# Patient Record
Sex: Female | Born: 1945 | Race: White | Hispanic: No | Marital: Married | Smoking: Never smoker
Health system: Southern US, Community
[De-identification: ages and names within clinical notes are randomized; demographics above are authoritative.]

## PROBLEM LIST (undated history)

## (undated) DIAGNOSIS — D649 Anemia, unspecified: Secondary | ICD-10-CM

## (undated) DIAGNOSIS — Z9889 Other specified postprocedural states: Secondary | ICD-10-CM

## (undated) DIAGNOSIS — Z9221 Personal history of antineoplastic chemotherapy: Secondary | ICD-10-CM

## (undated) DIAGNOSIS — Z8042 Family history of malignant neoplasm of prostate: Secondary | ICD-10-CM

## (undated) DIAGNOSIS — E785 Hyperlipidemia, unspecified: Secondary | ICD-10-CM

## (undated) DIAGNOSIS — I10 Essential (primary) hypertension: Secondary | ICD-10-CM

## (undated) DIAGNOSIS — Z923 Personal history of irradiation: Secondary | ICD-10-CM

## (undated) DIAGNOSIS — Z8049 Family history of malignant neoplasm of other genital organs: Secondary | ICD-10-CM

## (undated) DIAGNOSIS — R112 Nausea with vomiting, unspecified: Secondary | ICD-10-CM

## (undated) DIAGNOSIS — C801 Malignant (primary) neoplasm, unspecified: Secondary | ICD-10-CM

## (undated) HISTORY — PX: BREAST LUMPECTOMY: SHX2

## (undated) HISTORY — PX: DILATION AND CURETTAGE OF UTERUS: SHX78

## (undated) HISTORY — PX: BREAST BIOPSY: SHX20

## (undated) HISTORY — PX: MASTECTOMY: SHX3

## (undated) HISTORY — DX: Family history of malignant neoplasm of other genital organs: Z80.49

## (undated) HISTORY — DX: Family history of malignant neoplasm of prostate: Z80.42

---

## 2013-07-01 DIAGNOSIS — H04129 Dry eye syndrome of unspecified lacrimal gland: Secondary | ICD-10-CM | POA: Diagnosis not present

## 2013-07-01 DIAGNOSIS — H00029 Hordeolum internum unspecified eye, unspecified eyelid: Secondary | ICD-10-CM | POA: Diagnosis not present

## 2013-07-20 DIAGNOSIS — H04129 Dry eye syndrome of unspecified lacrimal gland: Secondary | ICD-10-CM | POA: Diagnosis not present

## 2013-07-20 DIAGNOSIS — H00029 Hordeolum internum unspecified eye, unspecified eyelid: Secondary | ICD-10-CM | POA: Diagnosis not present

## 2013-07-20 DIAGNOSIS — H1130 Conjunctival hemorrhage, unspecified eye: Secondary | ICD-10-CM | POA: Diagnosis not present

## 2013-07-27 DIAGNOSIS — H04129 Dry eye syndrome of unspecified lacrimal gland: Secondary | ICD-10-CM | POA: Diagnosis not present

## 2013-07-27 DIAGNOSIS — H1045 Other chronic allergic conjunctivitis: Secondary | ICD-10-CM | POA: Diagnosis not present

## 2013-07-27 DIAGNOSIS — H00029 Hordeolum internum unspecified eye, unspecified eyelid: Secondary | ICD-10-CM | POA: Diagnosis not present

## 2013-08-25 DIAGNOSIS — H04129 Dry eye syndrome of unspecified lacrimal gland: Secondary | ICD-10-CM | POA: Diagnosis not present

## 2013-08-25 DIAGNOSIS — H1045 Other chronic allergic conjunctivitis: Secondary | ICD-10-CM | POA: Diagnosis not present

## 2013-09-21 DIAGNOSIS — Z124 Encounter for screening for malignant neoplasm of cervix: Secondary | ICD-10-CM | POA: Diagnosis not present

## 2013-09-21 DIAGNOSIS — I10 Essential (primary) hypertension: Secondary | ICD-10-CM | POA: Diagnosis not present

## 2013-09-21 DIAGNOSIS — H5789 Other specified disorders of eye and adnexa: Secondary | ICD-10-CM | POA: Diagnosis not present

## 2013-09-21 DIAGNOSIS — E785 Hyperlipidemia, unspecified: Secondary | ICD-10-CM | POA: Diagnosis not present

## 2013-09-21 DIAGNOSIS — Z01419 Encounter for gynecological examination (general) (routine) without abnormal findings: Secondary | ICD-10-CM | POA: Diagnosis not present

## 2013-09-21 DIAGNOSIS — Z780 Asymptomatic menopausal state: Secondary | ICD-10-CM | POA: Diagnosis not present

## 2013-09-21 DIAGNOSIS — Z1231 Encounter for screening mammogram for malignant neoplasm of breast: Secondary | ICD-10-CM | POA: Diagnosis not present

## 2013-10-07 DIAGNOSIS — Z1212 Encounter for screening for malignant neoplasm of rectum: Secondary | ICD-10-CM | POA: Diagnosis not present

## 2013-11-06 DIAGNOSIS — Z780 Asymptomatic menopausal state: Secondary | ICD-10-CM | POA: Diagnosis not present

## 2013-11-06 DIAGNOSIS — Z1231 Encounter for screening mammogram for malignant neoplasm of breast: Secondary | ICD-10-CM | POA: Diagnosis not present

## 2013-11-06 DIAGNOSIS — H5789 Other specified disorders of eye and adnexa: Secondary | ICD-10-CM | POA: Diagnosis not present

## 2014-08-11 DIAGNOSIS — Z0000 Encounter for general adult medical examination without abnormal findings: Secondary | ICD-10-CM | POA: Diagnosis not present

## 2014-08-11 DIAGNOSIS — Z23 Encounter for immunization: Secondary | ICD-10-CM | POA: Diagnosis not present

## 2014-08-11 DIAGNOSIS — Z1159 Encounter for screening for other viral diseases: Secondary | ICD-10-CM | POA: Diagnosis not present

## 2014-08-11 DIAGNOSIS — I10 Essential (primary) hypertension: Secondary | ICD-10-CM | POA: Diagnosis not present

## 2014-08-11 DIAGNOSIS — E785 Hyperlipidemia, unspecified: Secondary | ICD-10-CM | POA: Diagnosis not present

## 2014-10-06 DIAGNOSIS — R202 Paresthesia of skin: Secondary | ICD-10-CM | POA: Diagnosis not present

## 2014-10-06 DIAGNOSIS — M5412 Radiculopathy, cervical region: Secondary | ICD-10-CM | POA: Diagnosis not present

## 2015-02-09 DIAGNOSIS — J301 Allergic rhinitis due to pollen: Secondary | ICD-10-CM | POA: Diagnosis not present

## 2015-02-09 DIAGNOSIS — I10 Essential (primary) hypertension: Secondary | ICD-10-CM | POA: Diagnosis not present

## 2015-02-09 DIAGNOSIS — E785 Hyperlipidemia, unspecified: Secondary | ICD-10-CM | POA: Diagnosis not present

## 2015-04-19 DIAGNOSIS — H524 Presbyopia: Secondary | ICD-10-CM | POA: Diagnosis not present

## 2015-04-19 DIAGNOSIS — H04123 Dry eye syndrome of bilateral lacrimal glands: Secondary | ICD-10-CM | POA: Diagnosis not present

## 2015-04-19 DIAGNOSIS — H35372 Puckering of macula, left eye: Secondary | ICD-10-CM | POA: Diagnosis not present

## 2015-04-19 DIAGNOSIS — Z961 Presence of intraocular lens: Secondary | ICD-10-CM | POA: Diagnosis not present

## 2015-08-12 DIAGNOSIS — J301 Allergic rhinitis due to pollen: Secondary | ICD-10-CM | POA: Diagnosis not present

## 2015-08-12 DIAGNOSIS — I10 Essential (primary) hypertension: Secondary | ICD-10-CM | POA: Diagnosis not present

## 2015-08-12 DIAGNOSIS — E785 Hyperlipidemia, unspecified: Secondary | ICD-10-CM | POA: Diagnosis not present

## 2016-02-20 DIAGNOSIS — Z23 Encounter for immunization: Secondary | ICD-10-CM | POA: Diagnosis not present

## 2016-02-20 DIAGNOSIS — E785 Hyperlipidemia, unspecified: Secondary | ICD-10-CM | POA: Diagnosis not present

## 2016-02-20 DIAGNOSIS — J301 Allergic rhinitis due to pollen: Secondary | ICD-10-CM | POA: Diagnosis not present

## 2016-02-20 DIAGNOSIS — I10 Essential (primary) hypertension: Secondary | ICD-10-CM | POA: Diagnosis not present

## 2016-02-20 DIAGNOSIS — R739 Hyperglycemia, unspecified: Secondary | ICD-10-CM | POA: Diagnosis not present

## 2016-02-20 DIAGNOSIS — Z1239 Encounter for other screening for malignant neoplasm of breast: Secondary | ICD-10-CM | POA: Diagnosis not present

## 2016-02-20 DIAGNOSIS — Z1211 Encounter for screening for malignant neoplasm of colon: Secondary | ICD-10-CM | POA: Diagnosis not present

## 2016-02-22 ENCOUNTER — Other Ambulatory Visit: Payer: Self-pay | Admitting: Family Medicine

## 2016-02-22 DIAGNOSIS — Z1231 Encounter for screening mammogram for malignant neoplasm of breast: Secondary | ICD-10-CM

## 2016-04-24 DIAGNOSIS — H35372 Puckering of macula, left eye: Secondary | ICD-10-CM | POA: Diagnosis not present

## 2016-04-24 DIAGNOSIS — H04123 Dry eye syndrome of bilateral lacrimal glands: Secondary | ICD-10-CM | POA: Diagnosis not present

## 2016-04-24 DIAGNOSIS — H43811 Vitreous degeneration, right eye: Secondary | ICD-10-CM | POA: Diagnosis not present

## 2016-04-24 DIAGNOSIS — Z961 Presence of intraocular lens: Secondary | ICD-10-CM | POA: Diagnosis not present

## 2016-05-02 ENCOUNTER — Ambulatory Visit
Admission: RE | Admit: 2016-05-02 | Discharge: 2016-05-02 | Disposition: A | Discharge status: Home / Self Care | Payer: Medicare Other | Source: Ambulatory Visit | Attending: Family Medicine | Admitting: Family Medicine

## 2016-05-02 DIAGNOSIS — Z1231 Encounter for screening mammogram for malignant neoplasm of breast: Secondary | ICD-10-CM | POA: Diagnosis not present

## 2016-07-26 DIAGNOSIS — Z1211 Encounter for screening for malignant neoplasm of colon: Secondary | ICD-10-CM | POA: Diagnosis not present

## 2016-08-14 DIAGNOSIS — Z0000 Encounter for general adult medical examination without abnormal findings: Secondary | ICD-10-CM | POA: Diagnosis not present

## 2016-08-14 DIAGNOSIS — R7303 Prediabetes: Secondary | ICD-10-CM | POA: Diagnosis not present

## 2016-08-14 DIAGNOSIS — I10 Essential (primary) hypertension: Secondary | ICD-10-CM | POA: Diagnosis not present

## 2016-08-14 DIAGNOSIS — J301 Allergic rhinitis due to pollen: Secondary | ICD-10-CM | POA: Diagnosis not present

## 2016-08-14 DIAGNOSIS — E7800 Pure hypercholesterolemia, unspecified: Secondary | ICD-10-CM | POA: Diagnosis not present

## 2017-02-13 DIAGNOSIS — I10 Essential (primary) hypertension: Secondary | ICD-10-CM | POA: Diagnosis not present

## 2017-02-13 DIAGNOSIS — R7303 Prediabetes: Secondary | ICD-10-CM | POA: Diagnosis not present

## 2017-02-13 DIAGNOSIS — E7800 Pure hypercholesterolemia, unspecified: Secondary | ICD-10-CM | POA: Diagnosis not present

## 2017-03-07 ENCOUNTER — Other Ambulatory Visit: Payer: Self-pay | Admitting: Family Medicine

## 2017-03-07 DIAGNOSIS — N6310 Unspecified lump in the right breast, unspecified quadrant: Secondary | ICD-10-CM | POA: Diagnosis not present

## 2017-03-07 DIAGNOSIS — N63 Unspecified lump in unspecified breast: Secondary | ICD-10-CM

## 2017-03-08 ENCOUNTER — Ambulatory Visit
Admission: RE | Admit: 2017-03-08 | Discharge: 2017-03-08 | Disposition: A | Discharge status: Home / Self Care | Payer: Medicare Other | Source: Ambulatory Visit | Attending: Family Medicine | Admitting: Family Medicine

## 2017-03-08 ENCOUNTER — Other Ambulatory Visit: Payer: Self-pay | Admitting: Family Medicine

## 2017-03-08 DIAGNOSIS — R928 Other abnormal and inconclusive findings on diagnostic imaging of breast: Secondary | ICD-10-CM

## 2017-03-08 DIAGNOSIS — N6489 Other specified disorders of breast: Secondary | ICD-10-CM | POA: Diagnosis not present

## 2017-03-08 DIAGNOSIS — N63 Unspecified lump in unspecified breast: Secondary | ICD-10-CM

## 2017-03-08 DIAGNOSIS — R922 Inconclusive mammogram: Secondary | ICD-10-CM | POA: Diagnosis not present

## 2017-04-24 ENCOUNTER — Ambulatory Visit
Admission: RE | Admit: 2017-04-24 | Discharge: 2017-04-24 | Disposition: A | Discharge status: Home / Self Care | Payer: Medicare Other | Source: Ambulatory Visit | Attending: Family Medicine | Admitting: Family Medicine

## 2017-04-24 ENCOUNTER — Other Ambulatory Visit: Payer: Self-pay | Admitting: Family Medicine

## 2017-04-24 DIAGNOSIS — C50411 Malignant neoplasm of upper-outer quadrant of right female breast: Secondary | ICD-10-CM | POA: Diagnosis not present

## 2017-04-24 DIAGNOSIS — N6489 Other specified disorders of breast: Secondary | ICD-10-CM | POA: Diagnosis not present

## 2017-04-24 DIAGNOSIS — R928 Other abnormal and inconclusive findings on diagnostic imaging of breast: Secondary | ICD-10-CM

## 2017-04-24 DIAGNOSIS — N6311 Unspecified lump in the right breast, upper outer quadrant: Secondary | ICD-10-CM | POA: Diagnosis not present

## 2017-04-25 DIAGNOSIS — C801 Malignant (primary) neoplasm, unspecified: Secondary | ICD-10-CM

## 2017-04-25 HISTORY — DX: Malignant (primary) neoplasm, unspecified: C80.1

## 2017-04-26 DIAGNOSIS — Z961 Presence of intraocular lens: Secondary | ICD-10-CM | POA: Diagnosis not present

## 2017-04-26 DIAGNOSIS — H43811 Vitreous degeneration, right eye: Secondary | ICD-10-CM | POA: Diagnosis not present

## 2017-04-26 DIAGNOSIS — H35361 Drusen (degenerative) of macula, right eye: Secondary | ICD-10-CM | POA: Diagnosis not present

## 2017-04-26 DIAGNOSIS — H35372 Puckering of macula, left eye: Secondary | ICD-10-CM | POA: Diagnosis not present

## 2017-04-29 ENCOUNTER — Other Ambulatory Visit: Payer: Self-pay | Admitting: Surgery

## 2017-04-29 DIAGNOSIS — C50919 Malignant neoplasm of unspecified site of unspecified female breast: Secondary | ICD-10-CM | POA: Diagnosis not present

## 2017-04-29 DIAGNOSIS — C50111 Malignant neoplasm of central portion of right female breast: Secondary | ICD-10-CM

## 2017-05-02 ENCOUNTER — Other Ambulatory Visit: Payer: Self-pay | Admitting: Surgery

## 2017-05-02 ENCOUNTER — Ambulatory Visit
Admission: RE | Admit: 2017-05-02 | Discharge: 2017-05-02 | Disposition: A | Discharge status: Home / Self Care | Payer: Medicare Other | Source: Ambulatory Visit | Attending: Surgery | Admitting: Surgery

## 2017-05-02 DIAGNOSIS — Z853 Personal history of malignant neoplasm of breast: Secondary | ICD-10-CM

## 2017-05-02 DIAGNOSIS — C50111 Malignant neoplasm of central portion of right female breast: Secondary | ICD-10-CM

## 2017-05-02 DIAGNOSIS — D0501 Lobular carcinoma in situ of right breast: Secondary | ICD-10-CM | POA: Diagnosis not present

## 2017-05-02 MED ORDER — GADOBENATE DIMEGLUMINE 529 MG/ML IV SOLN
9.0000 mL | Freq: Once | INTRAVENOUS | Status: AC | PRN
  Administered 2017-05-02: 9 mL via INTRAVENOUS

## 2017-05-03 ENCOUNTER — Ambulatory Visit
Admission: RE | Admit: 2017-05-03 | Discharge: 2017-05-03 | Disposition: A | Discharge status: Home / Self Care | Payer: Medicare Other | Source: Ambulatory Visit | Attending: Surgery | Admitting: Surgery

## 2017-05-03 ENCOUNTER — Other Ambulatory Visit: Payer: Self-pay | Admitting: Surgery

## 2017-05-03 DIAGNOSIS — Z853 Personal history of malignant neoplasm of breast: Secondary | ICD-10-CM

## 2017-05-03 DIAGNOSIS — N6489 Other specified disorders of breast: Secondary | ICD-10-CM | POA: Diagnosis not present

## 2017-05-03 DIAGNOSIS — C50512 Malignant neoplasm of lower-outer quadrant of left female breast: Secondary | ICD-10-CM | POA: Diagnosis not present

## 2017-05-03 DIAGNOSIS — N6320 Unspecified lump in the left breast, unspecified quadrant: Secondary | ICD-10-CM

## 2017-05-03 DIAGNOSIS — C50911 Malignant neoplasm of unspecified site of right female breast: Secondary | ICD-10-CM | POA: Diagnosis not present

## 2017-05-03 DIAGNOSIS — R922 Inconclusive mammogram: Secondary | ICD-10-CM | POA: Diagnosis not present

## 2017-05-07 DIAGNOSIS — C50811 Malignant neoplasm of overlapping sites of right female breast: Secondary | ICD-10-CM | POA: Insufficient documentation

## 2017-05-07 DIAGNOSIS — C50112 Malignant neoplasm of central portion of left female breast: Secondary | ICD-10-CM | POA: Insufficient documentation

## 2017-05-08 ENCOUNTER — Ambulatory Visit (HOSPITAL_BASED_OUTPATIENT_CLINIC_OR_DEPARTMENT_OTHER): Payer: Medicare Other | Admitting: Nurse Practitioner

## 2017-05-08 ENCOUNTER — Encounter: Payer: Self-pay | Admitting: Nurse Practitioner

## 2017-05-08 ENCOUNTER — Encounter: Payer: Self-pay | Admitting: *Deleted

## 2017-05-08 VITALS — BP 153/94 | HR 70 | Temp 97.9°F | Resp 18 | Ht 62.0 in | Wt 126.6 lb

## 2017-05-08 DIAGNOSIS — C50411 Malignant neoplasm of upper-outer quadrant of right female breast: Secondary | ICD-10-CM | POA: Diagnosis not present

## 2017-05-08 DIAGNOSIS — C50112 Malignant neoplasm of central portion of left female breast: Secondary | ICD-10-CM

## 2017-05-08 DIAGNOSIS — I10 Essential (primary) hypertension: Secondary | ICD-10-CM | POA: Diagnosis not present

## 2017-05-08 DIAGNOSIS — Z170 Estrogen receptor positive status [ER+]: Secondary | ICD-10-CM

## 2017-05-08 DIAGNOSIS — Z8042 Family history of malignant neoplasm of prostate: Secondary | ICD-10-CM

## 2017-05-08 DIAGNOSIS — Z808 Family history of malignant neoplasm of other organs or systems: Secondary | ICD-10-CM | POA: Diagnosis not present

## 2017-05-08 DIAGNOSIS — C50811 Malignant neoplasm of overlapping sites of right female breast: Secondary | ICD-10-CM

## 2017-05-08 NOTE — Progress Notes (Addendum)
[place]  [phone] Fax:([phone]) [place] Note   Patient Care Team: [name], MD as PCP - General (Family Medicine) [name], MD as Consulting Physician (Hematology) [name], NP as Nurse Practitioner (Nurse Practitioner) [name], MD as Consulting Physician (General Surgery) 05/08/2017  REFERRAL PHYSICIAN: Dr. Georgette Dover  CHIEF COMPLAINTS/PURPOSE OF CONSULTATION:  Bilateral breast cancer    Cancer of overlapping sites of right female breast ([place])   03/08/2017 Mammogram    IMPRESSION: 1. Patient has a is palpable mass in the lateral right breast, in the location dense fibroglandular breast tissue. Although no discrete mass is seen sonographically or mammographically, the Clinical change remains concerning. Biopsy is recommended.  RECOMMENDATION: 1. Ultrasound-guided core needle biopsy of palpable abnormality in the lateral right breast.      04/24/2017 Breast US    IMPRESSION: Ultrasound guided biopsy of the right breast. No apparent complications.      04/24/2017 Initial Biopsy    Breast, right, needle core biopsy, UOQ, centered at 9:30 o'clock - INVASIVE MAMMARY CARCINOMA. - SEE COMMENT. ER 100% positive PR 100% positive Ki67 10% HER2 positive      05/02/2017 Breast MRI    IMPRESSION: 1. 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 2. 4 mm enhancing mass with associated architectural distortion in the central left breast. This is suspicious for additional focus of carcinoma.      05/07/2017 Initial Diagnosis    Cancer of overlapping sites of right female breast [facility])       Cancer of central portion of left breast ([place])   05/02/2017 Breast MRI    IMPRESSION: 1. 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 2. 4 mm enhancing mass with associated  architectural distortion in the central left breast. This is suspicious for additional focus of carcinoma.      05/03/2017 Mammogram    IMPRESSION: 1. Suspicious area of architectural distortion in the left breast which corresponds to a small enhancing mass on MRI.  RECOMMENDATION: Stereotactic core needle biopsy of the area of architectural distortion in the left breast.       05/03/2017 Breast US    Stereotactic core needle biopsy of the area of architectural distortion in the left breast.      05/03/2017 Initial Biopsy    Breast, left, needle core biopsy, central, slightly toward the lower, outer quadrant - INVASIVE DUCTAL CARCINOMA, MSBR GRADE 1/2. - SEE MICROSCOPIC DESCRIPTION. ER 50% positive PR 90% positive Ki67: 1%      05/07/2017 Initial Diagnosis    Cancer of central portion of left breast ([place])      HISTORY OF PRESENTING ILLNESS:  Celsey Asselin 71 y.o. female is here because of newly diagnosed bilateral breast cancer. She was referred by Dr. Georgette Dover. She presents to clinic accompanied by her daughter in law and husband. She noticed a right breast mass in early September 2018, she underwent diagnostic right mammogram with ultrasound on 03/08/17; no discrete mass was seen sonographically or mammographically. She underwent right breast biopsy with flip placement on 04/24/17, found to be invasive mammary carcinoma, favoring lobular type. She was referred to Dr. Georgette Dover who then ordered bilateral breast MRI. Imaging identified non mass enhancement spanning 5.9 x 3.2 x 5.6 cm in the right breast as well as a small enhancing 4 mm mass in the  central left breast with a small hypoechoic focus. She then underwent diagnostic mammogram with [place] and biopsy in the left breast. Pathology revealed invasive ductal carcinoma. Her last mammogram was 04/2016, no abnormal mammogram or previous breast biopsy. She has not noticed associated breast changes, nipple discharge or inversion, or skin  changes. No weight loss, decreased appetite, or fatigue.   She has no significant past medical history, she is on blood pressure and cholesterol medication. She is retired Network engineer, lives with her husband; has 2 adult sons. No tobacco or alcohol history. She reports having a cold lately she caught from her grandson but otherwise feels well. Occasionally has urgent BM after meals, denies abd fullness, pain, early satiety, nausea or vomiting.   GYN HISTORY  Menarchal: 46 LMP: age 42, menopause  Contraceptive:  HRT: None GP: 2 pregnancies, 2 births    SOCIAL HISTORY: Social History   Socioeconomic History  . Marital status: Married    Spouse name: Not on file  . Number of children: Not on file  . Years of education: Not on file  . Highest education level: Not on file  Social Needs  . Financial resource strain: Not on file  . Food insecurity - worry: Not on file  . Food insecurity - inability: Not on file  . Transportation needs - medical: Not on file  . Transportation needs - non-medical: Not on file  Occupational History  . Not on file  Tobacco Use  . Smoking status: Never Smoker  . Smokeless tobacco: Never Used  Substance and Sexual Activity  . Alcohol use: Yes    Comment: 1 glass of wine per month  . Drug use: No  . Sexual activity: Not on file  Other Topics Concern  . Not on file  Social History Narrative  . Not on file    FAMILY HISTORY: Family History  Problem Relation Age of Onset  . Cancer Mother 65       uterine  . Cancer Brother        prostate    ALLERGIES:  has No Known Allergies.  MEDICATIONS:  Current Outpatient Medications  Medication Sig Dispense Refill  . aspirin EC 81 MG tablet Take by mouth.    . irbesartan (AVAPRO) 150 MG tablet     . Multiple Vitamin (MULTIVITAMIN) tablet Take 1 tablet daily by mouth.    . simvastatin (ZOCOR) 40 MG tablet Take 40 mg at bedtime by mouth.     No current facility-administered medications for this visit.       REVIEW OF SYSTEMS:   Constitutional: Denies fatigue, fevers, chills or abnormal night sweats Eyes: Denies blurriness of vision, double vision or watery eyes Ears, nose, mouth, throat, and face: Denies mucositis or sore throat (+) nasal congestion, resolving Respiratory: Denies dyspnea or wheezes (+) cough, resolving   Cardiovascular: Denies palpitation, chest discomfort or lower extremity swelling Gastrointestinal:  Denies nausea, vomiting, constipation, diarrhea, heartburn, abd pain or fullness, or early satiety (+) occasional urgent BM after meals Skin: Denies abnormal skin rashes Lymphatics: Denies new lymphadenopathy or easy bruising Neurological:Denies numbness, tingling or new weaknesses Behavioral/Psych: Mood is stable, no new changes  All other systems were reviewed with the patient and are negative.  PHYSICAL EXAMINATION: ECOG PERFORMANCE STATUS: 0 - Asymptomatic  Vitals:   05/08/17 1406 05/08/17 1407  BP: (!) 162/83 (!) 153/94  Pulse: 70   Resp: 18   Temp: 97.9 F (36.6 C)   SpO2: 97%    Filed Weights  05/08/17 1406  Weight: 126 lb 9.6 oz (57.4 kg)   CBC Latest Ref Rng & Units 05/13/2017  WBC 3.9 - 10.3 10e3/uL 5.4  Hemoglobin 11.6 - 15.9 g/dL 14.9  Hematocrit 34.8 - 46.6 % 45.2  Platelets 145 - 400 10e3/uL 241   CMP Latest Ref Rng & Units 05/13/2017  Glucose 70 - 140 mg/dl 95  BUN 7.0 - 26.0 mg/dL 17.7  Creatinine 0.6 - 1.1 mg/dL 0.8  Sodium 136 - 145 mEq/L 141  Potassium 3.5 - 5.1 mEq/L 4.3  CO2 22 - 29 mEq/L 26  Calcium 8.4 - 10.4 mg/dL 9.6  Total Protein 6.4 - 8.3 g/dL 7.5  Total Bilirubin 0.20 - 1.20 mg/dL 0.47  Alkaline Phos 40 - 150 U/L 98  AST 5 - 34 U/L 28  ALT 0 - 55 U/L 32   CA 27.29 05/13/17: 59.5  GENERAL:alert, no distress and comfortable SKIN: skin color, texture, turgor are normal, no rashes or significant lesions EYES: normal, conjunctiva are pink and non-injected, sclera clear OROPHARYNX:no exudate, no erythema and lips, buccal  mucosa, and tongue normal  NECK: supple, thyroid normal size, non-tender, without nodularity LYMPH:  no palpable cervical, supraclavicular, axillary, or inguinal lymphadenopathy  LUNGS: clear to auscultation bilaterally with normal breathing effort HEART: regular rate & rhythm and no murmurs and no lower extremity edema ABDOMEN:abdomen soft, non-tender and normal bowel sounds. No palpable hepatomegaly  Musculoskeletal:no cyanosis of digits and no clubbing  PSYCH: alert & oriented x 3 with fluent speech NEURO: no focal motor/sensory deficits BREASTS: inspection shows them to be symmetrical without nipple discharge or inversion. Right breast with 5 cm palpable slightly tender mobile mass to upper outer breast with ecchymosis. Left breast with 1 cm palpable mass to lower central breast, nontender, with ecchymosis   Diagnosis 05/03/17  Breast, left, needle core biopsy, central, slightly toward the lower, outer quadrant - INVASIVE DUCTAL CARCINOMA, MSBR GRADE 1/2. - SEE MICROSCOPIC DESCRIPTION. ER 50% positive PR 90% positive Ki67 1% Microscopic Comment Breast prognostic profile will be performed. Called to [facility] on 05/06/2017.   Diagnosis 04/24/17  Breast, right, needle core biopsy, UOQ, centered at 9:30 o'clock - INVASIVE MAMMARY CARCINOMA. - SEE COMMENT. Microscopic Comment The carcinoma appears grade II. An E-cadherin and a breast prognostic profile will be performed and the results reported separately. The results were called to [facility] on 04/25/2017. (JBK:ecj 04/25/2017) Results: IMMUNOHISTOCHEMICAL AND MORPHOMETRIC ANALYSIS PERFORMED MANUALLY Estrogen Receptor: 100%, POSITIVE, STRONG STAINING INTENSITY Progesterone Receptor: 100%, POSITIVE, STRONG STAINING INTENSITY Proliferation Marker Ki67: 10% HER2: **POSITIVE**  RADIOGRAPHIC STUDIES: I have personally reviewed the radiological images as listed and agreed with the findings in  the report.  Diagnostic Mammogram Bilateral 05/03/17  IMPRESSION: 1. 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 2. 4 mm enhancing mass with associated architectural distortion in the central left breast. This is suspicious for additional focus of Carcinoma.   Diagnostic Mammogram, right 03/08/17 IMPRESSION: 1. Patient has a is palpable mass in the 9:30 position in the lateral right breast 2 cm from the nipple, in the location dense fibroglandular breast tissue. Although no discrete mass is seen sonographically or mammographically, the Clinical change remains concerning. Biopsy is recommended.   RADIOGRAPHIC STUDIES: I have personally reviewed the radiological images as listed and agreed with the findings in the report. Mr Breast Bilateral W Wo Contrast  Result Date: 05/03/2017 CLINICAL DATA:  Patient presents for evaluation of extent of disease. Patient presented with a palpable right breast mass which had no mammographic correlate on diagnostic imaging, and if vague hypoechoic area corresponding to the palpable abnormality on ultrasound. She underwent biopsy under ultrasound guidance which revealed invasive lobular carcinoma. LABS:  Creatinine was obtained on site at [facility] at [address] Results: Creatinine 0.7 mg/dL.  GFR 83. EXAM: BILATERAL BREAST MRI WITH AND WITHOUT CONTRAST TECHNIQUE: Multiplanar, multisequence MR images of both breasts were obtained prior to and following the intravenous administration of 9 ml of MultiHance. THREE-DIMENSIONAL MR IMAGE RENDERING ON INDEPENDENT WORKSTATION: Three-dimensional MR images were rendered by post-processing of the original MR data on an independent workstation. The three-dimensional MR images were interpreted, and findings are reported in the following complete MRI report for this study. Three dimensional images were evaluated at the independent  D[employer] workstation COMPARISON:  Previous exam(s). FINDINGS: Breast composition: c. Heterogeneous fibroglandular tissue. Background parenchymal enhancement: Minimal Right breast: There is non mass enhancement throughout a significant portion of the upper outer and lower outer quadrants of the right breast, spanning 5.9 x 3.2 x 5.6 cm. No abnormal enhancement is seen medial to midline. Susceptibility artifact is present along the anterior aspect of the abnormal enhancement reflecting the post biopsy clip. Left breast: There is a small enhancing mass measuring 4 mm in the central left breast, just below midline, with a small hypoechoic focus and evidence of associated architectural distortion on the T1 weighted imaging. There are no other areas of abnormal enhancement within the left breast. Lymph nodes: No abnormal appearing lymph nodes. Ancillary findings:  None. IMPRESSION: 1. 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 2. 4 mm enhancing mass with associated architectural distortion in the central left breast. This is suspicious for additional focus of carcinoma. RECOMMENDATION: 1. Patient has not had a left breast mammogram since 05/02/2016. Recommend follow-up diagnostic left breast mammography and possible ultrasound. If this imaging demonstrates an abnormality corresponding to the enhancing mass on MRI, then the biopsy using that modality would be recommended. If no corresponding abnormality can be seen on mammography or ultrasound, then MRI guided biopsy would be recommended. BI-RADS CATEGORY  4: Suspicious. Electronically Signed   By: Lajean Manes M.D.   On: 05/03/2017 10:39   US Breast Ltd Uni Left Inc Axilla  Result Date: 05/03/2017 CLINICAL DATA:  This patient has known lobular carcinoma of the right breast, recently biopsied under ultrasound guidance. She underwent breast MRI yesterday, which demonstrated a right breast  lobular carcinoma, but also showed a 4 mm enhancing mass in the central left breast associated with a subtle architectural distortion on T1 weighted images. She returns for diagnostic left breast mammography (last left breast mammogram performed on 05/02/2016) and possible ultrasound for further evaluation. EXAM: 2D DIGITAL DIAGNOSTIC LEFT MAMMOGRAM WITH CAD AND ADJUNCT TOMO ULTRASOUND LEFT BREAST COMPARISON:  Previous exam(s). ACR Breast Density Category c: The breast tissue is heterogeneously dense, which may obscure small masses. FINDINGS: On the 3D images, there is a small focus of architectural distortion in the central breast, slightly lateral to midline and slightly below the level of the nipple, which corresponds to the small enhancing mass on MRI. There is a questionable small mass in this location on the MLO view with less well-defined architectural distortion. There is no other evidence of a mass and no other areas of architectural distortion. No suspicious calcifications. Mammographic  images were processed with CAD. On physical exam, no mass is palpated in the retroareolar left breast. Targeted ultrasound is performed, showing normal heterogeneous fibroglandular tissue with no discrete masses or sonographic areas of architectural distortion. Sonographic evaluation of the left axilla shows no enlarged or abnormal lymph nodes. IMPRESSION: 1. Suspicious area of architectural distortion in the left breast which corresponds to a small enhancing mass on MRI. RECOMMENDATION: Stereotactic core needle biopsy of the area of architectural distortion in the left breast. I have discussed the findings and recommendations with the patient. Results were also provided in writing at the conclusion of the visit. If applicable, a reminder letter will be sent to the patient regarding the next appointment. BI-RADS CATEGORY  4: Suspicious. Electronically Signed   By: Lajean Manes M.D.   On: 05/03/2017 10:06   Mm Diag Breast  Tomo Uni Left  Result Date: 05/03/2017 CLINICAL DATA:  This patient has known lobular carcinoma of the right breast, recently biopsied under ultrasound guidance. She underwent breast MRI yesterday, which demonstrated a right breast lobular carcinoma, but also showed a 4 mm enhancing mass in the central left breast associated with a subtle architectural distortion on T1 weighted images. She returns for diagnostic left breast mammography (last left breast mammogram performed on 05/02/2016) and possible ultrasound for further evaluation. EXAM: 2D DIGITAL DIAGNOSTIC LEFT MAMMOGRAM WITH CAD AND ADJUNCT TOMO ULTRASOUND LEFT BREAST COMPARISON:  Previous exam(s). ACR Breast Density Category c: The breast tissue is heterogeneously dense, which may obscure small masses. FINDINGS: On the 3D images, there is a small focus of architectural distortion in the central breast, slightly lateral to midline and slightly below the level of the nipple, which corresponds to the small enhancing mass on MRI. There is a questionable small mass in this location on the MLO view with less well-defined architectural distortion. There is no other evidence of a mass and no other areas of architectural distortion. No suspicious calcifications. Mammographic images were processed with CAD. On physical exam, no mass is palpated in the retroareolar left breast. Targeted ultrasound is performed, showing normal heterogeneous fibroglandular tissue with no discrete masses or sonographic areas of architectural distortion. Sonographic evaluation of the left axilla shows no enlarged or abnormal lymph nodes. IMPRESSION: 1. Suspicious area of architectural distortion in the left breast which corresponds to a small enhancing mass on MRI. RECOMMENDATION: Stereotactic core needle biopsy of the area of architectural distortion in the left breast. I have discussed the findings and recommendations with the patient. Results were also provided in writing at the  conclusion of the visit. If applicable, a reminder letter will be sent to the patient regarding the next appointment. BI-RADS CATEGORY  4: Suspicious. Electronically Signed   By: Lajean Manes M.D.   On: 05/03/2017 10:06   Mm Clip Placement Left  Result Date: 05/03/2017 CLINICAL DATA:  Status post stereotactic core needle biopsy of an area of architectural distortion in the left breast is EXAM: DIAGNOSTIC LEFT MAMMOGRAM POST STEREOTACTIC BIOPSY COMPARISON:  Previous exam(s). FINDINGS: Mammographic images were obtained following stereotactic guided biopsy of a left breast architectural distortion. The coil shaped biopsy clip lies inferior, approximately 2-2.5 cm, to the area of distortion on the MLO view and 5 mm posterior to the distortion on the CC view. IMPRESSION: Clip films following stereotactic core needle biopsy of the left breast. Coil shaped clip is displaced inferiorly and slightly posteriorly as detailed above. Final Assessment: Post Procedure Mammograms for Marker Placement Electronically Signed   By: Shanon Brow  Ormond M.D.   On: 05/03/2017 10:56   Mm Clip Placement Right  Result Date: 04/24/2017 CLINICAL DATA:  Status post ultrasound-guided core needle biopsy of a palpable lump in the upper outer right breast. EXAM: DIAGNOSTIC RIGHT MAMMOGRAM POST ULTRASOUND BIOPSY COMPARISON:  Previous exam(s). FINDINGS: Mammographic images were obtained following ultrasound guided biopsy of the palpable lump in the upper outer quadrant, centered at the 9:30 o'clock position, which corresponding ill-defined hypoechogenicity. The ribbon shaped biopsy clip lies within the central area of fibroglandular density in the upper outer right breast. IMPRESSION: Well-positioned ribbon shaped biopsy clip following ultrasound-guided core needle biopsy of the right breast. Final Assessment: Post Procedure Mammograms for Marker Placement Electronically Signed   By: Lajean Manes M.D.   On: 04/24/2017 13:32   Mm Lt Breast Bx W  Loc Dev 1st Lesion Image Bx Spec Stereo Guide  Addendum Date: 05/06/2017   ADDENDUM REPORT: 05/06/2017 14:24 ADDENDUM: Pathology revealed GRADE I-II INVASIVE DUCTAL CARCINOMA of the Left breast, central, slightly toward the lower, outer quadrant. This was found to be concordant by Dr. Lajean Manes. Pathology results were discussed with the patient by telephone. The patient reported doing well after the biopsy with tenderness at the site. Post biopsy instructions and care were reviewed and questions were answered. The patient was encouraged to call [facility] for any additional concerns. The patient has a recent diagnosis of right breast cancer and should follow her outlined treatment plan. Pathology results were sent to Dr. [name] via [facility] message. Pathology results reported by Terie Purser, RN on 05/06/2017. Electronically Signed   By: Lajean Manes M.D.   On: 05/06/2017 14:24   Result Date: 05/06/2017 CLINICAL DATA:  Patient presents for stereotactic core needle biopsy of a small focus of architectural distortion in the left breast centrally, slightly towards the lower outer quadrant. This corresponds to a small enhancing mass on MRI. The patient has known lobular carcinoma of the right breast. EXAM: LEFT BREAST STEREOTACTIC CORE NEEDLE BIOPSY COMPARISON:  Previous exams. FINDINGS: The patient and I discussed the procedure of stereotactic-guided biopsy including benefits and alternatives. We discussed the high likelihood of a successful procedure. We discussed the risks of the procedure including infection, bleeding, tissue injury, clip migration, and inadequate sampling. Informed written consent was given. The usual time out protocol was performed immediately prior to the procedure. Using sterile technique and 1% Lidocaine as local anesthetic, under stereotactic guidance, a 9 gauge vacuum assisted device was used to perform core needle biopsy of the area of architectural  distortion in the left breast using a superior approach. Lesion quadrant: Lower outer quadrant At the conclusion of the procedure, a coil shaped tissue marker clip was deployed into the biopsy cavity. Follow-up 2-view mammogram was performed and dictated separately. IMPRESSION: Stereotactic-guided biopsy of the left breast. No apparent complications. Electronically Signed: By: Lajean Manes M.D. On: 05/03/2017 10:47   [place] Rt Breast Bx W Loc Dev 1st Lesion Img Bx Spec US Guide  Addendum Date: 04/25/2017   ADDENDUM REPORT: 04/25/2017 14:36 ADDENDUM: Pathology revealed GRADE II INVASIVE MAMMARY CARCINOMA of the Right breast, upper outer quadrant, centered at 9:30 o'clock. This was found to be concordant by Dr. Lajean Manes. Pathology results were discussed with the patient by telephone. The patient reported doing well after the biopsy with tenderness at the site. Post biopsy instructions and care were reviewed and questions were answered. The patient was encouraged to call [facility] for  any additional concerns. Surgical consultation has been arranged with Dr. [name] at [facility] on April 29, 2017. Pathology results reported by Terie Purser, RN on 04/25/2017. Electronically Signed   By: Lajean Manes M.D.   On: 04/25/2017 14:36   Result Date: 04/25/2017 CLINICAL DATA:  Patient presents for ultrasound-guided core needle biopsy of a new palpable lump in the lateral to upper outer aspect of the right breast. On the diagnostic images, there was an ill-defined area of hypoechogenicity corresponding to the palpable lump. EXAM: ULTRASOUND GUIDED RIGHT BREAST CORE NEEDLE BIOPSY COMPARISON:  Previous exam(s). FINDINGS: I met with the patient and we discussed the procedure of ultrasound-guided biopsy, including benefits and alternatives. We discussed the high likelihood of a successful procedure. We discussed the risks of the procedure, including infection, bleeding,  tissue injury, clip migration, and inadequate sampling. Informed written consent was given. The usual time-out protocol was performed immediately prior to the procedure. Lesion quadrant: Upper outer quadrant Using sterile technique and 1% Lidocaine as local anesthetic, under direct ultrasound visualization, a 12 gauge spring-loaded device was used to perform biopsy of the palpable lump and corresponding ill-defined area of hypoechogenicity in the upper outer quadrant, centered at 9:30 o'clock, using an inferior approach. At the conclusion of the procedure a ribbon shaped tissue marker clip was deployed into the biopsy cavity. Follow up 2 view mammogram was performed and dictated separately. IMPRESSION: Ultrasound guided biopsy of the right breast. No apparent complications. Electronically Signed: By: Lajean Manes M.D. On: 04/24/2017 13:22    ASSESSMENT & PLAN: 71 year old caucasian postmenopausal woman   1.  Cancer of overlapping sites of right breast, invasive lobular carcinoma, Stage IB cT3, cN0, cM0, G2; ER positive, PR positive, HER2 positive  2.  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 HTN   We reviewed imaging and pathology results in detail. In her right breast, she has large area of disease that is HER2 positive. She is healthy and would be a good candidate for neoadjuvant chemotherapy. However, she has lobular carcinoma, which is less responsive to chemotherapy. Side effects including but not not limited to fatigue, nausea, vomiting, diarrhea, hair loss, neuropathy, fluid retention, renal and kidney dysfunction, neutropenic fever, need for blood transfusion, bleeding were discussed with patient in great detail. She was given printed material for her recommended treatment plan, which includes taxol, carboplatin, herceptin, and perjeta q3 weeks x6 cycles. She will then complete maintenance  herceptin x3 weeks for a total of 1 year. She agrees to proceed. We will monitor her response to chemo with breast exam during chemotherapy, if she does not appear to be responding clinically, will get interval imaging and she may need to proceed to surgery. Due to her ER/PR positive breast cancers, she is a candidate for adjuvant anti-estrogen therapy after surgery, we reviewed possible side effects and she is agreeable. Due to the large size and aggressive nature of her disease, she is a candidate for adjuvant radiation, she is interested. Will draw baseline CA 27.29 tumor marker, if elevated we will monitor throughout chemotherapy, if not elevated we will not monitor. We discussed pre-medication with steroids prior to chemo and PRN anti-emetics, will call in to her pharmacy with EMLA cream.   We will request PAC placement from Dr. Georgette Dover; she will attend chemotherapy education class. Iris Pert, RN breast navigator came to visit patient after apt today.  She will coordinate baseline echo, cardiology referral, and upcoming appointments for staging work up. This will occur over next 1-2 weeks. We anticipate beginning chemotherapy in 2 weeks.   I reached out to genetics counselor to see if patient qualifies for genetics referral, she has synchronous bilateral ductal and lobular carcinoma, her brother has prostate cancer and her mother had uterine cancer in her 56's. I will follow up.   PLAN: Lab, chemo class, echo, cardiology consult, CT CAP, bone scan in 1-2 weeks; orders placed today Discuss consult with Dr. Georgette Dover Return in 2 weeks for f/u and 1st cycle TCHP   Orders Placed This Encounter  Procedures  . CT Abdomen Pelvis W Contrast    Standing Status:   Future    Standing Expiration Date:   05/08/2018    Order Specific Question:   If indicated for the ordered procedure, I authorize the administration of contrast media per Radiology protocol    Answer:   Yes    Order Specific Question:    Preferred imaging location?    Answer:   [facility]    Order Specific Question:   Radiology Contrast Protocol - do NOT remove file path    Answer:   [email or web address]    Order Specific Question:   Reason for Exam additional comments    Answer:   bilateral breast cancer, staging work up  . CT Chest W Contrast    Standing Status:   Future    Standing Expiration Date:   05/08/2018    Order Specific Question:   If indicated for the ordered procedure, I authorize the administration of contrast media per Radiology protocol    Answer:   Yes    Order Specific Question:   Preferred imaging location?    Answer:   [facility]    Order Specific Question:   Radiology Contrast Protocol - do NOT remove file path    Answer:   [email or web address]    Order Specific Question:   Reason for Exam additional comments    Answer:   bilateral breast cancer, staging work up  . NM Bone Scan Whole Body    Standing Status:   Future    Standing Expiration Date:   05/08/2018    Order Specific Question:   If indicated for the ordered procedure, I authorize the administration of a radiopharmaceutical per Radiology protocol    Answer:   Yes    Order Specific Question:   Preferred imaging location?    Answer:   [facility]    Order Specific Question:   Radiology Contrast Protocol - do NOT remove file path    Answer:   [email or web address]    Order Specific Question:   Reason for Exam additional comments    Answer:   bilateral breast cancer, staging work up  . CBC with Differential    Standing Status:   Standing    Number of Occurrences:   100    Standing Expiration Date:   05/08/2018  . Comprehensive metabolic panel    Standing Status:   Standing    Number of Occurrences:   100    Standing Expiration Date:   05/08/2018  . CA 27.29    Standing Status:   Future    Standing Expiration Date:   05/08/2018  .  Ambulatory referral to Cardiology    Referral Priority:   Routine    Referral Type:   Consultation    Referral Reason:   Specialty Services Required  Requested Specialty:   Cardiology    Number of Visits Requested:   1  . ECHOCARDIOGRAM COMPLETE    Standing Status:   Future    Standing Expiration Date:   08/08/2018    Order Specific Question:   Where should this test be performed    Answer:   [place]    Order Specific Question:   Perflutren DEFINITY (image enhancing agent) should be administered unless hypersensitivity or allergy exist    Answer:   Administer Perflutren    Order Specific Question:   Expected Date:    Answer:   1 week    All questions were answered. The patient knows to call the clinic with any problems, questions or concerns.  Cira Rue NP  Addendum  Mrs. Bambach is a lovely 71 year old Caucasian female, presented with palpable right breast mass.  Initial mammogram and ultrasound was negative, MRI showed a large 5.9 cm mass in the right breast, biopsy showed triple positive invasive lobular carcinoma.  Her MRI also showed a small mass in the left breast, biopsy confirmed ER PR positive and HER-2 negative invasive ductal carcinoma.  She is otherwise healthy and active.  Due to the high risk of recurrence from right breast cancer, she would benefit from neoadjuvant or adjuvant chemotherapy.  I recommend neoadjuvant chemotherapy to shrink her right breast cancer, to see if lumpectomy would be more feasible after neoadjuvant chemotherapy TCH P every 3 weeks for 6 cycles, followed by maintenance Herceptin with or without pejeta to complete 1 year therapy.  The goal of therapy is curative.  Discussed the benefits and potential side effects from chemo, she is agreeable. I will discuss with her surgeon Dr. Georgette Dover.  We also reviewed that lobular carcinoma is less sensitive to chemotherapy, will monitor her disease closely during the chemo, and likely get a interim scan to  confirm her response to chemo.   [name], MD 05/08/2017 5:37 PM  Addendum: note was edited on 05/14/18 to reflect lab results ordered during this consult including CBC, Cmet, and CA 27.29. No changes were made to the plan of care.   Cira Rue, NP 05/14/18

## 2017-05-08 NOTE — Patient Instructions (Addendum)
Paclitaxel injection What is this medicine? PACLITAXEL (PAK li TAX el) is a chemotherapy drug. It targets fast dividing cells, like cancer cells, and causes these cells to die. This medicine is used to treat ovarian cancer, breast cancer, and other cancers. This medicine may be used for other purposes; ask your health care provider or pharmacist if you have questions. COMMON BRAND NAME(S): Onxol, Taxol What should I tell my health care provider before I take this medicine? They need to know if you have any of these conditions: -blood disorders -irregular heartbeat -infection (especially a virus infection such as chickenpox, cold sores, or herpes) -liver disease -previous or ongoing radiation therapy -an unusual or allergic reaction to paclitaxel, alcohol, polyoxyethylated castor oil, other chemotherapy agents, other medicines, foods, dyes, or preservatives -pregnant or trying to get pregnant -breast-feeding How should I use this medicine? This drug is given as an infusion into a vein. It is administered in a hospital or clinic by a specially trained health care professional. Talk to your pediatrician regarding the use of this medicine in children. Special care may be needed. Overdosage: If you think you have taken too much of this medicine contact a poison control center or emergency room at once. NOTE: This medicine is only for you. Do not share this medicine with others. What if I miss a dose? It is important not to miss your dose. Call your doctor or health care professional if you are unable to keep an appointment. What may interact with this medicine? Do not take this medicine with any of the following medications: -disulfiram -metronidazole This medicine may also interact with the following medications: -cyclosporine -diazepam -ketoconazole -medicines to increase blood counts like filgrastim, pegfilgrastim, sargramostim -other chemotherapy drugs like cisplatin, doxorubicin,  epirubicin, etoposide, teniposide, vincristine -quinidine -testosterone -vaccines -verapamil Talk to your doctor or health care professional before taking any of these medicines: -acetaminophen -aspirin -ibuprofen -ketoprofen -naproxen This list may not describe all possible interactions. Give your health care provider a list of all the medicines, herbs, non-prescription drugs, or dietary supplements you use. Also tell them if you smoke, drink alcohol, or use illegal drugs. Some items may interact with your medicine. What should I watch for while using this medicine? Your condition will be monitored carefully while you are receiving this medicine. You will need important blood work done while you are taking this medicine. This medicine can cause serious allergic reactions. To reduce your risk you will need to take other medicine(s) before treatment with this medicine. If you experience allergic reactions like skin rash, itching or hives, swelling of the face, lips, or tongue, tell your doctor or health care professional right away. In some cases, you may be given additional medicines to help with side effects. Follow all directions for their use. This drug may make you feel generally unwell. This is not uncommon, as chemotherapy can affect healthy cells as well as cancer cells. Report any side effects. Continue your course of treatment even though you feel ill unless your doctor tells you to stop. Call your doctor or health care professional for advice if you get a fever, chills or sore throat, or other symptoms of a cold or flu. Do not treat yourself. This drug decreases your body's ability to fight infections. Try to avoid being around people who are sick. This medicine may increase your risk to bruise or bleed. Call your doctor or health care professional if you notice any unusual bleeding. Be careful brushing and flossing your teeth or   using a toothpick because you may get an infection or  bleed more easily. If you have any dental work done, tell your dentist you are receiving this medicine. Avoid taking products that contain aspirin, acetaminophen, ibuprofen, naproxen, or ketoprofen unless instructed by your doctor. These medicines may hide a fever. Do not become pregnant while taking this medicine. Women should inform their doctor if they wish to become pregnant or think they might be pregnant. There is a potential for serious side effects to an unborn child. Talk to your health care professional or pharmacist for more information. Do not breast-feed an infant while taking this medicine. Men are advised not to father a child while receiving this medicine. This product may contain alcohol. Ask your pharmacist or healthcare provider if this medicine contains alcohol. Be sure to tell all healthcare providers you are taking this medicine. Certain medicines, like metronidazole and disulfiram, can cause an unpleasant reaction when taken with alcohol. The reaction includes flushing, headache, nausea, vomiting, sweating, and increased thirst. The reaction can last from 30 minutes to several hours. What side effects may I notice from receiving this medicine? Side effects that you should report to your doctor or health care professional as soon as possible: -allergic reactions like skin rash, itching or hives, swelling of the face, lips, or tongue -low blood counts - This drug may decrease the number of white blood cells, red blood cells and platelets. You may be at increased risk for infections and bleeding. -signs of infection - fever or chills, cough, sore throat, pain or difficulty passing urine -signs of decreased platelets or bleeding - bruising, pinpoint red spots on the skin, black, tarry stools, nosebleeds -signs of decreased red blood cells - unusually weak or tired, fainting spells, lightheadedness -breathing problems -chest pain -high or low blood pressure -mouth sores -nausea and  vomiting -pain, swelling, redness or irritation at the injection site -pain, tingling, numbness in the hands or feet -slow or irregular heartbeat -swelling of the ankle, feet, hands Side effects that usually do not require medical attention (report to your doctor or health care professional if they continue or are bothersome): -bone pain -complete hair loss including hair on your head, underarms, pubic hair, eyebrows, and eyelashes -changes in the color of fingernails -diarrhea -loosening of the fingernails -loss of appetite -muscle or joint pain -red flush to skin -sweating This list may not describe all possible side effects. Call your doctor for medical advice about side effects. You may report side effects to FDA at 1-800-FDA-1088. Where should I keep my medicine? This drug is given in a hospital or clinic and will not be stored at home. NOTE: This sheet is a summary. It may not cover all possible information. If you have questions about this medicine, talk to your doctor, pharmacist, or health care provider.  2018 [employer] Standard (2015-04-12 19:58:00)  Carboplatin injection What is this medicine? CARBOPLATIN (KAR boe pla tin) is a chemotherapy drug. It targets fast dividing cells, like cancer cells, and causes these cells to die. This medicine is used to treat ovarian cancer and many other cancers. This medicine may be used for other purposes; ask your health care provider or pharmacist if you have questions. COMMON BRAND NAME(S): Paraplatin What should I tell my health care provider before I take this medicine? They need to know if you have any of these conditions: -blood disorders -hearing problems -kidney disease -recent or ongoing radiation therapy -an unusual or allergic reaction to carboplatin, cisplatin, other chemotherapy,  medicines, foods, dyes, or preservatives -pregnant or trying to get pregnant -breast-feeding How should I use this medicine? This drug  is usually given as an infusion into a vein. It is administered in a hospital or clinic by a specially trained health care professional. Talk to your pediatrician regarding the use of this medicine in children. Special care may be needed. Overdosage: If you think you have taken too much of this medicine contact a poison control center or emergency room at once. NOTE: This medicine is only for you. Do not share this medicine with others. What if I miss a dose? It is important not to miss a dose. Call your doctor or health care professional if you are unable to keep an appointment. What may interact with this medicine? -medicines for seizures -medicines to increase blood counts like filgrastim, pegfilgrastim, sargramostim -some antibiotics like amikacin, gentamicin, neomycin, streptomycin, tobramycin -vaccines Talk to your doctor or health care professional before taking any of these medicines: -acetaminophen -aspirin -ibuprofen -ketoprofen -naproxen This list may not describe all possible interactions. Give your health care provider a list of all the medicines, herbs, non-prescription drugs, or dietary supplements you use. Also tell them if you smoke, drink alcohol, or use illegal drugs. Some items may interact with your medicine. What should I watch for while using this medicine? Your condition will be monitored carefully while you are receiving this medicine. You will need important blood work done while you are taking this medicine. This drug may make you feel generally unwell. This is not uncommon, as chemotherapy can affect healthy cells as well as cancer cells. Report any side effects. Continue your course of treatment even though you feel ill unless your doctor tells you to stop. In some cases, you may be given additional medicines to help with side effects. Follow all directions for their use. Call your doctor or health care professional for advice if you get a fever, chills or sore  throat, or other symptoms of a cold or flu. Do not treat yourself. This drug decreases your body's ability to fight infections. Try to avoid being around people who are sick. This medicine may increase your risk to bruise or bleed. Call your doctor or health care professional if you notice any unusual bleeding. Be careful brushing and flossing your teeth or using a toothpick because you may get an infection or bleed more easily. If you have any dental work done, tell your dentist you are receiving this medicine. Avoid taking products that contain aspirin, acetaminophen, ibuprofen, naproxen, or ketoprofen unless instructed by your doctor. These medicines may hide a fever. Do not become pregnant while taking this medicine. Women should inform their doctor if they wish to become pregnant or think they might be pregnant. There is a potential for serious side effects to an unborn child. Talk to your health care professional or pharmacist for more information. Do not breast-feed an infant while taking this medicine. What side effects may I notice from receiving this medicine? Side effects that you should report to your doctor or health care professional as soon as possible: -allergic reactions like skin rash, itching or hives, swelling of the face, lips, or tongue -signs of infection - fever or chills, cough, sore throat, pain or difficulty passing urine -signs of decreased platelets or bleeding - bruising, pinpoint red spots on the skin, black, tarry stools, nosebleeds -signs of decreased red blood cells - unusually weak or tired, fainting spells, lightheadedness -breathing problems -changes in hearing -changes in   in vision -chest pain -high blood pressure -low blood counts - This drug may decrease the number of white blood cells, red blood cells and platelets. You may be at increased risk for infections and bleeding. -nausea and vomiting -pain, swelling, redness or irritation at the injection site -pain,  tingling, numbness in the hands or feet -problems with balance, talking, walking -trouble passing urine or change in the amount of urine Side effects that usually do not require medical attention (report to your doctor or health care professional if they continue or are bothersome): -hair loss -loss of appetite -metallic taste in the mouth or changes in taste This list may not describe all possible side effects. Call your doctor for medical advice about side effects. You may report side effects to FDA at 1-800-FDA-1088. Where should I keep my medicine? This drug is given in a hospital or clinic and will not be stored at home. NOTE: This sheet is a summary. It may not cover all possible information. If you have questions about this medicine, talk to your doctor, pharmacist, or health care provider.  2018 [employer]/Gold Standard (2007-09-16 14:38:05)  Trastuzumab injection for infusion What is this medicine? TRASTUZUMAB (tras TOO zoo mab) is a monoclonal antibody. It is used to treat breast cancer and stomach cancer. This medicine may be used for other purposes; ask your health care provider or pharmacist if you have questions. COMMON BRAND NAME(S): Herceptin What should I tell my health care provider before I take this medicine? They need to know if you have any of these conditions: -heart disease -heart failure -lung or breathing disease, like asthma -an unusual or allergic reaction to trastuzumab, benzyl alcohol, or other medications, foods, dyes, or preservatives -pregnant or trying to get pregnant -breast-feeding How should I use this medicine? This drug is given as an infusion into a vein. It is administered in a hospital or clinic by a specially trained health care professional. Talk to your pediatrician regarding the use of this medicine in children. This medicine is not approved for use in children. Overdosage: If you think you have taken too much of this medicine contact a poison  control center or emergency room at once. NOTE: This medicine is only for you. Do not share this medicine with others. What if I miss a dose? It is important not to miss a dose. Call your doctor or health care professional if you are unable to keep an appointment. What may interact with this medicine? This medicine may interact with the following medications: -certain types of chemotherapy, such as daunorubicin, doxorubicin, epirubicin, and idarubicin This list may not describe all possible interactions. Give your health care provider a list of all the medicines, herbs, non-prescription drugs, or dietary supplements you use. Also tell them if you smoke, drink alcohol, or use illegal drugs. Some items may interact with your medicine. What should I watch for while using this medicine? Visit your doctor for checks on your progress. Report any side effects. Continue your course of treatment even though you feel ill unless your doctor tells you to stop. Call your doctor or health care professional for advice if you get a fever, chills or sore throat, or other symptoms of a cold or flu. Do not treat yourself. Try to avoid being around people who are sick. You may experience fever, chills and shaking during your first infusion. These effects are usually mild and can be treated with other medicines. Report any side effects during the infusion to your health  care professional. Fever and chills usually do not happen with later infusions. Do not become pregnant while taking this medicine or for 7 months after stopping it. Women should inform their doctor if they wish to become pregnant or think they might be pregnant. Women of child-bearing potential will need to have a negative pregnancy test before starting this medicine. There is a potential for serious side effects to an unborn child. Talk to your health care professional or pharmacist for more information. Do not breast-feed an infant while taking this  medicine or for 7 months after stopping it. Women must use effective birth control with this medicine. What side effects may I notice from receiving this medicine? Side effects that you should report to your doctor or health care professional as soon as possible: -allergic reactions like skin rash, itching or hives, swelling of the face, lips, or tongue -chest pain or palpitations -cough -dizziness -feeling faint or lightheaded, falls -fever -general ill feeling or flu-like symptoms -signs of worsening heart failure like breathing problems; swelling in your legs and feet -unusually weak or tired Side effects that usually do not require medical attention (report to your doctor or health care professional if they continue or are bothersome): -bone pain -changes in taste -diarrhea -joint pain -nausea/vomiting -weight loss This list may not describe all possible side effects. Call your doctor for medical advice about side effects. You may report side effects to FDA at 1-800-FDA-1088. Where should I keep my medicine? This drug is given in a hospital or clinic and will not be stored at home. NOTE: This sheet is a summary. It may not cover all possible information. If you have questions about this medicine, talk to your doctor, pharmacist, or health care provider.  2018 [employer]/Gold Standard (2016-06-05 14:37:52) Pertuzumab injection What is this medicine? PERTUZUMAB (per TOOZ ue mab) is a monoclonal antibody. It is used to treat breast cancer. This medicine may be used for other purposes; ask your health care provider or pharmacist if you have questions. COMMON BRAND NAME(S): PERJETA What should I tell my health care provider before I take this medicine? They need to know if you have any of these conditions: -heart disease -heart failure -high blood pressure -history of irregular heart beat -recent or ongoing radiation therapy -an unusual or allergic reaction to pertuzumab, other  medicines, foods, dyes, or preservatives -pregnant or trying to get pregnant -breast-feeding How should I use this medicine? This medicine is for infusion into a vein. It is given by a health care professional in a hospital or clinic setting. Talk to your pediatrician regarding the use of this medicine in children. Special care may be needed. Overdosage: If you think you have taken too much of this medicine contact a poison control center or emergency room at once. NOTE: This medicine is only for you. Do not share this medicine with others. What if I miss a dose? It is important not to miss your dose. Call your doctor or health care professional if you are unable to keep an appointment. What may interact with this medicine? Interactions are not expected. Give your health care provider a list of all the medicines, herbs, non-prescription drugs, or dietary supplements you use. Also tell them if you smoke, drink alcohol, or use illegal drugs. Some items may interact with your medicine. This list may not describe all possible interactions. Give your health care provider a list of all the medicines, herbs, non-prescription drugs, or dietary supplements you use. Also tell them  if you smoke, drink alcohol, or use illegal drugs. Some items may interact with your medicine. What should I watch for while using this medicine? Your condition will be monitored carefully while you are receiving this medicine. Report any side effects. Continue your course of treatment even though you feel ill unless your doctor tells you to stop. Do not become pregnant while taking this medicine or for 7 months after stopping it. Women should inform their doctor if they wish to become pregnant or think they might be pregnant. Women of child-bearing potential will need to have a negative pregnancy test before starting this medicine. There is a potential for serious side effects to an unborn child. Talk to your health care  professional or pharmacist for more information. Do not breast-feed an infant while taking this medicine or for 7 months after stopping it. Women must use effective birth control with this medicine. Call your doctor or health care professional for advice if you get a fever, chills or sore throat, or other symptoms of a cold or flu. Do not treat yourself. Try to avoid being around people who are sick. You may experience fever, chills, and headache during the infusion. Report any side effects during the infusion to your health care professional. What side effects may I notice from receiving this medicine? Side effects that you should report to your doctor or health care professional as soon as possible: -breathing problems -chest pain or palpitations -dizziness -feeling faint or lightheaded -fever or chills -skin rash, itching or hives -sore throat -swelling of the face, lips, or tongue -swelling of the legs or ankles -unusually weak or tired Side effects that usually do not require medical attention (report to your doctor or health care professional if they continue or are bothersome): -diarrhea -hair loss -nausea, vomiting -tiredness This list may not describe all possible side effects. Call your doctor for medical advice about side effects. You may report side effects to FDA at 1-800-FDA-1088. Where should I keep my medicine? This drug is given in a hospital or clinic and will not be stored at home. NOTE: This sheet is a summary. It may not cover all possible information. If you have questions about this medicine, talk to your doctor, pharmacist, or health care provider.  2018 [employer]/Gold Standard (2015-07-14 12:08:50)

## 2017-05-09 ENCOUNTER — Ambulatory Visit: Payer: Self-pay | Admitting: Surgery

## 2017-05-10 ENCOUNTER — Other Ambulatory Visit: Payer: Self-pay

## 2017-05-10 ENCOUNTER — Encounter (HOSPITAL_BASED_OUTPATIENT_CLINIC_OR_DEPARTMENT_OTHER): Payer: Self-pay | Admitting: *Deleted

## 2017-05-10 ENCOUNTER — Telehealth: Payer: Self-pay

## 2017-05-10 NOTE — Telephone Encounter (Signed)
Recalled patient to verify appointments on Monday 19th. And also to pick up contrast for ct/abdomoinal in December. Per 11/14 los

## 2017-05-13 ENCOUNTER — Encounter (HOSPITAL_BASED_OUTPATIENT_CLINIC_OR_DEPARTMENT_OTHER)
Admission: RE | Admit: 2017-05-13 | Discharge: 2017-05-13 | Disposition: A | Discharge status: Home / Self Care | Payer: Medicare Other | Source: Ambulatory Visit | Attending: Surgery | Admitting: Surgery

## 2017-05-13 ENCOUNTER — Other Ambulatory Visit: Payer: Self-pay | Admitting: Hematology

## 2017-05-13 ENCOUNTER — Other Ambulatory Visit: Payer: Self-pay

## 2017-05-13 ENCOUNTER — Other Ambulatory Visit: Payer: Medicare Other

## 2017-05-13 ENCOUNTER — Telehealth: Payer: Self-pay | Admitting: Hematology

## 2017-05-13 ENCOUNTER — Other Ambulatory Visit (HOSPITAL_BASED_OUTPATIENT_CLINIC_OR_DEPARTMENT_OTHER): Payer: Medicare Other

## 2017-05-13 DIAGNOSIS — E785 Hyperlipidemia, unspecified: Secondary | ICD-10-CM | POA: Diagnosis not present

## 2017-05-13 DIAGNOSIS — I10 Essential (primary) hypertension: Secondary | ICD-10-CM | POA: Diagnosis not present

## 2017-05-13 DIAGNOSIS — C50911 Malignant neoplasm of unspecified site of right female breast: Secondary | ICD-10-CM | POA: Diagnosis not present

## 2017-05-13 DIAGNOSIS — Z170 Estrogen receptor positive status [ER+]: Secondary | ICD-10-CM

## 2017-05-13 DIAGNOSIS — C50811 Malignant neoplasm of overlapping sites of right female breast: Secondary | ICD-10-CM

## 2017-05-13 DIAGNOSIS — Z7982 Long term (current) use of aspirin: Secondary | ICD-10-CM | POA: Diagnosis not present

## 2017-05-13 DIAGNOSIS — C50112 Malignant neoplasm of central portion of left female breast: Secondary | ICD-10-CM

## 2017-05-13 DIAGNOSIS — Z79899 Other long term (current) drug therapy: Secondary | ICD-10-CM | POA: Diagnosis not present

## 2017-05-13 DIAGNOSIS — C50912 Malignant neoplasm of unspecified site of left female breast: Secondary | ICD-10-CM | POA: Diagnosis not present

## 2017-05-13 LAB — CBC WITH DIFFERENTIAL/PLATELET
BASO%: 1.1 % (ref 0.0–2.0)
Basophils Absolute: 0.1 10*3/uL (ref 0.0–0.1)
EOS%: 1.7 % (ref 0.0–7.0)
Eosinophils Absolute: 0.1 10*3/uL (ref 0.0–0.5)
HCT: 45.2 % (ref 34.8–46.6)
HGB: 14.9 g/dL (ref 11.6–15.9)
LYMPH%: 24.5 % (ref 14.0–49.7)
MCH: 30.9 pg (ref 25.1–34.0)
MCHC: 33 g/dL (ref 31.5–36.0)
MCV: 93.8 fL (ref 79.5–101.0)
MONO#: 1 10*3/uL — ABNORMAL HIGH (ref 0.1–0.9)
MONO%: 19.2 % — ABNORMAL HIGH (ref 0.0–14.0)
NEUT#: 2.9 10*3/uL (ref 1.5–6.5)
NEUT%: 53.5 % (ref 38.4–76.8)
Platelets: 241 10*3/uL (ref 145–400)
RBC: 4.82 10*6/uL (ref 3.70–5.45)
RDW: 13.3 % (ref 11.2–14.5)
WBC: 5.4 10*3/uL (ref 3.9–10.3)
lymph#: 1.3 10*3/uL (ref 0.9–3.3)

## 2017-05-13 LAB — COMPREHENSIVE METABOLIC PANEL
ALT: 32 U/L (ref 0–55)
AST: 28 U/L (ref 5–34)
Albumin: 4.2 g/dL (ref 3.5–5.0)
Alkaline Phosphatase: 98 U/L (ref 40–150)
Anion Gap: 9 mEq/L (ref 3–11)
BUN: 17.7 mg/dL (ref 7.0–26.0)
CO2: 26 mEq/L (ref 22–29)
Calcium: 9.6 mg/dL (ref 8.4–10.4)
Chloride: 106 mEq/L (ref 98–109)
Creatinine: 0.8 mg/dL (ref 0.6–1.1)
EGFR: >60 mL/min/{1.73_m2} (ref >60)
Glucose: 95 mg/dl (ref 70–140)
Potassium: 4.3 mEq/L (ref 3.5–5.1)
Sodium: 141 mEq/L (ref 136–145)
Total Bilirubin: 0.47 mg/dL (ref 0.20–1.20)
Total Protein: 7.5 g/dL (ref 6.4–8.3)

## 2017-05-13 MED ORDER — LIDOCAINE-PRILOCAINE 2.5-2.5 % EX CREA: TOPICAL_CREAM | CUTANEOUS | 3 refills | Status: DC

## 2017-05-13 MED ORDER — ONDANSETRON HCL 8 MG PO TABS: 8.0000 mg | ORAL_TABLET | Freq: Two times a day (BID) | ORAL | 1 refills | Status: DC | PRN

## 2017-05-13 MED ORDER — DEXAMETHASONE 4 MG PO TABS: 8.0000 mg | ORAL_TABLET | Freq: Two times a day (BID) | ORAL | 1 refills | Status: DC

## 2017-05-13 MED ORDER — PROCHLORPERAZINE MALEATE 10 MG PO TABS: 10.0000 mg | ORAL_TABLET | Freq: Four times a day (QID) | ORAL | 1 refills | Status: DC | PRN

## 2017-05-13 NOTE — Progress Notes (Signed)
Pt arrived for EKG. Ensure presurgery given to pt with instructions attached. Pt to drink all of refrigerated drink by 0415 on DOS. Pt verbalized understanding.

## 2017-05-13 NOTE — Progress Notes (Signed)
START ON PATHWAY REGIMEN - Breast     A cycle is every 21 days:     Pertuzumab      Pertuzumab      Trastuzumab      Trastuzumab      Carboplatin      Docetaxel   **Always confirm dose/schedule in your pharmacy ordering system**    Patient Characteristics: Preoperative or Nonsurgical Candidate (Clinical Staging), Neoadjuvant Therapy followed by Surgery, Invasive Disease, Chemotherapy, HER2 Positive, ER Positive Therapeutic Status: Preoperative or Nonsurgical Candidate (Clinical Staging) AJCC M Category: cM0 AJCC Grade: G2 Breast Surgical Plan: Neoadjuvant Therapy followed by Surgery ER Status: Positive (+) AJCC 8 Stage Grouping: IB HER2 Status: Positive (+) AJCC T Category: cT3 AJCC N Category: cN0 PR Status: Positive (+) Intent of Therapy: Curative Intent, Discussed with Patient

## 2017-05-13 NOTE — Progress Notes (Signed)
EKG reviewed by Dr. Foster, will proceed with surgery as scheduled. 

## 2017-05-13 NOTE — Telephone Encounter (Signed)
I called patient and I discussed the clinical trial UPBEAT study.  She is interested.  I have informed our research team, they will contact her, and try to meet her November 21, when she comes in for echo.  I will also contact radiology department to see if I can move her CT and bone scan from December 3 to next week, I sent another schedule message to schedule chemo first cycle next week.  Patient appreciated the phone call.  She had a chemo class this morning, I have called in EMLA, Zofran and Compazine to her pharmacy today.  She is scheduled for port placement tomorrow.  [name]  05/13/2017

## 2017-05-13 NOTE — Addendum Note (Signed)
Addended by: [name] on: 05/13/2017 04:44 PM   Modules accepted: Orders

## 2017-05-13 NOTE — Progress Notes (Signed)
ALERT: A disease instance has been permanently removed from this patient's pathway record and replaced with a new disease instance. Information on the new disease instance will be transmitted in a separate message.  Disease Being Removed: [Other Dx]  Reason for Removal: Reason not listed 

## 2017-05-14 ENCOUNTER — Other Ambulatory Visit: Payer: Medicare Other

## 2017-05-14 ENCOUNTER — Ambulatory Visit (HOSPITAL_COMMUNITY): Payer: Medicare Other

## 2017-05-14 ENCOUNTER — Other Ambulatory Visit: Payer: Self-pay

## 2017-05-14 ENCOUNTER — Ambulatory Visit (HOSPITAL_BASED_OUTPATIENT_CLINIC_OR_DEPARTMENT_OTHER)
Admission: RE | Admit: 2017-05-14 | Discharge: 2017-05-14 | Disposition: A | Discharge status: Home / Self Care | Payer: Medicare Other | Source: Ambulatory Visit | Attending: Surgery | Admitting: Surgery

## 2017-05-14 ENCOUNTER — Ambulatory Visit (HOSPITAL_BASED_OUTPATIENT_CLINIC_OR_DEPARTMENT_OTHER): Payer: Medicare Other | Admitting: Anesthesiology

## 2017-05-14 ENCOUNTER — Encounter (HOSPITAL_BASED_OUTPATIENT_CLINIC_OR_DEPARTMENT_OTHER)
Admission: RE | Disposition: A | Discharge status: Home / Self Care | Payer: Self-pay | Source: Ambulatory Visit | Attending: Surgery

## 2017-05-14 ENCOUNTER — Telehealth: Payer: Self-pay | Admitting: Nurse Practitioner

## 2017-05-14 ENCOUNTER — Encounter: Payer: Self-pay | Admitting: Medical Oncology

## 2017-05-14 ENCOUNTER — Encounter (HOSPITAL_BASED_OUTPATIENT_CLINIC_OR_DEPARTMENT_OTHER): Payer: Self-pay | Admitting: *Deleted

## 2017-05-14 DIAGNOSIS — Z7982 Long term (current) use of aspirin: Secondary | ICD-10-CM | POA: Diagnosis not present

## 2017-05-14 DIAGNOSIS — C50912 Malignant neoplasm of unspecified site of left female breast: Secondary | ICD-10-CM | POA: Insufficient documentation

## 2017-05-14 DIAGNOSIS — I10 Essential (primary) hypertension: Secondary | ICD-10-CM | POA: Insufficient documentation

## 2017-05-14 DIAGNOSIS — Z79899 Other long term (current) drug therapy: Secondary | ICD-10-CM | POA: Diagnosis not present

## 2017-05-14 DIAGNOSIS — C50911 Malignant neoplasm of unspecified site of right female breast: Secondary | ICD-10-CM | POA: Insufficient documentation

## 2017-05-14 DIAGNOSIS — C50919 Malignant neoplasm of unspecified site of unspecified female breast: Secondary | ICD-10-CM | POA: Diagnosis not present

## 2017-05-14 DIAGNOSIS — C50112 Malignant neoplasm of central portion of left female breast: Secondary | ICD-10-CM | POA: Diagnosis not present

## 2017-05-14 DIAGNOSIS — Z95828 Presence of other vascular implants and grafts: Secondary | ICD-10-CM

## 2017-05-14 DIAGNOSIS — Z452 Encounter for adjustment and management of vascular access device: Secondary | ICD-10-CM | POA: Diagnosis not present

## 2017-05-14 DIAGNOSIS — C50811 Malignant neoplasm of overlapping sites of right female breast: Secondary | ICD-10-CM | POA: Diagnosis not present

## 2017-05-14 DIAGNOSIS — E785 Hyperlipidemia, unspecified: Secondary | ICD-10-CM | POA: Insufficient documentation

## 2017-05-14 DIAGNOSIS — C50411 Malignant neoplasm of upper-outer quadrant of right female breast: Secondary | ICD-10-CM | POA: Diagnosis not present

## 2017-05-14 HISTORY — DX: Essential (primary) hypertension: I10

## 2017-05-14 HISTORY — PX: PORTACATH PLACEMENT: SHX2246

## 2017-05-14 HISTORY — DX: Hyperlipidemia, unspecified: E78.5

## 2017-05-14 HISTORY — DX: Malignant (primary) neoplasm, unspecified: C80.1

## 2017-05-14 LAB — CANCER ANTIGEN 27.29: CA 27.29: 59.5 U/mL — ABNORMAL HIGH (ref 0.0–38.6)

## 2017-05-14 SURGERY — INSERTION, TUNNELED CENTRAL VENOUS DEVICE, WITH PORT
Anesthesia: General | Site: Chest | Laterality: Right

## 2017-05-14 MED ORDER — PROPOFOL 500 MG/50ML IV EMUL
INTRAVENOUS | Status: AC
  Filled 2017-05-14: qty 50

## 2017-05-14 MED ORDER — HEPARIN SOD (PORK) LOCK FLUSH 100 UNIT/ML IV SOLN
INTRAVENOUS | Status: DC | PRN
  Administered 2017-05-14: 500 [IU] via INTRAVENOUS

## 2017-05-14 MED ORDER — ONDANSETRON HCL 4 MG/2ML IJ SOLN
INTRAMUSCULAR | Status: DC | PRN
  Administered 2017-05-14: 4 mg via INTRAVENOUS

## 2017-05-14 MED ORDER — LIDOCAINE 2% (20 MG/ML) 5 ML SYRINGE
INTRAMUSCULAR | Status: DC | PRN
  Administered 2017-05-14: 60 mg via INTRAVENOUS

## 2017-05-14 MED ORDER — FENTANYL CITRATE (PF) 100 MCG/2ML IJ SOLN
INTRAMUSCULAR | Status: AC
  Filled 2017-05-14: qty 2

## 2017-05-14 MED ORDER — ONDANSETRON HCL 4 MG/2ML IJ SOLN
INTRAMUSCULAR | Status: AC
  Filled 2017-05-14: qty 2

## 2017-05-14 MED ORDER — CHLORHEXIDINE GLUCONATE CLOTH 2 % EX PADS: 6.0000 | MEDICATED_PAD | Freq: Once | CUTANEOUS | Status: DC

## 2017-05-14 MED ORDER — ACETAMINOPHEN 500 MG PO TABS
1000.0000 mg | ORAL_TABLET | ORAL | Status: AC
  Administered 2017-05-14: 1000 mg via ORAL

## 2017-05-14 MED ORDER — ONDANSETRON HCL 4 MG/2ML IJ SOLN: 4.0000 mg | Freq: Once | INTRAMUSCULAR | Status: DC | PRN

## 2017-05-14 MED ORDER — CEFAZOLIN SODIUM-DEXTROSE 2-4 GM/100ML-% IV SOLN
INTRAVENOUS | Status: AC
  Filled 2017-05-14: qty 100

## 2017-05-14 MED ORDER — LIDOCAINE 2% (20 MG/ML) 5 ML SYRINGE
INTRAMUSCULAR | Status: AC
  Filled 2017-05-14: qty 5

## 2017-05-14 MED ORDER — ACETAMINOPHEN 500 MG PO TABS
ORAL_TABLET | ORAL | Status: AC
  Filled 2017-05-14: qty 2

## 2017-05-14 MED ORDER — BUPIVACAINE-EPINEPHRINE (PF) 0.25% -1:200000 IJ SOLN
INTRAMUSCULAR | Status: AC
  Filled 2017-05-14: qty 30

## 2017-05-14 MED ORDER — BUPIVACAINE-EPINEPHRINE 0.25% -1:200000 IJ SOLN
INTRAMUSCULAR | Status: DC | PRN
  Administered 2017-05-14: 4 mL

## 2017-05-14 MED ORDER — FENTANYL CITRATE (PF) 100 MCG/2ML IJ SOLN: 25.0000 ug | INTRAMUSCULAR | Status: DC | PRN

## 2017-05-14 MED ORDER — LACTATED RINGERS IV SOLN
INTRAVENOUS | Status: DC
  Administered 2017-05-14 (×2): via INTRAVENOUS

## 2017-05-14 MED ORDER — PROPOFOL 10 MG/ML IV BOLUS
INTRAVENOUS | Status: DC | PRN
  Administered 2017-05-14: 150 mg via INTRAVENOUS

## 2017-05-14 MED ORDER — DEXAMETHASONE SODIUM PHOSPHATE 10 MG/ML IJ SOLN
INTRAMUSCULAR | Status: AC
  Filled 2017-05-14: qty 1

## 2017-05-14 MED ORDER — FENTANYL CITRATE (PF) 100 MCG/2ML IJ SOLN
50.0000 ug | INTRAMUSCULAR | Status: DC | PRN
  Administered 2017-05-14: 100 ug via INTRAVENOUS

## 2017-05-14 MED ORDER — DEXAMETHASONE SODIUM PHOSPHATE 4 MG/ML IJ SOLN
INTRAMUSCULAR | Status: DC | PRN
  Administered 2017-05-14: 10 mg via INTRAVENOUS

## 2017-05-14 MED ORDER — MIDAZOLAM HCL 2 MG/2ML IJ SOLN
1.0000 mg | INTRAMUSCULAR | Status: DC | PRN
  Administered 2017-05-14: 2 mg via INTRAVENOUS

## 2017-05-14 MED ORDER — HEPARIN SOD (PORK) LOCK FLUSH 100 UNIT/ML IV SOLN
INTRAVENOUS | Status: AC
  Filled 2017-05-14: qty 5

## 2017-05-14 MED ORDER — HEPARIN (PORCINE) IN NACL 2-0.9 UNIT/ML-% IJ SOLN
INTRAMUSCULAR | Status: AC | PRN
  Administered 2017-05-14: 30 mL via INTRAVENOUS

## 2017-05-14 MED ORDER — HEPARIN (PORCINE) IN NACL 2-0.9 UNIT/ML-% IJ SOLN
INTRAMUSCULAR | Status: AC
  Filled 2017-05-14: qty 500

## 2017-05-14 MED ORDER — SCOPOLAMINE 1 MG/3DAYS TD PT72: 1.0000 | MEDICATED_PATCH | Freq: Once | TRANSDERMAL | Status: DC | PRN

## 2017-05-14 MED ORDER — CEFAZOLIN SODIUM-DEXTROSE 2-4 GM/100ML-% IV SOLN
2.0000 g | INTRAVENOUS | Status: AC
  Administered 2017-05-14: 2 g via INTRAVENOUS

## 2017-05-14 MED ORDER — EPHEDRINE SULFATE-NACL 50-0.9 MG/10ML-% IV SOSY
PREFILLED_SYRINGE | INTRAVENOUS | Status: DC | PRN
  Administered 2017-05-14 (×4): 10 mg via INTRAVENOUS

## 2017-05-14 MED ORDER — MIDAZOLAM HCL 2 MG/2ML IJ SOLN
INTRAMUSCULAR | Status: AC
  Filled 2017-05-14: qty 2

## 2017-05-14 SURGICAL SUPPLY — 56 items
BAG DECANTER FOR FLEXI CONT (MISCELLANEOUS) ×3 IMPLANT
BENZOIN TINCTURE PRP APPL 2/3 (GAUZE/BANDAGES/DRESSINGS) ×3 IMPLANT
BLADE SURG 11 STRL SS (BLADE) ×3 IMPLANT
BLADE SURG 15 STRL LF DISP TIS (BLADE) ×1 IMPLANT
BLADE SURG 15 STRL SS (BLADE) ×2
CANISTER SUCT 1200ML W/VALVE (MISCELLANEOUS) IMPLANT
CATH SINGLE LUMEN 9.6F (PORTABLE EQUIPMENT SUPPLIES) IMPLANT
CHLORAPREP W/TINT 26ML (MISCELLANEOUS) ×3 IMPLANT
CLEANER CAUTERY TIP 5X5 PAD (MISCELLANEOUS) ×1 IMPLANT
CLOSURE WOUND 1/2 X4 (GAUZE/BANDAGES/DRESSINGS) ×1
COVER BACK TABLE 60X90IN (DRAPES) ×3 IMPLANT
COVER MAYO STAND STRL (DRAPES) ×3 IMPLANT
COVER PROBE 5X48 (MISCELLANEOUS) ×2
DECANTER SPIKE VIAL GLASS SM (MISCELLANEOUS) ×3 IMPLANT
DRAPE C-ARM 42X72 X-RAY (DRAPES) ×3 IMPLANT
DRAPE LAPAROTOMY TRNSV 102X78 (DRAPE) ×3 IMPLANT
DRAPE UTILITY XL STRL (DRAPES) ×3 IMPLANT
DRSG TEGADERM 2-3/8X2-3/4 SM (GAUZE/BANDAGES/DRESSINGS) ×3 IMPLANT
DRSG TEGADERM 4X4.75 (GAUZE/BANDAGES/DRESSINGS) ×3 IMPLANT
ELECT REM PT RETURN 9FT ADLT (ELECTROSURGICAL) ×3
ELECTRODE REM PT RTRN 9FT ADLT (ELECTROSURGICAL) ×1 IMPLANT
GAUZE SPONGE 4X4 12PLY STRL LF (GAUZE/BANDAGES/DRESSINGS) IMPLANT
GAUZE SPONGE 4X4 16PLY XRAY LF (GAUZE/BANDAGES/DRESSINGS) ×3 IMPLANT
GLOVE BIO SURGEON STRL SZ7 (GLOVE) ×3 IMPLANT
GLOVE BIOGEL PI IND STRL 7.0 (GLOVE) ×2 IMPLANT
GLOVE BIOGEL PI IND STRL 7.5 (GLOVE) ×1 IMPLANT
GLOVE BIOGEL PI INDICATOR 7.0 (GLOVE) ×4
GLOVE BIOGEL PI INDICATOR 7.5 (GLOVE) ×2
GLOVE ECLIPSE 6.5 STRL STRAW (GLOVE) ×3 IMPLANT
GOWN STRL REUS W/ TWL LRG LVL3 (GOWN DISPOSABLE) ×2 IMPLANT
GOWN STRL REUS W/TWL LRG LVL3 (GOWN DISPOSABLE) ×4
IV KIT MINILOC 20X1 SAFETY (NEEDLE) IMPLANT
KIT CVR 48X5XPRB PLUP LF (MISCELLANEOUS) ×1 IMPLANT
KIT PORT POWER 8FR ISP CVUE (Miscellaneous) ×3 IMPLANT
NDL SAFETY ECLIPSE 18X1.5 (NEEDLE) IMPLANT
NEEDLE HYPO 18GX1.5 SHARP (NEEDLE)
NEEDLE HYPO 25X1 1.5 SAFETY (NEEDLE) ×3 IMPLANT
NEEDLE SPNL 22GX3.5 QUINCKE BK (NEEDLE) IMPLANT
PACK BASIN DAY SURGERY FS (CUSTOM PROCEDURE TRAY) ×3 IMPLANT
PAD CLEANER CAUTERY TIP 5X5 (MISCELLANEOUS) ×2
PENCIL BUTTON HOLSTER BLD 10FT (ELECTRODE) ×3 IMPLANT
SLEEVE SCD COMPRESS KNEE MED (MISCELLANEOUS) ×3 IMPLANT
SPONGE GAUZE 2X2 8PLY STER LF (GAUZE/BANDAGES/DRESSINGS) ×1
SPONGE GAUZE 2X2 8PLY STRL LF (GAUZE/BANDAGES/DRESSINGS) ×2 IMPLANT
STRIP CLOSURE SKIN 1/2X4 (GAUZE/BANDAGES/DRESSINGS) ×2 IMPLANT
SUT MON AB 4-0 PC3 18 (SUTURE) ×3 IMPLANT
SUT PROLENE 2 0 CT2 30 (SUTURE) ×3 IMPLANT
SUT VIC AB 3-0 SH 27 (SUTURE) ×2
SUT VIC AB 3-0 SH 27X BRD (SUTURE) ×1 IMPLANT
SYR 5ML LUER SLIP (SYRINGE) ×3 IMPLANT
SYR CONTROL 10ML LL (SYRINGE) ×3 IMPLANT
TOWEL OR 17X24 6PK STRL BLUE (TOWEL DISPOSABLE) ×3 IMPLANT
TOWEL OR NON WOVEN STRL DISP B (DISPOSABLE) ×3 IMPLANT
TUBE CONNECTING 20'X1/4 (TUBING)
TUBE CONNECTING 20X1/4 (TUBING) IMPLANT
YANKAUER SUCT BULB TIP NO VENT (SUCTIONS) IMPLANT

## 2017-05-14 NOTE — H&P (Signed)
History of Present Illness Madison Stokes. Madison Thune MD; 04/29/2017 10:00 PM) The patient is a 71 year old female who presents with breast cancer. PCP - Dr. [name]/ [name] Referred by Dr. [name] for right breast cancer  This is a pleasant 71 yo female accompanied by husband and daughter-in-law (Nurse at [employer]) who was recently diagnosed with breast cancer. She had normal mammogram 05/02/16. In September, 2018, she palpated a large mass in the outer part of her right breast. She was seen by Dr. Dema Severin, who referred her for mammogram and ultrasound on 03/08/17. This showed no discrete masses, distortion, or asymmetry. Ultrasound also showed no discrete mass and ultrasound was negative. She underwent biopsy on 04/24/17, which returned a preliminary diagnosis of invasive mammary carcinoma, likely lobular. Further studies are pending.  Menarche - 7 First pregnancy - 28 Breastfeed - no OCP - 10 years Menopause - age 33 FH - negative  CLINICAL DATA: Patient presents with a palpable lump along the lateral aspect the right breast. Patient believes that this is a new lies. It is nontender.  EXAM: 2D DIGITAL DIAGNOSTIC RIGHT MAMMOGRAM WITH CAD AND ADJUNCT TOMO  ULTRASOUND RIGHT BREAST  COMPARISON: Previous exam(s).  ACR Breast Density Category c: The breast tissue is heterogeneously dense, which may obscure small masses.  FINDINGS: There are no discrete masses, areas of architectural distortion, areas of significant asymmetry or suspicious calcifications. No mammographic change.  Mammographic images were processed with CAD.  On physical exam, there is a firm but mobile palpable abnormality in the lateral right breast approximately 2-3 cm from the nipple, extending towards the upper-outer quadrant.  Targeted ultrasound is performed, showing heterogeneous fibroglandular tissue centered on the 9:30 o'clock position of the right breast, 2 cm the nipple. There is no discrete  mass.  Sonographic evaluation the right axilla shows no enlarged or abnormal lymph nodes.  IMPRESSION: 1. Patient has a is palpable mass in the lateral right breast, in the location dense fibroglandular breast tissue. Although no discrete mass is seen sonographically or mammographically, the Clinical change remains concerning. Biopsy is recommended.  RECOMMENDATION: 1. Ultrasound-guided core needle biopsy of palpable abnormality in the lateral right breast.  I have discussed the findings and recommendations with the patient. Results were also provided in writing at the conclusion of the visit. If applicable, a reminder letter will be sent to the patient regarding the next appointment.  BI-RADS CATEGORY 4: Suspicious.   Electronically Signed By: Lajean Manes M.D. On: 03/08/2017 11:24  CLINICAL DATA: Patient presents for ultrasound-guided core needle biopsy of a new palpable lump in the lateral to upper outer aspect of the right breast. On the diagnostic images, there was an ill-defined area of hypoechogenicity corresponding to the palpable lump.  EXAM: ULTRASOUND GUIDED RIGHT BREAST CORE NEEDLE BIOPSY  COMPARISON: Previous exam(s).  FINDINGS: I met with the patient and we discussed the procedure of ultrasound-guided biopsy, including benefits and alternatives. We discussed the high likelihood of a successful procedure. We discussed the risks of the procedure, including infection, bleeding, tissue injury, clip migration, and inadequate sampling. Informed written consent was given. The usual time-out protocol was performed immediately prior to the procedure.  Lesion quadrant: Upper outer quadrant  Using sterile technique and 1% Lidocaine as local anesthetic, under direct ultrasound visualization, a 12 gauge spring-loaded device was used to perform biopsy of the palpable lump and corresponding ill-defined area of hypoechogenicity in the upper outer  quadrant, centered at 9:30 o'clock, using an inferior approach. At the conclusion  of the procedure a ribbon shaped tissue marker clip was deployed into the biopsy cavity. Follow up 2 view mammogram was performed and dictated separately.  IMPRESSION: Ultrasound guided biopsy of the right breast. No apparent complications.  Electronically Signed: By: Lajean Manes M.D. 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 This was found to be concordant by Dr. Lajean Manes. Pathology results were discussed with the patient by telephone. The patient reported doing well after the biopsy with tenderness at the site. Post biopsy instructions and care were reviewed and questions were answered. The patient was encouraged to call [facility] for any additional concerns. Surgical consultation has been arranged with Dr. [name] at [facility] on April 29, 2017.  Pathology results reported by Terie Purser, RN on 04/25/2017.   Electronically Signed By: Lajean Manes M.D. On: 04/25/2017 14:36    Allergies [name], [place]; 04/29/2017 11:22 AM) No Known Drug Allergies 04/29/2017 Allergies Reconciled   Medication History [name], RMA; 04/29/2017 11:22 AM) Irbesartan (150MG Tablet, Oral) Active. Simvastatin (40MG Tablet, Oral) Active. Multi-Day (Oral) Active. Aspir-Low (81MG Tablet DR, Oral) Active. Medications Reconciled  Vitals [name] RMA; 04/29/2017 11:21 AM) 04/29/2017 11:21 AM Weight: 125.4 lb Height: 62in Body Surface Area: 1.57 m Body Mass Index: 22.94 kg/m  Temp.: 98.50F  Pulse: 80 (Regular)  BP: 140/72 (Sitting, Left Arm, Standard)       Physical Exam Rodman Key K. Plato Alspaugh MD; 04/29/2017 10:01 PM) The physical exam findings are as follows: Note:WDWN in NAD Eyes: Pupils equal, round;  sclera anicteric HENT: Oral mucosa moist; good dentition Neck: No masses palpated, no thyromegaly Lungs: CTA bilaterally; normal respiratory effort Breasts: left breast - no nipple changes; no palpable masses; no axillary lymphadenopathy right breast - significant bruising lateral to nipple; no nipple retraction; no axillary lymphadenopathy Palpable 5 cm area of firmness in the lateral right breast at 9:00; mildly tender since biopsy CV: Regular rate and rhythm; no murmurs; extremities well-perfused with no edema Abd: +bowel sounds, soft, non-tender, no palpable organomegaly; no palpable hernias Skin: Warm, dry; no sign of jaundice Psychiatric - alert and oriented x 4; calm mood and affect    Assessment & Plan Rodman Key K. Daemion Mcniel MD; 04/29/2017 10:09 PM) INVASIVE LOBULAR CARCINOMA OF BREAST IN FEMALE (C50.919) Current Plans MRI, BOTH BREASTS (92426) Referred to Oncology, for evaluation and follow up (Oncology). Routine. Note:I spent over an hour with the patient and her family discussing her situation. I explained that since the mammogram and ultrasound did not actually visualize the mass, I would like to obtain an MRI to evaluate the size of the primary tumor as well as ruling out other areas of possible suspicion. This will help guide our treatment planning. She may be a candidate for neoadjuvant chemotherapy in hopes of downsizing the tumor to allow for breast conserving therapy. She will either need a mastectomy +/- reconstruction with sentinel lymph node biopsy vs. neoadjuvant/ lumpectomy/ SLNB. We discussed both of these options and the surgical procedures. We also discussed the possibility of port placement for neoadjuvant. We will refer her to Oncology for discussion ASAP.   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  She has met with Oncology and they are planning neoadjuvant chemotherapy.  She presents now for port placement.  The surgical  procedure has been discussed with the patient.  Potential risks, benefits, alternative treatments,  and expected outcomes have been explained.  All of the patient's questions at this time have been answered.  The likelihood of reaching the patient's treatment goal is good.  The patient understand the proposed surgical procedure and wishes to proceed.   Madison Stokes. Georgette Dover, MD, [facility] Surgery  General/ Trauma Surgery  05/14/2017 7:21 AM

## 2017-05-14 NOTE — Anesthesia Postprocedure Evaluation (Signed)
Anesthesia Post Note  Patient: Chaunte Hornbeck  Procedure(s) Performed: ULTRASOUND GUIDED PORT PLACEMENT (Right Chest)     Patient location during evaluation: PACU Anesthesia Type: General Level of consciousness: awake and alert Pain management: pain level controlled Vital Signs Assessment: post-procedure vital signs reviewed and stable Respiratory status: spontaneous breathing, nonlabored ventilation and respiratory function stable Cardiovascular status: blood pressure returned to baseline and stable Postop Assessment: no apparent nausea or vomiting Anesthetic complications: no    Last Vitals:  Vitals:   05/14/17 0930 05/14/17 1010  BP: 134/85 129/80  Pulse: 82 86  Resp: 13 16  Temp:  (!) 36.3 C  SpO2: 94% 96%    Last Pain:  Vitals:   05/14/17 1010  TempSrc: Oral  PainSc: 0-No pain                 Catalina Gravel

## 2017-05-14 NOTE — Anesthesia Preprocedure Evaluation (Addendum)
Anesthesia Evaluation  Patient identified by MRN, date of birth, ID band Patient awake    Reviewed: Allergy & Precautions, NPO status , Patient's Chart, lab work & pertinent test results  History of Anesthesia Complications Negative for: history of anesthetic complications  Airway Mallampati: II  TM Distance: >3 FB Neck ROM: Full    Dental  (+) Teeth Intact, Dental Advisory Given   Pulmonary neg pulmonary ROS,    Pulmonary exam normal breath sounds clear to auscultation       Cardiovascular hypertension, Pt. on medications Normal cardiovascular exam Rhythm:Regular Rate:Normal  HLD   Neuro/Psych negative neurological ROS  negative psych ROS   GI/Hepatic negative GI ROS, Neg liver ROS,   Endo/Other  negative endocrine ROS  Renal/GU negative Renal ROS     Musculoskeletal negative musculoskeletal ROS (+)   Abdominal   Peds  Hematology negative hematology ROS (+)   Anesthesia Other Findings Day of surgery medications reviewed with the patient.  Breast cancer  Reproductive/Obstetrics                            Anesthesia Physical Anesthesia Plan  ASA: II  Anesthesia Plan: General   Post-op Pain Management:    Induction: Intravenous  PONV Risk Score and Plan: 3 and Ondansetron, Dexamethasone, Treatment may vary due to age or medical condition and Midazolam  Airway Management Planned: LMA  Additional Equipment:   Intra-op Plan:   Post-operative Plan: Extubation in OR  Informed Consent: I have reviewed the patients History and Physical, chart, labs and discussed the procedure including the risks, benefits and alternatives for the proposed anesthesia with the patient or authorized representative who has indicated his/her understanding and acceptance.   Dental advisory given  Plan Discussed with: CRNA  Anesthesia Plan Comments: (Risks/benefits of general anesthesia discussed with  patient including risk of damage to teeth, lips, gum, and tongue, nausea/vomiting, allergic reactions to medications, and the possibility of heart attack, stroke and death.  All patient questions answered.  Patient wishes to proceed.)        Anesthesia Quick Evaluation

## 2017-05-14 NOTE — Anesthesia Procedure Notes (Signed)
Procedure Name: LMA Insertion Date/Time: 05/14/2017 7:37 AM Performed by: Lyndee Leo, CRNA Pre-anesthesia Checklist: Patient identified, Emergency Drugs available, Suction available and Patient being monitored Patient Re-evaluated:Patient Re-evaluated prior to induction Oxygen Delivery Method: Circle system utilized Preoxygenation: Pre-oxygenation with 100% oxygen Induction Type: IV induction Ventilation: Mask ventilation without difficulty LMA: LMA inserted LMA Size: 4.0 Number of attempts: 1 Airway Equipment and Method: Bite block Placement Confirmation: positive ETCO2 Tube secured with: Tape Dental Injury: Teeth and Oropharynx as per pre-operative assessment

## 2017-05-14 NOTE — Telephone Encounter (Signed)
Unable to schedule treatment due to capped day on 11/28 - message sent to [facility] mon - will contact patient when all appts are able to be scheduled per 11/14 los.

## 2017-05-14 NOTE — Transfer of Care (Signed)
Immediate Anesthesia Transfer of Care Note  Patient: Madison Stokes  Procedure(s) Performed: ULTRASOUND GUIDED PORT PLACEMENT (Right Chest)  Patient Location: PACU  Anesthesia Type:General  Level of Consciousness: awake, sedated and patient cooperative  Airway & Oxygen Therapy: Patient Spontanous Breathing and Patient connected to face mask oxygen  Post-op Assessment: Report given to RN and Post -op Vital signs reviewed and stable  Post vital signs: Reviewed and stable  Last Vitals:  Vitals:   05/14/17 0636  BP: (!) 142/92  Pulse: 78  Resp: 18  Temp: 36.8 C  SpO2: 100%    Last Pain:  Vitals:   05/14/17 0636  TempSrc: Oral  PainSc: 0-No pain      Patients Stated Pain Goal: 0 (94/71/25 2712)  Complications: No apparent anesthesia complications

## 2017-05-14 NOTE — Op Note (Signed)
Preop diagnosis: Bilateral breast cancer Postop diagnosis: Same Procedure performed: Ultrasound-guided right internal jugular vein port placement Surgeon:Kyani Simkin K Floreine Kingdon Anesthesia: General via LMA Indications: This is a 71 year old female who was recently diagnosed with bilateral breast cancers.  She is planning to undergo neoadjuvant chemotherapy.  She presents now for port placement.  Description of procedure: The patient was brought to the operating room placed in supine position on the operating room table with a roll behind her shoulders.  After an adequate level of general anesthesia was obtained, she was placed in Trendelenburg position with her head tilted slightly to the left.  We prepped her right neck and right upper chest with ChloraPrep and draped in sterile fashion.  A timeout was taken to ensure the proper patient and proper procedure.  We interrogated the right neck with ultrasound and identified the internal jugular vein.  An 18-gauge needle was used to cannulate the internal jugular vein under ultrasound guidance.  The wire was passed under ultrasound guidance.  Fluoroscopy confirmed that the wire was passing down the right side of the mediastinum.  There was good blood return and the syringe.  We then created a subcutaneous pocket below the right clavicle after infiltrating with local anesthetic.  An 8 [place] port was assembled and was tunneled from the subcutaneous pocket up to the insertion site.  We used fluoroscopy to estimate the length of the catheter and cut it at 24 cm.  The dilator and breakaway sheath were then passed over the wire down into the internal jugular vein.  The dilator and wire were then removed.  The catheter was then passed down the breakaway sheath and the sheath was removed.  We then examined the entire course of the catheter with fluoroscopy.  The catheter seems to be about 3 cm too long.  We then pulled the catheter back by 3 cm and remove 3 cm of  catheter proximally.  We reconnected the catheter to the port.  We were able aspirate and flushed easily.  Fluoroscopy confirmed the appropriate length of the catheter.  The port was then secured with 2-0 Prolene.  We instilled concentrated heparin solution.  The wound was closed with 3-0 Vicryl and 4-0 Monocryl.  The insertion site in the neck was closed with 4-0 Monocryl.  Benzoin Steri-Strips were applied.  The patient was then extubated and brought to the recovery room in stable condition.  All sponge, instrument, and needle counts are correct.  Imogene Burn. Georgette Dover, MD, [facility] Surgery  General/ Trauma Surgery  05/14/2017 8:35 AM

## 2017-05-14 NOTE — Discharge Instructions (Signed)

## 2017-05-15 ENCOUNTER — Ambulatory Visit (HOSPITAL_BASED_OUTPATIENT_CLINIC_OR_DEPARTMENT_OTHER)
Admission: RE | Admit: 2017-05-15 | Discharge: 2017-05-15 | Disposition: A | Discharge status: Home / Self Care | Payer: Medicare Other | Source: Ambulatory Visit | Attending: Nurse Practitioner | Admitting: Nurse Practitioner

## 2017-05-15 ENCOUNTER — Other Ambulatory Visit: Payer: Medicare Other

## 2017-05-15 ENCOUNTER — Encounter: Payer: Self-pay | Admitting: Medical Oncology

## 2017-05-15 ENCOUNTER — Other Ambulatory Visit (HOSPITAL_COMMUNITY): Payer: Medicare Other

## 2017-05-15 ENCOUNTER — Encounter (HOSPITAL_BASED_OUTPATIENT_CLINIC_OR_DEPARTMENT_OTHER): Payer: Self-pay | Admitting: Surgery

## 2017-05-15 DIAGNOSIS — C50811 Malignant neoplasm of overlapping sites of right female breast: Secondary | ICD-10-CM | POA: Diagnosis not present

## 2017-05-15 DIAGNOSIS — Z79899 Other long term (current) drug therapy: Secondary | ICD-10-CM | POA: Diagnosis not present

## 2017-05-15 DIAGNOSIS — Z170 Estrogen receptor positive status [ER+]: Secondary | ICD-10-CM

## 2017-05-15 DIAGNOSIS — C50112 Malignant neoplasm of central portion of left female breast: Secondary | ICD-10-CM

## 2017-05-15 DIAGNOSIS — I10 Essential (primary) hypertension: Secondary | ICD-10-CM

## 2017-05-15 DIAGNOSIS — Z7982 Long term (current) use of aspirin: Secondary | ICD-10-CM | POA: Diagnosis not present

## 2017-05-15 DIAGNOSIS — C50911 Malignant neoplasm of unspecified site of right female breast: Secondary | ICD-10-CM | POA: Diagnosis not present

## 2017-05-15 DIAGNOSIS — C50912 Malignant neoplasm of unspecified site of left female breast: Secondary | ICD-10-CM | POA: Diagnosis not present

## 2017-05-15 DIAGNOSIS — E785 Hyperlipidemia, unspecified: Secondary | ICD-10-CM | POA: Diagnosis not present

## 2017-05-15 NOTE — Progress Notes (Signed)
  Echocardiogram 2D Echocardiogram has been performed.  Madison Stokes 05/15/2017, 9:47 AM

## 2017-05-17 ENCOUNTER — Telehealth: Payer: Self-pay | Admitting: Nurse Practitioner

## 2017-05-17 NOTE — Telephone Encounter (Signed)
Left message for patient regarding appts that have been added.

## 2017-05-20 DIAGNOSIS — Z23 Encounter for immunization: Secondary | ICD-10-CM | POA: Diagnosis not present

## 2017-05-21 ENCOUNTER — Encounter: Payer: Self-pay | Admitting: Hematology

## 2017-05-21 ENCOUNTER — Other Ambulatory Visit: Payer: Medicare Other

## 2017-05-21 ENCOUNTER — Other Ambulatory Visit: Payer: Self-pay | Admitting: Hematology

## 2017-05-21 DIAGNOSIS — Z006 Encounter for examination for normal comparison and control in clinical research program: Secondary | ICD-10-CM

## 2017-05-21 NOTE — Progress Notes (Signed)
Submitted auth request for Ondansetron today.  Status is pending.  °

## 2017-05-22 ENCOUNTER — Encounter: Payer: Self-pay | Admitting: Hematology

## 2017-05-22 ENCOUNTER — Encounter: Payer: Self-pay | Admitting: Pharmacist

## 2017-05-22 ENCOUNTER — Encounter (HOSPITAL_COMMUNITY)
Admit: 2017-05-22 | Discharge: 2017-05-22 | Disposition: A | Discharge status: Home / Self Care | Payer: Medicare Other | Attending: Nurse Practitioner | Admitting: Nurse Practitioner

## 2017-05-22 ENCOUNTER — Encounter (HOSPITAL_COMMUNITY): Payer: Self-pay

## 2017-05-22 ENCOUNTER — Ambulatory Visit (HOSPITAL_COMMUNITY)
Admit: 2017-05-22 | Discharge: 2017-05-22 | Disposition: A | Discharge status: Home / Self Care | Payer: Medicare Other | Attending: Nurse Practitioner | Admitting: Nurse Practitioner

## 2017-05-22 DIAGNOSIS — I77810 Thoracic aortic ectasia: Secondary | ICD-10-CM | POA: Diagnosis not present

## 2017-05-22 DIAGNOSIS — I700 Atherosclerosis of aorta: Secondary | ICD-10-CM | POA: Insufficient documentation

## 2017-05-22 DIAGNOSIS — D259 Leiomyoma of uterus, unspecified: Secondary | ICD-10-CM | POA: Insufficient documentation

## 2017-05-22 DIAGNOSIS — C50112 Malignant neoplasm of central portion of left female breast: Secondary | ICD-10-CM

## 2017-05-22 DIAGNOSIS — K449 Diaphragmatic hernia without obstruction or gangrene: Secondary | ICD-10-CM | POA: Diagnosis not present

## 2017-05-22 DIAGNOSIS — Z170 Estrogen receptor positive status [ER+]: Secondary | ICD-10-CM | POA: Insufficient documentation

## 2017-05-22 DIAGNOSIS — C50919 Malignant neoplasm of unspecified site of unspecified female breast: Secondary | ICD-10-CM | POA: Diagnosis not present

## 2017-05-22 DIAGNOSIS — N281 Cyst of kidney, acquired: Secondary | ICD-10-CM | POA: Diagnosis not present

## 2017-05-22 DIAGNOSIS — C50811 Malignant neoplasm of overlapping sites of right female breast: Secondary | ICD-10-CM | POA: Diagnosis not present

## 2017-05-22 DIAGNOSIS — N8110 Cystocele, unspecified: Secondary | ICD-10-CM | POA: Diagnosis not present

## 2017-05-22 DIAGNOSIS — N200 Calculus of kidney: Secondary | ICD-10-CM | POA: Diagnosis not present

## 2017-05-22 DIAGNOSIS — M47816 Spondylosis without myelopathy or radiculopathy, lumbar region: Secondary | ICD-10-CM | POA: Diagnosis not present

## 2017-05-22 DIAGNOSIS — C50912 Malignant neoplasm of unspecified site of left female breast: Secondary | ICD-10-CM | POA: Diagnosis not present

## 2017-05-22 DIAGNOSIS — C50911 Malignant neoplasm of unspecified site of right female breast: Secondary | ICD-10-CM | POA: Diagnosis not present

## 2017-05-22 MED ORDER — TECHNETIUM TC 99M MEDRONATE IV KIT
21.4000 | PACK | Freq: Once | INTRAVENOUS | Status: AC | PRN
  Administered 2017-05-22: 21.4 via INTRAVENOUS

## 2017-05-22 MED ORDER — IOPAMIDOL (ISOVUE-300) INJECTION 61%
100.0000 mL | Freq: Once | INTRAVENOUS | Status: AC | PRN
  Administered 2017-05-22: 100 mL via INTRAVENOUS

## 2017-05-22 MED ORDER — IOPAMIDOL (ISOVUE-300) INJECTION 61%
INTRAVENOUS | Status: AC
  Filled 2017-05-22: qty 100

## 2017-05-22 NOTE — Progress Notes (Signed)
[place]  Telephone:([phone]) [phone] Fax:(336) [phone]  Clinic Follow Up Note   Patient Care Team: [name], MD as PCP - General (Family Medicine) [name], MD as Consulting Physician (Hematology) [name], NP as Nurse Practitioner (Nurse Practitioner) [name], MD as Consulting Physician (General Surgery) 05/23/2017  CHIEF COMPLAINTS:  F/u bilateral breast cancer      Cancer of overlapping sites of right female breast ([place])   03/08/2017 Mammogram    IMPRESSION: 1. Patient has a is palpable mass in the lateral right breast, in the location dense fibroglandular breast tissue. Although no discrete mass is seen sonographically or mammographically, the Clinical change remains concerning. Biopsy is recommended.  RECOMMENDATION: 1. Ultrasound-guided core needle biopsy of palpable abnormality in the lateral right breast.      04/24/2017 Breast US    IMPRESSION: Ultrasound guided biopsy of the right breast. No apparent complications.      04/24/2017 Initial Biopsy    Breast, right, needle core biopsy, UOQ, centered at 9:30 o'clock - INVASIVE MAMMARY CARCINOMA. - SEE COMMENT. ER 100% positive PR 100% positive Ki67 10% HER2 positive      05/02/2017 Breast MRI    IMPRESSION: 1. 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 2. 4 mm enhancing mass with associated architectural distortion in the central left breast. This is suspicious for additional focus of carcinoma.      05/07/2017 Initial Diagnosis    Cancer of overlapping sites of right female breast ([place])      05/15/2017 Echocardiogram    ECHO 05/15/17  Impressions: - LVEF 60-65%, normal wall thickness, normal wall motion and GLS   strain, grade 1 DD, normal LV filling pressure, mild MR, normal   LA size, normal IVC.         05/22/2017 Imaging    CT CAP W Contrast 05/22/17 IMPRESSION: 1. No  specific CT findings of nodal or other metastatic disease in the chest, abdomen, and pelvis. 2. Bosniak category 90F cyst in the right mid kidney, with mild enhancement along a septation but without nodularity. Unless follow up abdominal scans related to the patient's breast cancer adequately characterize this lesion, renal protocol MRI or CT scan would be recommended in 6 months time. 3. Other imaging findings of potential clinical significance: The Aortic Atherosclerosis (ICD10-I70.0). Ectatic ascending thoracic aorta without aneurysm. Small type 1 hiatal hernia. Several small hypodense liver lesions are likely benign although technically too small to characterize. Nonobstructive 1.2 cm right renal calculus. Anterior uterine fundal myometrial mass favoring fibroid. Pelvic floor laxity with small cystocele. Notable spondylosis at L3-4 and L5-S1.       05/22/2017 Imaging    Bone Scan Whole Body 05/22/17 IMPRESSION: 1. No findings of osseous metastatic disease. 2. Lower lumbar activity corresponds to areas of significant benign spondylosis on the CT scan.       Chemotherapy    TCHP every 3 weeks for 6 cycles starting 05/24/17 followed by a year of maintenence Herceptin w/ or w/o Perjeta         Cancer of central portion of left breast ([place])   05/02/2017 Breast MRI    IMPRESSION: 1. 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 2. 4 mm enhancing mass with associated architectural distortion in the central left breast. This is suspicious for additional focus of carcinoma.  05/03/2017 Mammogram    IMPRESSION: 1. Suspicious area of architectural distortion in the left breast which corresponds to a small enhancing mass on MRI.  RECOMMENDATION: Stereotactic core needle biopsy of the area of architectural distortion in the left breast.       05/03/2017 Breast US    Stereotactic core needle biopsy  of the area of architectural distortion in the left breast.      05/03/2017 Initial Biopsy    Breast, left, needle core biopsy, central, slightly toward the lower, outer quadrant - INVASIVE DUCTAL CARCINOMA, MSBR GRADE 1/2. - SEE MICROSCOPIC DESCRIPTION. ER 50% positive PR 90% positive Ki67: 1%      05/07/2017 Initial Diagnosis    Cancer of central portion of left breast ([place])      HISTORY OF PRESENTING ILLNESS:  Madison Stokes 71 y.o. female is here because of newly diagnosed bilateral breast cancer. She was referred by Dr. Georgette Dover. She presents to clinic accompanied by her daughter in law and husband. She noticed a right breast mass in early September 2018, she underwent diagnostic right mammogram with ultrasound on 03/08/17; no discrete mass was seen sonographically or mammographically. She underwent right breast biopsy with flip placement on 04/24/17, found to be invasive mammary carcinoma, favoring lobular type. She was referred to Dr. Georgette Dover who then ordered bilateral breast MRI. Imaging identified non mass enhancement spanning 5.9 x 3.2 x 5.6 cm in the right breast as well as a small enhancing 4 mm mass in the central left breast with a small hypoechoic focus. She then underwent diagnostic mammogram with [place] and biopsy in the left breast. Pathology revealed invasive ductal carcinoma. Her last mammogram was 04/2016, no abnormal mammogram or previous breast biopsy. She has not noticed associated breast changes, nipple discharge or inversion, or skin changes. No weight loss, decreased appetite, or fatigue.   She has no significant past medical history, she is on blood pressure and cholesterol medication. She is retired Network engineer, lives with her husband; has 2 adult sons. No tobacco or alcohol history. She reports having a cold lately she caught from her grandson but otherwise feels well. Occasionally has urgent BM after meals, denies abd fullness, pain, early satiety, nausea or vomiting.   GYN  HISTORY  Menarchal: 7 LMP: age 28, menopause  Contraceptive:  HRT: None GP: 2 pregnancies, 2 births    CURRENT THERAPY: TCHP every 3 weeks for 6 cycles starting 05/24/17 followed by a year of maintenance Herceptin and Perjeta   INTERVAL HISTORY:  Delorse Shane is here for a follow up before first cycle TCHP tomorrow. She presents to the clinic today accompanied by her husband. She notes she is doing well overall. She was denied for Zofran under her insurance. She wants to know if there are other ways to get it covered. She does have compazine and is willing to try this first. She reviewed needed medication for her side effects. She denies back pain. She denies being diabetic but her sugar today was 224. She ate before coming here. She notes having ongoing discharge that she felt was a yeast infection along with itching. She will see her PCP, Dr. Kenton Kingfisher in [place] of next year.      SOCIAL HISTORY: Social History   Socioeconomic History  . Marital status: Married    Spouse name: Not on file  . Number of children: Not on file  . Years of education: Not on file  . Highest education level: Not on file  Social Needs  .  Financial resource strain: Not on file  . Food insecurity - worry: Not on file  . Food insecurity - inability: Not on file  . Transportation needs - medical: Not on file  . Transportation needs - non-medical: Not on file  Occupational History  . Not on file  Tobacco Use  . Smoking status: Never Smoker  . Smokeless tobacco: Never Used  Substance and Sexual Activity  . Alcohol use: Yes    Comment: 1 glass of wine per month  . Drug use: No  . Sexual activity: Not on file  Other Topics Concern  . Not on file  Social History Narrative  . Not on file    FAMILY HISTORY: Family History  Problem Relation Age of Onset  . Cancer Mother 55       uterine  . Cancer Brother        prostate    ALLERGIES:  has No Known Allergies.  MEDICATIONS:  Current  Outpatient Medications  Medication Sig Dispense Refill  . aspirin EC 81 MG tablet Take by mouth.    . dexamethasone (DECADRON) 4 MG tablet Take 2 tablets (8 mg total) 2 (two) times daily by mouth. Start the day before Taxotere. Then again the day after chemo for 3 days. 30 tablet 1  . fluconazole (DIFLUCAN) 100 MG tablet Take 1 tablet (100 mg total) by mouth daily. 3 tablet 1  . irbesartan (AVAPRO) 150 MG tablet     . lidocaine-prilocaine (EMLA) cream Apply to affected area once 30 g 3  . Multiple Vitamin (MULTIVITAMIN) tablet Take 1 tablet daily by mouth.    . ondansetron (ZOFRAN) 8 MG tablet Take 1 tablet (8 mg total) 2 (two) times daily as needed by mouth for refractory nausea / vomiting. Start on day 3 after chemo. 30 tablet 1  . prochlorperazine (COMPAZINE) 10 MG tablet Take 1 tablet (10 mg total) every 6 (six) hours as needed by mouth (Nausea or vomiting). 30 tablet 1  . simvastatin (ZOCOR) 40 MG tablet Take 40 mg at bedtime by mouth.     No current facility-administered medications for this visit.     REVIEW OF SYSTEMS:   Constitutional: Denies fatigue, fevers, chills or abnormal night sweats Eyes: Denies blurriness of vision, double vision or watery eyes Ears, nose, mouth, throat, and face: Denies mucositis or sore throat  Respiratory: Denies dyspnea or wheezes  Cardiovascular: Denies palpitation, chest discomfort or lower extremity swelling Gastrointestinal:  Denies nausea, vomiting, constipation, diarrhea, heartburn, abd pain or fullness, or early satiety  Vaginal (+) on going discharge  Skin: Denies abnormal skin rashes Lymphatics: Denies new lymphadenopathy or easy bruising Neurological:Denies numbness, tingling or new weaknesses Behavioral/Psych: Mood is stable, no new changes  All other systems were reviewed with the patient and are negative.  PHYSICAL EXAMINATION: ECOG PERFORMANCE STATUS: 0 - Asymptomatic  Vitals:   05/23/17 1339  BP: (!) 145/91  Pulse: 94  Resp: 18   Temp: 98.4 F (36.9 C)  SpO2: 98%   Filed Weights   05/23/17 1339  Weight: 126 lb 11.2 oz (57.5 kg)      GENERAL:alert, no distress and comfortable SKIN: skin color, texture, turgor are normal, no rashes or significant lesions EYES: normal, conjunctiva are pink and non-injected, sclera clear OROPHARYNX:no exudate, no erythema and lips, buccal mucosa, and tongue normal  NECK: supple, thyroid normal size, non-tender, without nodularity LYMPH:  no palpable cervical, supraclavicular, axillary, or inguinal lymphadenopathy  LUNGS: clear to auscultation bilaterally with normal breathing effort  HEART: regular rate & rhythm and no murmurs and no lower extremity edema ABDOMEN:abdomen soft, non-tender and normal bowel sounds. No palpable hepatomegaly  Musculoskeletal:no cyanosis of digits and no clubbing  PSYCH: alert & oriented x 3 with fluent speech NEURO: no focal motor/sensory deficits BREASTS: inspection shows them to be symmetrical without nipple discharge or inversion. Right breast with 5 cm palpable slightly tender mobile mass to upper outer breast with ecchymosis. Left breast with 1 cm palpable mass to lower central breast, nontender.  Bilateral axilla were negative for palpable adenopathy.   CBC Latest Ref Rng & Units 05/23/2017 05/13/2017  WBC 3.9 - 10.3 10e3/uL 5.1 5.4  Hemoglobin 11.6 - 15.9 g/dL 14.1 14.9  Hematocrit 34.8 - 46.6 % 43.0 45.2  Platelets 145 - 400 10e3/uL 236 241   CMP Latest Ref Rng & Units 05/23/2017 05/13/2017  Glucose 70 - 140 mg/dl 229(H) 95  BUN 7.0 - 26.0 mg/dL 17.1 17.7  Creatinine 0.6 - 1.1 mg/dL 1.1 0.8  Sodium 136 - 145 mEq/L 140 141  Potassium 3.5 - 5.1 mEq/L 4.1 4.3  CO2 22 - 29 mEq/L 20(L) 26  Calcium 8.4 - 10.4 mg/dL 9.0 9.6  Total Protein 6.4 - 8.3 g/dL 7.2 7.5  Total Bilirubin 0.20 - 1.20 mg/dL 0.28 0.47  Alkaline Phos 40 - 150 U/L 109 98  AST 5 - 34 U/L 27 28  ALT 0 - 55 U/L 33 32     CA 27.29 05/13/17: 59.5  Diagnosis 05/03/17    Breast, left, needle core biopsy, central, slightly toward the lower, outer quadrant - INVASIVE DUCTAL CARCINOMA, MSBR GRADE 1/2. - SEE MICROSCOPIC DESCRIPTION. ER 50% positive PR 90% positive Ki67 1% Microscopic Comment Breast prognostic profile will be performed. Called to [facility] on 05/06/2017.   Diagnosis 04/24/17  Breast, right, needle core biopsy, UOQ, centered at 9:30 o'clock - INVASIVE MAMMARY CARCINOMA. - SEE COMMENT. Microscopic Comment The carcinoma appears grade II. An E-cadherin and a breast prognostic profile will be performed and the results reported separately. The results were called to [facility] on 04/25/2017. (JBK:ecj 04/25/2017) Results: IMMUNOHISTOCHEMICAL AND MORPHOMETRIC ANALYSIS PERFORMED MANUALLY Estrogen Receptor: 100%, POSITIVE, STRONG STAINING INTENSITY Progesterone Receptor: 100%, POSITIVE, STRONG STAINING INTENSITY Proliferation Marker Ki67: 10% HER2: **POSITIVE**   PROCEDURES  ECHO 05/15/17  Impressions: - LVEF 60-65%, normal wall thickness, normal wall motion and GLS   strain, grade 1 DD, normal LV filling pressure, mild MR, normal   LA size, normal IVC.    RADIOGRAPHIC STUDIES: I have personally reviewed the radiological images as listed and agreed with the findings in the report.  Diagnostic Mammogram Bilateral 05/03/17  IMPRESSION: 1. 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 2. 4 mm enhancing mass with associated architectural distortion in the central left breast. This is suspicious for additional focus of Carcinoma.   Diagnostic Mammogram, right 03/08/17 IMPRESSION: 1. Patient has a is palpable mass in the 9:30 position in the lateral right breast 2 cm from the nipple, in the location dense fibroglandular breast tissue. Although no discrete mass is seen sonographically or mammographically, the Clinical  change remains concerning. Biopsy is recommended.  Ct Chest W Contrast  Result Date: 05/22/2017 CLINICAL DATA:  Bilateral breast cancer diagnosed in November 2018, staging workup. EXAM: CT CHEST, ABDOMEN, AND PELVIS WITH CONTRAST TECHNIQUE: Multidetector CT imaging of the chest, abdomen and pelvis was performed following the standard protocol  during bolus administration of intravenous contrast. CONTRAST:  182m ISOVUE-300 IOPAMIDOL (ISOVUE-300) INJECTION 61% COMPARISON:  Chest radiograph 05/14/2017 FINDINGS: CT CHEST FINDINGS Cardiovascular: Right Port-A-Cath with tip in the lower SVC. Mild atherosclerotic calcification of the aortic arch. Ectatic ascending thoracic aorta, 3.7 cm diameter, without overt aneurysm. Mediastinum/Nodes: Small axillary lymph nodes are present bilaterally and have fatty hila. Small subpectoral lymph nodes are present on the left including a 4 mm in short axis node on image 17/2. No pathologic enlarged adenopathy is identified in the chest. Small type 1 hiatal hernia is suspected. Lungs/Pleura: Mild biapical pleuroparenchymal scarring. No findings of metastatic disease to the lungs. Musculoskeletal: Unremarkable CT ABDOMEN PELVIS FINDINGS Hepatobiliary: 5 small hypodense lesions in the liver are likely benign lesions such as cysts or hemangiomas but are technically too small to characterize. The largest measures 5 by 4 mm on image 50/2. No worrisome hepatic enhancement is identified. Gallbladder unremarkable. No biliary dilatation. Pancreas: Unremarkable Spleen: Unremarkable Adrenals/Urinary Tract: Multiple bilateral renal cysts are present. [facility]. The among the right-sided cysts there is a upper pole cyst with internal septation compatible with a Bosniak category 2 lesion measuring 5.1 by 4.4 cm, and a multi septated Bosniak category 23F cystic lesion measuring 9.4 by 5.9 cm, with some linear enhancement along one of the central  septations on images 67-68 of series 2. Right mid to lower kidney oval-shaped stone measuring 1.2 cm in long axis on image 75/2. Some of the cysts have parapelvic components which indent the collecting systems. Urinary bladder unremarkable. Stomach/Bowel: Unremarkable Vascular/Lymphatic: Aortoiliac atherosclerotic vascular disease. No pathologic adenopathy is observed. Reproductive: In the anterior uterine fundus there is a fairly homogeneously enhancing 2.7 by 2.5 by 2.3 cm mass favoring an intramural fibroid. I do not perceive any endometrial thickening. Adnexa unremarkable. Other: No supplemental non-categorized findings. Musculoskeletal: Pelvic floor laxity with small cystocele shown on image 81/5. Levoconvex lumbar scoliosis with prominent focal degenerative facet arthropathy at L5-S1 and L4-5, and grade 1 degenerative anterolisthesis at L5-S1. There is right facet arthropathy at the L3-4 level along with intervertebral spurring and endplate sclerosis at the L3-4 level. Intervertebral spurring at L3-4 contributes to mild right foraminal stenosis at this level. These degenerative findings form a reasonable basis for the appearance of high activity in these regions on today's bone scan. No CT findings of osseous metastatic disease. IMPRESSION: 1. No specific CT findings of nodal or other metastatic disease in the chest, abdomen, and pelvis. 2. Bosniak category 23F cyst in the right mid kidney, with mild enhancement along a septation but without nodularity. Unless follow up abdominal scans related to the patient's breast cancer adequately characterize this lesion, renal protocol MRI or CT scan would be recommended in 6 months time. 3. Other imaging findings of potential clinical significance: The Aortic Atherosclerosis (ICD10-I70.0). Ectatic ascending thoracic aorta without aneurysm. Small type 1 hiatal hernia. Several small hypodense liver lesions are likely benign although technically too small to characterize.  Nonobstructive 1.2 cm right renal calculus. Anterior uterine fundal myometrial mass favoring fibroid. Pelvic floor laxity with small cystocele. Notable spondylosis at L3-4 and L5-S1. Electronically Signed   By: WVan ClinesM.D.   On: 05/22/2017 13:28   Nm Bone Scan Whole Body  Result Date: 05/22/2017 CLINICAL DATA:  Staging workup of breast cancer EXAM: NUCLEAR MEDICINE WHOLE BODY BONE SCAN TECHNIQUE: Whole body anterior and posterior images were obtained approximately 3 hours after intravenous injection of radiopharmaceutical. RADIOPHARMACEUTICALS:  21.4 mCi Technetium-930mDP  IV COMPARISON:  CT scans of 05/22/2017 FINDINGS: High activity along the left facet joint at L5-S1 and right intervertebral level at L3-4 corroborates with prominent degenerative findings on the CT scan and accordingly is judged benign. No other foci of accentuated bone scan activity observed. No findings of osseous metastatic disease. IMPRESSION: 1. No findings of osseous metastatic disease. 2. Lower lumbar activity corresponds to areas of significant benign spondylosis on the CT scan. Electronically Signed   By: Van Clines M.D.   On: 05/22/2017 13:30   Ct Abdomen Pelvis W Contrast  Result Date: 05/22/2017 CLINICAL DATA:  Bilateral breast cancer diagnosed in November 2018, staging workup. EXAM: CT CHEST, ABDOMEN, AND PELVIS WITH CONTRAST TECHNIQUE: Multidetector CT imaging of the chest, abdomen and pelvis was performed following the standard protocol during bolus administration of intravenous contrast. CONTRAST:  152m ISOVUE-300 IOPAMIDOL (ISOVUE-300) INJECTION 61% COMPARISON:  Chest radiograph 05/14/2017 FINDINGS: CT CHEST FINDINGS Cardiovascular: Right Port-A-Cath with tip in the lower SVC. Mild atherosclerotic calcification of the aortic arch. Ectatic ascending thoracic aorta, 3.7 cm diameter, without overt aneurysm. Mediastinum/Nodes: Small axillary lymph nodes are present bilaterally and have fatty hila. Small  subpectoral lymph nodes are present on the left including a 4 mm in short axis node on image 17/2. No pathologic enlarged adenopathy is identified in the chest. Small type 1 hiatal hernia is suspected. Lungs/Pleura: Mild biapical pleuroparenchymal scarring. No findings of metastatic disease to the lungs. Musculoskeletal: Unremarkable CT ABDOMEN PELVIS FINDINGS Hepatobiliary: 5 small hypodense lesions in the liver are likely benign lesions such as cysts or hemangiomas but are technically too small to characterize. The largest measures 5 by 4 mm on image 50/2. No worrisome hepatic enhancement is identified. Gallbladder unremarkable. No biliary dilatation. Pancreas: Unremarkable Spleen: Unremarkable Adrenals/Urinary Tract: Multiple bilateral renal cysts are present. The [facility]. The among the right-sided cysts there is a upper pole cyst with internal septation compatible with a Bosniak category 2 lesion measuring 5.1 by 4.4 cm, and a multi septated Bosniak category 70F cystic lesion measuring 9.4 by 5.9 cm, with some linear enhancement along one of the central septations on images 67-68 of series 2. Right mid to lower kidney oval-shaped stone measuring 1.2 cm in long axis on image 75/2. Some of the cysts have parapelvic components which indent the collecting systems. Urinary bladder unremarkable. Stomach/Bowel: Unremarkable Vascular/Lymphatic: Aortoiliac atherosclerotic vascular disease. No pathologic adenopathy is observed. Reproductive: In the anterior uterine fundus there is a fairly homogeneously enhancing 2.7 by 2.5 by 2.3 cm mass favoring an intramural fibroid. I do not perceive any endometrial thickening. Adnexa unremarkable. Other: No supplemental non-categorized findings. Musculoskeletal: Pelvic floor laxity with small cystocele shown on image 81/5. Levoconvex lumbar scoliosis with prominent focal degenerative facet arthropathy at L5-S1 and L4-5, and grade 1  degenerative anterolisthesis at L5-S1. There is right facet arthropathy at the L3-4 level along with intervertebral spurring and endplate sclerosis at the L3-4 level. Intervertebral spurring at L3-4 contributes to mild right foraminal stenosis at this level. These degenerative findings form a reasonable basis for the appearance of high activity in these regions on today's bone scan. No CT findings of osseous metastatic disease. IMPRESSION: 1. No specific CT findings of nodal or other metastatic disease in the chest, abdomen, and pelvis. 2. Bosniak category 70F cyst in the right mid kidney, with mild enhancement along a septation but without nodularity. Unless follow up abdominal scans related to the patient's breast cancer adequately characterize this lesion, renal protocol  MRI or CT scan would be recommended in 6 months time. 3. Other imaging findings of potential clinical significance: The Aortic Atherosclerosis (ICD10-I70.0). Ectatic ascending thoracic aorta without aneurysm. Small type 1 hiatal hernia. Several small hypodense liver lesions are likely benign although technically too small to characterize. Nonobstructive 1.2 cm right renal calculus. Anterior uterine fundal myometrial mass favoring fibroid. Pelvic floor laxity with small cystocele. Notable spondylosis at L3-4 and L5-S1. Electronically Signed   By: Van Clines M.D.   On: 05/22/2017 13:28   Mr Breast Bilateral W Wo Contrast  Result Date: 05/03/2017 CLINICAL DATA:  Patient presents for evaluation of extent of disease. Patient presented with a palpable right breast mass which had no mammographic correlate on diagnostic imaging, and if vague hypoechoic area corresponding to the palpable abnormality on ultrasound. She underwent biopsy under ultrasound guidance which revealed invasive lobular carcinoma. LABS:  Creatinine was obtained on site at [facility] at [address] Results: Creatinine 0.7 mg/dL.  GFR 83. EXAM: BILATERAL  BREAST MRI WITH AND WITHOUT CONTRAST TECHNIQUE: Multiplanar, multisequence MR images of both breasts were obtained prior to and following the intravenous administration of 9 ml of MultiHance. THREE-DIMENSIONAL MR IMAGE RENDERING ON INDEPENDENT WORKSTATION: Three-dimensional MR images were rendered by post-processing of the original MR data on an independent workstation. The three-dimensional MR images were interpreted, and findings are reported in the following complete MRI report for this study. Three dimensional images were evaluated at the independent DynaCad workstation COMPARISON:  Previous exam(s). FINDINGS: Breast composition: c. Heterogeneous fibroglandular tissue. Background parenchymal enhancement: Minimal Right breast: There is non mass enhancement throughout a significant portion of the upper outer and lower outer quadrants of the right breast, spanning 5.9 x 3.2 x 5.6 cm. No abnormal enhancement is seen medial to midline. Susceptibility artifact is present along the anterior aspect of the abnormal enhancement reflecting the post biopsy clip. Left breast: There is a small enhancing mass measuring 4 mm in the central left breast, just below midline, with a small hypoechoic focus and evidence of associated architectural distortion on the T1 weighted imaging. There are no other areas of abnormal enhancement within the left breast. Lymph nodes: No abnormal appearing lymph nodes. Ancillary findings:  None. IMPRESSION: 1. 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 2. 4 mm enhancing mass with associated architectural distortion in the central left breast. This is suspicious for additional focus of carcinoma. RECOMMENDATION: 1. Patient has not had a left breast mammogram since 05/02/2016. Recommend follow-up diagnostic left breast mammography and possible ultrasound. If this imaging demonstrates an abnormality corresponding to the  enhancing mass on MRI, then the biopsy using that modality would be recommended. If no corresponding abnormality can be seen on mammography or ultrasound, then MRI guided biopsy would be recommended. BI-RADS CATEGORY  4: Suspicious. Electronically Signed   By: Lajean Manes M.D.   On: 05/03/2017 10:39   Dg Chest Port 1 View  Result Date: 05/14/2017 CLINICAL DATA:  71 year old with recent diagnosis of bilateral breast cancer. Port-A-Cath placement. EXAM: PORTABLE CHEST 1 VIEW COMPARISON:  None. FINDINGS: Right jugular Port-A-Cath tip projects over the lower SVC. No evidence of pneumothorax or mediastinal hematoma. Expiratory image accounts for the crowded bronchovascular markings diffusely. Taking this into account, lungs are clear. No pleural effusions. Cardiac silhouette normal in size. Thoracic aorta mildly atherosclerotic. Hilar and mediastinal contours otherwise unremarkable. IMPRESSION: 1. Right jugular Port-A-Cath tip projects over the lower  SVC. No complicating features. 2. Expiratory image.  No acute cardiopulmonary disease. Electronically Signed   By: Evangeline Dakin M.D.   On: 05/14/2017 09:23   Dg C-arm 1-60 Min-no Report  Result Date: 05/14/2017 Fluoroscopy was utilized by the requesting physician.  No radiographic interpretation.   US Breast Ltd Uni Left Inc Axilla  Result Date: 05/03/2017 CLINICAL DATA:  This patient has known lobular carcinoma of the right breast, recently biopsied under ultrasound guidance. She underwent breast MRI yesterday, which demonstrated a right breast lobular carcinoma, but also showed a 4 mm enhancing mass in the central left breast associated with a subtle architectural distortion on T1 weighted images. She returns for diagnostic left breast mammography (last left breast mammogram performed on 05/02/2016) and possible ultrasound for further evaluation. EXAM: 2D DIGITAL DIAGNOSTIC LEFT MAMMOGRAM WITH CAD AND ADJUNCT TOMO ULTRASOUND LEFT BREAST COMPARISON:   Previous exam(s). ACR Breast Density Category c: The breast tissue is heterogeneously dense, which may obscure small masses. FINDINGS: On the 3D images, there is a small focus of architectural distortion in the central breast, slightly lateral to midline and slightly below the level of the nipple, which corresponds to the small enhancing mass on MRI. There is a questionable small mass in this location on the MLO view with less well-defined architectural distortion. There is no other evidence of a mass and no other areas of architectural distortion. No suspicious calcifications. Mammographic images were processed with CAD. On physical exam, no mass is palpated in the retroareolar left breast. Targeted ultrasound is performed, showing normal heterogeneous fibroglandular tissue with no discrete masses or sonographic areas of architectural distortion. Sonographic evaluation of the left axilla shows no enlarged or abnormal lymph nodes. IMPRESSION: 1. Suspicious area of architectural distortion in the left breast which corresponds to a small enhancing mass on MRI. RECOMMENDATION: Stereotactic core needle biopsy of the area of architectural distortion in the left breast. I have discussed the findings and recommendations with the patient. Results were also provided in writing at the conclusion of the visit. If applicable, a reminder letter will be sent to the patient regarding the next appointment. BI-RADS CATEGORY  4: Suspicious. Electronically Signed   By: Lajean Manes M.D.   On: 05/03/2017 10:06   Mm Diag Breast Tomo Uni Left  Result Date: 05/03/2017 CLINICAL DATA:  This patient has known lobular carcinoma of the right breast, recently biopsied under ultrasound guidance. She underwent breast MRI yesterday, which demonstrated a right breast lobular carcinoma, but also showed a 4 mm enhancing mass in the central left breast associated with a subtle architectural distortion on T1 weighted images. She returns for  diagnostic left breast mammography (last left breast mammogram performed on 05/02/2016) and possible ultrasound for further evaluation. EXAM: 2D DIGITAL DIAGNOSTIC LEFT MAMMOGRAM WITH CAD AND ADJUNCT TOMO ULTRASOUND LEFT BREAST COMPARISON:  Previous exam(s). ACR Breast Density Category c: The breast tissue is heterogeneously dense, which may obscure small masses. FINDINGS: On the 3D images, there is a small focus of architectural distortion in the central breast, slightly lateral to midline and slightly below the level of the nipple, which corresponds to the small enhancing mass on MRI. There is a questionable small mass in this location on the MLO view with less well-defined architectural distortion. There is no other evidence of a mass and no other areas of architectural distortion. No suspicious calcifications. Mammographic images were processed with CAD. On physical exam, no mass is palpated in the retroareolar left breast. Targeted ultrasound is performed,  showing normal heterogeneous fibroglandular tissue with no discrete masses or sonographic areas of architectural distortion. Sonographic evaluation of the left axilla shows no enlarged or abnormal lymph nodes. IMPRESSION: 1. Suspicious area of architectural distortion in the left breast which corresponds to a small enhancing mass on MRI. RECOMMENDATION: Stereotactic core needle biopsy of the area of architectural distortion in the left breast. I have discussed the findings and recommendations with the patient. Results were also provided in writing at the conclusion of the visit. If applicable, a reminder letter will be sent to the patient regarding the next appointment. BI-RADS CATEGORY  4: Suspicious. Electronically Signed   By: Lajean Manes M.D.   On: 05/03/2017 10:06   Mm Clip Placement Left  Result Date: 05/03/2017 CLINICAL DATA:  Status post stereotactic core needle biopsy of an area of architectural distortion in the left breast is EXAM: DIAGNOSTIC  LEFT MAMMOGRAM POST STEREOTACTIC BIOPSY COMPARISON:  Previous exam(s). FINDINGS: Mammographic images were obtained following stereotactic guided biopsy of a left breast architectural distortion. The coil shaped biopsy clip lies inferior, approximately 2-2.5 cm, to the area of distortion on the MLO view and 5 mm posterior to the distortion on the CC view. IMPRESSION: Clip films following stereotactic core needle biopsy of the left breast. Coil shaped clip is displaced inferiorly and slightly posteriorly as detailed above. Final Assessment: Post Procedure Mammograms for Marker Placement Electronically Signed   By: Lajean Manes M.D.   On: 05/03/2017 10:56   Mm Clip Placement Right  Result Date: 04/24/2017 CLINICAL DATA:  Status post ultrasound-guided core needle biopsy of a palpable lump in the upper outer right breast. EXAM: DIAGNOSTIC RIGHT MAMMOGRAM POST ULTRASOUND BIOPSY COMPARISON:  Previous exam(s). FINDINGS: Mammographic images were obtained following ultrasound guided biopsy of the palpable lump in the upper outer quadrant, centered at the 9:30 o'clock position, which corresponding ill-defined hypoechogenicity. The ribbon shaped biopsy clip lies within the central area of fibroglandular density in the upper outer right breast. IMPRESSION: Well-positioned ribbon shaped biopsy clip following ultrasound-guided core needle biopsy of the right breast. Final Assessment: Post Procedure Mammograms for Marker Placement Electronically Signed   By: Lajean Manes M.D.   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 This was found to be concordant by Dr. Lajean Manes. Pathology results were discussed with the patient by telephone. The patient reported doing well after the  biopsy with tenderness at the site. Post biopsy instructions and care were reviewed and questions were answered. The patient was encouraged to call [facility] for any additional concerns. The patient has a recent diagnosis of right breast cancer and should follow her outlined treatment plan. Pathology results were sent to Dr. [name] via [facility] message. Pathology results reported by Terie Purser, RN on 05/06/2017. Electronically Signed   By: Lajean Manes M.D.   On: 05/06/2017 14:24   Result Date: 05/06/2017 CLINICAL DATA:  Patient presents for stereotactic core needle biopsy of a small focus of architectural distortion in the left breast centrally, slightly towards the lower outer quadrant. This corresponds to a small enhancing mass on MRI. The patient has known lobular carcinoma of the right breast. EXAM: LEFT BREAST STEREOTACTIC CORE NEEDLE BIOPSY COMPARISON:  Previous exams. FINDINGS: The patient and I  discussed the procedure of stereotactic-guided biopsy including benefits and alternatives. We discussed the high likelihood of a successful procedure. We discussed the risks of the procedure including infection, bleeding, tissue injury, clip migration, and inadequate sampling. Informed written consent was given. The usual time out protocol was performed immediately prior to the procedure. Using sterile technique and 1% Lidocaine as local anesthetic, under stereotactic guidance, a 9 gauge vacuum assisted device was used to perform core needle biopsy of the area of architectural distortion in the left breast using a superior approach. Lesion quadrant: Lower outer quadrant At the conclusion of the procedure, a coil shaped tissue marker clip was deployed into the biopsy cavity. Follow-up 2-view mammogram was performed and dictated separately. IMPRESSION: Stereotactic-guided biopsy of the left breast. No apparent complications. Electronically Signed: By: Lajean Manes M.D. On:  05/03/2017 10:47   [place] Rt Breast Bx W Loc Dev 1st Lesion Img Bx Spec US Guide  Addendum Date: 04/25/2017   ADDENDUM REPORT: 04/25/2017 14:36 ADDENDUM: Pathology revealed GRADE II INVASIVE MAMMARY CARCINOMA of the Right breast, upper outer quadrant, centered at 9:30 o'clock. This was found to be concordant by Dr. Lajean Manes. Pathology results were discussed with the patient by telephone. The patient reported doing well after the biopsy with tenderness at the site. Post biopsy instructions and care were reviewed and questions were answered. The patient was encouraged to call [facility] for any additional concerns. Surgical consultation has been arranged with Dr. [name] at [facility] on April 29, 2017. Pathology results reported by Terie Purser, RN on 04/25/2017. Electronically Signed   By: Lajean Manes M.D.   On: 04/25/2017 14:36   Result Date: 04/25/2017 CLINICAL DATA:  Patient presents for ultrasound-guided core needle biopsy of a new palpable lump in the lateral to upper outer aspect of the right breast. On the diagnostic images, there was an ill-defined area of hypoechogenicity corresponding to the palpable lump. EXAM: ULTRASOUND GUIDED RIGHT BREAST CORE NEEDLE BIOPSY COMPARISON:  Previous exam(s). FINDINGS: I met with the patient and we discussed the procedure of ultrasound-guided biopsy, including benefits and alternatives. We discussed the high likelihood of a successful procedure. We discussed the risks of the procedure, including infection, bleeding, tissue injury, clip migration, and inadequate sampling. Informed written consent was given. The usual time-out protocol was performed immediately prior to the procedure. Lesion quadrant: Upper outer quadrant Using sterile technique and 1% Lidocaine as local anesthetic, under direct ultrasound visualization, a 12 gauge spring-loaded device was used to perform biopsy of the palpable lump and corresponding  ill-defined area of hypoechogenicity in the upper outer quadrant, centered at 9:30 o'clock, using an inferior approach. At the conclusion of the procedure a ribbon shaped tissue marker clip was deployed into the biopsy cavity. Follow up 2 view mammogram was performed and dictated separately. IMPRESSION: Ultrasound guided biopsy of the right breast. No apparent complications. Electronically Signed: By: Lajean Manes M.D. On: 04/24/2017 13:22    ASSESSMENT & PLAN:  71 y.o. caucasian postmenopausal woman   1.  Cancer of overlapping sites of right breast, invasive lobular carcinoma, Stage IB cT3, cN0, cM0, G2; ER positive, PR positive, HER2 positive  2.  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 In her right breast, she has large area of disease that is HER2 positive. -Due to the high risk of  recurrence from right breast cancer, she would benefit from neoadjuvant or adjuvant chemotherapy.  I recommend neoadjuvant chemotherapy to shrink her right breast cancer, to see if lumpectomy would be more feasible after neoadjuvant chemotherapy TCHP every 3 weeks for 6 cycles, followed by maintenance Herceptin with or without Perjeta to complete 1 year therapy. The goal of therapy is curative.  Discussed the benefits and potential side effects from chemo, she is agreeable. I will discuss with her surgeon Dr. Georgette Dover.   -We also reviewed that lobular carcinoma is less sensitive to chemotherapy, will monitor her disease closely during the chemo, and likely get a interim scan to confirm her response to chemo. -Baseline CA 27.29 was 59.5, elevated   -I discussed her CT CAP and Bone scan from 05/22/17 shows no evidence of metastasis. There was incidental findings of 20F cyst in her right kidney and a non-obstructing kidney stone, will monitor.  -Baseline ECHO from 04/2017 is  normal.  -She previously spoke with research RN about clinical trail, patient declined at this time.  -She is fine to continue multivitamins and probiotics. I suggest she try compazine as needed or before each meal before trying to get Zofran approved.  -Labs reviewed, and overall adequate to proceed with chemo TCHP tomorrow with Onpro. I suggest she use OTC Claritin for 5 days after her infusion for possible bone pain.  -The random blood glucose was 229 today.  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  I will reduce her dexamethasone dose by half. I suggest she increase water intake and watch her diet.  We discussed diabetic diet and exercise. -I encouraged her to contact us if she develops a low grade fever of 100.56F or above or significant side effects to contact clinic or the ED.  -F/u in 1 week   2. Genetics  -I reached out to genetics counselor to see if patient qualifies for genetics referral, she has synchronous bilateral ductal and lobular carcinoma, her brother has prostate cancer and her mother had uterine cancer in her 74's. I will follow up.   3. 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   4. Borderline DM, yeast infection symptoms -I will reduce her dexa dose to 57m BID instead of 866mBID around her chemo  -I encouraged her to increase water intake and watch her diet -Will check HB1c.  -will copy note to Dr. HaKenton Kingfishero provide her with  more information of diabetic education.  -I will called in fluconazole today (05/23/17), to take one dose to treat.  -If she has persistent hypoglycemia I will recommend her to start, diabetic medication, such as metformin.   PLAN: -Copy note to Dr. HaKenton Kingfisherbout her hyperglycemia -Prescribe Fluconazole today for her vaginal yeast infection -decrease Dexamethasone dose  -Labs reviewed, adequate for treatment, she will start first cycle of  TCMomeyer tomorrow -lab, flush, and f/u with me or Lacie with chemo TCHP the next day morning, every 3 weeks X5, starting on 12/20 -Lab, flush and f/u with me or Lacie on 12/7 around noon     Orders Placed This Encounter  Procedures  . CA 27.29    Standing Status:   Future    Number of Occurrences:   1    Standing Expiration Date:   05/22/2018    All questions were answered. The patient knows to call the clinic with any problems, questions or concerns.  [name], MD 05/23/2017   This document serves as a record of services personally performed by [name], MD. It was created on her behalf by Joslyn Devon, a trained medical scribe. The creation of this record is based on the scribe's personal observations and the provider's statements to them.    I have reviewed the above documentation for accuracy and completeness, and I agree with the above.

## 2017-05-22 NOTE — Progress Notes (Signed)
Pt's Ondansetron was denied.  Gave denial to the nurse.

## 2017-05-23 ENCOUNTER — Ambulatory Visit: Payer: Medicare Other

## 2017-05-23 ENCOUNTER — Telehealth: Payer: Self-pay | Admitting: Hematology

## 2017-05-23 ENCOUNTER — Ambulatory Visit (HOSPITAL_BASED_OUTPATIENT_CLINIC_OR_DEPARTMENT_OTHER): Payer: Medicare Other | Admitting: Hematology

## 2017-05-23 ENCOUNTER — Other Ambulatory Visit (HOSPITAL_BASED_OUTPATIENT_CLINIC_OR_DEPARTMENT_OTHER): Payer: Medicare Other

## 2017-05-23 ENCOUNTER — Ambulatory Visit (HOSPITAL_COMMUNITY): Admission: RE | Admit: 2017-05-23 | Payer: Medicare Other | Source: Ambulatory Visit

## 2017-05-23 ENCOUNTER — Encounter: Payer: Self-pay | Admitting: Hematology

## 2017-05-23 VITALS — BP 145/91 | HR 94 | Temp 98.4°F | Resp 18 | Ht 62.0 in | Wt 126.7 lb

## 2017-05-23 DIAGNOSIS — C50112 Malignant neoplasm of central portion of left female breast: Secondary | ICD-10-CM | POA: Diagnosis not present

## 2017-05-23 DIAGNOSIS — N289 Disorder of kidney and ureter, unspecified: Secondary | ICD-10-CM | POA: Diagnosis not present

## 2017-05-23 DIAGNOSIS — R7303 Prediabetes: Secondary | ICD-10-CM

## 2017-05-23 DIAGNOSIS — R739 Hyperglycemia, unspecified: Secondary | ICD-10-CM | POA: Diagnosis not present

## 2017-05-23 DIAGNOSIS — C50811 Malignant neoplasm of overlapping sites of right female breast: Secondary | ICD-10-CM | POA: Diagnosis not present

## 2017-05-23 DIAGNOSIS — Z170 Estrogen receptor positive status [ER+]: Secondary | ICD-10-CM

## 2017-05-23 DIAGNOSIS — N898 Other specified noninflammatory disorders of vagina: Secondary | ICD-10-CM | POA: Diagnosis not present

## 2017-05-23 DIAGNOSIS — T380X5A Adverse effect of glucocorticoids and synthetic analogues, initial encounter: Secondary | ICD-10-CM

## 2017-05-23 DIAGNOSIS — I10 Essential (primary) hypertension: Secondary | ICD-10-CM

## 2017-05-23 LAB — CBC WITH DIFFERENTIAL/PLATELET
BASO%: 0.2 % (ref 0.0–2.0)
Basophils Absolute: 0 10*3/uL (ref 0.0–0.1)
EOS%: 0 % (ref 0.0–7.0)
Eosinophils Absolute: 0 10*3/uL (ref 0.0–0.5)
HCT: 43 % (ref 34.8–46.6)
HGB: 14.1 g/dL (ref 11.6–15.9)
LYMPH%: 8.9 % — ABNORMAL LOW (ref 14.0–49.7)
MCH: 30.7 pg (ref 25.1–34.0)
MCHC: 32.8 g/dL (ref 31.5–36.0)
MCV: 93.5 fL (ref 79.5–101.0)
MONO#: 0.1 10*3/uL (ref 0.1–0.9)
MONO%: 1 % (ref 0.0–14.0)
NEUT#: 4.6 10*3/uL (ref 1.5–6.5)
NEUT%: 89.9 % — ABNORMAL HIGH (ref 38.4–76.8)
Platelets: 236 10*3/uL (ref 145–400)
RBC: 4.6 10*6/uL (ref 3.70–5.45)
RDW: 13.4 % (ref 11.2–14.5)
WBC: 5.1 10*3/uL (ref 3.9–10.3)
lymph#: 0.5 10*3/uL — ABNORMAL LOW (ref 0.9–3.3)

## 2017-05-23 LAB — COMPREHENSIVE METABOLIC PANEL
ALT: 33 U/L (ref 0–55)
AST: 27 U/L (ref 5–34)
Albumin: 4 g/dL (ref 3.5–5.0)
Alkaline Phosphatase: 109 U/L (ref 40–150)
Anion Gap: 12 mEq/L — ABNORMAL HIGH (ref 3–11)
BUN: 17.1 mg/dL (ref 7.0–26.0)
CO2: 20 mEq/L — ABNORMAL LOW (ref 22–29)
Calcium: 9 mg/dL (ref 8.4–10.4)
Chloride: 107 mEq/L (ref 98–109)
Creatinine: 1.1 mg/dL (ref 0.6–1.1)
EGFR: 54 mL/min/{1.73_m2} — ABNORMAL LOW (ref >60)
Glucose: 229 mg/dl — ABNORMAL HIGH (ref 70–140)
Potassium: 4.1 mEq/L (ref 3.5–5.1)
Sodium: 140 mEq/L (ref 136–145)
Total Bilirubin: 0.28 mg/dL (ref 0.20–1.20)
Total Protein: 7.2 g/dL (ref 6.4–8.3)

## 2017-05-23 MED ORDER — HEPARIN SOD (PORK) LOCK FLUSH 100 UNIT/ML IV SOLN
500.0000 [IU] | Freq: Once | INTRAVENOUS | Status: AC
  Administered 2017-05-23: 500 [IU]
  Filled 2017-05-23: qty 5

## 2017-05-23 MED ORDER — FLUCONAZOLE 100 MG PO TABS: 100.0000 mg | ORAL_TABLET | Freq: Every day | ORAL | 1 refills | Status: DC

## 2017-05-23 MED ORDER — SODIUM CHLORIDE 0.9% FLUSH
10.0000 mL | Freq: Once | INTRAVENOUS | Status: AC
  Administered 2017-05-23: 10 mL
  Filled 2017-05-23: qty 10

## 2017-05-23 NOTE — Progress Notes (Signed)
Met w/ pt to introduce myself as her Arboriculturist.  Pt has 2 insurances so her treatment should be covered at 100%.  I informed her of the [employer], went over what it covers and gave her the income requirement.  She informed me that she exceeds the income requirement so she wouldn't qualify for the grant.  She has my card for any questions or concerns she may have in the future.

## 2017-05-23 NOTE — Telephone Encounter (Signed)
I did call pharmacy about treatment time

## 2017-05-23 NOTE — Telephone Encounter (Signed)
Gave avs and calendar for December - February 2019 °

## 2017-05-24 ENCOUNTER — Ambulatory Visit (HOSPITAL_BASED_OUTPATIENT_CLINIC_OR_DEPARTMENT_OTHER): Payer: Medicare Other

## 2017-05-24 ENCOUNTER — Encounter: Payer: Self-pay | Admitting: *Deleted

## 2017-05-24 ENCOUNTER — Other Ambulatory Visit: Payer: Self-pay | Admitting: *Deleted

## 2017-05-24 VITALS — BP 115/69 | HR 70 | Temp 98.5°F | Resp 17

## 2017-05-24 DIAGNOSIS — C50411 Malignant neoplasm of upper-outer quadrant of right female breast: Secondary | ICD-10-CM | POA: Diagnosis not present

## 2017-05-24 DIAGNOSIS — Z5112 Encounter for antineoplastic immunotherapy: Secondary | ICD-10-CM | POA: Diagnosis not present

## 2017-05-24 DIAGNOSIS — C50811 Malignant neoplasm of overlapping sites of right female breast: Secondary | ICD-10-CM

## 2017-05-24 DIAGNOSIS — C50112 Malignant neoplasm of central portion of left female breast: Secondary | ICD-10-CM

## 2017-05-24 DIAGNOSIS — Z170 Estrogen receptor positive status [ER+]: Secondary | ICD-10-CM

## 2017-05-24 DIAGNOSIS — Z5111 Encounter for antineoplastic chemotherapy: Secondary | ICD-10-CM

## 2017-05-24 MED ORDER — HEPARIN SOD (PORK) LOCK FLUSH 100 UNIT/ML IV SOLN
500.0000 [IU] | Freq: Once | INTRAVENOUS | Status: AC | PRN
  Administered 2017-05-24: 500 [IU]
  Filled 2017-05-24: qty 5

## 2017-05-24 MED ORDER — DIPHENHYDRAMINE HCL 25 MG PO CAPS
ORAL_CAPSULE | ORAL | Status: AC
  Filled 2017-05-24: qty 2

## 2017-05-24 MED ORDER — PALONOSETRON HCL INJECTION 0.25 MG/5ML
0.2500 mg | Freq: Once | INTRAVENOUS | Status: AC
Start: 2017-05-24 — End: 2017-05-24
  Administered 2017-05-24: 0.25 mg via INTRAVENOUS

## 2017-05-24 MED ORDER — PALONOSETRON HCL INJECTION 0.25 MG/5ML
INTRAVENOUS | Status: AC
  Filled 2017-05-24: qty 5

## 2017-05-24 MED ORDER — ONDANSETRON 8 MG PO TBDP: 8.0000 mg | ORAL_TABLET | Freq: Three times a day (TID) | ORAL | 0 refills | Status: DC | PRN

## 2017-05-24 MED ORDER — SODIUM CHLORIDE 0.9% FLUSH
10.0000 mL | INTRAVENOUS | Status: DC | PRN
  Administered 2017-05-24: 10 mL
  Filled 2017-05-24: qty 10

## 2017-05-24 MED ORDER — ACETAMINOPHEN 325 MG PO TABS
650.0000 mg | ORAL_TABLET | Freq: Once | ORAL | Status: AC
  Administered 2017-05-24: 650 mg via ORAL

## 2017-05-24 MED ORDER — SODIUM CHLORIDE 0.9 % IV SOLN
Freq: Once | INTRAVENOUS | Status: AC
  Administered 2017-05-24: 13:00:00 via INTRAVENOUS
  Filled 2017-05-24: qty 5

## 2017-05-24 MED ORDER — ACETAMINOPHEN 325 MG PO TABS
ORAL_TABLET | ORAL | Status: AC
  Filled 2017-05-24: qty 2

## 2017-05-24 MED ORDER — TRASTUZUMAB CHEMO 150 MG IV SOLR
8.0000 mg/kg | Freq: Once | INTRAVENOUS | Status: AC
  Administered 2017-05-24: 462 mg via INTRAVENOUS
  Filled 2017-05-24: qty 22

## 2017-05-24 MED ORDER — SODIUM CHLORIDE 0.9 % IV SOLN
410.0000 mg | Freq: Once | INTRAVENOUS | Status: AC
  Administered 2017-05-24: 410 mg via INTRAVENOUS
  Filled 2017-05-24: qty 41

## 2017-05-24 MED ORDER — PEGFILGRASTIM 6 MG/0.6ML ~~LOC~~ PSKT
6.0000 mg | PREFILLED_SYRINGE | Freq: Once | SUBCUTANEOUS | Status: AC
  Administered 2017-05-24: 6 mg via SUBCUTANEOUS
  Filled 2017-05-24: qty 0.6

## 2017-05-24 MED ORDER — SODIUM CHLORIDE 0.9 % IV SOLN
75.0000 mg/m2 | Freq: Once | INTRAVENOUS | Status: AC
  Administered 2017-05-24: 120 mg via INTRAVENOUS
  Filled 2017-05-24: qty 12

## 2017-05-24 MED ORDER — SODIUM CHLORIDE 0.9 % IV SOLN
840.0000 mg | Freq: Once | INTRAVENOUS | Status: AC
  Administered 2017-05-24: 840 mg via INTRAVENOUS
  Filled 2017-05-24: qty 28

## 2017-05-24 MED ORDER — DIPHENHYDRAMINE HCL 25 MG PO CAPS
50.0000 mg | ORAL_CAPSULE | Freq: Once | ORAL | Status: AC
  Administered 2017-05-24: 50 mg via ORAL

## 2017-05-24 MED ORDER — SODIUM CHLORIDE 0.9 % IV SOLN
Freq: Once | INTRAVENOUS | Status: AC
  Administered 2017-05-24: 08:00:00 via INTRAVENOUS

## 2017-05-24 NOTE — Patient Instructions (Addendum)
[facility] Discharge Instructions for Patients Receiving Chemotherapy  Today you received the following chemotherapy agents: Herceptin, Perjeta, Taxotere and Carboplatin   To help prevent nausea and vomiting after your treatment, we encourage you to take your nausea medication as directed.    If you develop nausea and vomiting that is not controlled by your nausea medication, call the clinic.   BELOW ARE SYMPTOMS THAT SHOULD BE REPORTED IMMEDIATELY:  *FEVER GREATER THAN 100.5 F  *CHILLS WITH OR WITHOUT FEVER  NAUSEA AND VOMITING THAT IS NOT CONTROLLED WITH YOUR NAUSEA MEDICATION  *UNUSUAL SHORTNESS OF BREATH  *UNUSUAL BRUISING OR BLEEDING  TENDERNESS IN MOUTH AND THROAT WITH OR WITHOUT PRESENCE OF ULCERS  *URINARY PROBLEMS  *BOWEL PROBLEMS  UNUSUAL RASH Items with * indicate a potential emergency and should be followed up as soon as possible.  Feel free to call the clinic should you have any questions or concerns. The clinic phone number is [phone].  Please show the [place] at check-in to the Emergency Department and triage nurse.    Trastuzumab injection for infusion What is this medicine? TRASTUZUMAB (tras TOO zoo mab) is a monoclonal antibody. It is used to treat breast cancer and stomach cancer. This medicine may be used for other purposes; ask your health care provider or pharmacist if you have questions. COMMON BRAND NAME(S): Herceptin What should I tell my health care provider before I take this medicine? They need to know if you have any of these conditions: -heart disease -heart failure -lung or breathing disease, like asthma -an unusual or allergic reaction to trastuzumab, benzyl alcohol, or other medications, foods, dyes, or preservatives -pregnant or trying to get pregnant -breast-feeding How should I use this medicine? This drug is given as an infusion into a vein. It is administered in a hospital or clinic by a specially  trained health care professional. Talk to your pediatrician regarding the use of this medicine in children. This medicine is not approved for use in children. Overdosage: If you think you have taken too much of this medicine contact a poison control center or emergency room at once. NOTE: This medicine is only for you. Do not share this medicine with others. What if I miss a dose? It is important not to miss a dose. Call your doctor or health care professional if you are unable to keep an appointment. What may interact with this medicine? This medicine may interact with the following medications: -certain types of chemotherapy, such as daunorubicin, doxorubicin, epirubicin, and idarubicin This list may not describe all possible interactions. Give your health care provider a list of all the medicines, herbs, non-prescription drugs, or dietary supplements you use. Also tell them if you smoke, drink alcohol, or use illegal drugs. Some items may interact with your medicine. What should I watch for while using this medicine? Visit your doctor for checks on your progress. Report any side effects. Continue your course of treatment even though you feel ill unless your doctor tells you to stop. Call your doctor or health care professional for advice if you get a fever, chills or sore throat, or other symptoms of a cold or flu. Do not treat yourself. Try to avoid being around people who are sick. You may experience fever, chills and shaking during your first infusion. These effects are usually mild and can be treated with other medicines. Report any side effects during the infusion to your health care professional. Fever and chills usually do not happen  with later infusions. Do not become pregnant while taking this medicine or for 7 months after stopping it. Women should inform their doctor if they wish to become pregnant or think they might be pregnant. Women of child-bearing potential will need to have a  negative pregnancy test before starting this medicine. There is a potential for serious side effects to an unborn child. Talk to your health care professional or pharmacist for more information. Do not breast-feed an infant while taking this medicine or for 7 months after stopping it. Women must use effective birth control with this medicine. What side effects may I notice from receiving this medicine? Side effects that you should report to your doctor or health care professional as soon as possible: -allergic reactions like skin rash, itching or hives, swelling of the face, lips, or tongue -chest pain or palpitations -cough -dizziness -feeling faint or lightheaded, falls -fever -general ill feeling or flu-like symptoms -signs of worsening heart failure like breathing problems; swelling in your legs and feet -unusually weak or tired Side effects that usually do not require medical attention (report to your doctor or health care professional if they continue or are bothersome): -bone pain -changes in taste -diarrhea -joint pain -nausea/vomiting -weight loss This list may not describe all possible side effects. Call your doctor for medical advice about side effects. You may report side effects to FDA at 1-800-FDA-1088. Where should I keep my medicine? This drug is given in a hospital or clinic and will not be stored at home. NOTE: This sheet is a summary. It may not cover all possible information. If you have questions about this medicine, talk to your doctor, pharmacist, or health care provider.  2018 Elsevier/Gold Standard (2016-06-05 14:37:52)    Pertuzumab injection What is this medicine? PERTUZUMAB (per TOOZ ue mab) is a monoclonal antibody. It is used to treat breast cancer. This medicine may be used for other purposes; ask your health care provider or pharmacist if you have questions. COMMON BRAND NAME(S): PERJETA What should I tell my health care provider before I take this  medicine? They need to know if you have any of these conditions: -heart disease -heart failure -high blood pressure -history of irregular heart beat -recent or ongoing radiation therapy -an unusual or allergic reaction to pertuzumab, other medicines, foods, dyes, or preservatives -pregnant or trying to get pregnant -breast-feeding How should I use this medicine? This medicine is for infusion into a vein. It is given by a health care professional in a hospital or clinic setting. Talk to your pediatrician regarding the use of this medicine in children. Special care may be needed. Overdosage: If you think you have taken too much of this medicine contact a poison control center or emergency room at once. NOTE: This medicine is only for you. Do not share this medicine with others. What if I miss a dose? It is important not to miss your dose. Call your doctor or health care professional if you are unable to keep an appointment. What may interact with this medicine? Interactions are not expected. Give your health care provider a list of all the medicines, herbs, non-prescription drugs, or dietary supplements you use. Also tell them if you smoke, drink alcohol, or use illegal drugs. Some items may interact with your medicine. This list may not describe all possible interactions. Give your health care provider a list of all the medicines, herbs, non-prescription drugs, or dietary supplements you use. Also tell them if you smoke, drink alcohol, or  use illegal drugs. Some items may interact with your medicine. What should I watch for while using this medicine? Your condition will be monitored carefully while you are receiving this medicine. Report any side effects. Continue your course of treatment even though you feel ill unless your doctor tells you to stop. Do not become pregnant while taking this medicine or for 7 months after stopping it. Women should inform their doctor if they wish to become  pregnant or think they might be pregnant. Women of child-bearing potential will need to have a negative pregnancy test before starting this medicine. There is a potential for serious side effects to an unborn child. Talk to your health care professional or pharmacist for more information. Do not breast-feed an infant while taking this medicine or for 7 months after stopping it. Women must use effective birth control with this medicine. Call your doctor or health care professional for advice if you get a fever, chills or sore throat, or other symptoms of a cold or flu. Do not treat yourself. Try to avoid being around people who are sick. You may experience fever, chills, and headache during the infusion. Report any side effects during the infusion to your health care professional. What side effects may I notice from receiving this medicine? Side effects that you should report to your doctor or health care professional as soon as possible: -breathing problems -chest pain or palpitations -dizziness -feeling faint or lightheaded -fever or chills -skin rash, itching or hives -sore throat -swelling of the face, lips, or tongue -swelling of the legs or ankles -unusually weak or tired Side effects that usually do not require medical attention (report to your doctor or health care professional if they continue or are bothersome): -diarrhea -hair loss -nausea, vomiting -tiredness This list may not describe all possible side effects. Call your doctor for medical advice about side effects. You may report side effects to FDA at 1-800-FDA-1088. Where should I keep my medicine? This drug is given in a hospital or clinic and will not be stored at home. NOTE: This sheet is a summary. It may not cover all possible information. If you have questions about this medicine, talk to your doctor, pharmacist, or health care provider.  2018 Elsevier/Gold Standard (2015-07-14 12:08:50)    Docetaxel injection What  is this medicine? DOCETAXEL (doe se TAX el) is a chemotherapy drug. It targets fast dividing cells, like cancer cells, and causes these cells to die. This medicine is used to treat many types of cancers like breast cancer, certain stomach cancers, head and neck cancer, lung cancer, and prostate cancer. This medicine may be used for other purposes; ask your health care provider or pharmacist if you have questions. COMMON BRAND NAME(S): Docefrez, Taxotere What should I tell my health care provider before I take this medicine? They need to know if you have any of these conditions: -infection (especially a virus infection such as chickenpox, cold sores, or herpes) -liver disease -low blood counts, like low white cell, platelet, or red cell counts -an unusual or allergic reaction to docetaxel, polysorbate 80, other chemotherapy agents, other medicines, foods, dyes, or preservatives -pregnant or trying to get pregnant -breast-feeding How should I use this medicine? This drug is given as an infusion into a vein. It is administered in a hospital or clinic by a specially trained health care professional. Talk to your pediatrician regarding the use of this medicine in children. Special care may be needed. Overdosage: If you think you have taken  too much of this medicine contact a poison control center or emergency room at once. NOTE: This medicine is only for you. Do not share this medicine with others. What if I miss a dose? It is important not to miss your dose. Call your doctor or health care professional if you are unable to keep an appointment. What may interact with this medicine? -cyclosporine -erythromycin -ketoconazole -medicines to increase blood counts like filgrastim, pegfilgrastim, sargramostim -vaccines Talk to your doctor or health care professional before taking any of these medicines: -acetaminophen -aspirin -ibuprofen -ketoprofen -naproxen This list may not describe all  possible interactions. Give your health care provider a list of all the medicines, herbs, non-prescription drugs, or dietary supplements you use. Also tell them if you smoke, drink alcohol, or use illegal drugs. Some items may interact with your medicine. What should I watch for while using this medicine? Your condition will be monitored carefully while you are receiving this medicine. You will need important blood work done while you are taking this medicine. This drug may make you feel generally unwell. This is not uncommon, as chemotherapy can affect healthy cells as well as cancer cells. Report any side effects. Continue your course of treatment even though you feel ill unless your doctor tells you to stop. In some cases, you may be given additional medicines to help with side effects. Follow all directions for their use. Call your doctor or health care professional for advice if you get a fever, chills or sore throat, or other symptoms of a cold or flu. Do not treat yourself. This drug decreases your body's ability to fight infections. Try to avoid being around people who are sick. This medicine may increase your risk to bruise or bleed. Call your doctor or health care professional if you notice any unusual bleeding. This medicine may contain alcohol in the product. You may get drowsy or dizzy. Do not drive, use machinery, or do anything that needs mental alertness until you know how this medicine affects you. Do not stand or sit up quickly, especially if you are an older patient. This reduces the risk of dizzy or fainting spells. Avoid alcoholic drinks. Do not become pregnant while taking this medicine. Women should inform their doctor if they wish to become pregnant or think they might be pregnant. There is a potential for serious side effects to an unborn child. Talk to your health care professional or pharmacist for more information. Do not breast-feed an infant while taking this medicine. What  side effects may I notice from receiving this medicine? Side effects that you should report to your doctor or health care professional as soon as possible: -allergic reactions like skin rash, itching or hives, swelling of the face, lips, or tongue -low blood counts - This drug may decrease the number of white blood cells, red blood cells and platelets. You may be at increased risk for infections and bleeding. -signs of infection - fever or chills, cough, sore throat, pain or difficulty passing urine -signs of decreased platelets or bleeding - bruising, pinpoint red spots on the skin, black, tarry stools, nosebleeds -signs of decreased red blood cells - unusually weak or tired, fainting spells, lightheadedness -breathing problems -fast or irregular heartbeat -low blood pressure -mouth sores -nausea and vomiting -pain, swelling, redness or irritation at the injection site -pain, tingling, numbness in the hands or feet -swelling of the ankle, feet, hands -weight gain Side effects that usually do not require medical attention (report to your doctor  or health care professional if they continue or are bothersome): -bone pain -complete hair loss including hair on your head, underarms, pubic hair, eyebrows, and eyelashes -diarrhea -excessive tearing -changes in the color of fingernails -loosening of the fingernails -nausea -muscle pain -red flush to skin -sweating -weak or tired This list may not describe all possible side effects. Call your doctor for medical advice about side effects. You may report side effects to FDA at 1-800-FDA-1088. Where should I keep my medicine? This drug is given in a hospital or clinic and will not be stored at home. NOTE: This sheet is a summary. It may not cover all possible information. If you have questions about this medicine, talk to your doctor, pharmacist, or health care provider.  2018 Elsevier/Gold Standard (2015-07-14 12:32:56)   Carboplatin  injection What is this medicine? CARBOPLATIN (KAR boe pla tin) is a chemotherapy drug. It targets fast dividing cells, like cancer cells, and causes these cells to die. This medicine is used to treat ovarian cancer and many other cancers. This medicine may be used for other purposes; ask your health care provider or pharmacist if you have questions. COMMON BRAND NAME(S): Paraplatin What should I tell my health care provider before I take this medicine? They need to know if you have any of these conditions: -blood disorders -hearing problems -kidney disease -recent or ongoing radiation therapy -an unusual or allergic reaction to carboplatin, cisplatin, other chemotherapy, other medicines, foods, dyes, or preservatives -pregnant or trying to get pregnant -breast-feeding How should I use this medicine? This drug is usually given as an infusion into a vein. It is administered in a hospital or clinic by a specially trained health care professional. Talk to your pediatrician regarding the use of this medicine in children. Special care may be needed. Overdosage: If you think you have taken too much of this medicine contact a poison control center or emergency room at once. NOTE: This medicine is only for you. Do not share this medicine with others. What if I miss a dose? It is important not to miss a dose. Call your doctor or health care professional if you are unable to keep an appointment. What may interact with this medicine? -medicines for seizures -medicines to increase blood counts like filgrastim, pegfilgrastim, sargramostim -some antibiotics like amikacin, gentamicin, neomycin, streptomycin, tobramycin -vaccines Talk to your doctor or health care professional before taking any of these medicines: -acetaminophen -aspirin -ibuprofen -ketoprofen -naproxen This list may not describe all possible interactions. Give your health care provider a list of all the medicines, herbs,  non-prescription drugs, or dietary supplements you use. Also tell them if you smoke, drink alcohol, or use illegal drugs. Some items may interact with your medicine. What should I watch for while using this medicine? Your condition will be monitored carefully while you are receiving this medicine. You will need important blood work done while you are taking this medicine. This drug may make you feel generally unwell. This is not uncommon, as chemotherapy can affect healthy cells as well as cancer cells. Report any side effects. Continue your course of treatment even though you feel ill unless your doctor tells you to stop. In some cases, you may be given additional medicines to help with side effects. Follow all directions for their use. Call your doctor or health care professional for advice if you get a fever, chills or sore throat, or other symptoms of a cold or flu. Do not treat yourself. This drug decreases your body's ability  to fight infections. Try to avoid being around people who are sick. This medicine may increase your risk to bruise or bleed. Call your doctor or health care professional if you notice any unusual bleeding. Be careful brushing and flossing your teeth or using a toothpick because you may get an infection or bleed more easily. If you have any dental work done, tell your dentist you are receiving this medicine. Avoid taking products that contain aspirin, acetaminophen, ibuprofen, naproxen, or ketoprofen unless instructed by your doctor. These medicines may hide a fever. Do not become pregnant while taking this medicine. Women should inform their doctor if they wish to become pregnant or think they might be pregnant. There is a potential for serious side effects to an unborn child. Talk to your health care professional or pharmacist for more information. Do not breast-feed an infant while taking this medicine. What side effects may I notice from receiving this medicine? Side effects  that you should report to your doctor or health care professional as soon as possible: -allergic reactions like skin rash, itching or hives, swelling of the face, lips, or tongue -signs of infection - fever or chills, cough, sore throat, pain or difficulty passing urine -signs of decreased platelets or bleeding - bruising, pinpoint red spots on the skin, black, tarry stools, nosebleeds -signs of decreased red blood cells - unusually weak or tired, fainting spells, lightheadedness -breathing problems -changes in hearing -changes in vision -chest pain -high blood pressure -low blood counts - This drug may decrease the number of white blood cells, red blood cells and platelets. You may be at increased risk for infections and bleeding. -nausea and vomiting -pain, swelling, redness or irritation at the injection site -pain, tingling, numbness in the hands or feet -problems with balance, talking, walking -trouble passing urine or change in the amount of urine Side effects that usually do not require medical attention (report to your doctor or health care professional if they continue or are bothersome): -hair loss -loss of appetite -metallic taste in the mouth or changes in taste This list may not describe all possible side effects. Call your doctor for medical advice about side effects. You may report side effects to FDA at 1-800-FDA-1088. Where should I keep my medicine? This drug is given in a hospital or clinic and will not be stored at home. NOTE: This sheet is a summary. It may not cover all possible information. If you have questions about this medicine, talk to your doctor, pharmacist, or health care provider.  2018 Elsevier/Gold Standard (2007-09-16 14:38:05)

## 2017-05-26 ENCOUNTER — Encounter: Payer: Self-pay | Admitting: Hematology

## 2017-05-26 NOTE — Addendum Note (Signed)
Addended by: [name] on: 05/26/2017 12:55 PM   Modules accepted: Orders

## 2017-05-27 ENCOUNTER — Encounter (HOSPITAL_COMMUNITY): Payer: Medicare Other

## 2017-05-27 ENCOUNTER — Encounter: Payer: Self-pay | Admitting: Hematology

## 2017-05-27 ENCOUNTER — Ambulatory Visit (HOSPITAL_COMMUNITY): Payer: Medicare Other

## 2017-05-27 NOTE — Progress Notes (Signed)
Submitted auth request for Ondansetron today.  Status is pending.  °

## 2017-05-29 ENCOUNTER — Encounter: Payer: Self-pay | Admitting: Hematology

## 2017-05-29 NOTE — Progress Notes (Signed)
Pt's Ondansetron was approved from 05/27/17 through 05/26/2117.

## 2017-05-31 ENCOUNTER — Other Ambulatory Visit (HOSPITAL_BASED_OUTPATIENT_CLINIC_OR_DEPARTMENT_OTHER): Payer: Medicare Other

## 2017-05-31 ENCOUNTER — Encounter: Payer: Self-pay | Admitting: Nurse Practitioner

## 2017-05-31 ENCOUNTER — Ambulatory Visit: Payer: Medicare Other

## 2017-05-31 ENCOUNTER — Ambulatory Visit (HOSPITAL_BASED_OUTPATIENT_CLINIC_OR_DEPARTMENT_OTHER): Payer: Medicare Other | Admitting: Nurse Practitioner

## 2017-05-31 VITALS — BP 102/60 | HR 102 | Temp 98.3°F | Resp 18 | Wt 122.2 lb

## 2017-05-31 DIAGNOSIS — R5383 Other fatigue: Secondary | ICD-10-CM | POA: Diagnosis not present

## 2017-05-31 DIAGNOSIS — R197 Diarrhea, unspecified: Secondary | ICD-10-CM | POA: Diagnosis not present

## 2017-05-31 DIAGNOSIS — C50112 Malignant neoplasm of central portion of left female breast: Secondary | ICD-10-CM

## 2017-05-31 DIAGNOSIS — C50811 Malignant neoplasm of overlapping sites of right female breast: Secondary | ICD-10-CM | POA: Diagnosis not present

## 2017-05-31 DIAGNOSIS — R739 Hyperglycemia, unspecified: Secondary | ICD-10-CM | POA: Diagnosis not present

## 2017-05-31 DIAGNOSIS — E860 Dehydration: Secondary | ICD-10-CM

## 2017-05-31 DIAGNOSIS — Z170 Estrogen receptor positive status [ER+]: Secondary | ICD-10-CM | POA: Diagnosis not present

## 2017-05-31 DIAGNOSIS — M898X9 Other specified disorders of bone, unspecified site: Secondary | ICD-10-CM

## 2017-05-31 DIAGNOSIS — R682 Dry mouth, unspecified: Secondary | ICD-10-CM

## 2017-05-31 DIAGNOSIS — R634 Abnormal weight loss: Secondary | ICD-10-CM

## 2017-05-31 DIAGNOSIS — T380X5A Adverse effect of glucocorticoids and synthetic analogues, initial encounter: Secondary | ICD-10-CM | POA: Diagnosis not present

## 2017-05-31 LAB — COMPREHENSIVE METABOLIC PANEL
ALT: 54 U/L (ref 0–55)
AST: 22 U/L (ref 5–34)
Albumin: 3.3 g/dL — ABNORMAL LOW (ref 3.5–5.0)
Alkaline Phosphatase: 137 U/L (ref 40–150)
Anion Gap: 11 mEq/L (ref 3–11)
BUN: 21.6 mg/dL (ref 7.0–26.0)
CO2: 22 mEq/L (ref 22–29)
Calcium: 8.9 mg/dL (ref 8.4–10.4)
Chloride: 103 mEq/L (ref 98–109)
Creatinine: 0.9 mg/dL (ref 0.6–1.1)
EGFR: >60 mL/min/{1.73_m2} (ref >60)
Glucose: 119 mg/dl (ref 70–140)
Potassium: 4.6 mEq/L (ref 3.5–5.1)
Sodium: 135 mEq/L — ABNORMAL LOW (ref 136–145)
Total Bilirubin: 0.34 mg/dL (ref 0.20–1.20)
Total Protein: 5.9 g/dL — ABNORMAL LOW (ref 6.4–8.3)

## 2017-05-31 LAB — CBC WITH DIFFERENTIAL/PLATELET
BASO%: 0.4 % (ref 0.0–2.0)
Basophils Absolute: 0.1 10*3/uL (ref 0.0–0.1)
EOS%: 0.3 % (ref 0.0–7.0)
Eosinophils Absolute: 0.1 10*3/uL (ref 0.0–0.5)
HCT: 44.7 % (ref 34.8–46.6)
HGB: 14.6 g/dL (ref 11.6–15.9)
LYMPH%: 6 % — ABNORMAL LOW (ref 14.0–49.7)
MCH: 30.2 pg (ref 25.1–34.0)
MCHC: 32.7 g/dL (ref 31.5–36.0)
MCV: 92.3 fL (ref 79.5–101.0)
MONO#: 0.1 10*3/uL (ref 0.1–0.9)
MONO%: 0.2 % (ref 0.0–14.0)
NEUT#: 27.4 10*3/uL — ABNORMAL HIGH (ref 1.5–6.5)
NEUT%: 93.1 % — ABNORMAL HIGH (ref 38.4–76.8)
Platelets: 149 10*3/uL (ref 145–400)
RBC: 4.85 10*6/uL (ref 3.70–5.45)
RDW: 13.3 % (ref 11.2–14.5)
WBC: 29.4 10*3/uL — ABNORMAL HIGH (ref 3.9–10.3)
lymph#: 1.8 10*3/uL (ref 0.9–3.3)

## 2017-05-31 MED ORDER — SODIUM CHLORIDE 0.9% FLUSH
10.0000 mL | Freq: Once | INTRAVENOUS | Status: AC
  Administered 2017-05-31: 10 mL
  Filled 2017-05-31: qty 10

## 2017-05-31 MED ORDER — HEPARIN SOD (PORK) LOCK FLUSH 100 UNIT/ML IV SOLN
500.0000 [IU] | Freq: Once | INTRAVENOUS | Status: AC
  Administered 2017-05-31: 500 [IU]
  Filled 2017-05-31: qty 5

## 2017-05-31 NOTE — Patient Instructions (Signed)
Decadron: take 1 tablet twice a day on day before chemo, then 1 tablet twice daily on day after for 3 days

## 2017-05-31 NOTE — Progress Notes (Signed)
[facility]  [phone] [phone]  Clinic Follow up Note   Patient Care Team: [name], MD as PCP - General (Family Medicine) [name], MD as Consulting Physician (Hematology) [name], NP as Nurse Practitioner (Nurse Practitioner) [name], MD as Consulting Physician (General Surgery) 05/31/2017  CHIEF COMPLAINT: F/U bilateral breast cancer  SUMMARY OF ONCOLOGIC HISTORY:   Cancer of overlapping sites of right female breast ([place])   03/08/2017 Mammogram    IMPRESSION: 1. Patient has a is palpable mass in the lateral right breast, in the location dense fibroglandular breast tissue. Although no discrete mass is seen sonographically or mammographically, the Clinical change remains concerning. Biopsy is recommended.  RECOMMENDATION: 1. Ultrasound-guided core needle biopsy of palpable abnormality in the lateral right breast.      04/24/2017 Breast US    IMPRESSION: Ultrasound guided biopsy of the right breast. No apparent complications.      04/24/2017 Initial Biopsy    Breast, right, needle core biopsy, UOQ, centered at 9:30 o'clock - INVASIVE MAMMARY CARCINOMA. - SEE COMMENT. ER 100% positive PR 100% positive Ki67 10% HER2 positive      05/02/2017 Breast MRI    IMPRESSION: 1. 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 2. 4 mm enhancing mass with associated architectural distortion in the central left breast. This is suspicious for additional focus of carcinoma.      05/07/2017 Initial Diagnosis    Cancer of overlapping sites of right female breast ([place])      05/15/2017 Echocardiogram    ECHO 05/15/17  Impressions: - LVEF 60-65%, normal wall thickness, normal wall motion and GLS   strain, grade 1 DD, normal LV filling pressure, mild MR, normal   LA size, normal IVC.         05/22/2017 Imaging    CT CAP W Contrast  05/22/17 IMPRESSION: 1. No specific CT findings of nodal or other metastatic disease in the chest, abdomen, and pelvis. 2. Bosniak category 54F cyst in the right mid kidney, with mild enhancement along a septation but without nodularity. Unless follow up abdominal scans related to the patient's breast cancer adequately characterize this lesion, renal protocol MRI or CT scan would be recommended in 6 months time. 3. Other imaging findings of potential clinical significance: The Aortic Atherosclerosis (ICD10-I70.0). Ectatic ascending thoracic aorta without aneurysm. Small type 1 hiatal hernia. Several small hypodense liver lesions are likely benign although technically too small to characterize. Nonobstructive 1.2 cm right renal calculus. Anterior uterine fundal myometrial mass favoring fibroid. Pelvic floor laxity with small cystocele. Notable spondylosis at L3-4 and L5-S1.       05/22/2017 Imaging    Bone Scan Whole Body 05/22/17 IMPRESSION: 1. No findings of osseous metastatic disease. 2. Lower lumbar activity corresponds to areas of significant benign spondylosis on the CT scan.       Chemotherapy    TCHP every 3 weeks for 6 cycles starting 05/24/17 followed by a year of maintenence Herceptin w/ or w/o Perjeta         Cancer of central portion of left breast ([place])   05/02/2017 Breast MRI    IMPRESSION: 1. 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 2. 4 mm enhancing mass with associated architectural distortion in the central left breast. This is suspicious for additional focus of carcinoma.  05/03/2017 Mammogram    IMPRESSION: 1. Suspicious area of architectural distortion in the left breast which corresponds to a small enhancing mass on MRI.  RECOMMENDATION: Stereotactic core needle biopsy of the area of architectural distortion in the left breast.       05/03/2017 Breast US     Stereotactic core needle biopsy of the area of architectural distortion in the left breast.      05/03/2017 Initial Biopsy    Breast, left, needle core biopsy, central, slightly toward the lower, outer quadrant - INVASIVE DUCTAL CARCINOMA, MSBR GRADE 1/2. - SEE MICROSCOPIC DESCRIPTION. ER 50% positive PR 90% positive Ki67: 1%      05/07/2017 Initial Diagnosis    Cancer of central portion of left breast ([place])     CURRENT THERAPY: TCHP every 3 weeks for 6 cycles starting 05/24/17 followed by a year of maintenance Herceptin and Perjeta  INTERVAL HISTORY: [name] returns as scheduled for f/u 1st cycle TCHP with onpro, treated on 05/24/17. She felt well on days 1 and 2 but developed moderate fatigue with decreased appetite and altered taste for the next 4 days. Today she feels mild improvement. She has dry mouth without sores or pain. Intermittent diarrhea over the last 1 day; no n/v; imodium helps. Mild bone discomfort with neulasta, not requiring medication other than recommended claritin x5 days. She saw Dr. Georgette Dover this morning in f/u.   REVIEW OF SYSTEMS:   Constitutional: Denies fevers, chills (+) 4 labs abnormal weight loss (+) decreased appetite (+) altered taste (+) mild fatigue  Eyes: Denies blurriness of vision Ears, nose, mouth, throat, and face: Denies oral pain or sore throat (+) dry mouth  Respiratory: Denies cough, dyspnea or wheezes Cardiovascular: Denies palpitation, chest discomfort or lower extremity swelling Gastrointestinal:  Denies nausea, vomiting, constipation, heartburn or change in bowel habits (+) intermittent diarrhea x1 day, imodium x1 with improvement  Skin: Denies abnormal skin rashes Lymphatics: Denies new lymphadenopathy or easy bruising Neurological:Denies numbness, tingling, new weaknesses, lightheadedness or dizziness Behavioral/Psych: Mood is stable, no new changes  All other systems were reviewed with the patient and are negative.  MEDICAL HISTORY:   Past Medical History:  Diagnosis Date  . Cancer ([place]) 04/2017   Bil Breast Ca  . Hyperlipidemia   . Hypertension     SURGICAL HISTORY: Past Surgical History:  Procedure Laterality Date  . DILATION AND CURETTAGE OF UTERUS    . PORTACATH PLACEMENT Right 05/14/2017   Procedure: ULTRASOUND GUIDED PORT PLACEMENT;  Surgeon: [name], MD;  Location: [facility];  Service: General;  Laterality: Right;    I have reviewed the social history and family history with the patient and they are unchanged from previous note.  ALLERGIES:  has No Known Allergies.  MEDICATIONS:  Current Outpatient Medications  Medication Sig Dispense Refill  . aspirin EC 81 MG tablet Take by mouth.    . dexamethasone (DECADRON) 4 MG tablet Take 2 tablets (8 mg total) 2 (two) times daily by mouth. Start the day before Taxotere. Then again the day after chemo for 3 days. 30 tablet 1  . fluconazole (DIFLUCAN) 100 MG tablet Take 1 tablet (100 mg total) by mouth daily. 3 tablet 1  . irbesartan (AVAPRO) 150 MG tablet     . lidocaine-prilocaine (EMLA) cream Apply to affected area once 30 g 3  . Multiple Vitamin (MULTIVITAMIN) tablet Take 1 tablet daily by mouth.    . ondansetron (ZOFRAN ODT) 8 MG disintegrating tablet Take 1 tablet (  8 mg total) by mouth every 8 (eight) hours as needed for nausea or vomiting. Start on Day 3 post chemo 20 tablet 0  . prochlorperazine (COMPAZINE) 10 MG tablet Take 1 tablet (10 mg total) every 6 (six) hours as needed by mouth (Nausea or vomiting). 30 tablet 1  . simvastatin (ZOCOR) 40 MG tablet Take 40 mg at bedtime by mouth.     No current facility-administered medications for this visit.     PHYSICAL EXAMINATION: ECOG PERFORMANCE STATUS: 2 - Symptomatic, <50% confined to bed  Vitals:   05/31/17 1233 05/31/17 1406  BP:  102/60  Pulse:  (!) 102  Resp: 18   Temp: 98.3 F (36.8 C) 98.3 F (36.8 C)  SpO2:  97%   Filed Weights   05/31/17 1233  Weight: 122 lb 3.2  oz (55.4 kg)    GENERAL:alert, no distress and comfortable SKIN: skin color, texture, turgor are normal, no rashes or significant lesions EYES: normal, Conjunctiva are pink and non-injected, sclera clear OROPHARYNX:no exudate lips, buccal mucosa, and tongue normal (+) mild erythema to posterior pharynx without ulceration NECK: supple, thyroid normal size, non-tender, without nodularity LYMPH:  no palpable cervical, supraclavicular, or axillary lymphadenopathy  LUNGS: clear to auscultation bilaterally with normal breathing effort HEART: regular rate & rhythm and no murmurs and no lower extremity edema ABDOMEN:abdomen soft, non-tender and normal bowel sounds Musculoskeletal:no cyanosis of digits and no clubbing  NEURO: alert & oriented x 3 with fluent speech, no focal motor/sensory deficits Breast exam deferred today  PAC without erythema   LABORATORY DATA:  I have reviewed the data as listed CBC Latest Ref Rng & Units 05/31/2017 05/23/2017 05/13/2017  WBC 3.9 - 10.3 10e3/uL 29.4(H) 5.1 5.4  Hemoglobin 11.6 - 15.9 g/dL 14.6 14.1 14.9  Hematocrit 34.8 - 46.6 % 44.7 43.0 45.2  Platelets 145 - 400 10e3/uL 149 236 241     CMP Latest Ref Rng & Units 05/31/2017 05/23/2017 05/13/2017  Glucose 70 - 140 mg/dl 119 229(H) 95  BUN 7.0 - 26.0 mg/dL 21.6 17.1 17.7  Creatinine 0.6 - 1.1 mg/dL 0.9 1.1 0.8  Sodium 136 - 145 mEq/L 135(L) 140 141  Potassium 3.5 - 5.1 mEq/L 4.6 4.1 4.3  CO2 22 - 29 mEq/L 22 20(L) 26  Calcium 8.4 - 10.4 mg/dL 8.9 9.0 9.6  Total Protein 6.4 - 8.3 g/dL 5.9(L) 7.2 7.5  Total Bilirubin 0.20 - 1.20 mg/dL 0.34 0.28 0.47  Alkaline Phos 40 - 150 U/L 137 109 98  AST 5 - 34 U/L _0 ALT 0 - 55 U/L 54 33 32   CA 27.29 05/13/17: 59.5  Diagnosis11/9/18  Breast, left, needle core biopsy, central, slightly toward the lower, outer quadrant - INVASIVE DUCTAL CARCINOMA, MSBR GRADE 1/2. - SEE MICROSCOPIC DESCRIPTION. ER 50% positive PR 90% positive Ki67 1% Microscopic  Comment Breast prognostic profile will be performed. Called to [facility] on 05/06/2017.   Diagnosis10/31/18  Breast, right, needle core biopsy, UOQ, centered at 9:30 o'clock - INVASIVE MAMMARY CARCINOMA. - SEE COMMENT. Microscopic Comment The carcinoma appears grade II. An E-cadherin and a breast prognostic profile will be performed and the results reported separately. The results were called to [facility] on 04/25/2017. (JBK:ecj 04/25/2017) Results: IMMUNOHISTOCHEMICAL AND MORPHOMETRIC ANALYSIS PERFORMED MANUALLY Estrogen Receptor: 100%, POSITIVE, STRONG STAINING INTENSITY Progesterone Receptor: 100%, POSITIVE, STRONG STAINING INTENSITY Proliferation Marker Ki67: 10% HER2:**POSITIVE**   PROCEDURES  ECHO 05/15/17  Impressions: - LVEF 60-65%, normal wall  thickness, normal wall motion and GLS   strain, grade 1 DD, normal LV filling pressure, mild MR, normal   LA size, normal IVC.    RADIOGRAPHIC STUDIES: I have personally reviewed the radiological images as listed and agreed with the findings in the report.  Diagnostic Mammogram Bilateral 05/03/17  IMPRESSION: 1. 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 2. 4 mm enhancing mass with associated architectural distortion in the central left breast. This is suspicious for additional focus of Carcinoma.  Diagnostic Mammogram, right 03/08/17 IMPRESSION: 1. Patient has a is palpable mass in the 9:30 position in the lateral right breast 2 cm from the nipple, in the location dense fibroglandular breast tissue. Although no discrete mass is seen sonographically or mammographically, the Clinical change remains concerning. Biopsy is recommended.  BONE SCANIMPRESSION: 05/22/17  1. No findings of osseous metastatic disease. 2. Lower lumbar activity corresponds to areas of significant benign spondylosis on  the CT scan.   CT CAP IMPRESSION: 05/22/17  1. No specific CT findings of nodal or other metastatic disease in the chest, abdomen, and pelvis. 2. Bosniak category 70F cyst in the right mid kidney, with mild enhancement along a septation but without nodularity. Unless follow up abdominal scans related to the patient's breast cancer adequately characterize this lesion, renal protocol MRI or CT scan would be recommended in 6 months time. 3. Other imaging findings of potential clinical significance: The Aortic Atherosclerosis (ICD10-I70.0). Ectatic ascending thoracic aorta without aneurysm. Small type 1 hiatal hernia. Several small hypodense liver lesions are likely benign although technically too small to characterize. Nonobstructive 1.2 cm right renal calculus. Anterior uterine fundal myometrial mass favoring fibroid. Pelvic floor laxity with small cystocele. Notable spondylosis at L3-4 and L5-S1.   ASSESSMENT & PLAN:  71 y.o. caucasian postmenopausal woman   1.  Cancer of overlapping sites of right breast, invasive lobular carcinoma, Stage IB cT3, cN0, cM0, G2; ER positive, PR positive, HER2 positive  2.  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 Genetics 3. Bosniak category 70F cyst in the right mid kidney, 1.2cm Non-obstructive kidney stone 4. Borderline BM, yeast infection symptoms  Ms. Pottenger appears stable today. She tolerated cycle 1 neoadjuvant TCHP moderately well overall with moderate fatigue, mild weight loss, and intermittent diarrhea x1 day. We reviewed dosing instructions for imodium, she will increase as needed. For decreased appetite and altered taste, I recommend rinsing mouth out prior to meals. She plans to use biotin at home for dry mouth; I also encouraged increasing po liquids for mild dehydration. Will refer to dietician for further input. We reviewed decadron  dosing, she developed hyperglycemia with BG 224 with 1st cycle. She will take 4 mg BID day prior and day after for 3 days instead of 8 mg BID. BG is normal today, 119; Cmet otherwise unremarkable. CBC with leukocytosis secondary to neulasta. She will return in 2 weeks for cycle 2 neoadjuvant TCHP. Will continue to monitor breast exam to assess response to therapy.   She had f/u with Dr. Georgette Dover today, patient reports he will f/u after completion of chemotherapy to plan surgery. She reports he mentioned getting interim imaging during chemo. If palpable breast mass does not respond clinically or gets bigger while on chemo, will obtain imaging.   PLAN: Referral to dietician for altered taste, weight loss Increase po  liquids for mild dehydration Increase imodium as needed Return in 2 weeks for f/u and cycle 2 TCHP   Orders Placed This Encounter  Procedures  . Ambulatory referral to [facility]    Referral Priority:   Routine    Referral Type:   Consultation    Referral Reason:   Specialty Services Required    Number of Visits Requested:   1   All questions were answered. The patient knows to call the clinic with any problems, questions or concerns. No barriers to learning was detected.     [name], NP 05/31/17

## 2017-06-01 LAB — HEMOGLOBIN A1C
Est. average glucose Bld gHb Est-mCnc: 137 mg/dL
Hemoglobin A1c: 6.4 % — ABNORMAL HIGH (ref 4.8–5.6)

## 2017-06-01 LAB — CANCER ANTIGEN 27.29: CA 27.29: 50 U/mL — ABNORMAL HIGH (ref 0.0–38.6)

## 2017-06-04 ENCOUNTER — Telehealth: Payer: Self-pay

## 2017-06-04 NOTE — Telephone Encounter (Signed)
Spoke with patient and she is aware of her NUT appt

## 2017-06-12 NOTE — Progress Notes (Signed)
[place]  [phone] [phone]  Clinic Follow Up Note   Patient Care Team: [name], MD as PCP - General (Family Medicine) [name], MD as Consulting Physician (Hematology) [name], NP as Nurse Practitioner (Nurse Practitioner) [name], MD as Consulting Physician (General Surgery)   Date of Service:  06/13/2017  CHIEF COMPLAINTS:  F/u bilateral breast cancer      Cancer of overlapping sites of right female breast ([place])   03/08/2017 Mammogram    IMPRESSION: 1. Patient has a is palpable mass in the lateral right breast, in the location dense fibroglandular breast tissue. Although no discrete mass is seen sonographically or mammographically, the Clinical change remains concerning. Biopsy is recommended.  RECOMMENDATION: 1. Ultrasound-guided core needle biopsy of palpable abnormality in the lateral right breast.      04/24/2017 Breast US    IMPRESSION: Ultrasound guided biopsy of the right breast. No apparent complications.      04/24/2017 Initial Biopsy    Breast, right, needle core biopsy, UOQ, centered at 9:30 o'clock - INVASIVE MAMMARY CARCINOMA. - SEE COMMENT. ER 100% positive PR 100% positive Ki67 10% HER2 positive      05/02/2017 Breast MRI    IMPRESSION: 1. 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 2. 4 mm enhancing mass with associated architectural distortion in the central left breast. This is suspicious for additional focus of carcinoma.      05/07/2017 Initial Diagnosis    Cancer of overlapping sites of right female breast ([place])      05/15/2017 Echocardiogram    ECHO 05/15/17  Impressions: - LVEF 60-65%, normal wall thickness, normal wall motion and GLS   strain, grade 1 DD, normal LV filling pressure, mild MR, normal   LA size, normal IVC.         05/22/2017 Imaging    CT CAP W Contrast  05/22/17 IMPRESSION: 1. No specific CT findings of nodal or other metastatic disease in the chest, abdomen, and pelvis. 2. Bosniak category 27F cyst in the right mid kidney, with mild enhancement along a septation but without nodularity. Unless follow up abdominal scans related to the patient's breast cancer adequately characterize this lesion, renal protocol MRI or CT scan would be recommended in 6 months time. 3. Other imaging findings of potential clinical significance: The Aortic Atherosclerosis (ICD10-I70.0). Ectatic ascending thoracic aorta without aneurysm. Small type 1 hiatal hernia. Several small hypodense liver lesions are likely benign although technically too small to characterize. Nonobstructive 1.2 cm right renal calculus. Anterior uterine fundal myometrial mass favoring fibroid. Pelvic floor laxity with small cystocele. Notable spondylosis at L3-4 and L5-S1.       05/22/2017 Imaging    Bone Scan Whole Body 05/22/17 IMPRESSION: 1. No findings of osseous metastatic disease. 2. Lower lumbar activity corresponds to areas of significant benign spondylosis on the CT scan.      05/24/2017 -  Chemotherapy    TCHP every 3 weeks for 6 cycles starting 05/24/17 followed by a year of maintenence Herceptin w/ or w/o Perjeta         Cancer of central portion of left breast ([place])   05/02/2017 Breast MRI    IMPRESSION: 1. 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 2. 4 mm enhancing mass with associated architectural distortion in the central left breast. This is suspicious for  additional focus of carcinoma.      05/03/2017 Mammogram    IMPRESSION: 1. Suspicious area of architectural distortion in the left breast which corresponds to a small enhancing mass on MRI.  RECOMMENDATION: Stereotactic core needle biopsy of the area of architectural distortion in the left breast.       05/03/2017  Breast US    Stereotactic core needle biopsy of the area of architectural distortion in the left breast.      05/03/2017 Initial Biopsy    Breast, left, needle core biopsy, central, slightly toward the lower, outer quadrant - INVASIVE DUCTAL CARCINOMA, MSBR GRADE 1/2. - SEE MICROSCOPIC DESCRIPTION. ER 50% positive PR 90% positive Ki67: 1%      05/07/2017 Initial Diagnosis    Cancer of central portion of left breast ([place])      HISTORY OF PRESENTING ILLNESS:  Madison Stokes 71 y.o. female is here because of newly diagnosed bilateral breast cancer. She was referred by Dr. Georgette Dover. She presents to clinic accompanied by her daughter in law and husband. She noticed a right breast mass in early September 2018, she underwent diagnostic right mammogram with ultrasound on 03/08/17; no discrete mass was seen sonographically or mammographically. She underwent right breast biopsy with flip placement on 04/24/17, found to be invasive mammary carcinoma, favoring lobular type. She was referred to Dr. Georgette Dover who then ordered bilateral breast MRI. Imaging identified non mass enhancement spanning 5.9 x 3.2 x 5.6 cm in the right breast as well as a small enhancing 4 mm mass in the central left breast with a small hypoechoic focus. She then underwent diagnostic mammogram with [place] and biopsy in the left breast. Pathology revealed invasive ductal carcinoma. Her last mammogram was 04/2016, no abnormal mammogram or previous breast biopsy. She has not noticed associated breast changes, nipple discharge or inversion, or skin changes. No weight loss, decreased appetite, or fatigue.   She has no significant past medical history, she is on blood pressure and cholesterol medication. She is retired Network engineer, lives with her husband; has 2 adult sons. No tobacco or alcohol history. She reports having a cold lately she caught from her grandson but otherwise feels well. Occasionally has urgent BM after meals, denies abd fullness, pain,  early satiety, nausea or vomiting.   GYN HISTORY  Menarchal: 10 LMP: age 70, menopause  Contraceptive:  HRT: None GP: 2 pregnancies, 2 births    CURRENT THERAPY: TCHP every 3 weeks for 6 cycles starting 05/24/17 followed by a year of maintenance Herceptin and Perjeta   INTERVAL HISTORY:  [place] Gayden is here for a follow up. before first cycle 2 TCHP tomorrow. She presents to the clinic today accompanied by her husband.  She feels she recovered well from her first cycle. She is back to eating well but still has mild fatigue. She denies nausea, but was able to managed diarrhea with 2 imodium. She notes she is starting to lose her hair. She reports she already has a wig. Her weight is overall stable. She notes her feet have a numb-like feeling when she sleeps. She notes she thinks her mass may be shrinking.      SOCIAL HISTORY: Social History   Socioeconomic History  . Marital status: Married    Spouse name: Not on file  . Number of children: Not on file  . Years of education: Not on file  . Highest education level: Not on file  Social Needs  . Financial resource strain: Not on file  . Food  insecurity - worry: Not on file  . Food insecurity - inability: Not on file  . Transportation needs - medical: Not on file  . Transportation needs - non-medical: Not on file  Occupational History  . Not on file  Tobacco Use  . Smoking status: Never Smoker  . Smokeless tobacco: Never Used  Substance and Sexual Activity  . Alcohol use: Yes    Comment: 1 glass of wine per month  . Drug use: No  . Sexual activity: Not on file  Other Topics Concern  . Not on file  Social History Narrative  . Not on file    FAMILY HISTORY: Family History  Problem Relation Age of Onset  . Cancer Mother 9       uterine  . Cancer Brother        prostate    ALLERGIES:  has No Known Allergies.  MEDICATIONS:  Current Outpatient Medications  Medication Sig Dispense Refill  . aspirin EC 81 MG  tablet Take by mouth.    . dexamethasone (DECADRON) 4 MG tablet Take 2 tablets (8 mg total) 2 (two) times daily by mouth. Start the day before Taxotere. Then again the day after chemo for 3 days. 30 tablet 1  . irbesartan (AVAPRO) 150 MG tablet     . lidocaine-prilocaine (EMLA) cream Apply to affected area once 30 g 3  . loratadine (CLARITIN) 10 MG tablet Take 10 mg by mouth daily as needed for allergies.    . Multiple Vitamin (MULTIVITAMIN) tablet Take 1 tablet daily by mouth.    . ondansetron (ZOFRAN ODT) 8 MG disintegrating tablet Take 1 tablet (8 mg total) by mouth every 8 (eight) hours as needed for nausea or vomiting. Start on Day 3 post chemo 20 tablet 0  . prochlorperazine (COMPAZINE) 10 MG tablet Take 1 tablet (10 mg total) every 6 (six) hours as needed by mouth (Nausea or vomiting). 30 tablet 1  . simvastatin (ZOCOR) 40 MG tablet Take 40 mg at bedtime by mouth.     No current facility-administered medications for this visit.     REVIEW OF SYSTEMS:   Constitutional: Denies fatigue, fevers, chills or abnormal night sweats (+) mild fatigue  Eyes: Denies blurriness of vision, double vision or watery eyes Ears, nose, mouth, throat, and face: Denies mucositis or sore throat  Respiratory: Denies dyspnea or wheezes  Cardiovascular: Denies palpitation, chest discomfort or lower extremity swelling Gastrointestinal:  Denies nausea, vomiting (+) controlled diarrhea  Vaginal (+) on going discharge  Skin: Denies abnormal skin rashes Lymphatics: Denies new lymphadenopathy or easy bruising Neurological:Denies tingling or new weaknesses (+) mild numbness on her feet at night  Behavioral/Psych: Mood is stable, no new changes  All other systems were reviewed with the patient and are negative.  PHYSICAL EXAMINATION: ECOG PERFORMANCE STATUS: 0 - Asymptomatic  Vitals:   06/13/17 1337  BP: 132/72  Pulse: (!) 105  Resp: 18  Temp: 99 F (37.2 C)  SpO2: 100%   Filed Weights   06/13/17 1337   Weight: 126 lb 3.2 oz (57.2 kg)     GENERAL:alert, no distress and comfortable SKIN: skin color, texture, turgor are normal, no rashes or significant lesions EYES: normal, conjunctiva are pink and non-injected, sclera clear OROPHARYNX:no exudate, no erythema and lips, buccal mucosa, and tongue normal  NECK: supple, thyroid normal size, non-tender, without nodularity LYMPH:  no palpable cervical, supraclavicular, axillary, or inguinal lymphadenopathy  LUNGS: clear to auscultation bilaterally with normal breathing effort HEART: regular rate &  rhythm and no murmurs and no lower extremity edema ABDOMEN:abdomen soft, non-tender and normal bowel sounds. No palpable hepatomegaly  Musculoskeletal:no cyanosis of digits and no clubbing  PSYCH: alert & oriented x 3 with fluent speech NEURO: no focal motor/sensory deficits BREASTS: inspection shows them to be symmetrical without nipple discharge or inversion. Right breast with 4x5 cm palpable slightly tender mobile mass to upper outer breast with ecchymosis. Left breast with 1 cm palpable mass to lower central breast, nontender.  Bilateral axilla were negative for palpable adenopathy.   CBC Latest Ref Rng & Units 06/13/2017 05/31/2017 05/23/2017  WBC 3.9 - 10.3 10e3/uL 5.4 29.4(H) 5.1  Hemoglobin 11.6 - 15.9 g/dL 12.5 14.6 14.1  Hematocrit 34.8 - 46.6 % 37.2 44.7 43.0  Platelets 145 - 400 10e3/uL 289 149 236   CMP Latest Ref Rng & Units 06/13/2017 05/31/2017 05/23/2017  Glucose 70 - 140 mg/dl 224(H) 119 229(H)  BUN 7.0 - 26.0 mg/dL 17.3 21.6 17.1  Creatinine 0.6 - 1.1 mg/dL 0.9 0.9 1.1  Sodium 136 - 145 mEq/L 142 135(L) 140  Potassium 3.5 - 5.1 mEq/L 4.1 4.6 4.1  CO2 22 - 29 mEq/L 23 22 20(L)  Calcium 8.4 - 10.4 mg/dL 8.9 8.9 9.0  Total Protein 6.4 - 8.3 g/dL 6.4 5.9(L) 7.2  Total Bilirubin 0.20 - 1.20 mg/dL 0.24 0.34 0.28  Alkaline Phos 40 - 150 U/L 124 137 109  AST 5 - 34 U/L _0 ALT 0 - 55 U/L 50 54 33     CA 27.29 05/13/17:  59.5 05/31/17: 50.0  Diagnosis 05/03/17  Breast, left, needle core biopsy, central, slightly toward the lower, outer quadrant - INVASIVE DUCTAL CARCINOMA, MSBR GRADE 1/2. - SEE MICROSCOPIC DESCRIPTION. ER 50% positive PR 90% positive Ki67 1% Microscopic Comment Breast prognostic profile will be performed. Called to [facility] on 05/06/2017.   Diagnosis 04/24/17  Breast, right, needle core biopsy, UOQ, centered at 9:30 o'clock - INVASIVE MAMMARY CARCINOMA. - SEE COMMENT. Microscopic Comment The carcinoma appears grade II. An E-cadherin and a breast prognostic profile will be performed and the results reported separately. The results were called to [facility] on 04/25/2017. (JBK:ecj 04/25/2017) Results: IMMUNOHISTOCHEMICAL AND MORPHOMETRIC ANALYSIS PERFORMED MANUALLY Estrogen Receptor: 100%, POSITIVE, STRONG STAINING INTENSITY Progesterone Receptor: 100%, POSITIVE, STRONG STAINING INTENSITY Proliferation Marker Ki67: 10% HER2: **POSITIVE**   PROCEDURES  ECHO 05/15/17  Impressions: - LVEF 60-65%, normal wall thickness, normal wall motion and GLS   strain, grade 1 DD, normal LV filling pressure, mild MR, normal   LA size, normal IVC.    RADIOGRAPHIC STUDIES: I have personally reviewed the radiological images as listed and agreed with the findings in the report.  Diagnostic Mammogram Bilateral 05/03/17  IMPRESSION: 1. 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 2. 4 mm enhancing mass with associated architectural distortion in the central left breast. This is suspicious for additional focus of Carcinoma.   Diagnostic Mammogram, right 03/08/17 IMPRESSION: 1. Patient has a is palpable mass in the 9:30 position in the lateral right breast 2 cm from the nipple, in the location dense fibroglandular breast tissue. Although no discrete mass is seen  sonographically or mammographically, the Clinical change remains concerning. Biopsy is recommended.  Ct Chest W Contrast  Result Date: 05/22/2017 CLINICAL DATA:  Bilateral breast cancer diagnosed in November 2018, staging workup. EXAM: CT CHEST, ABDOMEN, AND PELVIS WITH CONTRAST TECHNIQUE:  Multidetector CT imaging of the chest, abdomen and pelvis was performed following the standard protocol during bolus administration of intravenous contrast. CONTRAST:  179m ISOVUE-300 IOPAMIDOL (ISOVUE-300) INJECTION 61% COMPARISON:  Chest radiograph 05/14/2017 FINDINGS: CT CHEST FINDINGS Cardiovascular: Right Port-A-Cath with tip in the lower SVC. Mild atherosclerotic calcification of the aortic arch. Ectatic ascending thoracic aorta, 3.7 cm diameter, without overt aneurysm. Mediastinum/Nodes: Small axillary lymph nodes are present bilaterally and have fatty hila. Small subpectoral lymph nodes are present on the left including a 4 mm in short axis node on image 17/2. No pathologic enlarged adenopathy is identified in the chest. Small type 1 hiatal hernia is suspected. Lungs/Pleura: Mild biapical pleuroparenchymal scarring. No findings of metastatic disease to the lungs. Musculoskeletal: Unremarkable CT ABDOMEN PELVIS FINDINGS Hepatobiliary: 5 small hypodense lesions in the liver are likely benign lesions such as cysts or hemangiomas but are technically too small to characterize. The largest measures 5 by 4 mm on image 50/2. No worrisome hepatic enhancement is identified. Gallbladder unremarkable. No biliary dilatation. Pancreas: Unremarkable Spleen: Unremarkable Adrenals/Urinary Tract: Multiple bilateral renal cysts are present. [facility]. The among the right-sided cysts there is a upper pole cyst with internal septation compatible with a Bosniak category 2 lesion measuring 5.1 by 4.4 cm, and a multi septated Bosniak category 65F cystic lesion measuring 9.4 by 5.9 cm, with  some linear enhancement along one of the central septations on images 67-68 of series 2. Right mid to lower kidney oval-shaped stone measuring 1.2 cm in long axis on image 75/2. Some of the cysts have parapelvic components which indent the collecting systems. Urinary bladder unremarkable. Stomach/Bowel: Unremarkable Vascular/Lymphatic: Aortoiliac atherosclerotic vascular disease. No pathologic adenopathy is observed. Reproductive: In the anterior uterine fundus there is a fairly homogeneously enhancing 2.7 by 2.5 by 2.3 cm mass favoring an intramural fibroid. I do not perceive any endometrial thickening. Adnexa unremarkable. Other: No supplemental non-categorized findings. Musculoskeletal: Pelvic floor laxity with small cystocele shown on image 81/5. Levoconvex lumbar scoliosis with prominent focal degenerative facet arthropathy at L5-S1 and L4-5, and grade 1 degenerative anterolisthesis at L5-S1. There is right facet arthropathy at the L3-4 level along with intervertebral spurring and endplate sclerosis at the L3-4 level. Intervertebral spurring at L3-4 contributes to mild right foraminal stenosis at this level. These degenerative findings form a reasonable basis for the appearance of high activity in these regions on today's bone scan. No CT findings of osseous metastatic disease. IMPRESSION: 1. No specific CT findings of nodal or other metastatic disease in the chest, abdomen, and pelvis. 2. Bosniak category 65F cyst in the right mid kidney, with mild enhancement along a septation but without nodularity. Unless follow up abdominal scans related to the patient's breast cancer adequately characterize this lesion, renal protocol MRI or CT scan would be recommended in 6 months time. 3. Other imaging findings of potential clinical significance: The Aortic Atherosclerosis (ICD10-I70.0). Ectatic ascending thoracic aorta without aneurysm. Small type 1 hiatal hernia. Several small hypodense liver lesions are likely benign  although technically too small to characterize. Nonobstructive 1.2 cm right renal calculus. Anterior uterine fundal myometrial mass favoring fibroid. Pelvic floor laxity with small cystocele. Notable spondylosis at L3-4 and L5-S1. Electronically Signed   By: WVan ClinesM.D.   On: 05/22/2017 13:28   Nm Bone Scan Whole Body  Result Date: 05/22/2017 CLINICAL DATA:  Staging workup of breast cancer EXAM: NUCLEAR MEDICINE WHOLE BODY BONE SCAN TECHNIQUE: Whole body anterior and posterior images were  obtained approximately 3 hours after intravenous injection of radiopharmaceutical. RADIOPHARMACEUTICALS:  21.4 mCi Technetium-50mMDP IV COMPARISON:  CT scans of 05/22/2017 FINDINGS: High activity along the left facet joint at L5-S1 and right intervertebral level at L3-4 corroborates with prominent degenerative findings on the CT scan and accordingly is judged benign. No other foci of accentuated bone scan activity observed. No findings of osseous metastatic disease. IMPRESSION: 1. No findings of osseous metastatic disease. 2. Lower lumbar activity corresponds to areas of significant benign spondylosis on the CT scan. Electronically Signed   By: WVan ClinesM.D.   On: 05/22/2017 13:30   Ct Abdomen Pelvis W Contrast  Result Date: 05/22/2017 CLINICAL DATA:  Bilateral breast cancer diagnosed in November 2018, staging workup. EXAM: CT CHEST, ABDOMEN, AND PELVIS WITH CONTRAST TECHNIQUE: Multidetector CT imaging of the chest, abdomen and pelvis was performed following the standard protocol during bolus administration of intravenous contrast. CONTRAST:  1068mISOVUE-300 IOPAMIDOL (ISOVUE-300) INJECTION 61% COMPARISON:  Chest radiograph 05/14/2017 FINDINGS: CT CHEST FINDINGS Cardiovascular: Right Port-A-Cath with tip in the lower SVC. Mild atherosclerotic calcification of the aortic arch. Ectatic ascending thoracic aorta, 3.7 cm diameter, without overt aneurysm. Mediastinum/Nodes: Small axillary lymph nodes are  present bilaterally and have fatty hila. Small subpectoral lymph nodes are present on the left including a 4 mm in short axis node on image 17/2. No pathologic enlarged adenopathy is identified in the chest. Small type 1 hiatal hernia is suspected. Lungs/Pleura: Mild biapical pleuroparenchymal scarring. No findings of metastatic disease to the lungs. Musculoskeletal: Unremarkable CT ABDOMEN PELVIS FINDINGS Hepatobiliary: 5 small hypodense lesions in the liver are likely benign lesions such as cysts or hemangiomas but are technically too small to characterize. The largest measures 5 by 4 mm on image 50/2. No worrisome hepatic enhancement is identified. Gallbladder unremarkable. No biliary dilatation. Pancreas: Unremarkable Spleen: Unremarkable Adrenals/Urinary Tract: Multiple bilateral renal cysts are present. The [facility]. The among the right-sided cysts there is a upper pole cyst with internal septation compatible with a Bosniak category 2 lesion measuring 5.1 by 4.4 cm, and a multi septated Bosniak category 44F cystic lesion measuring 9.4 by 5.9 cm, with some linear enhancement along one of the central septations on images 67-68 of series 2. Right mid to lower kidney oval-shaped stone measuring 1.2 cm in long axis on image 75/2. Some of the cysts have parapelvic components which indent the collecting systems. Urinary bladder unremarkable. Stomach/Bowel: Unremarkable Vascular/Lymphatic: Aortoiliac atherosclerotic vascular disease. No pathologic adenopathy is observed. Reproductive: In the anterior uterine fundus there is a fairly homogeneously enhancing 2.7 by 2.5 by 2.3 cm mass favoring an intramural fibroid. I do not perceive any endometrial thickening. Adnexa unremarkable. Other: No supplemental non-categorized findings. Musculoskeletal: Pelvic floor laxity with small cystocele shown on image 81/5. Levoconvex lumbar scoliosis with prominent focal degenerative facet  arthropathy at L5-S1 and L4-5, and grade 1 degenerative anterolisthesis at L5-S1. There is right facet arthropathy at the L3-4 level along with intervertebral spurring and endplate sclerosis at the L3-4 level. Intervertebral spurring at L3-4 contributes to mild right foraminal stenosis at this level. These degenerative findings form a reasonable basis for the appearance of high activity in these regions on today's bone scan. No CT findings of osseous metastatic disease. IMPRESSION: 1. No specific CT findings of nodal or other metastatic disease in the chest, abdomen, and pelvis. 2. Bosniak category 44F cyst in the right mid kidney, with mild enhancement along a septation but without nodularity. Unless follow  up abdominal scans related to the patient's breast cancer adequately characterize this lesion, renal protocol MRI or CT scan would be recommended in 6 months time. 3. Other imaging findings of potential clinical significance: The Aortic Atherosclerosis (ICD10-I70.0). Ectatic ascending thoracic aorta without aneurysm. Small type 1 hiatal hernia. Several small hypodense liver lesions are likely benign although technically too small to characterize. Nonobstructive 1.2 cm right renal calculus. Anterior uterine fundal myometrial mass favoring fibroid. Pelvic floor laxity with small cystocele. Notable spondylosis at L3-4 and L5-S1. Electronically Signed   By: Van Clines M.D.   On: 05/22/2017 13:28    ASSESSMENT & PLAN:  71 y.o. caucasian postmenopausal woman   1.  Cancer of overlapping sites of right breast, invasive lobular carcinoma, Stage IB cT3, cN0, cM0, G2; ER positive, PR positive, HER2 positive  2.  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 In her right breast, she has large area of disease that is HER2 positive. -Due to the high risk of  recurrence from right breast cancer, she would benefit from neoadjuvant or adjuvant chemotherapy.  I recommend neoadjuvant chemotherapy to shrink her right breast cancer, to see if lumpectomy would be more feasible after neoadjuvant chemotherapy TCHP every 3 weeks for 6 cycles, followed by maintenance Herceptin with or without Perjeta to complete 1 year therapy. The goal of therapy is curative.  Discussed the benefits and potential side effects from chemo, she is agreeable. I will discuss with her surgeon Dr. Georgette Dover.   -We also reviewed that lobular carcinoma is less sensitive to chemotherapy, will monitor her disease closely during the chemo, and likely get a interim scan to confirm her response to chemo. -Baseline CA 27.29 was 59.5, elevated   -I discussed her CT CAP and Bone scan from 05/22/17 shows no evidence of metastasis. There was incidental findings of 46F cyst in her right kidney and a non-obstructing kidney stone, will monitor.  -Baseline ECHO from 04/2017 is normal.  -She previously spoke with research RN about clinical trail, patient declined at this time.  -She is fine to continue multivitamins and probiotics. I suggest she try compazine as needed or before each meal before trying to get Zofran approved.  -She started [facility] on 05/24/17 with Onpro, tolerated moderately well with controlled diarrhea and mild fatigue and hair loss.  She has recovered well now. -Labs reviewed, her CBC has recovered well, hg at 12.5.  Adequate for treatment, will proceed second cycle chemotherapy tomorrow. -I explained as she continues treatment she may experience more fatigue, diarrhea and her blood counts may drop. I encouraged her to remain active, eat well with healthy meals, watch her blood sugar while on dexa and use Claritin for possible bone pain. She can use OTC B-Complex for the new numbness in her feet.  -F/u on 1/10   2. Genetics  -I reached out to genetics counselor to see if patient qualifies for  genetics referral, she has synchronous bilateral ductal and lobular carcinoma, her brother has prostate cancer and her mother had uterine cancer in her 86's. I will follow up.   3. 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   4. Borderline DM, yeast infection symptoms -I will reduce her dexa dose to 66m BID instead of 899mBID around her chemo  -I  encouraged her to increase water intake and watch her diet -will copy note to Dr. Kenton Kingfisher to provide her with  more information of diabetic education.  -I will called in fluconazole (05/23/17), to take one dose to treat.  -If she has persistent hypoglycemia I will recommend her to start, diabetic medication, such as metformin. -she developed hyperglycemia with BG 224 with 1st cycle. She will take 4 mg BID day prior and day after for 3 days instead of 8 mg BID.  -Dietician consult was set up to help pt  -HB1c is 6.4 as of 05/31/17  -I reviewed what signs of hyperglycemia to look for and encouraged her to monitor her BG at home.     PLAN: -Labs reviewed and adequate to proceed with [facility] tomorrow at same dose  -Lab, flush, f/u on 1/10 and TCHP on 1/11     No orders of the defined types were placed in this encounter.   All questions were answered. The patient knows to call the clinic with any problems, questions or concerns.   [name], MD 06/13/2017   This document serves as a record of services personally performed by [name], MD. It was created on her behalf by Joslyn Devon, a trained medical scribe. The creation of this record is based on the scribe's personal observations and the provider's statements to them.    I have reviewed the above documentation for accuracy and completeness, and I agree with the above.

## 2017-06-13 ENCOUNTER — Encounter: Payer: Self-pay | Admitting: Hematology

## 2017-06-13 ENCOUNTER — Ambulatory Visit (HOSPITAL_BASED_OUTPATIENT_CLINIC_OR_DEPARTMENT_OTHER): Payer: Medicare Other | Admitting: Hematology

## 2017-06-13 ENCOUNTER — Other Ambulatory Visit (HOSPITAL_BASED_OUTPATIENT_CLINIC_OR_DEPARTMENT_OTHER): Payer: Medicare Other

## 2017-06-13 ENCOUNTER — Ambulatory Visit: Payer: Medicare Other

## 2017-06-13 VITALS — BP 132/72 | HR 105 | Temp 99.0°F | Resp 18 | Ht 62.0 in | Wt 126.2 lb

## 2017-06-13 DIAGNOSIS — C50811 Malignant neoplasm of overlapping sites of right female breast: Secondary | ICD-10-CM

## 2017-06-13 DIAGNOSIS — Z170 Estrogen receptor positive status [ER+]: Secondary | ICD-10-CM | POA: Diagnosis not present

## 2017-06-13 DIAGNOSIS — R5383 Other fatigue: Secondary | ICD-10-CM

## 2017-06-13 DIAGNOSIS — R197 Diarrhea, unspecified: Secondary | ICD-10-CM | POA: Diagnosis not present

## 2017-06-13 DIAGNOSIS — C50112 Malignant neoplasm of central portion of left female breast: Secondary | ICD-10-CM | POA: Diagnosis not present

## 2017-06-13 DIAGNOSIS — L659 Nonscarring hair loss, unspecified: Secondary | ICD-10-CM | POA: Diagnosis not present

## 2017-06-13 DIAGNOSIS — R739 Hyperglycemia, unspecified: Secondary | ICD-10-CM | POA: Diagnosis not present

## 2017-06-13 LAB — COMPREHENSIVE METABOLIC PANEL
ALT: 50 U/L (ref 0–55)
AST: 31 U/L (ref 5–34)
Albumin: 3.5 g/dL (ref 3.5–5.0)
Alkaline Phosphatase: 124 U/L (ref 40–150)
Anion Gap: 12 mEq/L — ABNORMAL HIGH (ref 3–11)
BUN: 17.3 mg/dL (ref 7.0–26.0)
CO2: 23 mEq/L (ref 22–29)
Calcium: 8.9 mg/dL (ref 8.4–10.4)
Chloride: 107 mEq/L (ref 98–109)
Creatinine: 0.9 mg/dL (ref 0.6–1.1)
EGFR: >60 mL/min/{1.73_m2} (ref >60)
Glucose: 224 mg/dl — ABNORMAL HIGH (ref 70–140)
Potassium: 4.1 mEq/L (ref 3.5–5.1)
Sodium: 142 mEq/L (ref 136–145)
Total Bilirubin: 0.24 mg/dL (ref 0.20–1.20)
Total Protein: 6.4 g/dL (ref 6.4–8.3)

## 2017-06-13 LAB — CBC WITH DIFFERENTIAL/PLATELET
BASO%: 0.3 % (ref 0.0–2.0)
Basophils Absolute: 0 10*3/uL (ref 0.0–0.1)
EOS%: 0.2 % (ref 0.0–7.0)
Eosinophils Absolute: 0 10*3/uL (ref 0.0–0.5)
HCT: 37.2 % (ref 34.8–46.6)
HGB: 12.5 g/dL (ref 11.6–15.9)
LYMPH%: 7 % — ABNORMAL LOW (ref 14.0–49.7)
MCH: 31.4 pg (ref 25.1–34.0)
MCHC: 33.5 g/dL (ref 31.5–36.0)
MCV: 93.5 fL (ref 79.5–101.0)
MONO#: 0 10*3/uL — ABNORMAL LOW (ref 0.1–0.9)
MONO%: 0.7 % (ref 0.0–14.0)
NEUT#: 4.9 10*3/uL (ref 1.5–6.5)
NEUT%: 91.8 % — ABNORMAL HIGH (ref 38.4–76.8)
Platelets: 289 10*3/uL (ref 145–400)
RBC: 3.98 10*6/uL (ref 3.70–5.45)
RDW: 13.8 % (ref 11.2–14.5)
WBC: 5.4 10*3/uL (ref 3.9–10.3)
lymph#: 0.4 10*3/uL — ABNORMAL LOW (ref 0.9–3.3)

## 2017-06-13 MED ORDER — HEPARIN SOD (PORK) LOCK FLUSH 100 UNIT/ML IV SOLN
500.0000 [IU] | Freq: Once | INTRAVENOUS | Status: AC
  Administered 2017-06-13: 500 [IU]
  Filled 2017-06-13: qty 5

## 2017-06-13 MED ORDER — SODIUM CHLORIDE 0.9% FLUSH
10.0000 mL | Freq: Once | INTRAVENOUS | Status: AC
Start: 2017-06-13 — End: 2017-06-13
  Administered 2017-06-13: 10 mL
  Filled 2017-06-13: qty 10

## 2017-06-14 ENCOUNTER — Telehealth: Payer: Self-pay | Admitting: Hematology

## 2017-06-14 ENCOUNTER — Ambulatory Visit: Payer: Medicare Other | Admitting: Nutrition

## 2017-06-14 ENCOUNTER — Ambulatory Visit (HOSPITAL_BASED_OUTPATIENT_CLINIC_OR_DEPARTMENT_OTHER): Payer: Medicare Other

## 2017-06-14 VITALS — BP 121/63 | HR 65 | Temp 98.2°F | Resp 17

## 2017-06-14 DIAGNOSIS — Z5111 Encounter for antineoplastic chemotherapy: Secondary | ICD-10-CM | POA: Diagnosis not present

## 2017-06-14 DIAGNOSIS — Z170 Estrogen receptor positive status [ER+]: Secondary | ICD-10-CM

## 2017-06-14 DIAGNOSIS — C50811 Malignant neoplasm of overlapping sites of right female breast: Secondary | ICD-10-CM | POA: Diagnosis not present

## 2017-06-14 DIAGNOSIS — C50112 Malignant neoplasm of central portion of left female breast: Secondary | ICD-10-CM

## 2017-06-14 DIAGNOSIS — Z5112 Encounter for antineoplastic immunotherapy: Secondary | ICD-10-CM | POA: Diagnosis not present

## 2017-06-14 MED ORDER — ACETAMINOPHEN 325 MG PO TABS
ORAL_TABLET | ORAL | Status: AC
  Filled 2017-06-14: qty 2

## 2017-06-14 MED ORDER — PEGFILGRASTIM 6 MG/0.6ML ~~LOC~~ PSKT
PREFILLED_SYRINGE | SUBCUTANEOUS | Status: AC
  Filled 2017-06-14: qty 0.6

## 2017-06-14 MED ORDER — SODIUM CHLORIDE 0.9 % IV SOLN
420.0000 mg | Freq: Once | INTRAVENOUS | Status: AC
  Administered 2017-06-14: 420 mg via INTRAVENOUS
  Filled 2017-06-14: qty 14

## 2017-06-14 MED ORDER — ACETAMINOPHEN 325 MG PO TABS
650.0000 mg | ORAL_TABLET | Freq: Once | ORAL | Status: AC
  Administered 2017-06-14: 650 mg via ORAL

## 2017-06-14 MED ORDER — HEPARIN SOD (PORK) LOCK FLUSH 100 UNIT/ML IV SOLN
500.0000 [IU] | Freq: Once | INTRAVENOUS | Status: AC | PRN
  Administered 2017-06-14: 500 [IU]
  Filled 2017-06-14: qty 5

## 2017-06-14 MED ORDER — PEGFILGRASTIM 6 MG/0.6ML ~~LOC~~ PSKT
6.0000 mg | PREFILLED_SYRINGE | Freq: Once | SUBCUTANEOUS | Status: AC
  Administered 2017-06-14: 6 mg via SUBCUTANEOUS

## 2017-06-14 MED ORDER — TRASTUZUMAB CHEMO 150 MG IV SOLR
6.0000 mg/kg | Freq: Once | INTRAVENOUS | Status: AC
  Administered 2017-06-14: 336 mg via INTRAVENOUS
  Filled 2017-06-14: qty 16

## 2017-06-14 MED ORDER — SODIUM CHLORIDE 0.9 % IV SOLN
Freq: Once | INTRAVENOUS | Status: AC
  Administered 2017-06-14: 09:00:00 via INTRAVENOUS
  Filled 2017-06-14: qty 5

## 2017-06-14 MED ORDER — SODIUM CHLORIDE 0.9 % IV SOLN
Freq: Once | INTRAVENOUS | Status: AC
  Administered 2017-06-14: 08:00:00 via INTRAVENOUS

## 2017-06-14 MED ORDER — DIPHENHYDRAMINE HCL 25 MG PO CAPS
50.0000 mg | ORAL_CAPSULE | Freq: Once | ORAL | Status: AC
  Administered 2017-06-14: 50 mg via ORAL

## 2017-06-14 MED ORDER — SODIUM CHLORIDE 0.9 % IV SOLN
435.0000 mg | Freq: Once | INTRAVENOUS | Status: AC
  Administered 2017-06-14: 440 mg via INTRAVENOUS
  Filled 2017-06-14: qty 44

## 2017-06-14 MED ORDER — PALONOSETRON HCL INJECTION 0.25 MG/5ML
INTRAVENOUS | Status: AC
  Filled 2017-06-14: qty 5

## 2017-06-14 MED ORDER — PALONOSETRON HCL INJECTION 0.25 MG/5ML
0.2500 mg | Freq: Once | INTRAVENOUS | Status: AC
  Administered 2017-06-14: 0.25 mg via INTRAVENOUS

## 2017-06-14 MED ORDER — DOCETAXEL CHEMO INJECTION 160 MG/16ML
75.0000 mg/m2 | Freq: Once | INTRAVENOUS | Status: AC
  Administered 2017-06-14: 120 mg via INTRAVENOUS
  Filled 2017-06-14: qty 12

## 2017-06-14 MED ORDER — DIPHENHYDRAMINE HCL 25 MG PO CAPS
ORAL_CAPSULE | ORAL | Status: AC
  Filled 2017-06-14: qty 2

## 2017-06-14 MED ORDER — SODIUM CHLORIDE 0.9% FLUSH
10.0000 mL | INTRAVENOUS | Status: DC | PRN
  Administered 2017-06-14: 10 mL
  Filled 2017-06-14: qty 10

## 2017-06-14 NOTE — Telephone Encounter (Signed)
No 12/20 los °

## 2017-06-14 NOTE — Patient Instructions (Signed)
[facility] Discharge Instructions for Patients Receiving Chemotherapy  Today you received the following chemotherapy agents Herceptin,Perjeta, Taxotere and Carboplatin  To help prevent nausea and vomiting after your treatment, we encourage you to take your nausea medication  As directed If you develop nausea and vomiting that is not controlled by your nausea medication, call the clinic.   BELOW ARE SYMPTOMS THAT SHOULD BE REPORTED IMMEDIATELY:  *FEVER GREATER THAN 100.5 F  *CHILLS WITH OR WITHOUT FEVER  NAUSEA AND VOMITING THAT IS NOT CONTROLLED WITH YOUR NAUSEA MEDICATION  *UNUSUAL SHORTNESS OF BREATH  *UNUSUAL BRUISING OR BLEEDING  TENDERNESS IN MOUTH AND THROAT WITH OR WITHOUT PRESENCE OF ULCERS  *URINARY PROBLEMS  *BOWEL PROBLEMS  UNUSUAL RASH Items with * indicate a potential emergency and should be followed up as soon as possible.  Feel free to call the clinic should you have any questions or concerns. The clinic phone number is [phone].  Please show the [place] at check-in to the Emergency Department and triage nurse.

## 2017-06-14 NOTE — Progress Notes (Signed)
71 year old female diagnosed with bilateral breast cancer.  She will receive TCHP every 3 weeks for 6 cycles.  She is a patient of Dr. Burr Medico.  Past medical history includes hypertension and hyperlipidemia.  Medications include Decadron, MVI, Zofran, Compazine, and Zocor.  Labs include glucose 224 on December 20; HG A1c 6.4, and albumin 3.3 on December 7.  Height: 62 inches. Weight: 126.2 pounds. BMI: 23.08.  Patient reports she is within her normal weight range. She experienced metallic taste after chemotherapy. She is trying to watch her carbohydrate intake to better control her blood sugars.  Nutrition diagnosis:  Food and nutrition related knowledge deficit related to bilateral breast cancer and associated treatments as evidenced by no prior need for nutrition related information.  Intervention: Patient educated on strategies to improve altered taste.  Provided fact sheet. Encouraged patient to consume smaller more frequent meals and snacks with a balance of protein carbohydrate and fat. Educated patient on strategies for managing blood glucose levels and provided fact sheet. Questions were answered.  Teach back method used.  Contact information given.  Monitoring, evaluation, goals: Patient will tolerate adequate calories and protein for weight maintenance and adequate glycemic control.  Next visit: January 11, during infusion.  **Disclaimer: This note was dictated with voice recognition software. Similar sounding words can inadvertently be transcribed and this note may contain transcription errors which may not have been corrected upon publication of note.**

## 2017-06-26 ENCOUNTER — Telehealth: Payer: Self-pay | Admitting: *Deleted

## 2017-06-26 NOTE — Telephone Encounter (Signed)
I called patient back.  She called the on-call physician yesterday for worsening bilateral finger swelling, redness and pain, and was instructed to start the dexamethasone for 5 days.  She did take dexamethasone 4 mg twice daily yesterday and this morning, her symptoms has improved.  She denies any other fever, or other new symptoms.  She will continue dexamethasone 4 million daily for next 3 days and stop.  He knows to call us if she has worsening symptoms or new symptoms.  She is scheduled for visits and treatment on July 04, 2017.  [name]  06/26/2017

## 2017-06-26 NOTE — Telephone Encounter (Signed)
Left message at home # for pt to return call to discuss problem with hands.

## 2017-07-02 NOTE — Progress Notes (Signed)
Madison Stokes  [phone]) [phone] Fax:([phone]) [phone]  Clinic Follow Up Note   Patient Care Team: [name], MD as PCP - General (Family Medicine) [name], MD as Consulting Physician (Hematology) [name], NP as Nurse Practitioner (Nurse Practitioner) [name], MD as Consulting Physician (General Surgery)   Date of Service:  07/04/2017  CHIEF COMPLAINTS:  F/u bilateral breast cancer    Oncology History   Cancer Staging Cancer of central portion of left breast [facility]) Staging form: Breast, AJCC 8th Edition - Clinical stage from 05/03/2017: Stage IA (cT1a, cN0, cM0, G1, ER: Positive, PR: Positive, HER2: Negative) - Signed by [name], MD on 05/07/2017  Cancer of overlapping sites of right female breast [facility]) Staging form: Breast, AJCC 8th Edition - Clinical stage from 04/24/2017: Stage IB (cT3, cN0, cM0, G2, ER: Positive, PR: Positive, HER2: Positive) - Signed by [name], MD on 05/07/2017       Cancer of overlapping sites of right female breast ([place])   03/08/2017 Mammogram    IMPRESSION: 1. Patient has a is palpable mass in the lateral right breast, in the location dense fibroglandular breast tissue. Although no discrete mass is seen sonographically or mammographically, the Clinical change remains concerning. Biopsy is recommended.  RECOMMENDATION: 1. Ultrasound-guided core needle biopsy of palpable abnormality in the lateral right breast.      04/24/2017 Breast US    IMPRESSION: Ultrasound guided biopsy of the right breast. No apparent complications.      04/24/2017 Initial Biopsy    Breast, right, needle core biopsy, UOQ, centered at 9:30 o'clock - INVASIVE MAMMARY CARCINOMA. - SEE COMMENT. ER 100% positive PR 100% positive Ki67 10% HER2 positive      05/02/2017 Breast MRI    IMPRESSION: 1. 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 2. 4 mm  enhancing mass with associated architectural distortion in the central left breast. This is suspicious for additional focus of carcinoma.      05/07/2017 Initial Diagnosis    Cancer of overlapping sites of right female breast ([place])      05/15/2017 Echocardiogram    ECHO 05/15/17  Impressions: - LVEF 60-65%, normal wall thickness, normal wall motion and GLS   strain, grade 1 DD, normal LV filling pressure, mild MR, normal   LA size, normal IVC.         05/22/2017 Imaging    CT CAP W Contrast 05/22/17 IMPRESSION: 1. No specific CT findings of nodal or other metastatic disease in the chest, abdomen, and pelvis. 2. Bosniak category 35F cyst in the right mid kidney, with mild enhancement along a septation but without nodularity. Unless follow up abdominal scans related to the patient's breast cancer adequately characterize this lesion, renal protocol MRI or CT scan would be recommended in 6 months time. 3. Other imaging findings of potential clinical significance: The Aortic Atherosclerosis (ICD10-I70.0). Ectatic ascending thoracic aorta without aneurysm. Small type 1 hiatal hernia. Several small hypodense liver lesions are likely benign although technically too small to characterize. Nonobstructive 1.2 cm right renal calculus. Anterior uterine fundal myometrial mass favoring fibroid. Pelvic floor laxity with small cystocele. Notable spondylosis at L3-4 and L5-S1.       05/22/2017 Imaging    Bone Scan Whole Body 05/22/17 IMPRESSION: 1. No findings of osseous metastatic disease. 2. Lower lumbar activity corresponds to areas of significant benign spondylosis on the CT scan.      05/24/2017 -  Chemotherapy    TCHP every 3 weeks for 6 cycles starting 05/24/17 followed by a year of maintenence Herceptin w/ or w/o Perjeta         Cancer of central portion of left breast ([place])   05/02/2017 Breast MRI    IMPRESSION: 1. Known right breast lobular carcinoma spanning 5.9 x  3.2 x 5.6 cm, involving the upper outer and lower outer quadrants. 2. 4 mm enhancing mass with associated architectural distortion in the central left breast. This is suspicious for additional focus of carcinoma.      05/03/2017 Mammogram    IMPRESSION: 1. Suspicious area of architectural distortion in the left breast which corresponds to a small enhancing mass on MRI.  RECOMMENDATION: Stereotactic core needle biopsy of the area of architectural distortion in the left breast.       05/03/2017 Breast US    Stereotactic core needle biopsy of the area of architectural distortion in the left breast.      05/03/2017 Initial Biopsy    Breast, left, needle core biopsy, central, slightly toward the lower, outer quadrant - INVASIVE DUCTAL CARCINOMA, MSBR GRADE 1/2. - SEE MICROSCOPIC DESCRIPTION. ER 50% positive PR 90% positive Ki67: 1%      05/07/2017 Initial Diagnosis    Cancer of central portion of left breast ([place])      HISTORY OF PRESENTING ILLNESS:  Madison Stokes 72 y.o. female is here because of newly diagnosed bilateral breast cancer. She was referred by Dr. Georgette Dover. She presents to clinic accompanied by her daughter in law and husband. She noticed a right breast mass in early September 2018, she underwent diagnostic right mammogram with ultrasound on 03/08/17; no discrete mass was seen sonographically or mammographically. She underwent right breast biopsy with flip placement on 04/24/17, found to be invasive mammary carcinoma, favoring lobular type. She was referred to Dr. Georgette Dover who then ordered bilateral breast MRI. Imaging identified non mass enhancement spanning 5.9 x 3.2 x 5.6 cm in the right breast as well as a small enhancing 4 mm mass in the central left breast with a small hypoechoic focus. She then underwent diagnostic mammogram with [place] and biopsy in the left breast. Pathology revealed invasive ductal carcinoma. Her last mammogram was 04/2016, no abnormal mammogram or  previous breast biopsy. She has not noticed associated breast changes, nipple discharge or inversion, or skin changes. No weight loss, decreased appetite, or fatigue.   She has no significant past medical history, she is on blood pressure and cholesterol medication. She is retired Network engineer, lives with her husband; has 2 adult sons. No tobacco or alcohol history. She reports having a cold lately she caught from her grandson but otherwise feels well. Occasionally has urgent BM after meals, denies abd fullness, pain, early satiety, nausea or vomiting.   GYN HISTORY  Menarchal: 89 LMP: age 85, menopause  Contraceptive:  HRT: None GP: 2 pregnancies, 2 births    CURRENT THERAPY: TCHP every 3 weeks for 6 cycles starting 05/24/17 followed by a year of maintenance Herceptin and Perjeta   INTERVAL HISTORY:  Madison Stokes is here for a follow up and cycle 3 of TCHP. Pt presents to the office today accompanied by her family. She reports that she is doing well overall after her 2 cycle of chemotherapy. She reports a decreased appetite after her chemo but it subsides after 4 days and her appetite returns. She notes that her hands are swollen and mildly red. The swelling and redness did improve from the steroids.  She describes having some discomfort in her hands with mild pain and numbness/tingling. Pt only took Tylenol at first until she noticed improvement with steroids. Per pt, she has been taking Decadron twice daily 1 day before chemo and starting back with four times daily for 3 days following chemo. She reports some mild tingling in her toes as well with no swelling or redness. She also notes a dark spot in her mouth above her front teeth. She denies diarrhea, vomiting, nausea, fever, cough, or any other complaints at this time.      SOCIAL HISTORY: Social History   Socioeconomic History  . Marital status: Married    Spouse name: Not on file  . Number of children: Not on file  . Years of  education: Not on file  . Highest education level: Not on file  Social Needs  . Financial resource strain: Not on file  . Food insecurity - worry: Not on file  . Food insecurity - inability: Not on file  . Transportation needs - medical: Not on file  . Transportation needs - non-medical: Not on file  Occupational History  . Not on file  Tobacco Use  . Smoking status: Never Smoker  . Smokeless tobacco: Never Used  Substance and Sexual Activity  . Alcohol use: Yes    Comment: 1 glass of wine per month  . Drug use: No  . Sexual activity: Not on file  Other Topics Concern  . Not on file  Social History Narrative  . Not on file    FAMILY HISTORY: Family History  Problem Relation Age of Onset  . Cancer Mother 44       uterine  . Cancer Brother        prostate    ALLERGIES:  has No Known Allergies.  MEDICATIONS:  Current Outpatient Medications  Medication Sig Dispense Refill  . aspirin EC 81 MG tablet Take by mouth.    . dexamethasone (DECADRON) 4 MG tablet Take 2 tablets (8 mg total) 2 (two) times daily by mouth. Start the day before Taxotere. Then again the day after chemo for 3 days. 30 tablet 1  . irbesartan (AVAPRO) 150 MG tablet     . lidocaine-prilocaine (EMLA) cream Apply to affected area once 30 g 3  . loperamide (IMODIUM) 2 MG capsule Take by mouth as needed for diarrhea or loose stools.    Marland Kitchen loratadine (CLARITIN) 10 MG tablet Take 10 mg by mouth daily as needed for allergies.    . Multiple Vitamin (MULTIVITAMIN) tablet Take 1 tablet daily by mouth.    . simvastatin (ZOCOR) 40 MG tablet Take 40 mg at bedtime by mouth.    . metFORMIN (GLUCOPHAGE) 500 MG tablet Take 1 tablet (500 mg total) by mouth 2 (two) times daily with a meal. 60 tablet 0  . ondansetron (ZOFRAN ODT) 8 MG disintegrating tablet Take 1 tablet (8 mg total) by mouth every 8 (eight) hours as needed for nausea or vomiting. Start on Day 3 post chemo 20 tablet 0  . prochlorperazine (COMPAZINE) 10 MG tablet  Take 1 tablet (10 mg total) every 6 (six) hours as needed by mouth (Nausea or vomiting). 30 tablet 1   No current facility-administered medications for this visit.     REVIEW OF SYSTEMS:   Constitutional: Denies fatigue, fevers, chills or abnormal night sweats (+) mild fatigue  Eyes: Denies blurriness of vision, double vision or watery eyes Ears, nose, mouth, throat, and face: Denies mucositis or sore throat  Respiratory: Denies dyspnea or wheezes  Cardiovascular: Denies palpitation, chest discomfort or lower extremity swelling Gastrointestinal:  Denies nausea, vomiting (+) controlled diarrhea  Vaginal (+) on going discharge  Skin: (+) redness and swelling to bilateral hands Lymphatics: Denies new lymphadenopathy or easy bruising Neurological:Denies tingling or new weaknesses (+) mild numbness on her feet at night  Behavioral/Psych: Mood is stable, no new changes  All other systems were reviewed with the patient and are negative.  PHYSICAL EXAMINATION: ECOG PERFORMANCE STATUS: 0 - Asymptomatic  Vitals:   07/04/17 0955  BP: (!) 150/82  Pulse: 88  Resp: 20  Temp: 97.6 F (36.4 C)  SpO2: 99%   Filed Weights   07/04/17 0955  Weight: 125 lb (56.7 kg)     GENERAL:alert, no distress and comfortable SKIN: skin color, texture, turgor are normal, no rashes or significant lesions EYES: normal, conjunctiva are pink and non-injected, sclera clear OROPHARYNX:no exudate, no erythema and lips, buccal mucosa, and tongue normal  NECK: supple, thyroid normal size, non-tender, without nodularity LYMPH:  no palpable cervical, supraclavicular, axillary, or inguinal lymphadenopathy  LUNGS: clear to auscultation bilaterally with normal breathing effort HEART: regular rate & rhythm and no murmurs and no lower extremity edema ABDOMEN:abdomen soft, non-tender and normal bowel sounds. No palpable hepatomegaly  Musculoskeletal:no cyanosis of digits and no clubbing  PSYCH: alert & oriented x 3 with  fluent speech NEURO: no focal motor/sensory deficits BREASTS: inspection shows them to be symmetrical without nipple discharge or inversion. Right breast with 4x5 cm palpable slightly tender mobile mass to upper outer breast with ecchymosis. Left breast with no palpable mass.  Bilateral axilla were negative for palpable adenopathy.    CBC Latest Ref Rng & Units 07/04/2017 06/13/2017 05/31/2017  WBC 3.9 - 10.3 K/uL 7.1 5.4 29.4(H)  Hemoglobin 11.6 - 15.9 g/dL 10.9(L) 12.5 14.6  Hematocrit 34.8 - 46.6 % 32.9(L) 37.2 44.7  Platelets 145 - 400 K/uL 198 289 149   CMP Latest Ref Rng & Units 07/04/2017 06/13/2017 05/31/2017  Glucose 70 - 140 mg/dL 105 224(H) 119  BUN 7 - 26 mg/dL 14 17.3 21.6  Creatinine 0.60 - 1.10 mg/dL 0.77 0.9 0.9  Sodium 136 - 145 mmol/L 142 142 135(L)  Potassium 3.3 - 4.7 mmol/L 4.5 4.1 4.6  Chloride 98 - 109 mmol/L 110(H) - -  CO2 22 - 29 mmol/L _0 Calcium 8.4 - 10.4 mg/dL 8.9 8.9 8.9  Total Protein 6.4 - 8.3 g/dL 6.2(L) 6.4 5.9(L)  Total Bilirubin 0.2 - 1.2 mg/dL 0.2 0.24 0.34  Alkaline Phos 40 - 150 U/L 115 124 137  AST 5 - 34 U/L _1 ALT 0 - 55 U/L 32 50 54     CA 27.29 05/13/17: 59.5 05/31/17: 50.0  Diagnosis 05/03/17  Breast, left, needle core biopsy, central, slightly toward the lower, outer quadrant - INVASIVE DUCTAL CARCINOMA, MSBR GRADE 1/2. - SEE MICROSCOPIC DESCRIPTION. ER 50% positive PR 90% positive Ki67 1% Microscopic Comment Breast prognostic profile will be performed. Called to [facility] on 05/06/2017.   Diagnosis 04/24/17  Breast, right, needle core biopsy, UOQ, centered at 9:30 o'clock - INVASIVE MAMMARY CARCINOMA. - SEE COMMENT. Microscopic Comment The carcinoma appears grade II. An E-cadherin and a breast prognostic profile will be performed and the results reported separately. The results were called to [facility] on 04/25/2017. (JBK:ecj 04/25/2017) Results: IMMUNOHISTOCHEMICAL  AND MORPHOMETRIC ANALYSIS PERFORMED MANUALLY Estrogen Receptor: 100%, POSITIVE, STRONG STAINING INTENSITY Progesterone  Receptor: 100%, POSITIVE, STRONG STAINING INTENSITY Proliferation Marker Ki67: 10% HER2: **POSITIVE**   PROCEDURES  ECHO 05/15/17  Impressions: - LVEF 60-65%, normal wall thickness, normal wall motion and GLS   strain, grade 1 DD, normal LV filling pressure, mild MR, normal   LA size, normal IVC.    RADIOGRAPHIC STUDIES: I have personally reviewed the radiological images as listed and agreed with the findings in the report.  Whole Body Bone Scan 05/22/17 IMPRESSION: 1. No findings of osseous metastatic disease. 2. Lower lumbar activity corresponds to areas of significant benign spondylosis on the CT scan  CT CAP with Contrast IMPRESSION: 1. No specific CT findings of nodal or other metastatic disease in the chest, abdomen, and pelvis. 2. Bosniak category 38F cyst in the right mid kidney, with mild enhancement along a septation but without nodularity. Unless follow up abdominal scans related to the patient's breast cancer adequately characterize this lesion, renal protocol MRI or CT scan would be recommended in 6 months time. 3. Other imaging findings of potential clinical significance: The Aortic Atherosclerosis (ICD10-I70.0). Ectatic ascending thoracic aorta without aneurysm. Small type 1 hiatal hernia. Several small hypodense liver lesions are likely benign although technically too small to characterize. Nonobstructive 1.2 cm right renal calculus. Anterior uterine fundal myometrial mass favoring fibroid. Pelvic floor laxity with small cystocele. Notable spondylosis at L3-4 and L5-S1.  Diagnostic Mammogram Bilateral 05/03/17  IMPRESSION: 1. 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 2. 4 mm enhancing mass with associated architectural distortion in the central left breast.  This is suspicious for additional focus of Carcinoma.  Diagnostic Mammogram, right 03/08/17 IMPRESSION: 1. Patient has a is palpable mass in the 9:30 position in the lateral right breast 2 cm from the nipple, in the location dense fibroglandular breast tissue. Although no discrete mass is seen sonographically or mammographically, the Clinical change remains concerning. Biopsy is recommended.  No results found.  ASSESSMENT & PLAN:  72 y.o. caucasian postmenopausal woman   1.  Cancer of overlapping sites of right breast, invasive lobular carcinoma, Stage IB cT3, cN0, cM0, G2; ER positive, PR positive, HER2 positive  2.  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 In her right breast, she has large area of disease that is HER2 positive. -Due to the high risk of recurrence from right breast cancer, she would benefit from neoadjuvant or adjuvant chemotherapy.  I recommend neoadjuvant chemotherapy to shrink her right breast cancer, to see if lumpectomy would be more feasible after neoadjuvant chemotherapy TCHP every 3 weeks for 6 cycles, followed by maintenance Herceptin with or without Perjeta to complete 1 year therapy. The goal of therapy is curative.  Discussed the benefits and potential side effects from chemo, she is agreeable. I will discuss with her surgeon Dr. Georgette Dover.   -We also reviewed that lobular carcinoma is less sensitive to chemotherapy, will monitor her disease closely during the chemo, and likely get a interim scan to confirm her response to chemo. -Baseline CA 27.29 was 59.5, elevated   -I discussed her CT CAP and Bone scan from 05/22/17 shows no evidence of metastasis. There was incidental findings of 38F cyst in her right kidney and a non-obstructing kidney stone, will monitor.  -Baseline ECHO from 04/2017 is normal.  -She  previously spoke with research RN  about clinical trail, patient declined at this time.  -She is fine to continue multivitamins and probiotics. I suggested she try compazine as needed or before each meal before trying to get Zofran approved.  -She started [facility] on 05/24/17 with Onpro, tolerated moderately well with controlled diarrhea and mild fatigue and hair loss.  She has recovered well now. -Labs reviewed, her CBC has recovered well, hg at 12.5 on 06/13/17.  Adequate for treatment, will proceed second cycle chemotherapy on 06/14/17. -I explained as she continues treatment she may experience more fatigue, diarrhea and her blood counts may drop. I encouraged her to remain active, eat well with healthy meals, watch her blood sugar while on dexa and use Claritin for possible bone pain. She can use OTC B-Complex for the new numbness in her feet.  -Discussed side effects of Docetaxel including the possible cause of redness/swelling to her hands and the dark spot in her mouth.  -I suggested taking 6m Prilosec daily for indigestion caused by the Decadron.  -Will continue with the Decadron and pt is ready for Cycle 3 of TCHP tomorrow 07/05/17   2. Genetics  -I reached out to genetics counselor to see if patient qualifies for genetics referral, she has synchronous bilateral ductal and lobular carcinoma, her brother has prostate cancer and her mother had uterine cancer in her 842's I will follow up.   3. Bosniak category 3F cyst in the right mid kidney, 1.2cm Non-obstructive kidney stone -found on 05/22/17 CT Scan  -Will continue to monitor.   4. Borderline DM,  secondary to steroids, yeast infection symptoms -I will reduce her dexa dose to 424mBID instead of 89m35mID around her chemo  -I encouraged her to increase water intake and watch her diet -will copy note to Dr. HarKenton Kingfisher provide her with  more information of diabetic education.  -I called in fluconazole (05/23/17), to take one dose to treat.    -If she has persistent hypoglycemia I will recommend her to start, diabetic medication, such as metformin. -she developed hyperglycemia with BG 224 with 1st cycle. She will take 4 mg BID day prior and day after for 3 days instead of 8 mg BID.  -Dietician consult was set up to help pt  -HB1c is 6.4 as of 05/31/17  -I previously reviewed what signs of hyperglycemia to look for and encouraged her to monitor her BG at home.  -Pt is not on medication for DM. I suggested starting Metformin to control her sugar with the plan to stop taking it once she has completed her chemotherapy. I discussed the side effects of Metformin, including diarrhea. I will fill today and I suggested she speak to her PCP about this next week and they will monitor.  5. Anemia, secondary to chemotherapy - She is asymptomatic with Hgb 10.9 today 07/04/17. No need for blood transfusion  6. Bilateral hand rash, secondary to chemotherapy -Will increase Dexamenthasone to 8 mg twice daily 1 day before chemo and 3 days after chemo. She will monitor at home and call if it drastically worsens.   PLAN: -Labs reviewed and [facility]  -Will start 500 mg Metformin bid tomorrow and follow up with PCP -Will increase Dexamenthasone to 8 mg twice daily 1 day before chemo and 3 days after chemo due to skin rashes after chemo -F/u in 3 weeks    No orders of the defined types were placed in this encounter.   All questions were  answered. The patient knows to call the clinic with any problems, questions or concerns.   [name], MD 07/04/2017   This document serves as a record of services personally performed by [name], MD. It was created on her behalf by Theresia Bough, a trained medical scribe. The creation of this record is based on the scribe's personal observations and the provider's statements to them.   I have reviewed the above documentation for accuracy and completeness, and I agree with  the above.

## 2017-07-04 ENCOUNTER — Inpatient Hospital Stay: Payer: Medicare Other | Attending: Hematology | Admitting: Hematology

## 2017-07-04 ENCOUNTER — Inpatient Hospital Stay: Payer: Medicare Other

## 2017-07-04 ENCOUNTER — Encounter: Payer: Self-pay | Admitting: Hematology

## 2017-07-04 VITALS — BP 150/82 | HR 88 | Temp 97.6°F | Resp 20 | Ht 62.0 in | Wt 125.0 lb

## 2017-07-04 DIAGNOSIS — Z7952 Long term (current) use of systemic steroids: Secondary | ICD-10-CM | POA: Insufficient documentation

## 2017-07-04 DIAGNOSIS — C50112 Malignant neoplasm of central portion of left female breast: Secondary | ICD-10-CM | POA: Diagnosis not present

## 2017-07-04 DIAGNOSIS — D500 Iron deficiency anemia secondary to blood loss (chronic): Secondary | ICD-10-CM | POA: Diagnosis not present

## 2017-07-04 DIAGNOSIS — Z170 Estrogen receptor positive status [ER+]: Secondary | ICD-10-CM

## 2017-07-04 DIAGNOSIS — Z5111 Encounter for antineoplastic chemotherapy: Secondary | ICD-10-CM | POA: Insufficient documentation

## 2017-07-04 DIAGNOSIS — D6481 Anemia due to antineoplastic chemotherapy: Secondary | ICD-10-CM | POA: Diagnosis not present

## 2017-07-04 DIAGNOSIS — Z5112 Encounter for antineoplastic immunotherapy: Secondary | ICD-10-CM | POA: Insufficient documentation

## 2017-07-04 DIAGNOSIS — K30 Functional dyspepsia: Secondary | ICD-10-CM | POA: Diagnosis not present

## 2017-07-04 DIAGNOSIS — R739 Hyperglycemia, unspecified: Secondary | ICD-10-CM | POA: Insufficient documentation

## 2017-07-04 DIAGNOSIS — N281 Cyst of kidney, acquired: Secondary | ICD-10-CM | POA: Insufficient documentation

## 2017-07-04 DIAGNOSIS — C50811 Malignant neoplasm of overlapping sites of right female breast: Secondary | ICD-10-CM

## 2017-07-04 DIAGNOSIS — K922 Gastrointestinal hemorrhage, unspecified: Secondary | ICD-10-CM | POA: Insufficient documentation

## 2017-07-04 DIAGNOSIS — R0602 Shortness of breath: Secondary | ICD-10-CM | POA: Insufficient documentation

## 2017-07-04 DIAGNOSIS — Z5189 Encounter for other specified aftercare: Secondary | ICD-10-CM | POA: Insufficient documentation

## 2017-07-04 LAB — CBC WITH DIFFERENTIAL/PLATELET
Basophils Absolute: 0 10*3/uL (ref 0.0–0.1)
Basophils Relative: 1 %
Eosinophils Absolute: 0 10*3/uL (ref 0.0–0.5)
Eosinophils Relative: 0 %
HCT: 32.9 % — ABNORMAL LOW (ref 34.8–46.6)
Hemoglobin: 10.9 g/dL — ABNORMAL LOW (ref 11.6–15.9)
Lymphocytes Relative: 7 %
Lymphs Abs: 0.5 10*3/uL — ABNORMAL LOW (ref 0.9–3.3)
MCH: 31.5 pg (ref 25.1–34.0)
MCHC: 33.1 g/dL (ref 31.5–36.0)
MCV: 95.3 fL (ref 79.5–101.0)
Monocytes Absolute: 0.1 10*3/uL (ref 0.1–0.9)
Monocytes Relative: 2 %
Neutro Abs: 6.4 10*3/uL (ref 1.5–6.5)
Neutrophils Relative %: 90 %
Platelets: 198 10*3/uL (ref 145–400)
RBC: 3.46 MIL/uL — ABNORMAL LOW (ref 3.70–5.45)
RDW: 16.9 % — ABNORMAL HIGH (ref 11.2–16.1)
WBC: 7.1 10*3/uL (ref 3.9–10.3)

## 2017-07-04 LAB — COMPREHENSIVE METABOLIC PANEL
ALT: 32 U/L (ref 0–55)
AST: 23 U/L (ref 5–34)
Albumin: 3.5 g/dL (ref 3.5–5.0)
Alkaline Phosphatase: 115 U/L (ref 40–150)
Anion gap: 9 (ref 3–11)
BUN: 14 mg/dL (ref 7–26)
CO2: 23 mmol/L (ref 22–29)
Calcium: 8.9 mg/dL (ref 8.4–10.4)
Chloride: 110 mmol/L — ABNORMAL HIGH (ref 98–109)
Creatinine, Ser: 0.77 mg/dL (ref 0.60–1.10)
GFR calc Af Amer: >60 mL/min (ref >60)
GFR calc non Af Amer: >60 mL/min (ref >60)
Glucose, Bld: 105 mg/dL (ref 70–140)
Potassium: 4.5 mmol/L (ref 3.3–4.7)
Sodium: 142 mmol/L (ref 136–145)
Total Bilirubin: 0.2 mg/dL (ref 0.2–1.2)
Total Protein: 6.2 g/dL — ABNORMAL LOW (ref 6.4–8.3)

## 2017-07-04 MED ORDER — SODIUM CHLORIDE 0.9% FLUSH
10.0000 mL | Freq: Once | INTRAVENOUS | Status: AC
  Administered 2017-07-04: 10 mL
  Filled 2017-07-04: qty 10

## 2017-07-04 MED ORDER — METFORMIN HCL 500 MG PO TABS: 500.0000 mg | ORAL_TABLET | Freq: Two times a day (BID) | ORAL | 0 refills | Status: DC

## 2017-07-04 MED ORDER — HEPARIN SOD (PORK) LOCK FLUSH 100 UNIT/ML IV SOLN
500.0000 [IU] | Freq: Once | INTRAVENOUS | Status: AC
  Administered 2017-07-04: 500 [IU]
  Filled 2017-07-04: qty 5

## 2017-07-05 ENCOUNTER — Inpatient Hospital Stay: Payer: Medicare Other

## 2017-07-05 ENCOUNTER — Inpatient Hospital Stay: Payer: Medicare Other | Admitting: Nutrition

## 2017-07-05 ENCOUNTER — Encounter: Payer: Self-pay | Admitting: *Deleted

## 2017-07-05 VITALS — BP 129/87 | HR 86 | Temp 98.4°F | Resp 16

## 2017-07-05 DIAGNOSIS — C50112 Malignant neoplasm of central portion of left female breast: Secondary | ICD-10-CM | POA: Diagnosis not present

## 2017-07-05 DIAGNOSIS — Z5111 Encounter for antineoplastic chemotherapy: Secondary | ICD-10-CM | POA: Diagnosis not present

## 2017-07-05 DIAGNOSIS — C50811 Malignant neoplasm of overlapping sites of right female breast: Secondary | ICD-10-CM | POA: Diagnosis not present

## 2017-07-05 DIAGNOSIS — Z170 Estrogen receptor positive status [ER+]: Secondary | ICD-10-CM | POA: Diagnosis not present

## 2017-07-05 DIAGNOSIS — D6481 Anemia due to antineoplastic chemotherapy: Secondary | ICD-10-CM | POA: Diagnosis not present

## 2017-07-05 DIAGNOSIS — Z5112 Encounter for antineoplastic immunotherapy: Secondary | ICD-10-CM | POA: Diagnosis not present

## 2017-07-05 LAB — CANCER ANTIGEN 27.29: CA 27.29: 44.2 U/mL — ABNORMAL HIGH (ref 0.0–38.6)

## 2017-07-05 MED ORDER — PEGFILGRASTIM 6 MG/0.6ML ~~LOC~~ PSKT
6.0000 mg | PREFILLED_SYRINGE | Freq: Once | SUBCUTANEOUS | Status: AC
  Administered 2017-07-05: 6 mg via SUBCUTANEOUS

## 2017-07-05 MED ORDER — SODIUM CHLORIDE 0.9% FLUSH
10.0000 mL | INTRAVENOUS | Status: DC | PRN
  Filled 2017-07-05: qty 10

## 2017-07-05 MED ORDER — SODIUM CHLORIDE 0.9 % IV SOLN
420.0000 mg | Freq: Once | INTRAVENOUS | Status: AC
  Administered 2017-07-05: 420 mg via INTRAVENOUS
  Filled 2017-07-05: qty 14

## 2017-07-05 MED ORDER — HEPARIN SOD (PORK) LOCK FLUSH 100 UNIT/ML IV SOLN
500.0000 [IU] | Freq: Once | INTRAVENOUS | Status: DC | PRN
  Filled 2017-07-05: qty 5

## 2017-07-05 MED ORDER — PALONOSETRON HCL INJECTION 0.25 MG/5ML
0.2500 mg | Freq: Once | INTRAVENOUS | Status: AC
  Administered 2017-07-05: 0.25 mg via INTRAVENOUS

## 2017-07-05 MED ORDER — ACETAMINOPHEN 325 MG PO TABS
650.0000 mg | ORAL_TABLET | Freq: Once | ORAL | Status: AC
  Administered 2017-07-05: 650 mg via ORAL

## 2017-07-05 MED ORDER — SODIUM CHLORIDE 0.9 % IV SOLN: Freq: Once | INTRAVENOUS | Status: DC

## 2017-07-05 MED ORDER — ACETAMINOPHEN 325 MG PO TABS
ORAL_TABLET | ORAL | Status: AC
  Filled 2017-07-05: qty 2

## 2017-07-05 MED ORDER — DIPHENHYDRAMINE HCL 25 MG PO CAPS
ORAL_CAPSULE | ORAL | Status: AC
  Filled 2017-07-05: qty 2

## 2017-07-05 MED ORDER — PALONOSETRON HCL INJECTION 0.25 MG/5ML
INTRAVENOUS | Status: AC
  Filled 2017-07-05: qty 5

## 2017-07-05 MED ORDER — SODIUM CHLORIDE 0.9 % IV SOLN
75.0000 mg/m2 | Freq: Once | INTRAVENOUS | Status: AC
  Administered 2017-07-05: 120 mg via INTRAVENOUS
  Filled 2017-07-05: qty 12

## 2017-07-05 MED ORDER — SODIUM CHLORIDE 0.9 % IV SOLN
435.0000 mg | Freq: Once | INTRAVENOUS | Status: AC
  Administered 2017-07-05: 440 mg via INTRAVENOUS
  Filled 2017-07-05: qty 44

## 2017-07-05 MED ORDER — SODIUM CHLORIDE 0.9 % IV SOLN
Freq: Once | INTRAVENOUS | Status: AC
  Administered 2017-07-05: 08:00:00 via INTRAVENOUS

## 2017-07-05 MED ORDER — TRASTUZUMAB CHEMO 150 MG IV SOLR
6.0000 mg/kg | Freq: Once | INTRAVENOUS | Status: AC
  Administered 2017-07-05: 336 mg via INTRAVENOUS
  Filled 2017-07-05: qty 16

## 2017-07-05 MED ORDER — SODIUM CHLORIDE 0.9 % IV SOLN
Freq: Once | INTRAVENOUS | Status: AC
  Administered 2017-07-05: 09:00:00 via INTRAVENOUS
  Filled 2017-07-05: qty 5

## 2017-07-05 MED ORDER — PEGFILGRASTIM 6 MG/0.6ML ~~LOC~~ PSKT
PREFILLED_SYRINGE | SUBCUTANEOUS | Status: AC
  Filled 2017-07-05: qty 0.6

## 2017-07-05 MED ORDER — DIPHENHYDRAMINE HCL 25 MG PO CAPS
50.0000 mg | ORAL_CAPSULE | Freq: Once | ORAL | Status: AC
  Administered 2017-07-05: 50 mg via ORAL

## 2017-07-05 NOTE — Progress Notes (Signed)
Nutrition follow-up completed with patient during infusion for bilateral breast cancer. Weight documented as 125 pounds January 10 down slightly from 126.2 pounds, December 20 Reviewed labs and noted glucose 105 on January 10. Patient reports information that I gave to her last visit really helped increase oral intake. She is rinsing with salt water rinses before meals and has improved taste. Patient feel she is eating better. Answered questions regarding foods and beverages that increased blood sugars.  Nutrition diagnosis: Food and nutrition related knowledge deficit improved.  Intervention: Encouraged patient to continue rinsing her mouth with salt water prior to meals. Educated patient on the importance of avoiding concentrated sweets such as juice, especially while on steroids.  Monitoring, evaluation, goals: Patient will tolerate adequate calories and protein for weight maintenance and adequate glycemic control.  Next visit: To be scheduled as needed.  **Disclaimer: This note was dictated with voice recognition software. Similar sounding words can inadvertently be transcribed and this note may contain transcription errors which may not have been corrected upon publication of note.**

## 2017-07-05 NOTE — Patient Instructions (Signed)
[facility] Discharge Instructions for Patients Receiving Chemotherapy  Today you received the following chemotherapy agents:  Herceptin, Perjecta, Taxotere, Carboplatin  To help prevent nausea and vomiting after your treatment, we encourage you to take your nausea medication as prescribed.   If you develop nausea and vomiting that is not controlled by your nausea medication, call the clinic.   BELOW ARE SYMPTOMS THAT SHOULD BE REPORTED IMMEDIATELY:  *FEVER GREATER THAN 100.5 F  *CHILLS WITH OR WITHOUT FEVER  NAUSEA AND VOMITING THAT IS NOT CONTROLLED WITH YOUR NAUSEA MEDICATION  *UNUSUAL SHORTNESS OF BREATH  *UNUSUAL BRUISING OR BLEEDING  TENDERNESS IN MOUTH AND THROAT WITH OR WITHOUT PRESENCE OF ULCERS  *URINARY PROBLEMS  *BOWEL PROBLEMS  UNUSUAL RASH Items with * indicate a potential emergency and should be followed up as soon as possible.  Feel free to call the clinic should you have any questions or concerns. The clinic phone number is [phone].  Please show the [facility] at check-in to the Emergency Department and triage nurse.

## 2017-07-11 ENCOUNTER — Other Ambulatory Visit: Payer: Self-pay | Admitting: *Deleted

## 2017-07-11 ENCOUNTER — Telehealth: Payer: Self-pay | Admitting: *Deleted

## 2017-07-11 DIAGNOSIS — C50112 Malignant neoplasm of central portion of left female breast: Secondary | ICD-10-CM

## 2017-07-11 NOTE — Telephone Encounter (Signed)
Spoke with pt, and was informed that pt has been feeling very weak, has no energy, just lying in bed most of the time.  Had chemo on Friday  07/05/17.  Stated had 1 episode of rectal bleeding on Tuesday;  Pt spoke with Dr. Burr Medico;  No more bleeding after talking with MD.  Stated that she has no energy, not able to walk short distance.  Denied pain, denied shortness of breath, just feeling very weak.  Able to drink fluids but not much, not much appetite.  Stated she had one episode of rectal bleeding today. Instructed pt if symptoms worsened during night, pt needs to go to ER for further evaluation.  Both pt and husband voiced understanding. Gave husband appts for pt to do labs and to see [facility] on Friday  07/12/17.  Schedule message sent.

## 2017-07-12 ENCOUNTER — Other Ambulatory Visit: Payer: Self-pay

## 2017-07-12 ENCOUNTER — Encounter (HOSPITAL_COMMUNITY): Payer: Self-pay

## 2017-07-12 ENCOUNTER — Telehealth: Payer: Self-pay | Admitting: Medical

## 2017-07-12 ENCOUNTER — Inpatient Hospital Stay (HOSPITAL_BASED_OUTPATIENT_CLINIC_OR_DEPARTMENT_OTHER): Payer: Medicare Other | Admitting: Medical

## 2017-07-12 ENCOUNTER — Inpatient Hospital Stay: Payer: Medicare Other

## 2017-07-12 ENCOUNTER — Inpatient Hospital Stay (HOSPITAL_COMMUNITY)
Admission: EM | Admit: 2017-07-12 | Discharge: 2017-07-14 | DRG: 378 | Disposition: A | Discharge status: Home / Self Care | Payer: Medicare Other | Attending: Internal Medicine | Admitting: Internal Medicine

## 2017-07-12 VITALS — BP 103/57 | HR 116 | Temp 97.9°F | Resp 17 | Ht 62.0 in | Wt 122.6 lb

## 2017-07-12 DIAGNOSIS — Z79899 Other long term (current) drug therapy: Secondary | ICD-10-CM | POA: Diagnosis not present

## 2017-07-12 DIAGNOSIS — R680 Hypothermia, not associated with low environmental temperature: Secondary | ICD-10-CM | POA: Diagnosis present

## 2017-07-12 DIAGNOSIS — K449 Diaphragmatic hernia without obstruction or gangrene: Secondary | ICD-10-CM | POA: Diagnosis present

## 2017-07-12 DIAGNOSIS — C50112 Malignant neoplasm of central portion of left female breast: Secondary | ICD-10-CM | POA: Diagnosis present

## 2017-07-12 DIAGNOSIS — Z7982 Long term (current) use of aspirin: Secondary | ICD-10-CM | POA: Diagnosis not present

## 2017-07-12 DIAGNOSIS — E119 Type 2 diabetes mellitus without complications: Secondary | ICD-10-CM

## 2017-07-12 DIAGNOSIS — Z95828 Presence of other vascular implants and grafts: Secondary | ICD-10-CM | POA: Diagnosis not present

## 2017-07-12 DIAGNOSIS — R195 Other fecal abnormalities: Secondary | ICD-10-CM | POA: Diagnosis present

## 2017-07-12 DIAGNOSIS — K922 Gastrointestinal hemorrhage, unspecified: Secondary | ICD-10-CM

## 2017-07-12 DIAGNOSIS — Z7984 Long term (current) use of oral hypoglycemic drugs: Secondary | ICD-10-CM | POA: Diagnosis not present

## 2017-07-12 DIAGNOSIS — D649 Anemia, unspecified: Secondary | ICD-10-CM

## 2017-07-12 DIAGNOSIS — R0602 Shortness of breath: Secondary | ICD-10-CM | POA: Diagnosis not present

## 2017-07-12 DIAGNOSIS — D62 Acute posthemorrhagic anemia: Secondary | ICD-10-CM | POA: Diagnosis not present

## 2017-07-12 DIAGNOSIS — K3189 Other diseases of stomach and duodenum: Secondary | ICD-10-CM | POA: Diagnosis not present

## 2017-07-12 DIAGNOSIS — C50811 Malignant neoplasm of overlapping sites of right female breast: Secondary | ICD-10-CM | POA: Diagnosis present

## 2017-07-12 DIAGNOSIS — Z7952 Long term (current) use of systemic steroids: Secondary | ICD-10-CM | POA: Diagnosis not present

## 2017-07-12 DIAGNOSIS — I10 Essential (primary) hypertension: Secondary | ICD-10-CM | POA: Diagnosis present

## 2017-07-12 DIAGNOSIS — K2980 Duodenitis without bleeding: Secondary | ICD-10-CM | POA: Diagnosis present

## 2017-07-12 DIAGNOSIS — D72829 Elevated white blood cell count, unspecified: Secondary | ICD-10-CM | POA: Diagnosis present

## 2017-07-12 DIAGNOSIS — K319 Disease of stomach and duodenum, unspecified: Secondary | ICD-10-CM | POA: Diagnosis present

## 2017-07-12 DIAGNOSIS — T380X5A Adverse effect of glucocorticoids and synthetic analogues, initial encounter: Secondary | ICD-10-CM | POA: Diagnosis present

## 2017-07-12 DIAGNOSIS — Z170 Estrogen receptor positive status [ER+]: Secondary | ICD-10-CM | POA: Diagnosis not present

## 2017-07-12 DIAGNOSIS — K649 Unspecified hemorrhoids: Secondary | ICD-10-CM | POA: Diagnosis present

## 2017-07-12 DIAGNOSIS — K267 Chronic duodenal ulcer without hemorrhage or perforation: Secondary | ICD-10-CM | POA: Diagnosis present

## 2017-07-12 DIAGNOSIS — E785 Hyperlipidemia, unspecified: Secondary | ICD-10-CM | POA: Diagnosis present

## 2017-07-12 DIAGNOSIS — D849 Immunodeficiency, unspecified: Secondary | ICD-10-CM | POA: Diagnosis present

## 2017-07-12 DIAGNOSIS — K269 Duodenal ulcer, unspecified as acute or chronic, without hemorrhage or perforation: Secondary | ICD-10-CM | POA: Diagnosis not present

## 2017-07-12 LAB — CMP (CANCER CENTER ONLY)
ALT: 28 U/L (ref 0–55)
AST: 20 U/L (ref 5–34)
Albumin: 3 g/dL — ABNORMAL LOW (ref 3.5–5.0)
Alkaline Phosphatase: 107 U/L (ref 40–150)
Anion gap: 8 (ref 3–11)
BUN: 14 mg/dL (ref 7–26)
CO2: 25 mmol/L (ref 22–29)
Calcium: 8.1 mg/dL — ABNORMAL LOW (ref 8.4–10.4)
Chloride: 103 mmol/L (ref 98–109)
Creatinine: 0.79 mg/dL (ref 0.60–1.10)
GFR, Est AFR Am: >60 mL/min (ref >60)
GFR, Estimated: >60 mL/min (ref >60)
Glucose, Bld: 115 mg/dL (ref 70–140)
Potassium: 3.9 mmol/L (ref 3.3–4.7)
Sodium: 136 mmol/L (ref 136–145)
Total Bilirubin: <0.2 mg/dL — ABNORMAL LOW (ref 0.2–1.2)
Total Protein: 5.2 g/dL — ABNORMAL LOW (ref 6.4–8.3)

## 2017-07-12 LAB — CBC WITH DIFFERENTIAL (CANCER CENTER ONLY)
Basophils Absolute: 0 10*3/uL (ref 0.0–0.1)
Basophils Relative: 0 %
Eosinophils Absolute: 0 10*3/uL (ref 0.0–0.5)
Eosinophils Relative: 0 %
HCT: 17.4 % — ABNORMAL LOW (ref 34.8–46.6)
Hemoglobin: 5.7 g/dL — CL (ref 11.6–15.9)
Lymphocytes Relative: 8 %
Lymphs Abs: 1.4 10*3/uL (ref 0.9–3.3)
MCH: 31.5 pg (ref 25.1–34.0)
MCHC: 32.8 g/dL (ref 31.5–36.0)
MCV: 96.1 fL (ref 79.5–101.0)
Monocytes Absolute: 1.6 10*3/uL — ABNORMAL HIGH (ref 0.1–0.9)
Monocytes Relative: 9 %
Neutro Abs: 15.1 10*3/uL — ABNORMAL HIGH (ref 1.5–6.5)
Neutrophils Relative %: 83 %
Platelet Count: 242 10*3/uL (ref 145–400)
RBC: 1.81 MIL/uL — ABNORMAL LOW (ref 3.70–5.45)
RDW: 16.9 % — ABNORMAL HIGH (ref 11.2–16.1)
WBC Count: 18.1 10*3/uL — ABNORMAL HIGH (ref 3.9–10.3)

## 2017-07-12 LAB — GLUCOSE, CAPILLARY
Glucose-Capillary: 104 mg/dL — ABNORMAL HIGH (ref 65–99)
Glucose-Capillary: 113 mg/dL — ABNORMAL HIGH (ref 65–99)

## 2017-07-12 LAB — PROTIME-INR
INR: 1.09
Prothrombin Time: 14 seconds (ref 11.4–15.2)

## 2017-07-12 LAB — PREPARE RBC (CROSSMATCH)

## 2017-07-12 LAB — ABO/RH: ABO/RH(D): O POS

## 2017-07-12 MED ORDER — SODIUM CHLORIDE 0.9 % IV SOLN
INTRAVENOUS | Status: DC
  Administered 2017-07-12: 18:00:00 via INTRAVENOUS

## 2017-07-12 MED ORDER — SENNA 8.6 MG PO TABS
1.0000 | ORAL_TABLET | Freq: Two times a day (BID) | ORAL | Status: DC
  Administered 2017-07-12: 8.6 mg via ORAL
  Filled 2017-07-12: qty 1

## 2017-07-12 MED ORDER — PROCHLORPERAZINE MALEATE 10 MG PO TABS: 10.0000 mg | ORAL_TABLET | Freq: Four times a day (QID) | ORAL | Status: DC | PRN

## 2017-07-12 MED ORDER — ONDANSETRON HCL 4 MG PO TABS: 4.0000 mg | ORAL_TABLET | Freq: Four times a day (QID) | ORAL | Status: DC | PRN

## 2017-07-12 MED ORDER — ACETAMINOPHEN 650 MG RE SUPP: 650.0000 mg | Freq: Four times a day (QID) | RECTAL | Status: DC | PRN

## 2017-07-12 MED ORDER — ADULT MULTIVITAMIN W/MINERALS CH: 1.0000 | ORAL_TABLET | Freq: Every day | ORAL | Status: DC

## 2017-07-12 MED ORDER — OXYCODONE HCL 5 MG PO TABS: 5.0000 mg | ORAL_TABLET | ORAL | Status: DC | PRN

## 2017-07-12 MED ORDER — INSULIN ASPART 100 UNIT/ML ~~LOC~~ SOLN: 0.0000 [IU] | Freq: Two times a day (BID) | SUBCUTANEOUS | Status: DC

## 2017-07-12 MED ORDER — POLYETHYLENE GLYCOL 3350 17 G PO PACK: 17.0000 g | PACK | Freq: Every day | ORAL | Status: DC | PRN

## 2017-07-12 MED ORDER — ONDANSETRON 8 MG PO TBDP: 8.0000 mg | ORAL_TABLET | Freq: Three times a day (TID) | ORAL | Status: DC | PRN

## 2017-07-12 MED ORDER — SODIUM CHLORIDE 0.9% FLUSH
3.0000 mL | Freq: Two times a day (BID) | INTRAVENOUS | Status: DC
  Administered 2017-07-12 – 2017-07-13 (×2): 3 mL via INTRAVENOUS

## 2017-07-12 MED ORDER — IRBESARTAN 150 MG PO TABS: 150.0000 mg | ORAL_TABLET | Freq: Every day | ORAL | Status: DC

## 2017-07-12 MED ORDER — SODIUM CHLORIDE 0.9% FLUSH
10.0000 mL | Freq: Once | INTRAVENOUS | Status: AC
  Administered 2017-07-12: 10 mL
  Filled 2017-07-12: qty 10

## 2017-07-12 MED ORDER — ACETAMINOPHEN 325 MG PO TABS: 650.0000 mg | ORAL_TABLET | Freq: Four times a day (QID) | ORAL | Status: DC | PRN

## 2017-07-12 MED ORDER — PANTOPRAZOLE SODIUM 40 MG IV SOLR
40.0000 mg | Freq: Two times a day (BID) | INTRAVENOUS | Status: DC
  Administered 2017-07-12: 40 mg via INTRAVENOUS
  Filled 2017-07-12: qty 40

## 2017-07-12 MED ORDER — ONDANSETRON HCL 4 MG/2ML IJ SOLN: 4.0000 mg | Freq: Four times a day (QID) | INTRAMUSCULAR | Status: DC | PRN

## 2017-07-12 MED ORDER — HYDRALAZINE HCL 20 MG/ML IJ SOLN: 10.0000 mg | Freq: Four times a day (QID) | INTRAMUSCULAR | Status: DC | PRN

## 2017-07-12 MED ORDER — SIMVASTATIN 40 MG PO TABS
40.0000 mg | ORAL_TABLET | Freq: Every day | ORAL | Status: DC
  Administered 2017-07-12 – 2017-07-13 (×2): 40 mg via ORAL
  Filled 2017-07-12 (×2): qty 1

## 2017-07-12 MED ORDER — ALBUTEROL SULFATE (2.5 MG/3ML) 0.083% IN NEBU: 2.5000 mg | INHALATION_SOLUTION | RESPIRATORY_TRACT | Status: DC | PRN

## 2017-07-12 MED ORDER — TRAZODONE HCL 50 MG PO TABS: 50.0000 mg | ORAL_TABLET | Freq: Every evening | ORAL | Status: DC | PRN

## 2017-07-12 MED ORDER — SODIUM CHLORIDE 0.9% FLUSH: 3.0000 mL | INTRAVENOUS | Status: DC | PRN

## 2017-07-12 MED ORDER — METFORMIN HCL 500 MG PO TABS: 500.0000 mg | ORAL_TABLET | Freq: Two times a day (BID) | ORAL | Status: DC

## 2017-07-12 MED ORDER — SODIUM CHLORIDE 0.9 % IV SOLN
Freq: Once | INTRAVENOUS | Status: AC
  Administered 2017-07-12: 12:00:00 via INTRAVENOUS

## 2017-07-12 MED ORDER — SODIUM CHLORIDE 0.9 % IV SOLN
250.0000 mL | INTRAVENOUS | Status: DC | PRN
  Administered 2017-07-14: 08:00:00 via INTRAVENOUS

## 2017-07-12 NOTE — ED Notes (Signed)
Pt is alert and orinted x 4 and is verbally responsive pt denies pain at this time pt doe report that she has been having decreased strength and energy.

## 2017-07-12 NOTE — Telephone Encounter (Signed)
Patient scheduled per 1/17 sch message

## 2017-07-12 NOTE — ED Triage Notes (Addendum)
Pt brought to ED from cancer center after coming in for rectal bleeding and weakness. Pt is currently being treated for breast cancer and last treatment was on 1/11. Pt had labs drawn and the hemoglobin was 5.7 and the recent hemoglobin on 1/11 was 10.9. Pt reports being weak and unsteady on her feet. No n/v/d or chills.   BP 103/57 HR 116 RR 17 O2 100 T 97.9

## 2017-07-12 NOTE — H&P (Signed)
Patient Demographics:    Madison Stokes, is a 72 y.o. 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  GI consult from [facility] requested for possible endoluminal evaluation.  IV Protonix 40 mg every 12 hours  2)Acute blood loss anemia- POA, secondary to #1 above, treat as above #1  3)DM-hold metformin as patient will be on liquid diet only, Allow some permissive Hyperglycemia rather than risk life-threatening hypoglycemia in a patient with unreliable oral intake. Use Novolog/Humalog Sliding scale insulin with Accu-Cheks/Fingersticks as ordered  4)HTN-hold Avapro as blood pressure is "soft" due to acute blood loss,   may use IV Hydralazine 10 mg  Every 4 hours Prn for systolic blood pressure over 160 mmhg  5)Social/Ethics-advanced directives discussed with patient and husband at bedside, patient is a full code  6)Bil Breast Ca- Last chemo session 07/05/2017, no thrombocytopenia or leukopenia, f/u with Dr Burr Medico   With History of - Reviewed by me  Past Medical History:  Diagnosis Date  . Cancer ([place]) 04/2017   Bil Breast Ca  . Hyperlipidemia   . Hypertension       Past Surgical History:  Procedure Laterality Date  . DILATION AND CURETTAGE OF UTERUS    . PORTACATH PLACEMENT Right 05/14/2017   Procedure: ULTRASOUND GUIDED PORT PLACEMENT;  Surgeon: [name], MD;  Location: [place];  Service: General;  Laterality: Right;      Chief Complaint  Patient presents with  . Low Hemoglobin      HPI:    Madison Stokes  is a 72 y.o. female with past medical history relevant for bilateral breast cancer currently undergoing chemotherapy with last chemo session on 07/05/2017 as well as history of diabetes and hypertension presents to the ED with concerns about dyspnea on exertion, dizziness palpitations, fatigue and just feeling unwell.  No fever, no chills, no vomiting, no chest pains no pleuritic symptoms no leg swelling.  Starting on 07/09/2017 patient has had intermittent dark to maroon stools, no abdominal pain, last colonoscopy was about 5 years ago and apparently it was unremarkable, she does have hemorrhoids.  In ED.... Patient was found to have a hemoglobin of 5.7, hemoglobin on 05/31/2017 was 14.6, white count was elevated and platelets were  not low  Denies NSAID use, does take Decadron with the chemo sessions, no prior history of GI blood loss.   has been on 81 mg of aspirin for a long time  In ED..... Blood pressures were soft with systolic blood pressure in the 90s, heart rate above 100,   Additional history obtained from patient's husband at bedside,     Review of systems:    In addition to the HPI above,   A full 12 point Review of 10 Systems was done, except as stated above, all other Review of 10 Systems were negative.    Social History:  Reviewed by me    Social History   Tobacco Use  . Smoking status: Never Smoker  . Smokeless tobacco: Never Used  Substance Use Topics  . Alcohol use: Yes    Comment: 1 glass of wine per month       Family History :  Reviewed by me    Family History  Problem  Relation Age of Onset  . Cancer Mother 75       uterine  . Cancer Brother        prostate     Home Medications:   Prior to Admission medications   Medication Sig Start Date End Date Taking? Authorizing Provider  aspirin EC 81 MG tablet Take by mouth.    [provider]  dexamethasone (DECADRON) 4 MG tablet Take 2 tablets (8 mg total) 2 (two) times daily by mouth. Start the day before Taxotere. Then again the day after chemo for 3 days. 05/13/17   [name], MD  irbesartan Levy Sjogren) 150 MG tablet  02/21/17   [provider]  lidocaine-prilocaine (EMLA) cream Apply to affected area once 05/13/17   [name], MD  loperamide (IMODIUM) 2 MG capsule Take by mouth as needed for diarrhea or loose stools.    [provider]  loratadine (CLARITIN) 10 MG tablet Take 10 mg by mouth daily as needed for allergies.    [provider]  metFORMIN (GLUCOPHAGE) 500 MG tablet Take 1 tablet (500 mg total) by mouth 2 (two) times daily with a meal. 07/04/17   [name], MD  Multiple Vitamin (MULTIVITAMIN) tablet Take 1 tablet daily by mouth.    [provider]  ondansetron (ZOFRAN ODT) 8 MG disintegrating tablet Take 1 tablet (8 mg total) by mouth every 8 (eight) hours as needed for nausea or vomiting. Start on Day 3 post chemo 05/24/17   [name], MD  prochlorperazine (COMPAZINE) 10 MG tablet Take 1 tablet (10 mg total) every 6 (six) hours as needed by mouth (Nausea or vomiting). 05/13/17   [name], MD  simvastatin (ZOCOR) 40 MG tablet Take 40 mg at bedtime by mouth.    [provider]     Allergies:    No Known Allergies   Physical Exam:   Vitals  Blood pressure 96/64, pulse 97, temperature 98.1 F (36.7 C), temperature source Oral, resp. rate 14, height 5\' 2"  (1.575 m), weight 55.3 kg (122 lb), SpO2 98 %.  Physical Examination: General appearance - alert, well appearing, and in no distress  Mental status - alert, oriented to person, place, and  time,  Eyes - sclera anicteric Neck - supple, no JVD elevation , Chest - clear  to auscultation bilaterally, symmetrical air movement, left subclavian area Port-A-Cath site is clean dry and intact Heart - S1 and S2 normal, 2/6 SM, Tachy Abdomen - soft, nontender, nondistended, no CVA tenderness Neurological -  screening mental status exam normal, neck supple without rigidity, cranial nerves II through XII intact, DTR's normal and symmetric Extremities - no pedal edema noted, intact peripheral pulses  Skin - warm, dry Psych-affect is appropriate   Data Review:    CBC Recent Labs  Lab 07/12/17 0902  WBC 18.1*  HCT 17.4*  PLT 242  MCV 96.1  MCH 31.5  MCHC 32.8  RDW 16.9*  LYMPHSABS 1.4  MONOABS 1.6*  EOSABS 0.0  BASOSABS 0.0   Hgb =5.7   ------------------------------------------------------------------------------------------------------------------  Chemistries  Recent Labs  Lab 07/12/17 0902  NA 136  K 3.9  CL 103  CO2 25  GLUCOSE 115  BUN 14  CALCIUM 8.1*  AST 20  ALT 28  ALKPHOS 107  BILITOT <0.2*   ------------------------------------------------------------------------------------------------------------------ estimated creatinine clearance is 52.5 mL/min (by C-G formula based on SCr of 0.77 mg/dL). ------------------------------------------------------------------------------------------------------------------ No results for input(s): TSH, T4TOTAL, T3FREE, THYROIDAB in the last 72 hours.  Invalid input(s): FREET3   Coagulation profile Recent Labs  Lab 07/12/17 0902  INR 1.09   ------------------------------------------------------------------------------------------------------------------- No results for input(s): DDIMER in the last 72 hours. -------------------------------------------------------------------------------------------------------------------  Cardiac Enzymes No results for input(s): CKMB, TROPONINI, MYOGLOBIN in the last 168  hours.  Invalid input(s): CK ------------------------------------------------------------------------------------------------------------------ No results found for: BNP   ---------------------------------------------------------------------------------------------------------------  Urinalysis No results found for: COLORURINE, APPEARANCEUR, LABSPEC, [place], GLUCOSEU, HGBUR, BILIRUBINUR, KETONESUR, PROTEINUR, UROBILINOGEN, NITRITE, LEUKOCYTESUR  ----------------------------------------------------------------------------------------------------------------   Imaging Results:    No results found.  Radiological Exams on Admission: No results found.  DVT Prophylaxis -SCD   AM Labs Ordered, also please review Full Orders  Family Communication: Admission, patients condition and plan of care including tests being ordered have been discussed with the patient and husband who indicate understanding and agree with the plan   Code Status - Full Code  Likely DC to  home  Condition   stable  Roxan Hockey M.D on 07/12/2017 at 1:39 PM   Between 7am to 7pm - Pager - [phone] After 7pm go to [email or web address] - password [email or web address]  [facility] - Office  [phone]  Voice Recognition Viviann Spare dictation system was used to create this note, attempts have been made to correct errors. Please contact the author with questions and/or clarifications.

## 2017-07-12 NOTE — ED Provider Notes (Signed)
[place] DEPT Provider Note   CSN: [id number] Arrival date & time: 07/12/17  1042     History   Chief Complaint Chief Complaint  Patient presents with  . Low Hemoglobin    HPI Madison Stokes is a 72 y.o. female.  The history is provided by the patient. No language interpreter was used.   Madison Stokes is a 72 y.o. female who presents to the Emergency Department complaining of weakness.  She is currently receiving chemotherapy for breast cancer last treatment was 1 week ago.  Since her last treatment she reports increased generalized weakness and dyspnea on exertion.  She called her oncologist today and had labs scheduled.  She was referred from the cancer center for a hemoglobin of 5.  She does endorse hematochezia intermittently this week.  She had multiple episodes Tuesday of bright red and dark red blood in the stool.  She only had one episode of Wednesday at with a few additional episodes yesterday.  She denies any chest pain, fevers, abdominal pain, nausea, vomiting.  No prior similar symptoms.  She does not take any blood thinners and has no history of blood transfusion in the past. Past Medical History:  Diagnosis Date  . Cancer ([place]) 04/2017   Bil Breast Ca  . Hyperlipidemia   . Hypertension     Patient Active Problem List   Diagnosis Date Noted  . Acute GI bleed 07/12/2017  . Acute blood loss anemia 07/12/2017  . HTN (hypertension) 07/12/2017  . Type 2 diabetes mellitus without complication ([place]) 94/85/4627  . 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 Cancer of central portion of left breast ([place]) 05/07/2017    Past Surgical History:  Procedure Laterality Date  . DILATION AND CURETTAGE OF UTERUS    . PORTACATH PLACEMENT Right 05/14/2017   Procedure: ULTRASOUND GUIDED PORT PLACEMENT;  Surgeon: [name], MD;  Location: [place];  Service: General;  Laterality: Right;    OB  History    No data available       Home Medications    Prior to Admission medications   Medication Sig Start Date End Date Taking? Authorizing Provider  aspirin EC 81 MG tablet Take 81 mg by mouth daily.    Yes [provider]  dexamethasone (DECADRON) 4 MG tablet Take 2 tablets (8 mg total) 2 (two) times daily by mouth. Start the day before Taxotere. Then again the day after chemo for 3 days. Patient taking differently: Take 4-8 mg by mouth 2 (two) times daily. Start the day before Taxotere. Then again the day after chemo for 3 days. 05/13/17  Yes [name], MD  irbesartan (AVAPRO) 150 MG tablet Take 150 mg by mouth daily.  02/21/17  Yes [provider]  lidocaine-prilocaine (EMLA) cream Apply to affected area once 05/13/17  Yes [name], MD  loperamide (IMODIUM) 2 MG capsule Take by mouth as needed for diarrhea or loose stools.   Yes [provider]  loratadine (CLARITIN) 10 MG tablet Take 10 mg by mouth daily as needed for allergies.   Yes [provider]  metFORMIN (GLUCOPHAGE) 500 MG tablet Take 1 tablet (500 mg total) by mouth 2 (two) times daily with a meal. Patient taking differently: Take 500 mg by mouth 2 (two) times daily as needed (for B/S ONLY WHEN TAKING DECADRON).  07/04/17  Yes [name], MD  Multiple Vitamin (MULTIVITAMIN) tablet Take 1 tablet daily by mouth.  Yes [provider]  omeprazole (PRILOSEC OTC) 20 MG tablet Take 20 mg by mouth daily.   Yes [provider]  ondansetron (ZOFRAN ODT) 8 MG disintegrating tablet Take 1 tablet (8 mg total) by mouth every 8 (eight) hours as needed for nausea or vomiting. Start on Day 3 post chemo 05/24/17  Yes [name], MD  prochlorperazine (COMPAZINE) 10 MG tablet Take 1 tablet (10 mg total) every 6 (six) hours as needed by mouth (Nausea or vomiting). 05/13/17  Yes [name], MD  simvastatin (ZOCOR) 40 MG tablet Take 40 mg at bedtime by mouth.   Yes [provider]     Family History Family History  Problem Relation Age of Onset  . Cancer Mother 33       uterine  . Cancer Brother        prostate    Social History Social History   Tobacco Use  . Smoking status: Never Smoker  . Smokeless tobacco: Never Used  Substance Use Topics  . Alcohol use: Yes    Comment: 1 glass of wine per month  . Drug use: No     Allergies   Patient has no known allergies.   Review of Systems Review of Systems  All other systems reviewed and are negative.    Physical Exam Updated Vital Signs BP 113/78   Pulse 100   Temp 98.3 F (36.8 C)   Resp 19   Ht 5\' 2"  (1.575 m)   Wt 55.3 kg (122 lb)   SpO2 100%   BMI 22.31 kg/m   Physical Exam  Constitutional: She is oriented to person, place, and time. She appears well-developed and well-nourished.  HENT:  Head: Normocephalic and atraumatic.  Cardiovascular: Regular rhythm.  No murmur heard. Tachycardic  Pulmonary/Chest: Effort normal and breath sounds normal. No respiratory distress.  Abdominal: Soft. There is no tenderness. There is no rebound and no guarding.  Genitourinary:  Genitourinary Comments: Small nontender external hemorrhoid that is nonthrombosed.  No gross blood in the rectal vault.  Musculoskeletal: She exhibits no edema or tenderness.  Neurological: She is alert and oriented to person, place, and time.  Skin: Skin is warm and dry. There is pallor.  Psychiatric: She has a normal mood and affect. Her behavior is normal.  Nursing note and vitals reviewed.    ED Treatments / Results  Labs (all labs ordered are listed, but only abnormal results are displayed) Labs Reviewed  HEMOGLOBIN AND HEMATOCRIT, BLOOD  HEMOGLOBIN AND HEMATOCRIT, BLOOD  OCCULT BLOOD X 1 CARD TO LAB, STOOL  TYPE AND SCREEN  PREPARE RBC (CROSSMATCH)  ABO/RH    EKG  EKG Interpretation None       Radiology No results found.  Procedures Procedures (including critical care time) CRITICAL  CARE Performed by: Quintella Reichert   Total critical care time: 30 minutes  Critical care time was exclusive of separately billable procedures and treating other patients.  Critical care was necessary to treat or prevent imminent or life-threatening deterioration.  Critical care was time spent personally by me on the following activities: development of treatment plan with patient and/or surrogate as well as nursing, discussions with consultants, evaluation of patient's response to treatment, examination of patient, obtaining history from patient or surrogate, ordering and performing treatments and interventions, ordering and review of laboratory studies, ordering and review of radiographic studies, pulse oximetry and re-evaluation of patient's condition.   Medications Ordered in ED Medications  simvastatin (ZOCOR) tablet 40 mg (not administered)  multivitamin tablet 1 tablet (not administered)  prochlorperazine (COMPAZINE) tablet 10 mg (not administered)  ondansetron (ZOFRAN-ODT) disintegrating tablet 8 mg (not administered)  sodium chloride flush (NS) 0.9 % injection 3 mL (not administered)  sodium chloride flush (NS) 0.9 % injection 3 mL (not administered)  0.9 %  sodium chloride infusion (not administered)  acetaminophen (TYLENOL) tablet 650 mg (not administered)    Or  acetaminophen (TYLENOL) suppository 650 mg (not administered)  traZODone (DESYREL) tablet 50 mg (not administered)  senna (SENOKOT) tablet 8.6 mg (not administered)  polyethylene glycol (MIRALAX / GLYCOLAX) packet 17 g (not administered)  ondansetron (ZOFRAN) tablet 4 mg (not administered)    Or  ondansetron (ZOFRAN) injection 4 mg (not administered)  albuterol (PROVENTIL) (2.5 MG/3ML) 0.083% nebulizer solution 2.5 mg (not administered)  0.9 %  sodium chloride infusion (not administered)  oxyCODONE (Oxy IR/ROXICODONE) immediate release tablet 5 mg (not administered)  multivitamin with minerals tablet 1 tablet (not  administered)  pantoprazole (PROTONIX) injection 40 mg (not administered)  insulin aspart (novoLOG) injection 1 Units (not administered)  hydrALAZINE (APRESOLINE) injection 10 mg (not administered)  0.9 %  sodium chloride infusion ( Intravenous New Bag/Given 07/12/17 1229)     Initial Impression / Assessment and Plan / ED Course  I have reviewed the triage vital signs and the nursing notes.  Pertinent labs & imaging results that were available during my care of the patient were reviewed by me and considered in my medical decision making (see chart for details).     Patient referred from oncology service for evaluation of anemia with hemoglobin of 5.  She does endorse hematochezia for the last several days but no active bleeding in the emergency department.  She is tachycardic.  Hemoglobin has dropped from 10 to 5 over the last week.  Discussed importance of transfusion due to concern for symptomatic anemia.  Patient is in agreement with transfusion.  Medicine consulted for admission for further treatment.  Final Clinical Impressions(s) / ED Diagnoses   Final diagnoses:  Acute lower GI bleeding  Symptomatic anemia    ED Discharge Orders    None       Quintella Reichert, MD 07/12/17 1513

## 2017-07-12 NOTE — Patient Instructions (Signed)
Implanted Port Home Guide An implanted port is a type of central line that is placed under the skin. Central lines are used to provide IV access when treatment or nutrition needs to be given through a person's veins. Implanted ports are used for long-term IV access. An implanted port may be placed because:  You need IV medicine that would be irritating to the small veins in your hands or arms.  You need long-term IV medicines, such as antibiotics.  You need IV nutrition for a long period.  You need frequent blood draws for lab tests.  You need dialysis.  Implanted ports are usually placed in the chest area, but they can also be placed in the upper arm, the abdomen, or the leg. An implanted port has two main parts:  Reservoir. The reservoir is round and will appear as a small, raised area under your skin. The reservoir is the part where a needle is inserted to give medicines or draw blood.  Catheter. The catheter is a thin, flexible tube that extends from the reservoir. The catheter is placed into a large vein. Medicine that is inserted into the reservoir goes into the catheter and then into the vein.  How will I care for my incision site? Do not get the incision site wet. Bathe or shower as directed by your health care provider. How is my port accessed? Special steps must be taken to access the port:  Before the port is accessed, a numbing cream can be placed on the skin. This helps numb the skin over the port site.  Your health care provider uses a sterile technique to access the port. ? Your health care provider must put on a mask and sterile gloves. ? The skin over your port is cleaned carefully with an antiseptic and allowed to dry. ? The port is gently pinched between sterile gloves, and a needle is inserted into the port.  Only "non-coring" port needles should be used to access the port. Once the port is accessed, a blood return should be checked. This helps ensure that the port  is in the vein and is not clogged.  If your port needs to remain accessed for a constant infusion, a clear (transparent) bandage will be placed over the needle site. The bandage and needle will need to be changed every week, or as directed by your health care provider.  Keep the bandage covering the needle clean and dry. Do not get it wet. Follow your health care provider's instructions on how to take a shower or bath while the port is accessed.  If your port does not need to stay accessed, no bandage is needed over the port.  What is flushing? Flushing helps keep the port from getting clogged. Follow your health care provider's instructions on how and when to flush the port. Ports are usually flushed with saline solution or a medicine called heparin. The need for flushing will depend on how the port is used.  If the port is used for intermittent medicines or blood draws, the port will need to be flushed: ? After medicines have been given. ? After blood has been drawn. ? As part of routine maintenance.  If a constant infusion is running, the port may not need to be flushed.  How long will my port stay implanted? The port can stay in for as long as your health care provider thinks it is needed. When it is time for the port to come out, surgery will be   done to remove it. The procedure is similar to the one performed when the port was put in. When should I seek immediate medical care? When you have an implanted port, you should seek immediate medical care if:  You notice a bad smell coming from the incision site.  You have swelling, redness, or drainage at the incision site.  You have more swelling or pain at the port site or the surrounding area.  You have a fever that is not controlled with medicine.  This information is not intended to replace advice given to you by your health care provider. Make sure you discuss any questions you have with your health care provider. Document  Released: 06/11/2005 Document Revised: 11/17/2015 Document Reviewed: 02/16/2013 Elsevier Interactive Patient Education  2017 [employer]  

## 2017-07-12 NOTE — Progress Notes (Signed)
[facility] Progress Note   Madison Stokes [id number] September 13, 1945 72 y.o.  Madison Stokes is managed by Dr. [name]  Actively treated with chemotherapy: yes  Current Therapy: Carboplatin, docetaxel, pertuzumab, and trastuzumab with Neulasta support  Last Treated: 07/05/2017 (cycle 3, day 1)  Assessment: Plan:    Gastrointestinal hemorrhage, unspecified gastrointestinal hemorrhage type  Acute blood loss anemia   Gastrointestinal hemorrhage with acute blood loss anemia: Review of the patient's chart shows that her hemoglobin was 10.9 and hematocrit 32.9 on 07/04/2017.  Her hemoglobin today returned at 5.7 with her hematocrit at 17.4.  The patient was transported to the emergency room for evaluation and management.  Please see After Visit Summary for patient specific instructions.  Future Appointments  Date Time Provider [place]  07/25/2017  9:00 AM [facility] LAB 6 [facility] None  07/25/2017  9:15 AM [facility] FLUSH NURSE 2 [facility] None  07/25/2017  9:45 AM [name], MD [facility] None  07/26/2017  8:00 AM [facility] E14 [facility] None  08/15/2017  8:45 AM [facility] LAB 2 [facility] None  08/15/2017  9:00 AM [facility] FLUSH NURSE [facility] None  08/15/2017  9:30 AM [name], MD [facility] None  08/16/2017  8:00 AM [facility] E14 [facility] None    No orders of the defined types were placed in this encounter.      Subjective:   Patient ID:  Madison Stokes is a 72 y.o. (DOB [birth year]) female.  Chief Complaint:  Chief Complaint  Patient presents with  . 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  She is followed by Dr. [name].  She is currently being  treated with carboplatin, docetaxel, pertuzumab, and trastuzumab with Neulasta support.  Cycle 3-day 1 of chemotherapy was dosed on 07/05/2017.  She presents to the office today reporting that she has had diarrhea with gross rectal bleeding several times over the last 2 days.  She is having weakness, unsteadiness, and shortness of breath.  She states that she passed out while sitting on the commode.  She did not take any of her medications yesterday but did take her blood pressure medications this morning.  She denies nausea, vomiting, diarrhea, fevers, chills, chest tightness, chest pain, or chest pressure.  Medications: I have reviewed the patient's current medications.  Allergies: No Known Allergies  Past Medical History:  Diagnosis Date  . Cancer ([place]) 04/2017   Bil Breast Ca  . Hyperlipidemia   . Hypertension     Past Surgical History:  Procedure Laterality Date  . DILATION AND CURETTAGE OF UTERUS    . PORTACATH PLACEMENT Right 05/14/2017   Procedure: ULTRASOUND GUIDED PORT PLACEMENT;  Surgeon: [name], MD;  Location: [facility];  Service: General;  Laterality: Right;    Family History  Problem Relation Age of Onset  . Cancer Mother 75       uterine  . Cancer Brother        prostate    Social History   Socioeconomic History  . Marital status: Married    Spouse name: Not on file  . Number of children: Not on file  . Years of education: Not on file  . Highest education level: Not on file  Social  Needs  . Financial resource strain: Not on file  . Food insecurity - worry: Not on file  . Food insecurity - inability: Not on file  . Transportation needs - medical: Not on file  . Transportation needs - non-medical: Not on file  Occupational History  . Not on file  Tobacco Use  . Smoking status: Never Smoker  . Smokeless tobacco: Never Used  Substance and Sexual Activity  . Alcohol use: Yes    Comment: 1 glass of wine per month  . Drug use: No  .  Sexual activity: Not on file  Other Topics Concern  . Not on file  Social History Narrative  . Not on file    Past Medical History, Surgical history, Social history, and Family history were reviewed and updated as appropriate.   Please see review of systems for further details on the patient's review from today.   Review of Systems:  Review of Systems  Constitutional: Positive for fatigue. Negative for chills, diaphoresis and fever.  Respiratory: Positive for shortness of breath. Negative for cough, choking and chest tightness.   Cardiovascular: Positive for palpitations. Negative for chest pain and leg swelling.  Gastrointestinal: Positive for anal bleeding, blood in stool, diarrhea, nausea and vomiting. Negative for abdominal pain and constipation.  Neurological: Positive for syncope and weakness.    Objective:   Physical Exam:  BP (!) 103/57 (BP Location: Left Arm, Patient Position: Sitting)   Pulse (!) 116 Comment: told rn beth   Temp 97.9 F (36.6 C) (Oral)   Resp 17   Ht '5\' 2"'  (1.575 m)   Wt 122 lb 9.6 oz (55.6 kg)   SpO2 100%   BMI 22.42 kg/m  ECOG: 1  Physical Exam  Constitutional: No distress.  HENT:  Head: Normocephalic and atraumatic.  Eyes: Right eye exhibits no discharge. Left eye exhibits no discharge. No scleral icterus.  Conjunctiva is pale bilaterally.  Cardiovascular: S1 normal and S2 normal. Tachycardia present.  Pulmonary/Chest: Effort normal and breath sounds normal. No respiratory distress. She has no wheezes. She has no rales.  Abdominal: Soft. Bowel sounds are normal. She exhibits no distension. There is no tenderness. There is no guarding.  Musculoskeletal: She exhibits no edema.  Neurological: She is alert. Coordination normal.  Skin: Skin is warm and dry. She is not diaphoretic. There is pallor.  Decreased capillary refill.    Lab Review:     Component Value Date/Time   NA 136 07/12/2017 0902   NA 142 06/13/2017 1307   K 3.9 07/12/2017  0902   K 4.1 06/13/2017 1307   CL 103 07/12/2017 0902   CO2 25 07/12/2017 0902   CO2 23 06/13/2017 1307   GLUCOSE 115 07/12/2017 0902   GLUCOSE 224 (H) 06/13/2017 1307   BUN 14 07/12/2017 0902   BUN 17.3 06/13/2017 1307   CREATININE 0.77 07/04/2017 0846   CREATININE 0.9 06/13/2017 1307   CALCIUM 8.1 (L) 07/12/2017 0902   CALCIUM 8.9 06/13/2017 1307   PROT 5.2 (L) 07/12/2017 0902   PROT 6.4 06/13/2017 1307   ALBUMIN 3.0 (L) 07/12/2017 0902   ALBUMIN 3.5 06/13/2017 1307   AST 20 07/12/2017 0902   AST 31 06/13/2017 1307   ALT 28 07/12/2017 0902   ALT 50 06/13/2017 1307   ALKPHOS 107 07/12/2017 0902   ALKPHOS 124 06/13/2017 1307   BILITOT <0.2 (L) 07/12/2017 0902   BILITOT 0.24 06/13/2017 1307   GFRNONAA >60 07/12/2017 0902   GFRAA >60 07/12/2017 0902  Component Value Date/Time   WBC 18.1 (H) 07/12/2017 0902   WBC 7.1 07/04/2017 0846   RBC 1.81 (L) 07/12/2017 0902   HGB 10.9 (L) 07/04/2017 0846   HGB 12.5 06/13/2017 1307   HCT 17.4 (L) 07/12/2017 0902   HCT 37.2 06/13/2017 1307   PLT 242 07/12/2017 0902   PLT 289 06/13/2017 1307   MCV 96.1 07/12/2017 0902   MCV 93.5 06/13/2017 1307   MCH 31.5 07/12/2017 0902   MCHC 32.8 07/12/2017 0902   RDW 16.9 (H) 07/12/2017 0902   RDW 13.8 06/13/2017 1307   LYMPHSABS 1.4 07/12/2017 0902   LYMPHSABS 0.4 (L) 06/13/2017 1307   MONOABS 1.6 (H) 07/12/2017 0902   MONOABS 0.0 (L) 06/13/2017 1307   EOSABS 0.0 07/12/2017 0902   EOSABS 0.0 06/13/2017 1307   BASOSABS 0.0 07/12/2017 0902   BASOSABS 0.0 06/13/2017 1307   -------------------------------  Imaging from last 24 hours (if applicable):  Radiology interpretation: No results found.

## 2017-07-12 NOTE — Progress Notes (Signed)
Pt.HGB is 5.7 today, down from 10.9 1 week ago. She was seen by Sandi Mealy, PA. Decision made to send pt to ED for further work up.  Transported w/c to ED  Room # 7 after ED notified per Sandi Mealy, PA Report given to [facility], [place]. Husband accompanied pt.  Port dressing changed to [place] with biopatch prior to transport.

## 2017-07-12 NOTE — H&P (View-Only) (Signed)
Subjective:   HPI  The patient is a 72 year old female who is undergoing chemotherapy for breast cancer. Her last chemotherapy was on Friday 1 week ago. On Tuesday of this week (3 days ago) she experienced dark blackish maroonish-colored stool. She had it again on Wednesday but not as much and only a little bit yesterday and none today. She was becoming progressively weaker and short of breath with exertion and therefore came to the emergency room where she was evaluated and found to have a hemoglobin of 5.7 and a hematocrit of 17.4. She denies hematemesis. She takes a baby aspirin, but no other NSAIDs. She has never had an ulcer. She denies abdominal pain. She states she had a normal colonoscopy 5 years ago in [place].  Review of Systems No chest pain  Past Medical History:  Diagnosis Date  . Cancer ([place]) 04/2017   Bil Breast Ca  . Hyperlipidemia   . Hypertension    Past Surgical History:  Procedure Laterality Date  . DILATION AND CURETTAGE OF UTERUS    . PORTACATH PLACEMENT Right 05/14/2017   Procedure: ULTRASOUND GUIDED PORT PLACEMENT;  Surgeon: [name], MD;  Location: [facility];  Service: General;  Laterality: Right;   Social History   Socioeconomic History  . Marital status: Married    Spouse name: Not on file  . Number of children: Not on file  . Years of education: Not on file  . Highest education level: Not on file  Social Needs  . Financial resource strain: Not on file  . Food insecurity - worry: Not on file  . Food insecurity - inability: Not on file  . Transportation needs - medical: Not on file  . Transportation needs - non-medical: Not on file  Occupational History  . Not on file  Tobacco Use  . Smoking status: Never Smoker  . Smokeless tobacco: Never Used  Substance and Sexual Activity  . Alcohol use: Yes    Comment: 1 glass of wine per month  . Drug use: No  . Sexual activity: Not on file  Other Topics Concern  .  Not on file  Social History Narrative  . Not on file   family history includes Cancer in her brother; Cancer (age of onset: 5) in her mother.  Current Facility-Administered Medications:  .  0.9 %  sodium chloride infusion, 250 mL, Intravenous, PRN, Emokpae, Courage, MD .  0.9 %  sodium chloride infusion, , Intravenous, Continuous, Emokpae, Courage, MD .  acetaminophen (TYLENOL) tablet 650 mg, 650 mg, Oral, Q6H PRN **OR** acetaminophen (TYLENOL) suppository 650 mg, 650 mg, Rectal, Q6H PRN, Emokpae, Courage, MD .  albuterol (PROVENTIL) (2.5 MG/3ML) 0.083% nebulizer solution 2.5 mg, 2.5 mg, Nebulization, Q2H PRN, Emokpae, Courage, MD .  hydrALAZINE (APRESOLINE) injection 10 mg, 10 mg, Intravenous, Q6H PRN, Emokpae, Courage, MD .  insulin aspart (novoLOG) injection 1 Units, 1 Units, Subcutaneous, BID WC, Emokpae, Courage, MD .  multivitamin tablet 1 tablet, 1 tablet, Oral, Daily, Emokpae, Courage, MD .  multivitamin with minerals tablet 1 tablet, 1 tablet, Oral, Daily, Emokpae, Courage, MD .  ondansetron (ZOFRAN) tablet 4 mg, 4 mg, Oral, Q6H PRN **OR** ondansetron (ZOFRAN) injection 4 mg, 4 mg, Intravenous, Q6H PRN, Emokpae, Courage, MD .  ondansetron (ZOFRAN-ODT) disintegrating tablet 8 mg, 8 mg, Oral, Q8H PRN, Emokpae, Courage, MD .  oxyCODONE (Oxy IR/ROXICODONE) immediate release tablet 5 mg, 5 mg, Oral, Q4H PRN, Emokpae, Courage, MD .  pantoprazole (PROTONIX) injection 40 mg, 40  mg, Intravenous, Q12H, Emokpae, Courage, MD .  polyethylene glycol (MIRALAX / GLYCOLAX) packet 17 g, 17 g, Oral, Daily PRN, Emokpae, Courage, MD .  prochlorperazine (COMPAZINE) tablet 10 mg, 10 mg, Oral, Q6H PRN, Emokpae, Courage, MD .  senna (SENOKOT) tablet 8.6 mg, 1 tablet, Oral, BID, Emokpae, Courage, MD .  simvastatin (ZOCOR) tablet 40 mg, 40 mg, Oral, QHS, Emokpae, Courage, MD .  sodium chloride flush (NS) 0.9 % injection 3 mL, 3 mL, Intravenous, Q12H, Emokpae, Courage, MD .  sodium chloride flush (NS) 0.9 %  injection 3 mL, 3 mL, Intravenous, PRN, Emokpae, Courage, MD .  traZODone (DESYREL) tablet 50 mg, 50 mg, Oral, QHS PRN, Emokpae, Courage, MD  Current Outpatient Medications:  .  aspirin EC 81 MG tablet, Take 81 mg by mouth daily. , Disp: , Rfl:  .  dexamethasone (DECADRON) 4 MG tablet, Take 2 tablets (8 mg total) 2 (two) times daily by mouth. Start the day before Taxotere. Then again the day after chemo for 3 days. (Patient taking differently: Take 4-8 mg by mouth 2 (two) times daily. Start the day before Taxotere. Then again the day after chemo for 3 days.), Disp: 30 tablet, Rfl: 1 .  irbesartan (AVAPRO) 150 MG tablet, Take 150 mg by mouth daily. , Disp: , Rfl:  .  lidocaine-prilocaine (EMLA) cream, Apply to affected area once, Disp: 30 g, Rfl: 3 .  loperamide (IMODIUM) 2 MG capsule, Take by mouth as needed for diarrhea or loose stools., Disp: , Rfl:  .  loratadine (CLARITIN) 10 MG tablet, Take 10 mg by mouth daily as needed for allergies., Disp: , Rfl:  .  metFORMIN (GLUCOPHAGE) 500 MG tablet, Take 1 tablet (500 mg total) by mouth 2 (two) times daily with a meal. (Patient taking differently: Take 500 mg by mouth 2 (two) times daily as needed (for B/S ONLY WHEN TAKING DECADRON). ), Disp: 60 tablet, Rfl: 0 .  Multiple Vitamin (MULTIVITAMIN) tablet, Take 1 tablet daily by mouth., Disp: , Rfl:  .  omeprazole (PRILOSEC OTC) 20 MG tablet, Take 20 mg by mouth daily., Disp: , Rfl:  .  ondansetron (ZOFRAN ODT) 8 MG disintegrating tablet, Take 1 tablet (8 mg total) by mouth every 8 (eight) hours as needed for nausea or vomiting. Start on Day 3 post chemo, Disp: 20 tablet, Rfl: 0 .  prochlorperazine (COMPAZINE) 10 MG tablet, Take 1 tablet (10 mg total) every 6 (six) hours as needed by mouth (Nausea or vomiting)., Disp: 30 tablet, Rfl: 1 .  simvastatin (ZOCOR) 40 MG tablet, Take 40 mg at bedtime by mouth., Disp: , Rfl:  No Known Allergies   Objective:     BP 113/78   Pulse 100   Temp 98.3 F (36.8 C)    Resp 19   Ht 5\' 2"  (1.575 m)   Wt 55.3 kg (122 lb)   SpO2 100%   BMI 22.31 kg/m   Alert and oriented  No acute distress  Skin pale  Heart regular rhythm no murmurs  Lungs clear  Abdomen: Bowel sounds normal, soft, nontender, no hepatosplenomegaly  Laboratory No components found for: D1    Assessment:     GI bleed.. Etiology unclear.  Breast cancer      Plan:     Admit to hospital. Transfuse blood. PPI therapy. Plan EGD tomorrow. If negative consider repeat colonoscopy. Lab Results  Component Value Date   HGB 10.9 (L) 07/04/2017   HGB 12.5 06/13/2017   HGB 14.6 05/31/2017  HGB 14.1 05/23/2017   HCT 17.4 (L) 07/12/2017   HCT 32.9 (L) 07/04/2017   HCT 37.2 06/13/2017   HCT 44.7 05/31/2017   HCT 43.0 05/23/2017   ALKPHOS 107 07/12/2017   ALKPHOS 115 07/04/2017   ALKPHOS 124 06/13/2017   ALKPHOS 137 05/31/2017   ALKPHOS 109 05/23/2017   AST 20 07/12/2017   AST 23 07/04/2017   AST 31 06/13/2017   AST 22 05/31/2017   AST 27 05/23/2017   ALT 28 07/12/2017   ALT 32 07/04/2017   ALT 50 06/13/2017   ALT 54 05/31/2017   ALT 33 05/23/2017

## 2017-07-12 NOTE — ED Notes (Signed)
Bed: WA07 Expected date:  Expected time:  Means of arrival:  Comments: Ca ctr pt- Hgb 5.7

## 2017-07-12 NOTE — Consult Note (Signed)
Subjective:   HPI  The patient is a 72 year old female who is undergoing chemotherapy for breast cancer. Her last chemotherapy was on Friday 1 week ago. On Tuesday of this week (3 days ago) she experienced dark blackish maroonish-colored stool. She had it again on Wednesday but not as much and only a little bit yesterday and none today. She was becoming progressively weaker and short of breath with exertion and therefore came to the emergency room where she was evaluated and found to have a hemoglobin of 5.7 and a hematocrit of 17.4. She denies hematemesis. She takes a baby aspirin, but no other NSAIDs. She has never had an ulcer. She denies abdominal pain. She states she had a normal colonoscopy 5 years ago in [place].  Review of Systems No chest pain  Past Medical History:  Diagnosis Date  . Cancer ([place]) 04/2017   Bil Breast Ca  . Hyperlipidemia   . Hypertension    Past Surgical History:  Procedure Laterality Date  . DILATION AND CURETTAGE OF UTERUS    . PORTACATH PLACEMENT Right 05/14/2017   Procedure: ULTRASOUND GUIDED PORT PLACEMENT;  Surgeon: [name], MD;  Location: [facility];  Service: General;  Laterality: Right;   Social History   Socioeconomic History  . Marital status: Married    Spouse name: Not on file  . Number of children: Not on file  . Years of education: Not on file  . Highest education level: Not on file  Social Needs  . Financial resource strain: Not on file  . Food insecurity - worry: Not on file  . Food insecurity - inability: Not on file  . Transportation needs - medical: Not on file  . Transportation needs - non-medical: Not on file  Occupational History  . Not on file  Tobacco Use  . Smoking status: Never Smoker  . Smokeless tobacco: Never Used  Substance and Sexual Activity  . Alcohol use: Yes    Comment: 1 glass of wine per month  . Drug use: No  . Sexual activity: Not on file  Other Topics Concern  .  Not on file  Social History Narrative  . Not on file   family history includes Cancer in her brother; Cancer (age of onset: 10) in her mother.  Current Facility-Administered Medications:  .  0.9 %  sodium chloride infusion, 250 mL, Intravenous, PRN, Emokpae, Courage, MD .  0.9 %  sodium chloride infusion, , Intravenous, Continuous, Emokpae, Courage, MD .  acetaminophen (TYLENOL) tablet 650 mg, 650 mg, Oral, Q6H PRN **OR** acetaminophen (TYLENOL) suppository 650 mg, 650 mg, Rectal, Q6H PRN, Emokpae, Courage, MD .  albuterol (PROVENTIL) (2.5 MG/3ML) 0.083% nebulizer solution 2.5 mg, 2.5 mg, Nebulization, Q2H PRN, Emokpae, Courage, MD .  hydrALAZINE (APRESOLINE) injection 10 mg, 10 mg, Intravenous, Q6H PRN, Emokpae, Courage, MD .  insulin aspart (novoLOG) injection 1 Units, 1 Units, Subcutaneous, BID WC, Emokpae, Courage, MD .  multivitamin tablet 1 tablet, 1 tablet, Oral, Daily, Emokpae, Courage, MD .  multivitamin with minerals tablet 1 tablet, 1 tablet, Oral, Daily, Emokpae, Courage, MD .  ondansetron (ZOFRAN) tablet 4 mg, 4 mg, Oral, Q6H PRN **OR** ondansetron (ZOFRAN) injection 4 mg, 4 mg, Intravenous, Q6H PRN, Emokpae, Courage, MD .  ondansetron (ZOFRAN-ODT) disintegrating tablet 8 mg, 8 mg, Oral, Q8H PRN, Emokpae, Courage, MD .  oxyCODONE (Oxy IR/ROXICODONE) immediate release tablet 5 mg, 5 mg, Oral, Q4H PRN, Emokpae, Courage, MD .  pantoprazole (PROTONIX) injection 40 mg, 40  mg, Intravenous, Q12H, Emokpae, Courage, MD .  polyethylene glycol (MIRALAX / GLYCOLAX) packet 17 g, 17 g, Oral, Daily PRN, Emokpae, Courage, MD .  prochlorperazine (COMPAZINE) tablet 10 mg, 10 mg, Oral, Q6H PRN, Emokpae, Courage, MD .  senna (SENOKOT) tablet 8.6 mg, 1 tablet, Oral, BID, Emokpae, Courage, MD .  simvastatin (ZOCOR) tablet 40 mg, 40 mg, Oral, QHS, Emokpae, Courage, MD .  sodium chloride flush (NS) 0.9 % injection 3 mL, 3 mL, Intravenous, Q12H, Emokpae, Courage, MD .  sodium chloride flush (NS) 0.9 %  injection 3 mL, 3 mL, Intravenous, PRN, Emokpae, Courage, MD .  traZODone (DESYREL) tablet 50 mg, 50 mg, Oral, QHS PRN, Emokpae, Courage, MD  Current Outpatient Medications:  .  aspirin EC 81 MG tablet, Take 81 mg by mouth daily. , Disp: , Rfl:  .  dexamethasone (DECADRON) 4 MG tablet, Take 2 tablets (8 mg total) 2 (two) times daily by mouth. Start the day before Taxotere. Then again the day after chemo for 3 days. (Patient taking differently: Take 4-8 mg by mouth 2 (two) times daily. Start the day before Taxotere. Then again the day after chemo for 3 days.), Disp: 30 tablet, Rfl: 1 .  irbesartan (AVAPRO) 150 MG tablet, Take 150 mg by mouth daily. , Disp: , Rfl:  .  lidocaine-prilocaine (EMLA) cream, Apply to affected area once, Disp: 30 g, Rfl: 3 .  loperamide (IMODIUM) 2 MG capsule, Take by mouth as needed for diarrhea or loose stools., Disp: , Rfl:  .  loratadine (CLARITIN) 10 MG tablet, Take 10 mg by mouth daily as needed for allergies., Disp: , Rfl:  .  metFORMIN (GLUCOPHAGE) 500 MG tablet, Take 1 tablet (500 mg total) by mouth 2 (two) times daily with a meal. (Patient taking differently: Take 500 mg by mouth 2 (two) times daily as needed (for B/S ONLY WHEN TAKING DECADRON). ), Disp: 60 tablet, Rfl: 0 .  Multiple Vitamin (MULTIVITAMIN) tablet, Take 1 tablet daily by mouth., Disp: , Rfl:  .  omeprazole (PRILOSEC OTC) 20 MG tablet, Take 20 mg by mouth daily., Disp: , Rfl:  .  ondansetron (ZOFRAN ODT) 8 MG disintegrating tablet, Take 1 tablet (8 mg total) by mouth every 8 (eight) hours as needed for nausea or vomiting. Start on Day 3 post chemo, Disp: 20 tablet, Rfl: 0 .  prochlorperazine (COMPAZINE) 10 MG tablet, Take 1 tablet (10 mg total) every 6 (six) hours as needed by mouth (Nausea or vomiting)., Disp: 30 tablet, Rfl: 1 .  simvastatin (ZOCOR) 40 MG tablet, Take 40 mg at bedtime by mouth., Disp: , Rfl:  No Known Allergies   Objective:     BP 113/78   Pulse 100   Temp 98.3 F (36.8 C)    Resp 19   Ht 5\' 2"  (1.575 m)   Wt 55.3 kg (122 lb)   SpO2 100%   BMI 22.31 kg/m   Alert and oriented  No acute distress  Skin pale  Heart regular rhythm no murmurs  Lungs clear  Abdomen: Bowel sounds normal, soft, nontender, no hepatosplenomegaly  Laboratory No components found for: D1    Assessment:     GI bleed.. Etiology unclear.  Breast cancer      Plan:     Admit to hospital. Transfuse blood. PPI therapy. Plan EGD tomorrow. If negative consider repeat colonoscopy. Lab Results  Component Value Date   HGB 10.9 (L) 07/04/2017   HGB 12.5 06/13/2017   HGB 14.6 05/31/2017  HGB 14.1 05/23/2017   HCT 17.4 (L) 07/12/2017   HCT 32.9 (L) 07/04/2017   HCT 37.2 06/13/2017   HCT 44.7 05/31/2017   HCT 43.0 05/23/2017   ALKPHOS 107 07/12/2017   ALKPHOS 115 07/04/2017   ALKPHOS 124 06/13/2017   ALKPHOS 137 05/31/2017   ALKPHOS 109 05/23/2017   AST 20 07/12/2017   AST 23 07/04/2017   AST 31 06/13/2017   AST 22 05/31/2017   AST 27 05/23/2017   ALT 28 07/12/2017   ALT 32 07/04/2017   ALT 50 06/13/2017   ALT 54 05/31/2017   ALT 33 05/23/2017

## 2017-07-12 NOTE — Telephone Encounter (Signed)
Added per Jenny Reichmann

## 2017-07-13 ENCOUNTER — Encounter (HOSPITAL_COMMUNITY)
Admission: EM | Disposition: A | Discharge status: Home / Self Care | Payer: Self-pay | Source: Home / Self Care | Attending: Internal Medicine

## 2017-07-13 ENCOUNTER — Inpatient Hospital Stay (HOSPITAL_COMMUNITY): Payer: Medicare Other | Admitting: Anesthesiology

## 2017-07-13 ENCOUNTER — Encounter (HOSPITAL_COMMUNITY): Payer: Self-pay

## 2017-07-13 DIAGNOSIS — K922 Gastrointestinal hemorrhage, unspecified: Principal | ICD-10-CM

## 2017-07-13 HISTORY — PX: ESOPHAGOGASTRODUODENOSCOPY: SHX5428

## 2017-07-13 LAB — CBC
HCT: 27.6 % — ABNORMAL LOW (ref 36.0–46.0)
Hemoglobin: 9.4 g/dL — ABNORMAL LOW (ref 12.0–15.0)
MCH: 31.6 pg (ref 26.0–34.0)
MCHC: 34.1 g/dL (ref 30.0–36.0)
MCV: 92.9 fL (ref 78.0–100.0)
Platelets: 221 10*3/uL (ref 150–400)
RBC: 2.97 MIL/uL — ABNORMAL LOW (ref 3.87–5.11)
RDW: 16.9 % — ABNORMAL HIGH (ref 11.5–15.5)
WBC: 23.8 10*3/uL — ABNORMAL HIGH (ref 4.0–10.5)

## 2017-07-13 LAB — TYPE AND SCREEN
ABO/RH(D): O POS
Antibody Screen: NEGATIVE
Unit division: 0
Unit division: 0

## 2017-07-13 LAB — BASIC METABOLIC PANEL
Anion gap: 6 (ref 5–15)
BUN: 10 mg/dL (ref 6–20)
CO2: 25 mmol/L (ref 22–32)
Calcium: 8.2 mg/dL — ABNORMAL LOW (ref 8.9–10.3)
Chloride: 107 mmol/L (ref 101–111)
Creatinine, Ser: 0.75 mg/dL (ref 0.44–1.00)
GFR calc Af Amer: >60 mL/min (ref >60)
GFR calc non Af Amer: >60 mL/min (ref >60)
Glucose, Bld: 90 mg/dL (ref 65–99)
Potassium: 3.8 mmol/L (ref 3.5–5.1)
Sodium: 138 mmol/L (ref 135–145)

## 2017-07-13 LAB — BPAM RBC
Blood Product Expiration Date: 201902152359
Blood Product Expiration Date: 201902152359
ISSUE DATE / TIME: 201901181520
ISSUE DATE / TIME: 201901182007
Unit Type and Rh: 5100
Unit Type and Rh: 5100

## 2017-07-13 LAB — OCCULT BLOOD X 1 CARD TO LAB, STOOL: Fecal Occult Bld: POSITIVE — AB

## 2017-07-13 LAB — GLUCOSE, CAPILLARY
Glucose-Capillary: 92 mg/dL (ref 65–99)
Glucose-Capillary: 89 mg/dL (ref 65–99)
Glucose-Capillary: 98 mg/dL (ref 65–99)

## 2017-07-13 LAB — HEMOGLOBIN AND HEMATOCRIT, BLOOD
HCT: 24.3 % — ABNORMAL LOW (ref 36.0–46.0)
HCT: 27.9 % — ABNORMAL LOW (ref 36.0–46.0)
Hemoglobin: 8.4 g/dL — ABNORMAL LOW (ref 12.0–15.0)
Hemoglobin: 9.5 g/dL — ABNORMAL LOW (ref 12.0–15.0)

## 2017-07-13 SURGERY — EGD (ESOPHAGOGASTRODUODENOSCOPY)
Anesthesia: Monitor Anesthesia Care

## 2017-07-13 MED ORDER — DEXTROSE 5 % IV SOLN: 1.0000 g | INTRAVENOUS | Status: DC

## 2017-07-13 MED ORDER — PEG 3350-KCL-NA BICARB-NACL 420 G PO SOLR: 4000.0000 mL | Freq: Once | ORAL | Status: DC

## 2017-07-13 MED ORDER — PANTOPRAZOLE SODIUM 40 MG IV SOLR: 40.0000 mg | INTRAVENOUS | Status: DC

## 2017-07-13 MED ORDER — LIDOCAINE 2% (20 MG/ML) 5 ML SYRINGE
INTRAMUSCULAR | Status: DC | PRN
  Administered 2017-07-13: 100 mg via INTRAVENOUS

## 2017-07-13 MED ORDER — PROPOFOL 10 MG/ML IV BOLUS
INTRAVENOUS | Status: AC
  Filled 2017-07-13: qty 40

## 2017-07-13 MED ORDER — PROPOFOL 500 MG/50ML IV EMUL
INTRAVENOUS | Status: DC | PRN
  Administered 2017-07-13: 120 ug/kg/min via INTRAVENOUS

## 2017-07-13 MED ORDER — PEG 3350-KCL-NA BICARB-NACL 420 G PO SOLR
4000.0000 mL | Freq: Once | ORAL | Status: AC
  Administered 2017-07-13: 4000 mL via ORAL

## 2017-07-13 MED ORDER — PROPOFOL 10 MG/ML IV BOLUS
INTRAVENOUS | Status: DC | PRN
  Administered 2017-07-13 (×2): 20 mg via INTRAVENOUS

## 2017-07-13 MED ORDER — PROMETHAZINE HCL 25 MG/ML IJ SOLN: 6.2500 mg | INTRAMUSCULAR | Status: DC | PRN

## 2017-07-13 MED ORDER — SODIUM CHLORIDE 0.9 % IV SOLN: INTRAVENOUS | Status: DC

## 2017-07-13 MED ORDER — SODIUM CHLORIDE 0.9 % IV SOLN
INTRAVENOUS | Status: DC
  Administered 2017-07-13: 11:00:00 via INTRAVENOUS

## 2017-07-13 MED ORDER — DEXTROSE 5 % IV SOLN
1.0000 g | INTRAVENOUS | Status: DC
  Administered 2017-07-13: 1 g via INTRAVENOUS
  Filled 2017-07-13: qty 10

## 2017-07-13 NOTE — Anesthesia Postprocedure Evaluation (Signed)
Anesthesia Post Note  Patient: Madison Stokes  Procedure(s) Performed: ESOPHAGOGASTRODUODENOSCOPY (EGD) (N/A )     Patient location during evaluation: PACU Anesthesia Type: MAC Level of consciousness: awake and alert Pain management: pain level controlled Vital Signs Assessment: post-procedure vital signs reviewed and stable Respiratory status: spontaneous breathing, nonlabored ventilation, respiratory function stable and patient connected to nasal cannula oxygen Cardiovascular status: stable and blood pressure returned to baseline Postop Assessment: no apparent nausea or vomiting Anesthetic complications: no    Last Vitals:  Vitals:   07/13/17 0950 07/13/17 1000  BP: 120/69 137/81  Pulse: 92 84  Resp: 15 12  Temp:    SpO2: 98% 99%    Last Pain:  Vitals:   07/13/17 0943  TempSrc: Oral  PainSc:                  Aleshia Cartelli S

## 2017-07-13 NOTE — Brief Op Note (Signed)
07/12/2017 - 07/13/2017  9:45 AM  PATIENT:  Madison Stokes  72 y.o. female  PRE-OPERATIVE DIAGNOSIS:  GI bleed  POST-OPERATIVE DIAGNOSIS:  duodenitis  PROCEDURE:  Procedure(s): ESOPHAGOGASTRODUODENOSCOPY (EGD) (N/A)  SURGEON:  Surgeon(s) and Role:    Ronnette Juniper, MD - Primary  PHYSICIAN ASSISTANT:   ASSISTANTS: Burtis Junes, RN, Janie Billups, Tech  ANESTHESIA:   MAC  EBL:  none  BLOOD ADMINISTERED:none  DRAINS: none   LOCAL MEDICATIONS USED:  NONE  SPECIMEN:  No Specimen  DISPOSITION OF SPECIMEN:  N/A  COUNTS:  YES  TOURNIQUET:  * No tourniquets in log *  DICTATION: .Dragon Dictation  PLAN OF CARE: Admit to inpatient   PATIENT DISPOSITION:  PACU - hemodynamically stable.   Delay start of Pharmacological VTE agent (>24hrs) due to surgical blood loss or risk of bleeding: no

## 2017-07-13 NOTE — Op Note (Signed)
EGD was performed for posthemorrhagic anemia as patient presented with dark maroon stools and hemoglobin around 5.  Esophagus appeared unremarkable, 2 cm hiatal hernia and regular Z line was noted. Few dispersed diminutive nonbleeding erosions found in gastric body. Unremarkable retroflexion. Localized erythema noted in prepyloric region along with a few diffuse erosions with minimal bleeding on contact found in duodenal bulb first and second portion of the duodenum.  No obvious signs of recent or active bleeding noted. Recommend: Clear liquid diet today. Colonoscopy in a.m. Tomorrow.  Ronnette Juniper, M.D.

## 2017-07-13 NOTE — Transfer of Care (Signed)
Immediate Anesthesia Transfer of Care Note  Patient: Kerith Sherley  Procedure(s) Performed: ESOPHAGOGASTRODUODENOSCOPY (EGD) (N/A )  Patient Location: PACU and Endoscopy Unit  Anesthesia Type:MAC  Level of Consciousness: awake, alert , oriented and patient cooperative  Airway & Oxygen Therapy: Patient Spontanous Breathing and Patient connected to nasal cannula oxygen  Post-op Assessment: Report given to RN, Post -op Vital signs reviewed and stable and Patient moving all extremities  Post vital signs: Reviewed and stable  Last Vitals:  Vitals:   07/13/17 0506 07/13/17 0845  BP: 118/76 (!) 145/84  Pulse: 86 90  Resp: 16 17  Temp: 36.4 C 36.5 C  SpO2: 99% 98%    Last Pain:  Vitals:   07/13/17 0506  TempSrc: Oral  PainSc:          Complications: No apparent anesthesia complications

## 2017-07-13 NOTE — Progress Notes (Signed)
Patient ID: Madison Stokes, female   DOB: 04/23/1946, 72 y.o.   MRN: [id number]  PROGRESS NOTE    Madison Stokes  WFU:[id number] DOB: January 04, 1946 DOA: 07/12/2017  PCP: [name], MD   Brief Narrative:  72 y.o. female with past medical history relevant for bilateral breast cancer currently undergoing chemotherapy with last chemo session on 07/05/2017 as well as history of diabetes and hypertension presents to the ED with concerns about dyspnea on exertion, dizziness palpitations, fatigue and just feeling unwell. Starting on 07/09/2017 patient has had intermittent dark to maroon stools, no abdominal pain, last colonoscopy was about 5 years ago and apparently it was unremarkable, she does have hemorrhoids. In ED, Patient was found to have a hemoglobin of 5.7, hemoglobin on 05/31/2017 was 14.6, white count was elevated and platelets were not low. Denies NSAID use, does take Decadron with the chemo sessions, no prior history of GI blood loss.   has been on 81 mg of aspirin for a long time.   Assessment & Plan:   Acute GI Bleed / Acute blood loss anemia - GI to do EGD today - Monitor daily CBC - Continue protonix 40 gm IV Q 12 hours   Leukocytosis - Due to steroids, chemo but also she is hypothermic so cant exclude infection in this immunocompromised pt - Will check UA, no cough so will hold off on CXR - CBC showed WBC count on admission 18, pt not started on abx - Will start empiric rocephin   Diabetes mellitus without complications - Continue SSI  Hypertension - Stable   Breast cancer  - Last chemo session 07/05/2017 - Follows  with Dr. Burr Medico   DVT prophylaxis: SCD's Code Status: full code  Family Communication: husband at the bedside  Disposition Plan: egd today   Consultants:   GI  Procedures:   Plan for EGD  Antimicrobials:   None    Subjective: No overnight events.  Objective: Vitals:   07/13/17 0943 07/13/17 0950 07/13/17 1000 07/13/17 1326  BP: 112/80  120/69 137/81 129/79  Pulse: 100 92 84 80  Resp: 12 15 12 14   Temp: (!) 97.5 F (36.4 C)   (!) 97.5 F (36.4 C)  TempSrc: Oral   Oral  SpO2: 98% 98% 99% 98%  Weight:      Height:        Intake/Output Summary (Last 24 hours) at 07/13/2017 1350 Last data filed at 07/13/2017 0943 Gross per 24 hour  Intake 1256.66 ml  Output -  Net 1256.66 ml   Filed Weights   07/12/17 1202 07/12/17 1700  Weight: 55.3 kg (122 lb) 52.9 kg (116 lb 10 oz)    Examination:  General exam: Appears calm and comfortable  Respiratory system: Clear to auscultation. Respiratory effort normal. Cardiovascular system: S1 & S2 heard, RRR.  Gastrointestinal system: Abdomen is nondistended, soft and nontender. No organomegaly or masses felt. Normal bowel sounds heard. Central nervous system: Alert and oriented. No focal neurological deficits. Extremities: Symmetric 5 x 5 power. Skin: No rashes, lesions or ulcers Psychiatry: Judgement and insight appear normal. Mood & affect appropriate.   Data Reviewed: I have personally reviewed following labs and imaging studies  CBC: Recent Labs  Lab 07/12/17 0902 07/13/17 0054 07/13/17 0650  WBC 18.1*  --  23.8*  NEUTROABS 15.1*  --   --   HGB  --  8.4* 9.4*  9.5*  HCT 17.4* 24.3* 27.6*  27.9*  MCV 96.1  --  92.9  PLT 242  --  326   Basic Metabolic Panel: Recent Labs  Lab 07/12/17 0902 07/13/17 0650  NA 136 138  K 3.9 3.8  CL 103 107  CO2 25 25  GLUCOSE 115 90  BUN 14 10  CREATININE  --  0.75  CALCIUM 8.1* 8.2*   GFR: Estimated Creatinine Clearance: 52.5 mL/min (by C-G formula based on SCr of 0.75 mg/dL). Liver Function Tests: Recent Labs  Lab 07/12/17 0902  AST 20  ALT 28  ALKPHOS 107  BILITOT <0.2*  PROT 5.2*  ALBUMIN 3.0*   No results for input(s): LIPASE, AMYLASE in the last 168 hours. No results for input(s): AMMONIA in the last 168 hours. Coagulation Profile: Recent Labs  Lab 07/12/17 0902  INR 1.09   Cardiac Enzymes: No  results for input(s): CKTOTAL, CKMB, CKMBINDEX, TROPONINI in the last 168 hours. BNP (last 3 results) No results for input(s): PROBNP in the last 8760 hours. HbA1C: No results for input(s): HGBA1C in the last 72 hours. CBG: Recent Labs  Lab 07/12/17 1731 07/12/17 2008 07/13/17 0747 07/13/17 1129  GLUCAP 104* 113* 92 89   Lipid Profile: No results for input(s): CHOL, HDL, LDLCALC, TRIG, CHOLHDL, LDLDIRECT in the last 72 hours. Thyroid Function Tests: No results for input(s): TSH, T4TOTAL, FREET4, T3FREE, THYROIDAB in the last 72 hours. Anemia Panel: No results for input(s): VITAMINB12, FOLATE, FERRITIN, TIBC, IRON, RETICCTPCT in the last 72 hours. Urine analysis: No results found for: COLORURINE, APPEARANCEUR, LABSPEC, PHURINE, GLUCOSEU, HGBUR, BILIRUBINUR, KETONESUR, PROTEINUR, UROBILINOGEN, NITRITE, LEUKOCYTESUR Sepsis Labs: @LABRCNTIP (procalcitonin:4,lacticidven:4)   )No results found for this or any previous visit (from the past 240 hour(s)).    Radiology Studies: No results found.      Scheduled Meds: . insulin aspart  0-5 Units Subcutaneous BID WC  . multivitamin with minerals  1 tablet Oral Daily  . [START ON 07/14/2017] pantoprazole (PROTONIX) IV  40 mg Intravenous Q24H  . polyethylene glycol-electrolytes  4,000 mL Oral Once  . simvastatin  40 mg Oral QHS  . sodium chloride flush  3 mL Intravenous Q12H   Continuous Infusions: . sodium chloride    . sodium chloride 50 mL/hr at 07/12/17 1734  . sodium chloride       LOS: 1 day    Time spent: 25 minutes  Greater than 50% of the time spent on counseling and coordinating the care.   Leisa Lenz, MD [facility] Pager [phone]  If 7PM-7AM, please contact night-coverage [email or web address] Password [email or web address] 07/13/2017, 1:50 PM

## 2017-07-13 NOTE — Anesthesia Preprocedure Evaluation (Signed)
Anesthesia Evaluation  Patient identified by MRN, date of birth, ID band Patient awake    Reviewed: Allergy & Precautions, NPO status , Patient's Chart, lab work & pertinent test results  Airway Mallampati: II  TM Distance: >3 FB Neck ROM: Full    Dental no notable dental hx.    Pulmonary neg pulmonary ROS,    Pulmonary exam normal breath sounds clear to auscultation       Cardiovascular hypertension, Normal cardiovascular exam Rhythm:Regular Rate:Normal     Neuro/Psych negative neurological ROS  negative psych ROS   GI/Hepatic negative GI ROS, Neg liver ROS,   Endo/Other  diabetes  Renal/GU negative Renal ROS  negative genitourinary   Musculoskeletal negative musculoskeletal ROS (+)   Abdominal   Peds negative pediatric ROS (+)  Hematology  (+) anemia ,   Anesthesia Other Findings   Reproductive/Obstetrics negative OB ROS                             Anesthesia Physical Anesthesia Plan  ASA: III  Anesthesia Plan: MAC   Post-op Pain Management:    Induction: Intravenous  PONV Risk Score and Plan:   Airway Management Planned: Nasal Cannula  Additional Equipment:   Intra-op Plan:   Post-operative Plan:   Informed Consent: I have reviewed the patients History and Physical, chart, labs and discussed the procedure including the risks, benefits and alternatives for the proposed anesthesia with the patient or authorized representative who has indicated his/her understanding and acceptance.   Dental advisory given  Plan Discussed with: CRNA and Surgeon  Anesthesia Plan Comments:         Anesthesia Quick Evaluation

## 2017-07-13 NOTE — Interval H&P Note (Signed)
History and Physical Interval Note: 71/female with dark maroon stools and anemia suspicious for GI bleeding for an EGD. She was on ASA 81 mg not other NSAIDs.  07/13/2017 9:12 AM  Madison Stokes  has presented today for EGD, with the diagnosis of GI bleed  The various methods of treatment have been discussed with the patient and family. After consideration of risks, benefits and other options for treatment, the patient has consented to  Procedure(s): ESOPHAGOGASTRODUODENOSCOPY (EGD) (N/A) as a surgical intervention .  The patient's history has been reviewed, patient examined, no change in status, stable for surgery.  I have reviewed the patient's chart and labs.  Questions were answered to the patient's satisfaction.     Ronnette Juniper

## 2017-07-13 NOTE — Op Note (Signed)
[facility] Patient Name: Madison Stokes Procedure Date: 07/13/2017 MRN: [id number] Attending MD: Ronnette Juniper , MD Date of Birth: October 04, 1945 CSN: [id number] Age: 72 Admit Type: Inpatient Procedure:                Upper GI endoscopy Indications:              Acute post hemorrhagic anemia, Suspected upper                            gastrointestinal bleeding Providers:                Ronnette Juniper, MD, Burtis Junes, RN, Cherylynn Ridges,                            Technician Referring MD:              Medicines:                Monitored Anesthesia Care Complications:            No immediate complications. Estimated Blood Loss:     Estimated blood loss: none. Procedure:                Pre-Anesthesia Assessment:                           - Prior to the procedure, a History and Physical                            was performed, and patient medications and                            allergies were reviewed. The patient's tolerance of                            previous anesthesia was also reviewed. The risks                            and benefits of the procedure and the sedation                            options and risks were discussed with the patient.                            All questions were answered, and informed consent                            was obtained. Prior Anticoagulants: The patient has                            taken aspirin, last dose was 4 days prior to                            procedure. ASA Grade Assessment: III - A patient  with severe systemic disease. After reviewing the                            risks and benefits, the patient was deemed in                            satisfactory condition to undergo the procedure.                           After obtaining informed consent, the endoscope was                            passed under direct vision. Throughout the                            procedure, the patient's blood  pressure, pulse, and                            oxygen saturations were monitored continuously. The                            EG-[id number] ([id number]) scope was introduced through the                            mouth, and advanced to the second part of duodenum.                            The upper GI endoscopy was accomplished without                            difficulty. The patient tolerated the procedure                            well. Scope In: Scope Out: Findings:      The examined esophagus was normal.      The Z-line was regular and was found 36 cm from the incisors.      A 2 cm hiatal hernia was present.      A few dispersed, diminutive non-bleeding erosions were found in the       gastric body. There were no stigmata of recent bleeding.      The cardia and gastric fundus were normal on retroflexion.      Localized mildly erythematous mucosa without bleeding was found in the       prepyloric region of the stomach.      A few diffuse erosions with minimal bleeding on contact were found in       the duodenal bulb, in the first portion of the duodenum and in the       second portion of the duodenum.      Localized mildly erythematous mucosa was found in the duodenal bulb and       in the first portion of the duodenum. Impression:               - Normal esophagus.                           -  Z-line regular, 36 cm from the incisors.                           - 2 cm hiatal hernia.                           - Non-bleeding erosive gastropathy.                           - Erythematous mucosa in the prepyloric region of                            the stomach.                           - Duodenal erosions without bleeding.                           - No specimens collected. Moderate Sedation:      Patient did not receive moderate sedation for this procedure, but       instead received monitored anesthesia care. Recommendation:           - Clear liquid diet.                           -  Perform a colonoscopy tomorrow.                           - Continue present medications.                           - Return patient to hospital ward for ongoing care. Procedure Code(s):        --- Professional ---                           [phone], Esophagogastroduodenoscopy, flexible,                            transoral; diagnostic, including collection of                            specimen(s) by brushing or washing, when performed                            (separate procedure) Diagnosis Code(s):        --- Professional ---                           K44.9, Diaphragmatic hernia without obstruction or                            gangrene                           K31.89, Other diseases of stomach and duodenum                           K26.9,  Duodenal ulcer, unspecified as acute or                            chronic, without hemorrhage or perforation                           D62, Acute posthemorrhagic anemia CPT copyright 2016 [employer]. All rights reserved. The codes documented in this report are preliminary and upon coder review may  be revised to meet current compliance requirements. Ronnette Juniper, MD 07/13/2017 9:45:31 AM This report has been signed electronically. Number of Addenda: 0

## 2017-07-14 ENCOUNTER — Encounter (HOSPITAL_COMMUNITY)
Admission: EM | Disposition: A | Discharge status: Home / Self Care | Payer: Self-pay | Source: Home / Self Care | Attending: Internal Medicine

## 2017-07-14 ENCOUNTER — Inpatient Hospital Stay (HOSPITAL_COMMUNITY): Payer: Medicare Other | Admitting: Certified Registered"

## 2017-07-14 ENCOUNTER — Encounter (HOSPITAL_COMMUNITY): Payer: Self-pay | Admitting: *Deleted

## 2017-07-14 HISTORY — PX: COLONOSCOPY WITH PROPOFOL: SHX5780

## 2017-07-14 LAB — CBC
HCT: 27.5 % — ABNORMAL LOW (ref 36.0–46.0)
Hemoglobin: 9 g/dL — ABNORMAL LOW (ref 12.0–15.0)
MCH: 30.9 pg (ref 26.0–34.0)
MCHC: 32.7 g/dL (ref 30.0–36.0)
MCV: 94.5 fL (ref 78.0–100.0)
Platelets: 238 10*3/uL (ref 150–400)
RBC: 2.91 MIL/uL — ABNORMAL LOW (ref 3.87–5.11)
RDW: 17.1 % — ABNORMAL HIGH (ref 11.5–15.5)
WBC: 17.1 10*3/uL — ABNORMAL HIGH (ref 4.0–10.5)

## 2017-07-14 LAB — GLUCOSE, CAPILLARY
Glucose-Capillary: 96 mg/dL (ref 65–99)
Glucose-Capillary: 84 mg/dL (ref 65–99)
Glucose-Capillary: 74 mg/dL (ref 65–99)

## 2017-07-14 LAB — BASIC METABOLIC PANEL
Anion gap: 7 (ref 5–15)
BUN: 7 mg/dL (ref 6–20)
CO2: 25 mmol/L (ref 22–32)
Calcium: 8.2 mg/dL — ABNORMAL LOW (ref 8.9–10.3)
Chloride: 106 mmol/L (ref 101–111)
Creatinine, Ser: 0.74 mg/dL (ref 0.44–1.00)
GFR calc Af Amer: >60 mL/min (ref >60)
GFR calc non Af Amer: >60 mL/min (ref >60)
Glucose, Bld: 94 mg/dL (ref 65–99)
Potassium: 3.3 mmol/L — ABNORMAL LOW (ref 3.5–5.1)
Sodium: 138 mmol/L (ref 135–145)

## 2017-07-14 SURGERY — COLONOSCOPY WITH PROPOFOL
Anesthesia: Monitor Anesthesia Care

## 2017-07-14 SURGERY — COLONOSCOPY WITH PROPOFOL
Anesthesia: Monitor Anesthesia Care | Laterality: Left

## 2017-07-14 MED ORDER — HEPARIN SOD (PORK) LOCK FLUSH 100 UNIT/ML IV SOLN
500.0000 [IU] | INTRAVENOUS | Status: AC | PRN
  Administered 2017-07-14: 500 [IU]

## 2017-07-14 MED ORDER — PANTOPRAZOLE SODIUM 40 MG PO TBEC: 40.0000 mg | DELAYED_RELEASE_TABLET | Freq: Every day | ORAL | 1 refills | Status: DC

## 2017-07-14 MED ORDER — POTASSIUM CHLORIDE ER 10 MEQ PO TBCR: 10.0000 meq | EXTENDED_RELEASE_TABLET | Freq: Every day | ORAL | 0 refills | Status: DC

## 2017-07-14 MED ORDER — PHENYLEPHRINE 40 MCG/ML (10ML) SYRINGE FOR IV PUSH (FOR BLOOD PRESSURE SUPPORT)
PREFILLED_SYRINGE | INTRAVENOUS | Status: DC | PRN
  Administered 2017-07-14: 120 ug via INTRAVENOUS

## 2017-07-14 MED ORDER — PROPOFOL 10 MG/ML IV BOLUS
INTRAVENOUS | Status: AC
  Filled 2017-07-14: qty 40

## 2017-07-14 MED ORDER — PROPOFOL 10 MG/ML IV BOLUS
INTRAVENOUS | Status: DC | PRN
  Administered 2017-07-14 (×3): 20 mg via INTRAVENOUS
  Administered 2017-07-14: 40 mg via INTRAVENOUS
  Administered 2017-07-14 (×4): 20 mg via INTRAVENOUS
  Administered 2017-07-14: 50 mg via INTRAVENOUS

## 2017-07-14 MED ORDER — LIDOCAINE 2% (20 MG/ML) 5 ML SYRINGE
INTRAMUSCULAR | Status: DC | PRN
  Administered 2017-07-14: 40 mg via INTRAVENOUS

## 2017-07-14 NOTE — Brief Op Note (Signed)
07/12/2017 - 07/14/2017  8:08 AM  PATIENT:  Madison Stokes  72 y.o. female  PRE-OPERATIVE DIAGNOSIS:  anemia, gi bleed  POST-OPERATIVE DIAGNOSIS:  normal colon  PROCEDURE:  Procedure(s): COLONOSCOPY WITH PROPOFOL (Left)  SURGEON:  Surgeon(s) and Role:    Ronnette Juniper, MD - Primary  PHYSICIAN ASSISTANT:   ASSISTANTS:Jennifer Kappus, RN, Janie Billups, Tech  ANESTHESIA:   MAC  EBL:  0 mL   BLOOD ADMINISTERED:none  DRAINS: none   LOCAL MEDICATIONS USED:  NONE  SPECIMEN:  No Specimen  DISPOSITION OF SPECIMEN:  N/A  COUNTS:  YES  TOURNIQUET:  * No tourniquets in log *  DICTATION: .Dragon Dictation  PLAN OF CARE: Admit to inpatient   PATIENT DISPOSITION:  PACU - hemodynamically stable.   Delay start of Pharmacological VTE agent (>24hrs) due to surgical blood loss or risk of bleeding: no

## 2017-07-14 NOTE — Anesthesia Preprocedure Evaluation (Addendum)
Anesthesia Evaluation  Patient identified by MRN, date of birth, ID band Patient awake  General Assessment Comment:History noted. CG  Reviewed: Allergy & Precautions, NPO status , Patient's Chart, lab work & pertinent test results  Airway Mallampati: II  TM Distance: >3 FB     Dental  (+) Dental Advisory Given   Pulmonary neg pulmonary ROS,    breath sounds clear to auscultation       Cardiovascular hypertension,  Rhythm:Regular Rate:Normal     Neuro/Psych    GI/Hepatic Neg liver ROS, History noted. CG   Endo/Other  diabetes  Renal/GU negative Renal ROS     Musculoskeletal   Abdominal   Peds  Hematology  (+) anemia ,   Anesthesia Other Findings   Reproductive/Obstetrics                            Anesthesia Physical Anesthesia Plan  ASA: III  Anesthesia Plan: MAC   Post-op Pain Management:    Induction: Intravenous  PONV Risk Score and Plan: 2 and Treatment may vary due to age or medical condition, Ondansetron, Midazolam and Propofol infusion  Airway Management Planned: Simple Face Mask and Nasal Cannula  Additional Equipment:   Intra-op Plan:   Post-operative Plan:   Informed Consent: I have reviewed the patients History and Physical, chart, labs and discussed the procedure including the risks, benefits and alternatives for the proposed anesthesia with the patient or authorized representative who has indicated his/her understanding and acceptance.     Plan Discussed with: CRNA and Anesthesiologist  Anesthesia Plan Comments:        Anesthesia Quick Evaluation

## 2017-07-14 NOTE — Progress Notes (Signed)
Madison Stokes to be D/C'd Home per MD order.  Discussed prescriptions and follow up appointments with the patient. Prescriptions given to patient, medication list explained in detail. Pt verbalized understanding.  Allergies as of 07/14/2017   No Known Allergies     Medication List    STOP taking these medications   omeprazole 20 MG tablet Commonly known as:  PRILOSEC OTC     TAKE these medications   aspirin EC 81 MG tablet Take 81 mg by mouth daily.   dexamethasone 4 MG tablet Commonly known as:  DECADRON Take 2 tablets (8 mg total) 2 (two) times daily by mouth. Start the day before Taxotere. Then again the day after chemo for 3 days. What changed:    how much to take  additional instructions   irbesartan 150 MG tablet Commonly known as:  AVAPRO Take 150 mg by mouth daily.   lidocaine-prilocaine cream Commonly known as:  EMLA Apply to affected area once   loperamide 2 MG capsule Commonly known as:  IMODIUM Take by mouth as needed for diarrhea or loose stools.   loratadine 10 MG tablet Commonly known as:  CLARITIN Take 10 mg by mouth daily as needed for allergies.   metFORMIN 500 MG tablet Commonly known as:  GLUCOPHAGE Take 1 tablet (500 mg total) by mouth 2 (two) times daily with a meal. What changed:    when to take this  reasons to take this   multivitamin tablet Take 1 tablet daily by mouth.   ondansetron 8 MG disintegrating tablet Commonly known as:  ZOFRAN ODT Take 1 tablet (8 mg total) by mouth every 8 (eight) hours as needed for nausea or vomiting. Start on Day 3 post chemo   pantoprazole 40 MG tablet Commonly known as:  PROTONIX Take 1 tablet (40 mg total) by mouth daily.   potassium chloride 10 MEQ tablet Commonly known as:  K-DUR Take 1 tablet (10 mEq total) by mouth daily.   prochlorperazine 10 MG tablet Commonly known as:  COMPAZINE Take 1 tablet (10 mg total) every 6 (six) hours as needed by mouth (Nausea or vomiting).   simvastatin  40 MG tablet Commonly known as:  ZOCOR Take 40 mg at bedtime by mouth.       Vitals:   07/14/17 0820 07/14/17 0830  BP: (!) 98/58 109/71  Pulse: 88 88  Resp: 15 13  Temp:    SpO2: 98% 98%    Skin clean, dry and intact without evidence of skin break down, no evidence of skin tears noted. IV catheter discontinued intact. Site without signs and symptoms of complications. Dressing and pressure applied. Pt denies pain at this time. No complaints noted.  An After Visit Summary was printed and given to the patient. Patient escorted via [place], and D/C home via private auto.  Haywood Lasso BSN, RN [employer] Phone [phone]

## 2017-07-14 NOTE — Anesthesia Procedure Notes (Signed)
Procedure Name: MAC Date/Time: 07/14/2017 7:40 AM Performed by: Cynda Familia, CRNA Pre-anesthesia Checklist: Patient identified, Emergency Drugs available, Suction available, Patient being monitored and Timeout performed Patient Re-evaluated:Patient Re-evaluated prior to induction Oxygen Delivery Method: Simple face mask Placement Confirmation: positive ETCO2 and breath sounds checked- equal and bilateral Dental Injury: Teeth and Oropharynx as per pre-operative assessment

## 2017-07-14 NOTE — Discharge Summary (Signed)
Physician Discharge Summary  Madison Stokes [id number] DOB: May 14, 1946 DOA: 07/12/2017  PCP: [name], MD  Admit date: 07/12/2017 Discharge date: 07/14/2017  Recommendations for Outpatient Follow-up:  Continue protonix daily and may resume aspirin daily Follow up with GI per scheduled appointment Continue potassium supplementation for 5 days on discharge  Discharge Diagnoses:  Principal Problem:   Acute GI bleed Active Problems:   Cancer of overlapping sites of right female breast ([place])   Cancer of central portion of left breast ([place])   Acute blood loss anemia   HTN (hypertension)   Type 2 diabetes mellitus without complication ([place])    Discharge Condition: stable   Diet recommendation: as tolerated   History of present illness:  72 y.o.femalewith past medical history relevant for bilateral breast cancer currently undergoing chemotherapy with last chemo session on 07/05/2017 as well as history of diabetesandhypertension presents to the ED with concerns about dyspnea on exertion, dizziness palpitations, fatigue and just feeling unwell. Starting on 07/09/2017 patient has had intermittent dark to maroon stools,no abdominal pain, lastcolonoscopy was about 5 years ago and apparently it was unremarkable,she does have hemorrhoids. In ED, Patient was found to have a hemoglobin of 5.7, hemoglobin on 05/31/2017 was 14.6,white count was elevated and platelets were not low. Denies NSAID use,does take Decadron with the chemo sessions,no prior history of GI blood loss.has been on 81 mg of aspirin for a long time.   Hospital Course:   Acute GI Bleed / Acute blood loss anemia - EGD without active bleed - Protonix 40 mg daily per GI, capsule endo on outpt basis - Aspirin may be resumed per GI  Leukocytosis - Due to steroids, chemo but also she is hypothermic so cant exclude infection in this immunocompromised pt - UA not collected - WBC count down to 17 this am - No  fever, no cough, will hold off on abx on discharge for now   Diabetes mellitus without complications - Continue home meds   Hypertension - Stable   Breast cancer  - Lastchemo session 07/05/2017 - Follows  with Dr. Burr Medico   DVT prophylaxis: SCD's Code Status: full code  Family Communication: husband at bedside    Consultants:   GI  Procedures:   EGD 07/12/2016  Antimicrobials:   None     Signed:  Leisa Lenz, MD  [facility] 07/14/2017, 9:25 AM  Pager #: [phone]  Time spent in minutes: more than 30 minutes  Discharge Exam: Vitals:   07/14/17 0820 07/14/17 0830  BP: (!) 98/58 109/71  Pulse: 88 88  Resp: 15 13  Temp:    SpO2: 98% 98%   Vitals:   07/14/17 0811 07/14/17 0815 07/14/17 0820 07/14/17 0830  BP: (!) 85/54 99/67 (!) 98/58 109/71  Pulse: 89 82 88 88  Resp: 15 15 15 13   Temp: 97.8 F (36.6 C)     TempSrc: Oral     SpO2: 100% 100% 98% 98%  Weight:      Height:        General: Pt is alert, follows commands appropriately, not in acute distress Cardiovascular: Regular rate and rhythm, S1/S2 + Respiratory: Clear to auscultation bilaterally, no wheezing, no crackles, no rhonchi Abdominal: Soft, non tender, non distended, bowel sounds +, no guarding Extremities: no edema, no cyanosis, pulses palpable bilaterally DP and PT Neuro: Grossly nonfocal  Discharge Instructions  Discharge Instructions    Call MD for:  persistant nausea and vomiting   Complete by:  As directed    Call  MD for:  redness, tenderness, or signs of infection (pain, swelling, redness, odor or green/yellow discharge around incision site)   Complete by:  As directed    Call MD for:  severe uncontrolled pain   Complete by:  As directed    Diet - low sodium heart healthy   Complete by:  As directed    Discharge instructions   Complete by:  As directed    Continue protonix daily and may resume aspirin daily Follow up with GI per scheduled appointment    Increase activity slowly   Complete by:  As directed      Allergies as of 07/14/2017   No Known Allergies     Medication List    STOP taking these medications   omeprazole 20 MG tablet Commonly known as:  PRILOSEC OTC     TAKE these medications   aspirin EC 81 MG tablet Take 81 mg by mouth daily.   dexamethasone 4 MG tablet Commonly known as:  DECADRON Take 2 tablets (8 mg total) 2 (two) times daily by mouth. Start the day before Taxotere. Then again the day after chemo for 3 days. What changed:    how much to take  additional instructions   irbesartan 150 MG tablet Commonly known as:  AVAPRO Take 150 mg by mouth daily.   lidocaine-prilocaine cream Commonly known as:  EMLA Apply to affected area once   loperamide 2 MG capsule Commonly known as:  IMODIUM Take by mouth as needed for diarrhea or loose stools.   loratadine 10 MG tablet Commonly known as:  CLARITIN Take 10 mg by mouth daily as needed for allergies.   metFORMIN 500 MG tablet Commonly known as:  GLUCOPHAGE Take 1 tablet (500 mg total) by mouth 2 (two) times daily with a meal. What changed:    when to take this  reasons to take this   multivitamin tablet Take 1 tablet daily by mouth.   ondansetron 8 MG disintegrating tablet Commonly known as:  ZOFRAN ODT Take 1 tablet (8 mg total) by mouth every 8 (eight) hours as needed for nausea or vomiting. Start on Day 3 post chemo   pantoprazole 40 MG tablet Commonly known as:  PROTONIX Take 1 tablet (40 mg total) by mouth daily.   potassium chloride 10 MEQ tablet Commonly known as:  K-DUR Take 1 tablet (10 mEq total) by mouth daily.   prochlorperazine 10 MG tablet Commonly known as:  COMPAZINE Take 1 tablet (10 mg total) every 6 (six) hours as needed by mouth (Nausea or vomiting).   simvastatin 40 MG tablet Commonly known as:  ZOCOR Take 40 mg at bedtime by mouth.       Follow-up Information    [name], MD. Schedule an appointment  as soon as possible for a visit.   Specialty:  Family Medicine Contact information: [address] [place] [address] [phone]        Ronnette Juniper, MD. Schedule an appointment as soon as possible for a visit.   Specialty:  Gastroenterology Contact information: [place] [address] [phone]            The results of significant diagnostics from this hospitalization (including imaging, microbiology, ancillary and laboratory) are listed below for reference.    Significant Diagnostic Studies: No results found.  Microbiology: No results found for this or any previous visit (from the past 240 hour(s)).   Labs: Basic Metabolic Panel: Recent Labs  Lab 07/12/17 0902 07/13/17 0650 07/14/17 0627  NA 136 138 138  K 3.9 3.8 3.3*  CL 103 107 106  CO2 25 25 25   GLUCOSE 115 90 94  BUN 14 10 7   CREATININE  --  0.75 0.74  CALCIUM 8.1* 8.2* 8.2*   Liver Function Tests: Recent Labs  Lab 07/12/17 0902  AST 20  ALT 28  ALKPHOS 107  BILITOT <0.2*  PROT 5.2*  ALBUMIN 3.0*   No results for input(s): LIPASE, AMYLASE in the last 168 hours. No results for input(s): AMMONIA in the last 168 hours. CBC: Recent Labs  Lab 07/12/17 0902 07/13/17 0054 07/13/17 0650 07/14/17 0627  WBC 18.1*  --  23.8* 17.1*  NEUTROABS 15.1*  --   --   --   HGB  --  8.4* 9.4*  9.5* 9.0*  HCT 17.4* 24.3* 27.6*  27.9* 27.5*  MCV 96.1  --  92.9 94.5  PLT 242  --  221 238   Cardiac Enzymes: No results for input(s): CKTOTAL, CKMB, CKMBINDEX, TROPONINI in the last 168 hours. BNP: BNP (last 3 results) No results for input(s): BNP in the last 8760 hours.  ProBNP (last 3 results) No results for input(s): PROBNP in the last 8760 hours.  CBG: Recent Labs  Lab 07/13/17 1129 07/13/17 1723 07/13/17 2013 07/14/17 0713 07/14/17 0920  GLUCAP 89 98 96 84 74

## 2017-07-14 NOTE — Discharge Instructions (Signed)
Pantoprazole tablets What is this medicine? PANTOPRAZOLE (pan TOE pra zole) prevents the production of acid in the stomach. It is used to treat gastroesophageal reflux disease (GERD), inflammation of the esophagus, and Zollinger-Ellison syndrome. This medicine may be used for other purposes; ask your health care provider or pharmacist if you have questions. COMMON BRAND NAME(S): Protonix What should I tell my health care provider before I take this medicine? They need to know if you have any of these conditions: -liver disease -low levels of magnesium in the blood -lupus -an unusual or allergic reaction to omeprazole, lansoprazole, pantoprazole, rabeprazole, other medicines, foods, dyes, or preservatives -pregnant or trying to get pregnant -breast-feeding How should I use this medicine? Take this medicine by mouth. Swallow the tablets whole with a drink of water. Follow the directions on the prescription label. Do not crush, break, or chew. Take your medicine at regular intervals. Do not take your medicine more often than directed. Talk to your pediatrician regarding the use of this medicine in children. While this drug may be prescribed for children as young as 5 years for selected conditions, precautions do apply. Overdosage: If you think you have taken too much of this medicine contact a poison control center or emergency room at once. NOTE: This medicine is only for you. Do not share this medicine with others. What if I miss a dose? If you miss a dose, take it as soon as you can. If it is almost time for your next dose, take only that dose. Do not take double or extra doses. What may interact with this medicine? Do not take this medicine with any of the following medications: -atazanavir -nelfinavir This medicine may also interact with the following medications: -ampicillin -delavirdine -erlotinib -iron salts -medicines for fungal infections like ketoconazole, itraconazole and  voriconazole -methotrexate -mycophenolate mofetil -warfarin This list may not describe all possible interactions. Give your health care provider a list of all the medicines, herbs, non-prescription drugs, or dietary supplements you use. Also tell them if you smoke, drink alcohol, or use illegal drugs. Some items may interact with your medicine. What should I watch for while using this medicine? It can take several days before your stomach pain gets better. Check with your doctor or health care professional if your condition does not start to get better, or if it gets worse. You may need blood work done while you are taking this medicine. What side effects may I notice from receiving this medicine? Side effects that you should report to your doctor or health care professional as soon as possible: -allergic reactions like skin rash, itching or hives, swelling of the face, lips, or tongue -bone, muscle or joint pain -breathing problems -chest pain or chest tightness -dark yellow or brown urine -dizziness -fast, irregular heartbeat -feeling faint or lightheaded -fever or sore throat -muscle spasm -palpitations -rash on cheeks or arms that gets worse in the sun -redness, blistering, peeling or loosening of the skin, including inside the mouth -seizures -tremors -unusual bleeding or bruising -unusually weak or tired -yellowing of the eyes or skin Side effects that usually do not require medical attention (report to your doctor or health care professional if they continue or are bothersome): -constipation -diarrhea -dry mouth -headache -nausea This list may not describe all possible side effects. Call your doctor for medical advice about side effects. You may report side effects to FDA at 1-800-FDA-1088. Where should I keep my medicine? Keep out of the reach of children. Store at room   temperature between 15 and 30 degrees C (59 and 86 degrees F). Protect from light and moisture. Throw  away any unused medicine after the expiration date. NOTE: This sheet is a summary. It may not cover all possible information. If you have questions about this medicine, talk to your doctor, pharmacist, or health care provider.  2018 Elsevier/Gold Standard (2015-07-14 12:20:19)  

## 2017-07-14 NOTE — Interval H&P Note (Signed)
History and Physical Interval Note:  71/female with maroon stools and anemia requiring transfusion, had an EGD yesterday(unrevealing for cause of GI bleed), scheduled for a colonoscopy today.  07/14/2017 7:38 AM  Madison Stokes  has presented today for colonoscopy, with the diagnosis of anemia, gi bleed  The various methods of treatment have been discussed with the patient and family. After consideration of risks, benefits and other options for treatment, the patient has consented to  Procedure(s): COLONOSCOPY WITH PROPOFOL (Left) as a surgical intervention .  The patient's history has been reviewed, patient examined, no change in status, stable for surgery.  I have reviewed the patient's chart and labs.  Questions were answered to the patient's satisfaction.     Ronnette Juniper

## 2017-07-14 NOTE — Op Note (Signed)
Colonoscopy was performed as endoscopy performed yesterday was unrevealing for source of GI bleed.  The entire colon appeared unremarkable. Terminal ileum appeared unremarkable. Retroflexion was normal.   Recommendation: As patient has remained hemodynamically stable and there was no further drop in hemoglobin after transfusion(light yellow stool noted throughout the colon), okay to start patient on a regular diet.  Will arrange for a small bowel pill cam study to be done as an outpatient. Patient needs to be on a PPI daily and she also takes aspirin 81 mg which can be restarted. Okay to discharge patient from GI standpoint.  Ronnette Juniper, M.D.

## 2017-07-14 NOTE — Anesthesia Postprocedure Evaluation (Signed)
Anesthesia Post Note  Patient: Madison Stokes  Procedure(s) Performed: COLONOSCOPY WITH PROPOFOL (Left )     Patient location during evaluation: Endoscopy Anesthesia Type: MAC Level of consciousness: awake Pain management: pain level controlled Vital Signs Assessment: post-procedure vital signs reviewed and stable Respiratory status: spontaneous breathing Cardiovascular status: stable Anesthetic complications: no    Last Vitals:  Vitals:   07/14/17 0815 07/14/17 0820  BP: 99/67 (!) 98/58  Pulse: 82 88  Resp: 15 15  Temp:    SpO2: 100% 98%    Last Pain:  Vitals:   07/14/17 0811  TempSrc: Oral  PainSc:                  Rabecka Brendel

## 2017-07-14 NOTE — Transfer of Care (Signed)
Immediate Anesthesia Transfer of Care Note  Patient: Madison Stokes  Procedure(s) Performed: COLONOSCOPY WITH PROPOFOL (Left )  Patient Location: PACU and Endoscopy Unit  Anesthesia Type:MAC  Level of Consciousness: sedated  Airway & Oxygen Therapy: Patient Spontanous Breathing and Patient connected to face mask oxygen  Post-op Assessment: Report given to RN and Post -op Vital signs reviewed and stable  Post vital signs: Reviewed and stable  Last Vitals:  Vitals:   07/14/17 0602 07/14/17 0720  BP: 134/82 (!) 155/91  Pulse: 84   Resp: 16 13  Temp: 36.5 C 37.1 C  SpO2: 99% 98%    Last Pain:  Vitals:   07/14/17 0720  TempSrc: Oral  PainSc:          Complications: No apparent anesthesia complications

## 2017-07-14 NOTE — Op Note (Signed)
[facility] Patient Name: Madison Stokes Procedure Date: 07/14/2017 MRN: [id number] Attending MD: Ronnette Juniper , MD Date of Birth: 01-04-1946 CSN: [id number] Age: 72 Admit Type: Inpatient Procedure:                Colonoscopy Indications:              Last colonoscopy 5 years ago, Gastrointestinal                            bleeding, obscure GI bleeding, unrevealing EGD Providers:                Ronnette Juniper, MD, Kingsley Plan, RN, Cherylynn Ridges,                            Technician, Glenis Smoker, CRNA Referring MD:              Medicines:                Monitored Anesthesia Care Complications:            No immediate complications. Estimated Blood Loss:     Estimated blood loss: none. Procedure:                Pre-Anesthesia Assessment:                           - Prior to the procedure, a History and Physical                            was performed, and patient medications and                            allergies were reviewed. The patient's tolerance of                            previous anesthesia was also reviewed. The risks                            and benefits of the procedure and the sedation                            options and risks were discussed with the patient.                            All questions were answered, and informed consent                            was obtained. Prior Anticoagulants: The patient has                            taken aspirin, last dose was 5 days prior to                            procedure. ASA Grade Assessment: III - A patient  with severe systemic disease. After reviewing the                            risks and benefits, the patient was deemed in                            satisfactory condition to undergo the procedure.                           After obtaining informed consent, the colonoscope                            was passed under direct vision. Throughout the          procedure, the patient's blood pressure, pulse, and                            oxygen saturations were monitored continuously. The                            EC-[id number] ([id number]) scope was introduced through                            the anus and advanced to the the terminal ileum.                            The colonoscopy was performed without difficulty.                            The patient tolerated the procedure well. The                            quality of the bowel preparation was good. The                            terminal ileum, the appendiceal orifice and the                            rectum were photographed. Scope In: 7:44:41 AM Scope Out: 8:01:56 AM Scope Withdrawal Time: 0 hours 11 minutes 28 seconds  Total Procedure Duration: 0 hours 17 minutes 15 seconds  Findings:      The perianal and digital rectal examinations were normal.      The terminal ileum appeared normal.      The colon (entire examined portion) appeared normal.      The retroflexed view of the distal rectum and anal verge was normal and       showed no anal or rectal abnormalities. Impression:               - The examined portion of the ileum was normal.                           - The entire examined colon is normal.                           -  The distal rectum and anal verge are normal on                            retroflexion view.                           - No specimens collected. Moderate Sedation:      Patient did not receive moderate sedation for this procedure, but       instead received monitored anesthesia care. Recommendation:           - Discharge patient to home.                           - Resume regular diet.                           - Continue present medications.                           - Repeat colonoscopy in 10 years for screening                            purposes.                           - Will arrange for a small bowel pillcam study as                             an outpatient.                           Patient needs to be on PPI once a day as long as                            ASA is continued.                           - Return patient to hospital ward for ongoing care. Procedure Code(s):        --- Professional ---                           [id number], Colonoscopy, flexible; diagnostic, including                            collection of specimen(s) by brushing or washing,                            when performed (separate procedure) Diagnosis Code(s):        --- Professional ---                           K92.2, Gastrointestinal hemorrhage, unspecified CPT copyright 2016 [employer]. All rights reserved. The codes documented in this report are preliminary and upon coder review may  be revised to meet current compliance requirements. Ronnette Juniper, MD 07/14/2017 8:07:27 AM This report has been signed electronically. Number of Addenda: 0

## 2017-07-15 ENCOUNTER — Encounter (HOSPITAL_COMMUNITY): Payer: Self-pay | Admitting: Gastroenterology

## 2017-07-19 DIAGNOSIS — R7303 Prediabetes: Secondary | ICD-10-CM | POA: Diagnosis not present

## 2017-07-19 DIAGNOSIS — C50811 Malignant neoplasm of overlapping sites of right female breast: Secondary | ICD-10-CM | POA: Diagnosis not present

## 2017-07-19 DIAGNOSIS — I10 Essential (primary) hypertension: Secondary | ICD-10-CM | POA: Diagnosis not present

## 2017-07-19 DIAGNOSIS — Z170 Estrogen receptor positive status [ER+]: Secondary | ICD-10-CM | POA: Diagnosis not present

## 2017-07-19 DIAGNOSIS — K922 Gastrointestinal hemorrhage, unspecified: Secondary | ICD-10-CM | POA: Diagnosis not present

## 2017-07-19 DIAGNOSIS — E7800 Pure hypercholesterolemia, unspecified: Secondary | ICD-10-CM | POA: Diagnosis not present

## 2017-07-23 DIAGNOSIS — D509 Iron deficiency anemia, unspecified: Secondary | ICD-10-CM | POA: Diagnosis not present

## 2017-07-23 DIAGNOSIS — D500 Iron deficiency anemia secondary to blood loss (chronic): Secondary | ICD-10-CM | POA: Diagnosis not present

## 2017-07-24 NOTE — Progress Notes (Signed)
[place]  [phone] [phone]  Clinic Follow Up Note   Patient Care Team: [name], MD as PCP - General (Family Medicine) [name], MD as Consulting Physician (Hematology) [name], NP as Nurse Practitioner (Nurse Practitioner) [name], MD as Consulting Physician (General Surgery)   Date of Service:  07/25/2017  CHIEF COMPLAINTS:  F/u bilateral breast cancer    Oncology History   Cancer Staging Cancer of central portion of left breast [facility]) Staging form: Breast, AJCC 8th Edition - Clinical stage from 05/03/2017: Stage IA (cT1a, cN0, cM0, G1, ER: Positive, PR: Positive, HER2: Negative) - Signed by [name], MD on 05/07/2017  Cancer of overlapping sites of right female breast [facility]) Staging form: Breast, AJCC 8th Edition - Clinical stage from 04/24/2017: Stage IB (cT3, cN0, cM0, G2, ER: Positive, PR: Positive, HER2: Positive) - Signed by [name], MD on 05/07/2017       Cancer of overlapping sites of right female breast ([place])   03/08/2017 Mammogram    IMPRESSION: 1. Patient has a is palpable mass in the lateral right breast, in the location dense fibroglandular breast tissue. Although no discrete mass is seen sonographically or mammographically, the Clinical change remains concerning. Biopsy is recommended.  RECOMMENDATION: 1. Ultrasound-guided core needle biopsy of palpable abnormality in the lateral right breast.      04/24/2017 Breast US    IMPRESSION: Ultrasound guided biopsy of the right breast. No apparent complications.      04/24/2017 Initial Biopsy    Breast, right, needle core biopsy, UOQ, centered at 9:30 o'clock - INVASIVE MAMMARY CARCINOMA. - SEE COMMENT. ER 100% positive PR 100% positive Ki67 10% HER2 positive      05/02/2017 Breast MRI    IMPRESSION: 1. 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 2. 4 mm  enhancing mass with associated architectural distortion in the central left breast. This is suspicious for additional focus of carcinoma.      05/07/2017 Initial Diagnosis    Cancer of overlapping sites of right female breast ([place])      05/15/2017 Echocardiogram    ECHO 05/15/17  Impressions: - LVEF 60-65%, normal wall thickness, normal wall motion and GLS   strain, grade 1 DD, normal LV filling pressure, mild MR, normal   LA size, normal IVC.         05/22/2017 Imaging    CT CAP W Contrast 05/22/17 IMPRESSION: 1. No specific CT findings of nodal or other metastatic disease in the chest, abdomen, and pelvis. 2. Bosniak category 3F cyst in the right mid kidney, with mild enhancement along a septation but without nodularity. Unless follow up abdominal scans related to the patient's breast cancer adequately characterize this lesion, renal protocol MRI or CT scan would be recommended in 6 months time. 3. Other imaging findings of potential clinical significance: The Aortic Atherosclerosis (ICD10-I70.0). Ectatic ascending thoracic aorta without aneurysm. Small type 1 hiatal hernia. Several small hypodense liver lesions are likely benign although technically too small to characterize. Nonobstructive 1.2 cm right renal calculus. Anterior uterine fundal myometrial mass favoring fibroid. Pelvic floor laxity with small cystocele. Notable spondylosis at L3-4 and L5-S1.       05/22/2017 Imaging    Bone Scan Whole Body 05/22/17 IMPRESSION: 1. No findings of osseous metastatic disease. 2. Lower lumbar activity corresponds to areas of significant benign spondylosis on the CT scan.      05/24/2017 -  Chemotherapy    TCHP every 3 weeks for 6 cycles starting 05/24/17 followed by a year of maintenence Herceptin w/ or w/o Perjeta        07/12/2017 - 07/14/2017 Hospital Admission    Admit date: 07/12/17 Admission diagnosis: GI Hemorrhaging  Additional comments: Colonoscopy and EGD  done with no evidence of bleeding site. Given blood transfusion on 07/15/17       Cancer of central portion of left breast ([place])   05/02/2017 Breast MRI    IMPRESSION: 1. 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 2. 4 mm enhancing mass with associated architectural distortion in the central left breast. This is suspicious for additional focus of carcinoma.      05/03/2017 Mammogram    IMPRESSION: 1. Suspicious area of architectural distortion in the left breast which corresponds to a small enhancing mass on MRI.  RECOMMENDATION: Stereotactic core needle biopsy of the area of architectural distortion in the left breast.       05/03/2017 Breast US    Stereotactic core needle biopsy of the area of architectural distortion in the left breast.      05/03/2017 Initial Biopsy    Breast, left, needle core biopsy, central, slightly toward the lower, outer quadrant - INVASIVE DUCTAL CARCINOMA, MSBR GRADE 1/2. - SEE MICROSCOPIC DESCRIPTION. ER 50% positive PR 90% positive Ki67: 1%      05/07/2017 Initial Diagnosis    Cancer of central portion of left breast ([place])      HISTORY OF PRESENTING ILLNESS:  Madison Stokes 72 y.o. female is here because of newly diagnosed bilateral breast cancer. She was referred by Dr. Georgette Dover. She presents to clinic accompanied by her daughter in law and husband. She noticed a right breast mass in early September 2018, she underwent diagnostic right mammogram with ultrasound on 03/08/17; no discrete mass was seen sonographically or mammographically. She underwent right breast biopsy with flip placement on 04/24/17, found to be invasive mammary carcinoma, favoring lobular type. She was referred to Dr. Georgette Dover who then ordered bilateral breast MRI. Imaging identified non mass enhancement spanning 5.9 x 3.2 x 5.6 cm in the right breast as well as a small enhancing 4 mm mass in the central  left breast with a small hypoechoic focus. She then underwent diagnostic mammogram with [place] and biopsy in the left breast. Pathology revealed invasive ductal carcinoma. Her last mammogram was 04/2016, no abnormal mammogram or previous breast biopsy. She has not noticed associated breast changes, nipple discharge or inversion, or skin changes. No weight loss, decreased appetite, or fatigue.   She has no significant past medical history, she is on blood pressure and cholesterol medication. She is retired Network engineer, lives with her husband; has 2 adult sons. No tobacco or alcohol history. She reports having a cold lately she caught from her grandson but otherwise feels well. Occasionally has urgent BM after meals, denies abd fullness, pain, early satiety, nausea or vomiting.   GYN HISTORY  Menarchal: 46 LMP: age 67, menopause  Contraceptive:  HRT: None GP: 2 pregnancies, 2 births    CURRENT THERAPY: TCHP every 3 weeks for 6 cycles starting 05/24/17 followed by a year of maintenance Herceptin and Perjeta   INTERVAL HISTORY:  Madison Stokes is here for a follow up post hospitalization. She was admitted on 07/12/17 for GI bleeding. No origin of bleeding was found in GI workup. She received blood transfusion on 07/15/17 and tolerated it  well.  She presents to the clinic today accompanied by her husband. She notes she has not gotten the results from camera exam on 07/23/17. She notes how her blood loss started last week with dark and bright red blood for a few days. She went to symptom management clinic and then was taken to the ER. Pt is still not sure where her bleeding originated. She denies any pain or nausea from this episode. She was weak from blood loss.    On review of symptoms, pt notes she feels better and recovered after blood transfusion, denies current pain, weakness and has normal BMs. Her appetite has improved on week off chemo. This morning she had loose stool only.     SOCIAL  HISTORY: Social History   Socioeconomic History  . Marital status: Married    Spouse name: Not on file  . Number of children: Not on file  . Years of education: Not on file  . Highest education level: Not on file  Social Needs  . Financial resource strain: Not on file  . Food insecurity - worry: Not on file  . Food insecurity - inability: Not on file  . Transportation needs - medical: Not on file  . Transportation needs - non-medical: Not on file  Occupational History  . Not on file  Tobacco Use  . Smoking status: Never Smoker  . Smokeless tobacco: Never Used  Substance and Sexual Activity  . Alcohol use: Yes    Comment: 1 glass of wine per month  . Drug use: No  . Sexual activity: Not on file  Other Topics Concern  . Not on file  Social History Narrative  . Not on file    FAMILY HISTORY: Family History  Problem Relation Age of Onset  . Cancer Mother 75       uterine  . Cancer Brother        prostate    ALLERGIES:  has No Known Allergies.  MEDICATIONS:  Current Outpatient Medications  Medication Sig Dispense Refill  . aspirin EC 81 MG tablet Take 81 mg by mouth daily.     Marland Kitchen dexamethasone (DECADRON) 4 MG tablet Take 2 tablets (8 mg total) 2 (two) times daily by mouth. Start the day before Taxotere. Then again the day after chemo for 3 days. (Patient taking differently: Take 4-8 mg by mouth 2 (two) times daily. Start the day before Taxotere. Then again the day after chemo for 3 days.) 30 tablet 1  . irbesartan (AVAPRO) 150 MG tablet Take 150 mg by mouth daily.     Marland Kitchen lidocaine-prilocaine (EMLA) cream Apply to affected area once 30 g 3  . loperamide (IMODIUM) 2 MG capsule Take by mouth as needed for diarrhea or loose stools.    Marland Kitchen loratadine (CLARITIN) 10 MG tablet Take 10 mg by mouth daily as needed for allergies.    . metFORMIN (GLUCOPHAGE) 500 MG tablet Take 1 tablet (500 mg total) by mouth 2 (two) times daily with a meal. (Patient taking differently: Take 500 mg by  mouth 2 (two) times daily as needed (for B/S ONLY WHEN TAKING DECADRON). ) 60 tablet 0  . Multiple Vitamin (MULTIVITAMIN) tablet Take 1 tablet daily by mouth.    . ondansetron (ZOFRAN ODT) 8 MG disintegrating tablet Take 1 tablet (8 mg total) by mouth every 8 (eight) hours as needed for nausea or vomiting. Start on Day 3 post chemo 20 tablet 0  . pantoprazole (PROTONIX) 40 MG tablet Take 1 tablet (  40 mg total) by mouth daily. 30 tablet 1  . potassium chloride (K-DUR) 10 MEQ tablet Take 1 tablet (10 mEq total) by mouth daily. 5 tablet 0  . prochlorperazine (COMPAZINE) 10 MG tablet Take 1 tablet (10 mg total) every 6 (six) hours as needed by mouth (Nausea or vomiting). 30 tablet 1  . simvastatin (ZOCOR) 40 MG tablet Take 40 mg at bedtime by mouth.     No current facility-administered medications for this visit.     REVIEW OF SYSTEMS:   Constitutional: Denies fatigue, fevers, chills or abnormal night sweats   Eyes: Denies blurriness of vision, double vision or watery eyes Ears, nose, mouth, throat, and face: Denies mucositis or sore throat  Respiratory: Denies dyspnea or wheezes  Cardiovascular: Denies palpitation, chest discomfort or lower extremity swelling Gastrointestinal:  Denies nausea, vomiting (+) loose BM Vaginal: (+) on going discharge  Skin: (+) redness and swelling to bilateral hands, nearly resolved Lymphatics: Denies new lymphadenopathy or easy bruising Neurological:Denies tingling or new weaknesses (+) mild numbness on her feet at night  Behavioral/Psych: Mood is stable, no new changes  All other systems were reviewed with the patient and are negative.  PHYSICAL EXAMINATION: ECOG PERFORMANCE STATUS: 0 - Asymptomatic  Vitals:   07/25/17 0912  BP: (!) 144/87  Pulse: 88  Resp: 18  Temp: 97.9 F (36.6 C)  SpO2: 98%   Filed Weights   07/25/17 0912  Weight: 121 lb 4.8 oz (55 kg)     GENERAL:alert, no distress and comfortable SKIN: skin color, texture, turgor are  normal, no rashes or significant lesions EYES: normal, conjunctiva are pink and non-injected, sclera clear OROPHARYNX:no exudate, no erythema and lips, buccal mucosa, and tongue normal  NECK: supple, thyroid normal size, non-tender, without nodularity LYMPH:  no palpable cervical, supraclavicular, axillary, or inguinal lymphadenopathy  LUNGS: clear to auscultation bilaterally with normal breathing effort HEART: regular rate & rhythm and no murmurs and no lower extremity edema ABDOMEN:abdomen soft, non-tender and normal bowel sounds. No palpable hepatomegaly  Musculoskeletal:no cyanosis of digits and no clubbing  PSYCH: alert & oriented x 3 with fluent speech NEURO: no focal motor/sensory deficits BREASTS: inspection shows them to be symmetrical without nipple discharge or inversion. Right breast with (previously 4x5 cm) now 4x3.5 cm palpable mobile mass to upper outer breast with ecchymosis. Left breast with no palpable mass.  Bilateral axilla were negative for palpable adenopathy.    CBC Latest Ref Rng & Units 07/25/2017 07/14/2017 07/13/2017  WBC 3.9 - 10.3 K/uL 2.6(L) 17.1(H) 23.8(H)  Hemoglobin 11.6 - 15.9 g/dL 10.2(L) 9.0(L) 9.4(L)  Hematocrit 34.8 - 46.6 % 31.6(L) 27.5(L) 27.6(L)  Platelets 145 - 400 K/uL 205 238 221   CMP Latest Ref Rng & Units 07/25/2017 07/14/2017 07/13/2017  Glucose 70 - 140 mg/dL 107 94 90  BUN 7 - 26 mg/dL _0 Creatinine 0.60 - 1.10 mg/dL 0.71 0.74 0.75  Sodium 136 - 145 mmol/L 145 138 138  Potassium 3.5 - 5.1 mmol/L 3.8 3.3(L) 3.8  Chloride 98 - 109 mmol/L 111(H) 106 107  CO2 22 - 29 mmol/L _1 Calcium 8.4 - 10.4 mg/dL 8.8 8.2(L) 8.2(L)  Total Protein 6.4 - 8.3 g/dL 5.9(L) - -  Total Bilirubin 0.2 - 1.2 mg/dL <0.2(L) - -  Alkaline Phos 40 - 150 U/L 104 - -  AST 5 - 34 U/L 26 - -  ALT 0 - 55 U/L 30 - -     CA 27.29 05/13/17: 59.5  05/31/17: 50.0 07/04/17: 44.2  Diagnosis 05/03/17  Breast, left, needle core biopsy, central, slightly toward the  lower, outer quadrant - INVASIVE DUCTAL CARCINOMA, MSBR GRADE 1/2. - SEE MICROSCOPIC DESCRIPTION. ER 50% positive PR 90% positive Ki67 1% Microscopic Comment Breast prognostic profile will be performed. Called to [facility] on 05/06/2017.   Diagnosis 04/24/17  Breast, right, needle core biopsy, UOQ, centered at 9:30 o'clock - INVASIVE MAMMARY CARCINOMA. - SEE COMMENT. Microscopic Comment The carcinoma appears grade II. An E-cadherin and a breast prognostic profile will be performed and the results reported separately. The results were called to [facility] on 04/25/2017. (JBK:ecj 04/25/2017) Results: IMMUNOHISTOCHEMICAL AND MORPHOMETRIC ANALYSIS PERFORMED MANUALLY Estrogen Receptor: 100%, POSITIVE, STRONG STAINING INTENSITY Progesterone Receptor: 100%, POSITIVE, STRONG STAINING INTENSITY Proliferation Marker Ki67: 10% HER2: **POSITIVE**   PROCEDURES  Colonoscopy by Dr. Therisa Doyne 07/14/17 IMPRESSION:  - The examined portion of the ileum was normal. - The entire examined colon is normal. - The distal rectum and anal verge are normal on retroflexion view. - No specimens collected    EGD by Dr. Therisa Doyne 07/13/17 IMPRESSION: - Normal esophagus. - Z-line regular, 36 cm from the incisors. - 2 cm hiatal hernia. - Non-bleeding erosive gastropathy. - Erythematous mucosa in the prepyloric region of the stomach. - Duodenal erosions without bleeding. - No specimens collected.   ECHO 05/15/17  Impressions: - LVEF 60-65%, normal wall thickness, normal wall motion and GLS   strain, grade 1 DD, normal LV filling pressure, mild MR, normal   LA size, normal IVC.   RADIOGRAPHIC STUDIES: I have personally reviewed the radiological images as listed and agreed with the findings in the report.  Whole Body Bone Scan 05/22/17 IMPRESSION: 1. No findings of osseous metastatic disease. 2. Lower lumbar activity corresponds to areas of significant  benign spondylosis on the CT scan  CT CAP with Contrast IMPRESSION: 1. No specific CT findings of nodal or other metastatic disease in the chest, abdomen, and pelvis. 2. Bosniak category 33F cyst in the right mid kidney, with mild enhancement along a septation but without nodularity. Unless follow up abdominal scans related to the patient's breast cancer adequately characterize this lesion, renal protocol MRI or CT scan would be recommended in 6 months time. 3. Other imaging findings of potential clinical significance: The Aortic Atherosclerosis (ICD10-I70.0). Ectatic ascending thoracic aorta without aneurysm. Small type 1 hiatal hernia. Several small hypodense liver lesions are likely benign although technically too small to characterize. Nonobstructive 1.2 cm right renal calculus. Anterior uterine fundal myometrial mass favoring fibroid. Pelvic floor laxity with small cystocele. Notable spondylosis at L3-4 and L5-S1.  Diagnostic Mammogram Bilateral 05/03/17  IMPRESSION: 1. 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 2. 4 mm enhancing mass with associated architectural distortion in the central left breast. This is suspicious for additional focus of Carcinoma.  Diagnostic Mammogram, right 03/08/17 IMPRESSION: 1. Patient has a is palpable mass in the 9:30 position in the lateral right breast 2 cm from the nipple, in the location dense fibroglandular breast tissue. Although no discrete mass is seen sonographically or mammographically, the Clinical change remains concerning. Biopsy is recommended.  No results found.  ASSESSMENT & PLAN:  72 y.o. caucasian postmenopausal woman   1.  Cancer of overlapping sites of right breast, invasive lobular carcinoma, Stage IB cT3, cN0, cM0, G2; ER positive, PR positive, HER2 positive  2.  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 In her right breast, she has large area of disease that is HER2 positive. -Due to the high risk of recurrence from right breast cancer, she would benefit from neoadjuvant or adjuvant chemotherapy.  I recommend neoadjuvant chemotherapy to shrink her right breast cancer, to see if lumpectomy would be more feasible after neoadjuvant chemotherapy TCHP every 3 weeks for 6 cycles, followed by maintenance Herceptin with or without Perjeta to complete 1 year therapy. The goal of therapy is curative.  Discussed the benefits and potential side effects from chemo, she is agreeable. I will discuss with her surgeon Dr. Georgette Dover.   -We also reviewed that lobular carcinoma is less sensitive to chemotherapy, will monitor her disease closely during the chemo, and likely get a interim scan to confirm her response to chemo. -Baseline CA 27.29 was 59.5, elevated   -I discussed her CT CAP and Bone scan from 05/22/17 shows no evidence of metastasis. There was incidental findings of 37F cyst in her right kidney and a non-obstructing kidney stone, will monitor.  -Baseline ECHO from 04/2017 is normal.  -She started [facility] on 05/24/17 with Onpro, tolerated moderately well with controlled diarrhea and mild fatigue and hair loss.  -she developed a significant bilateral finger joints swollen and redness, resolved with steroids. -s/p 3 cycles of TCHP she experienced GI hemorrhaging with diarrhea and was admitted to hospital on 07/12/17. Her HG dropped to 5.7 and HCT at 17.4. Inpatient colonoscopy and EGD showed no signs of bleeding. She was given a blood transfusion on 07/15/17.  -Her Prilosec was changed to Protonix in hospital, will continue Protonix for indigestion caused by decadron.  -Labs reviewed, Hg improved to 10.2 with good response to blood transfusion. Her WBC at 2.6, plt and ANC normal. Will recheck her labs next week  to monitor her lab and make sure she does not have severe thrombocytopenia after chemo. -On physical exam, her right breast mass has only slightly decreased in size.  No other new lesion or adenopathy. I am concerned she is not responding well to chemotherapy.  I will obtain a breast MRI with and without contrast for next cycle to evaluate her response, before we proceed with the last 2 cycle of chemo. -Will continue with the low dose Decadron BID for 2 days prior to treatment then once daily for 2 days after treatment. Pt is ready for Cycle 4 of TCHP tomorrow 07/26/17 -F/u on 08/15/16   2. Genetics  -I reached out to genetics counselor to see if patient qualifies for genetics referral, she has synchronous bilateral ductal and lobular carcinoma, her brother has prostate cancer and her mother had uterine cancer in her 72's. I will follow up.   3. Bosniak category 37F cyst in the right mid kidney, 1.2cm Non-obstructive kidney stone -found on 05/22/17 CT Scan  -Will continue to monitor.    4. Borderline DM,  secondary to steroids, yeast infection symptoms -I will reduce her dexa dose to '4mg'$  BID instead of '8mg'$  BID around her chemo  -I encouraged her to increase water intake and watch her diet -will copy note to Dr. Kenton Kingfisher to provide her with  more information of diabetic education.  -I called in fluconazole (05/23/17), to take one dose to treat.  -If she has persistent hypoglycemia I will recommend her to start, diabetic medication, such as metformin. -she  developed hyperglycemia with BG 224 with 1st cycle. She will take 4 mg BID day prior and day after for 3 days instead of 8 mg BID.  -Dietician consult was set up to help pt  -HB1c is 6.4 as of 05/31/17   -I previously reviewed what signs of hyperglycemia to look for and encouraged her to monitor her BG at home.  -Pt is not on medication for DM. I suggested starting Metformin to control her sugar with the plan to stop taking it once she has completed  her chemotherapy. I discussed the side effects of Metformin, including diarrhea. I prescribed on 07/04/17 and I suggested she speak to her PCP about this and they will monitor. -Better controlled BG  5. Anemia, secondary to chemotherapy, and history of GI bleeding in 06/2017 - She is asymptomatic with Hgb 10.9 on 07/04/17. No need for blood transfusion -Experienced significant GI bleeding with HG of 5.7 and HCT of 17.4 and was hospitalized on 07/12/17. GI workup showed no site of bleeding. She received blood transfusion on 07/15/17.  -Hg has improved to 10.2 (07/25/17), good response to blood transfusion.  -continue PPI    6. Bilateral hand edema and redness, secondary to chemotherapy  -She developed significant bilateral finger redness and swelling after cycle 2 chemo, probably related to chemo.  Resolved after course of steroids - she will monitor at home and call if it recurs  -She will continue dexamethasone 4 mg twice daily the day before, and 2 days after chemotherapy, then 4 mg daily on day 4 and 5  -resolved   PLAN: -Labs reviewed and adequate to proceed with cycle 4 TCHP tomorrow  -Breast MRI in 2 weeks to evaluate her response, to determine about next 2 cycle chemo  -Lab 2/8 or 2/11 for CBC  -F/U in 3 weeks      Orders Placed This Encounter  Procedures  . MR BREAST BILATERAL W WO CONTRAST INC CAD    Standing Status:   Future    Standing Expiration Date:   09/23/2018    Scheduling Instructions:     Evaluate chemo response    Order Specific Question:   If indicated for the ordered procedure, I authorize the administration of contrast media per Radiology protocol    Answer:   Yes    Order Specific Question:   What is the patient's sedation requirement?    Answer:   No Sedation    Order Specific Question:   Does the patient have a pacemaker or implanted devices?    Answer:   No    Order Specific Question:   Radiology Contrast Protocol - do NOT remove file path    Answer:    [email or web address]    Order Specific Question:   Preferred imaging location?    Answer:   [facility] (table limit-550lbs)    All questions were answered. The patient knows to call the clinic with any problems, questions or concerns.   [name], MD 07/25/2017   This document serves as a record of services personally performed by [name], MD. It was created on her behalf by Joslyn Devon, a trained medical scribe. The creation of this record is based on the scribe's personal observations and the provider's statements to them.    I have reviewed the above documentation for accuracy and completeness, and I agree with the above.

## 2017-07-25 ENCOUNTER — Encounter: Payer: Self-pay | Admitting: Hematology

## 2017-07-25 ENCOUNTER — Inpatient Hospital Stay: Payer: Medicare Other

## 2017-07-25 ENCOUNTER — Inpatient Hospital Stay (HOSPITAL_BASED_OUTPATIENT_CLINIC_OR_DEPARTMENT_OTHER): Payer: Medicare Other | Admitting: Hematology

## 2017-07-25 VITALS — BP 144/87 | HR 88 | Temp 97.9°F | Resp 18 | Ht 62.0 in | Wt 121.3 lb

## 2017-07-25 DIAGNOSIS — Z7952 Long term (current) use of systemic steroids: Secondary | ICD-10-CM

## 2017-07-25 DIAGNOSIS — K30 Functional dyspepsia: Secondary | ICD-10-CM | POA: Diagnosis not present

## 2017-07-25 DIAGNOSIS — C50112 Malignant neoplasm of central portion of left female breast: Secondary | ICD-10-CM

## 2017-07-25 DIAGNOSIS — D500 Iron deficiency anemia secondary to blood loss (chronic): Secondary | ICD-10-CM

## 2017-07-25 DIAGNOSIS — C50811 Malignant neoplasm of overlapping sites of right female breast: Secondary | ICD-10-CM

## 2017-07-25 DIAGNOSIS — I10 Essential (primary) hypertension: Secondary | ICD-10-CM

## 2017-07-25 DIAGNOSIS — D6481 Anemia due to antineoplastic chemotherapy: Secondary | ICD-10-CM | POA: Diagnosis not present

## 2017-07-25 DIAGNOSIS — Z5111 Encounter for antineoplastic chemotherapy: Secondary | ICD-10-CM | POA: Diagnosis not present

## 2017-07-25 DIAGNOSIS — Z170 Estrogen receptor positive status [ER+]: Secondary | ICD-10-CM | POA: Diagnosis not present

## 2017-07-25 DIAGNOSIS — Z5189 Encounter for other specified aftercare: Secondary | ICD-10-CM | POA: Diagnosis not present

## 2017-07-25 DIAGNOSIS — Z5112 Encounter for antineoplastic immunotherapy: Secondary | ICD-10-CM | POA: Diagnosis not present

## 2017-07-25 DIAGNOSIS — N281 Cyst of kidney, acquired: Secondary | ICD-10-CM | POA: Diagnosis not present

## 2017-07-25 DIAGNOSIS — R0602 Shortness of breath: Secondary | ICD-10-CM | POA: Diagnosis not present

## 2017-07-25 DIAGNOSIS — R739 Hyperglycemia, unspecified: Secondary | ICD-10-CM

## 2017-07-25 DIAGNOSIS — E119 Type 2 diabetes mellitus without complications: Secondary | ICD-10-CM

## 2017-07-25 DIAGNOSIS — K922 Gastrointestinal hemorrhage, unspecified: Secondary | ICD-10-CM | POA: Diagnosis not present

## 2017-07-25 LAB — COMPREHENSIVE METABOLIC PANEL
ALT: 30 U/L (ref 0–55)
AST: 26 U/L (ref 5–34)
Albumin: 3.4 g/dL — ABNORMAL LOW (ref 3.5–5.0)
Alkaline Phosphatase: 104 U/L (ref 40–150)
Anion gap: 8 (ref 3–11)
BUN: 13 mg/dL (ref 7–26)
CO2: 26 mmol/L (ref 22–29)
Calcium: 8.8 mg/dL (ref 8.4–10.4)
Chloride: 111 mmol/L — ABNORMAL HIGH (ref 98–109)
Creatinine, Ser: 0.71 mg/dL (ref 0.60–1.10)
GFR calc Af Amer: >60 mL/min (ref >60)
GFR calc non Af Amer: >60 mL/min (ref >60)
Glucose, Bld: 107 mg/dL (ref 70–140)
Potassium: 3.8 mmol/L (ref 3.5–5.1)
Sodium: 145 mmol/L (ref 136–145)
Total Bilirubin: <0.2 mg/dL — ABNORMAL LOW (ref 0.2–1.2)
Total Protein: 5.9 g/dL — ABNORMAL LOW (ref 6.4–8.3)

## 2017-07-25 LAB — CBC WITH DIFFERENTIAL/PLATELET
Basophils Absolute: 0 10*3/uL (ref 0.0–0.1)
Basophils Relative: 1 %
Eosinophils Absolute: 0 10*3/uL (ref 0.0–0.5)
Eosinophils Relative: 1 %
HCT: 31.6 % — ABNORMAL LOW (ref 34.8–46.6)
Hemoglobin: 10.2 g/dL — ABNORMAL LOW (ref 11.6–15.9)
Lymphocytes Relative: 23 %
Lymphs Abs: 0.6 10*3/uL — ABNORMAL LOW (ref 0.9–3.3)
MCH: 32.1 pg (ref 25.1–34.0)
MCHC: 32.3 g/dL (ref 31.5–36.0)
MCV: 99.4 fL (ref 79.5–101.0)
Monocytes Absolute: 0.3 10*3/uL (ref 0.1–0.9)
Monocytes Relative: 10 %
Neutro Abs: 1.7 10*3/uL (ref 1.5–6.5)
Neutrophils Relative %: 65 %
Platelets: 205 10*3/uL (ref 145–400)
RBC: 3.18 MIL/uL — ABNORMAL LOW (ref 3.70–5.45)
RDW: 17.4 % — ABNORMAL HIGH (ref 11.2–14.5)
WBC: 2.6 10*3/uL — ABNORMAL LOW (ref 3.9–10.3)

## 2017-07-25 MED ORDER — SODIUM CHLORIDE 0.9% FLUSH
10.0000 mL | Freq: Once | INTRAVENOUS | Status: AC
  Administered 2017-07-25: 10 mL
  Filled 2017-07-25: qty 10

## 2017-07-26 ENCOUNTER — Telehealth: Payer: Self-pay | Admitting: Hematology

## 2017-07-26 ENCOUNTER — Inpatient Hospital Stay: Payer: Medicare Other | Attending: Hematology

## 2017-07-26 VITALS — BP 134/82 | HR 82 | Temp 98.3°F | Resp 16

## 2017-07-26 DIAGNOSIS — C50112 Malignant neoplasm of central portion of left female breast: Secondary | ICD-10-CM | POA: Insufficient documentation

## 2017-07-26 DIAGNOSIS — Z170 Estrogen receptor positive status [ER+]: Secondary | ICD-10-CM

## 2017-07-26 DIAGNOSIS — Z5112 Encounter for antineoplastic immunotherapy: Secondary | ICD-10-CM | POA: Diagnosis not present

## 2017-07-26 DIAGNOSIS — Z5111 Encounter for antineoplastic chemotherapy: Secondary | ICD-10-CM | POA: Insufficient documentation

## 2017-07-26 DIAGNOSIS — C50811 Malignant neoplasm of overlapping sites of right female breast: Secondary | ICD-10-CM | POA: Diagnosis not present

## 2017-07-26 MED ORDER — SODIUM CHLORIDE 0.9 % IV SOLN
Freq: Once | INTRAVENOUS | Status: AC
  Administered 2017-07-26: 08:00:00 via INTRAVENOUS

## 2017-07-26 MED ORDER — TRASTUZUMAB CHEMO 150 MG IV SOLR
6.0000 mg/kg | Freq: Once | INTRAVENOUS | Status: AC
  Administered 2017-07-26: 336 mg via INTRAVENOUS
  Filled 2017-07-26: qty 16

## 2017-07-26 MED ORDER — PALONOSETRON HCL INJECTION 0.25 MG/5ML
INTRAVENOUS | Status: AC
  Filled 2017-07-26: qty 5

## 2017-07-26 MED ORDER — SODIUM CHLORIDE 0.9 % IV SOLN
75.0000 mg/m2 | Freq: Once | INTRAVENOUS | Status: AC
  Administered 2017-07-26: 120 mg via INTRAVENOUS
  Filled 2017-07-26: qty 12

## 2017-07-26 MED ORDER — PEGFILGRASTIM 6 MG/0.6ML ~~LOC~~ PSKT
PREFILLED_SYRINGE | SUBCUTANEOUS | Status: AC
  Filled 2017-07-26: qty 0.6

## 2017-07-26 MED ORDER — HEPARIN SOD (PORK) LOCK FLUSH 100 UNIT/ML IV SOLN
500.0000 [IU] | Freq: Once | INTRAVENOUS | Status: AC | PRN
  Administered 2017-07-26: 500 [IU]
  Filled 2017-07-26: qty 5

## 2017-07-26 MED ORDER — SODIUM CHLORIDE 0.9 % IV SOLN
435.0000 mg | Freq: Once | INTRAVENOUS | Status: AC
  Administered 2017-07-26: 440 mg via INTRAVENOUS
  Filled 2017-07-26: qty 44

## 2017-07-26 MED ORDER — DIPHENHYDRAMINE HCL 25 MG PO CAPS
ORAL_CAPSULE | ORAL | Status: AC
  Filled 2017-07-26: qty 2

## 2017-07-26 MED ORDER — DIPHENHYDRAMINE HCL 25 MG PO CAPS
50.0000 mg | ORAL_CAPSULE | Freq: Once | ORAL | Status: AC
  Administered 2017-07-26: 50 mg via ORAL

## 2017-07-26 MED ORDER — ACETAMINOPHEN 325 MG PO TABS
650.0000 mg | ORAL_TABLET | Freq: Once | ORAL | Status: AC
  Administered 2017-07-26: 650 mg via ORAL

## 2017-07-26 MED ORDER — SODIUM CHLORIDE 0.9 % IV SOLN
420.0000 mg | Freq: Once | INTRAVENOUS | Status: AC
  Administered 2017-07-26: 420 mg via INTRAVENOUS
  Filled 2017-07-26: qty 14

## 2017-07-26 MED ORDER — ACETAMINOPHEN 325 MG PO TABS
ORAL_TABLET | ORAL | Status: AC
  Filled 2017-07-26: qty 2

## 2017-07-26 MED ORDER — SODIUM CHLORIDE 0.9% FLUSH
10.0000 mL | INTRAVENOUS | Status: DC | PRN
  Administered 2017-07-26: 10 mL
  Filled 2017-07-26: qty 10

## 2017-07-26 MED ORDER — PALONOSETRON HCL INJECTION 0.25 MG/5ML
0.2500 mg | Freq: Once | INTRAVENOUS | Status: AC
  Administered 2017-07-26: 0.25 mg via INTRAVENOUS

## 2017-07-26 MED ORDER — SODIUM CHLORIDE 0.9 % IV SOLN
Freq: Once | INTRAVENOUS | Status: AC
  Administered 2017-07-26: 08:00:00 via INTRAVENOUS
  Filled 2017-07-26: qty 5

## 2017-07-26 MED ORDER — PEGFILGRASTIM 6 MG/0.6ML ~~LOC~~ PSKT
6.0000 mg | PREFILLED_SYRINGE | Freq: Once | SUBCUTANEOUS | Status: AC
  Administered 2017-07-26: 6 mg via SUBCUTANEOUS

## 2017-07-26 NOTE — Telephone Encounter (Signed)
Called patient with lab appointment per 1/31 los

## 2017-07-26 NOTE — Patient Instructions (Addendum)
[facility] Discharge Instructions for Patients Receiving Chemotherapy  Today you received the following chemotherapy agents Carboplatin, Taxotere, Herceptin, and Perjeta.   To help prevent nausea and vomiting after your treatment, we encourage you to take your nausea medication as directed.   If you develop nausea and vomiting that is not controlled by your nausea medication, call the clinic.   BELOW ARE SYMPTOMS THAT SHOULD BE REPORTED IMMEDIATELY:  *FEVER GREATER THAN 100.5 F  *CHILLS WITH OR WITHOUT FEVER  NAUSEA AND VOMITING THAT IS NOT CONTROLLED WITH YOUR NAUSEA MEDICATION  *UNUSUAL SHORTNESS OF BREATH  *UNUSUAL BRUISING OR BLEEDING  TENDERNESS IN MOUTH AND THROAT WITH OR WITHOUT PRESENCE OF ULCERS  *URINARY PROBLEMS  *BOWEL PROBLEMS  UNUSUAL RASH Items with * indicate a potential emergency and should be followed up as soon as possible.  Feel free to call the clinic should you have any questions or concerns. The clinic phone number is [phone].  Please show the [facility] at check-in to the Emergency Department and triage nurse.

## 2017-07-31 ENCOUNTER — Other Ambulatory Visit: Payer: Self-pay | Admitting: Hematology

## 2017-08-02 ENCOUNTER — Inpatient Hospital Stay: Payer: Medicare Other

## 2017-08-02 DIAGNOSIS — C50811 Malignant neoplasm of overlapping sites of right female breast: Secondary | ICD-10-CM

## 2017-08-02 DIAGNOSIS — Z170 Estrogen receptor positive status [ER+]: Secondary | ICD-10-CM

## 2017-08-02 DIAGNOSIS — C50112 Malignant neoplasm of central portion of left female breast: Secondary | ICD-10-CM | POA: Diagnosis not present

## 2017-08-02 DIAGNOSIS — Z5112 Encounter for antineoplastic immunotherapy: Secondary | ICD-10-CM | POA: Diagnosis not present

## 2017-08-02 DIAGNOSIS — Z5111 Encounter for antineoplastic chemotherapy: Secondary | ICD-10-CM | POA: Diagnosis not present

## 2017-08-02 LAB — COMPREHENSIVE METABOLIC PANEL
ALT: 26 U/L (ref 0–55)
AST: 19 U/L (ref 5–34)
Albumin: 3.6 g/dL (ref 3.5–5.0)
Alkaline Phosphatase: 137 U/L (ref 40–150)
Anion gap: 10 (ref 3–11)
BUN: 14 mg/dL (ref 7–26)
CO2: 26 mmol/L (ref 22–29)
Calcium: 9 mg/dL (ref 8.4–10.4)
Chloride: 103 mmol/L (ref 98–109)
Creatinine, Ser: 1 mg/dL (ref 0.60–1.10)
GFR calc Af Amer: >60 mL/min (ref >60)
GFR calc non Af Amer: 56 mL/min — ABNORMAL LOW (ref >60)
Glucose, Bld: 108 mg/dL (ref 70–140)
Potassium: 4 mmol/L (ref 3.5–5.1)
Sodium: 139 mmol/L (ref 136–145)
Total Bilirubin: 0.3 mg/dL (ref 0.2–1.2)
Total Protein: 6.1 g/dL — ABNORMAL LOW (ref 6.4–8.3)

## 2017-08-02 LAB — CBC WITH DIFFERENTIAL/PLATELET
Basophils Absolute: 0.1 10*3/uL (ref 0.0–0.1)
Basophils Relative: 1 %
Eosinophils Absolute: 0 10*3/uL (ref 0.0–0.5)
Eosinophils Relative: 0 %
HCT: 35.8 % (ref 34.8–46.6)
Hemoglobin: 11.8 g/dL (ref 11.6–15.9)
Lymphocytes Relative: 9 %
Lymphs Abs: 1.2 10*3/uL (ref 0.9–3.3)
MCH: 31.7 pg (ref 25.1–34.0)
MCHC: 32.9 g/dL (ref 31.5–36.0)
MCV: 96.3 fL (ref 79.5–101.0)
Monocytes Absolute: 1.8 10*3/uL — ABNORMAL HIGH (ref 0.1–0.9)
Monocytes Relative: 13 %
Neutro Abs: 10.2 10*3/uL — ABNORMAL HIGH (ref 1.5–6.5)
Neutrophils Relative %: 77 %
Platelets: 221 10*3/uL (ref 145–400)
RBC: 3.71 MIL/uL (ref 3.70–5.45)
RDW: 17.7 % — ABNORMAL HIGH (ref 11.2–14.5)
WBC: 13.2 10*3/uL — ABNORMAL HIGH (ref 3.9–10.3)

## 2017-08-03 LAB — CANCER ANTIGEN 27.29: CA 27.29: 49.6 U/mL — ABNORMAL HIGH (ref 0.0–38.6)

## 2017-08-10 ENCOUNTER — Ambulatory Visit
Admission: RE | Admit: 2017-08-10 | Discharge: 2017-08-10 | Disposition: A | Discharge status: Home / Self Care | Payer: Medicare Other | Source: Ambulatory Visit | Attending: Hematology | Admitting: Hematology

## 2017-08-10 DIAGNOSIS — N6310 Unspecified lump in the right breast, unspecified quadrant: Secondary | ICD-10-CM | POA: Diagnosis not present

## 2017-08-10 DIAGNOSIS — D0501 Lobular carcinoma in situ of right breast: Secondary | ICD-10-CM | POA: Diagnosis not present

## 2017-08-10 DIAGNOSIS — C50811 Malignant neoplasm of overlapping sites of right female breast: Secondary | ICD-10-CM

## 2017-08-10 DIAGNOSIS — Z170 Estrogen receptor positive status [ER+]: Secondary | ICD-10-CM

## 2017-08-10 MED ORDER — GADOBENATE DIMEGLUMINE 529 MG/ML IV SOLN
10.0000 mL | Freq: Once | INTRAVENOUS | Status: AC | PRN
  Administered 2017-08-10: 10 mL via INTRAVENOUS

## 2017-08-13 NOTE — Progress Notes (Signed)
[place]  [phone] [phone]  Clinic Follow Up Note   Patient Care Team: [name], MD as PCP - General (Family Medicine) [name], MD as Consulting Physician (Hematology) [name], NP as Nurse Practitioner (Nurse Practitioner) [name], MD as Consulting Physician (General Surgery)   Date of Service:  08/15/2017  CHIEF COMPLAINTS:  F/u bilateral breast cancer    Oncology History   Cancer Staging Cancer of central portion of left breast [facility]) Staging form: Breast, AJCC 8th Edition - Clinical stage from 05/03/2017: Stage IA (cT1a, cN0, cM0, G1, ER: Positive, PR: Positive, HER2: Negative) - Signed by [name], MD on 05/07/2017  Cancer of overlapping sites of right female breast [facility]) Staging form: Breast, AJCC 8th Edition - Clinical stage from 04/24/2017: Stage IB (cT3, cN0, cM0, G2, ER: Positive, PR: Positive, HER2: Positive) - Signed by [name], MD on 05/07/2017       Cancer of overlapping sites of right female breast ([place])   03/08/2017 Mammogram    IMPRESSION: 1. Patient has a is palpable mass in the lateral right breast, in the location dense fibroglandular breast tissue. Although no discrete mass is seen sonographically or mammographically, the Clinical change remains concerning. Biopsy is recommended.  RECOMMENDATION: 1. Ultrasound-guided core needle biopsy of palpable abnormality in the lateral right breast.      04/24/2017 Breast US    IMPRESSION: Ultrasound guided biopsy of the right breast. No apparent complications.      04/24/2017 Initial Biopsy    Breast, right, needle core biopsy, UOQ, centered at 9:30 o'clock - INVASIVE MAMMARY CARCINOMA. - SEE COMMENT. ER 100% positive PR 100% positive Ki67 10% HER2 positive      05/02/2017 Breast MRI    IMPRESSION: 1. 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 2. 4 mm  enhancing mass with associated architectural distortion in the central left breast. This is suspicious for additional focus of carcinoma.      05/07/2017 Initial Diagnosis    Cancer of overlapping sites of right female breast ([place])      05/15/2017 Echocardiogram    ECHO 05/15/17  Impressions: - LVEF 60-65%, normal wall thickness, normal wall motion and GLS   strain, grade 1 DD, normal LV filling pressure, mild MR, normal   LA size, normal IVC.         05/22/2017 Imaging    CT CAP W Contrast 05/22/17 IMPRESSION: 1. No specific CT findings of nodal or other metastatic disease in the chest, abdomen, and pelvis. 2. Bosniak category 90F cyst in the right mid kidney, with mild enhancement along a septation but without nodularity. Unless follow up abdominal scans related to the patient's breast cancer adequately characterize this lesion, renal protocol MRI or CT scan would be recommended in 6 months time. 3. Other imaging findings of potential clinical significance: The Aortic Atherosclerosis (ICD10-I70.0). Ectatic ascending thoracic aorta without aneurysm. Small type 1 hiatal hernia. Several small hypodense liver lesions are likely benign although technically too small to characterize. Nonobstructive 1.2 cm right renal calculus. Anterior uterine fundal myometrial mass favoring fibroid. Pelvic floor laxity with small cystocele. Notable spondylosis at L3-4 and L5-S1.       05/22/2017 Imaging    Bone Scan Whole Body 05/22/17 IMPRESSION: 1. No findings of osseous metastatic disease. 2. Lower lumbar activity corresponds to areas of significant benign spondylosis on the CT scan.      05/24/2017 -  Chemotherapy    TCHP every 3 weeks for 6 cycles starting 05/24/17 followed by a year of maintenance Herceptin and Perjeta. Given lack of response she stopped TCHP after 4 cycles.  She will proceed with Maintenance Herceptin and Perjeta on 08/16/17.         07/12/2017 - 07/14/2017  Hospital Admission    Admit date: 07/12/17 Admission diagnosis: GI Hemorrhaging  Additional comments: Colonoscopy and EGD done with no evidence of bleeding site. Given blood transfusion on 07/15/17      08/02/2017 Imaging    MRI Breast Bilateral 2/16//18 IMPRESSION: 1. The known lobular carcinoma of the right breast has not significantly changed since the prior MRI from 05/02/2017. The disease spans approximately 5.6 cm, previously 5.9 cm in the anterior to posterior dimension. 2. 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 3.  No new suspicious findings in either breast. 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       Anti-estrogen oral therapy    Letrozole once daily starting 08/16/17        Cancer of central portion of left breast ([place])   05/02/2017 Breast MRI    IMPRESSION: 1. 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 2. 4 mm enhancing mass with associated architectural distortion in the central left breast. This is suspicious for additional focus of carcinoma.      05/03/2017 Mammogram    IMPRESSION: 1. Suspicious area of architectural distortion in the left breast which corresponds to a small enhancing mass on MRI.  RECOMMENDATION: Stereotactic core needle biopsy of the area of architectural distortion in the left breast.       05/03/2017 Breast US    Stereotactic core needle biopsy of the area of architectural distortion in the left breast.      05/03/2017 Initial Biopsy    Breast, left, needle core biopsy, central, slightly toward the lower, outer quadrant - INVASIVE DUCTAL CARCINOMA, MSBR GRADE 1/2. - SEE MICROSCOPIC DESCRIPTION. ER 50% positive PR 90% positive Ki67: 1%        05/07/2017 Initial Diagnosis    Cancer of central portion of left breast ([place])      HISTORY OF PRESENTING ILLNESS:  Madison Stokes 72 y.o. female is here because of newly diagnosed bilateral breast cancer. She was referred by Dr. Georgette Dover. She presents to clinic accompanied by her daughter in law and husband. She noticed a right breast mass in Stokes September 2018, she underwent diagnostic right mammogram with ultrasound on 03/08/17; no discrete mass was seen sonographically or mammographically. She underwent right breast biopsy with flip placement on 04/24/17, found to be invasive mammary carcinoma, favoring lobular type. She was referred to Dr. Georgette Dover who then ordered bilateral breast MRI. Imaging identified non mass enhancement spanning 5.9 x 3.2 x 5.6 cm in the right breast as well as a small enhancing 4 mm mass in the central left breast with a small hypoechoic focus. She then underwent diagnostic mammogram with [place] and biopsy in the left breast. Pathology revealed invasive ductal carcinoma. Her last mammogram was 04/2016, no abnormal mammogram or previous breast biopsy. She has not noticed associated breast changes, nipple discharge or inversion, or skin changes. No weight loss, decreased appetite, or fatigue.   She has no  significant past medical history, she is on blood pressure and cholesterol medication. She is retired Network engineer, lives with her husband; has 2 adult sons. No tobacco or alcohol history. She reports having a cold lately she caught from her grandson but otherwise feels well. Occasionally has urgent BM after meals, denies abd fullness, pain, Stokes satiety, nausea or vomiting.   GYN HISTORY  Menarchal: 39 LMP: age 63, menopause  Contraceptive:  HRT: None GP: 2 pregnancies, 2 births    CURRENT THERAPY:  1. Letrozole once daily starting 08/16/17  2. Herceptin and Perjeta every 3 weeks for maintenance therapy starting 08/16/17   INTERVAL HISTORY:  Madison Stokes is here for a  follow up for her bilateral breast cancer and to review her recent Breast MRI from 08/02/17. She presents to the clinic today accompanied by her neighbor who was recently treated for her own triple negative breast cancer.  Pt notes she tolerated her last cycle treatment well but took her longer to recover this time.    On review of symptoms, pt notes palmer and facial erythema, frequent BM. She has had 4 BM in 1 day. She uses imodium and now diarrhea is resolved.  She denies fever, bleeding, no rash anywhere.      SOCIAL HISTORY: Social History   Socioeconomic History  . Marital status: Married    Spouse name: Not on file  . Number of children: Not on file  . Years of education: Not on file  . Highest education level: Not on file  Social Needs  . Financial resource strain: Not on file  . Food insecurity - worry: Not on file  . Food insecurity - inability: Not on file  . Transportation needs - medical: Not on file  . Transportation needs - non-medical: Not on file  Occupational History  . Not on file  Tobacco Use  . Smoking status: Never Smoker  . Smokeless tobacco: Never Used  Substance and Sexual Activity  . Alcohol use: Yes    Comment: 1 glass of wine per month  . Drug use: No  . Sexual activity: Not on file  Other Topics Concern  . Not on file  Social History Narrative  . Not on file    FAMILY HISTORY: Family History  Problem Relation Age of Onset  . Cancer Mother 71       uterine  . Cancer Brother        prostate    ALLERGIES:  has No Known Allergies.  MEDICATIONS:  Current Outpatient Medications  Medication Sig Dispense Refill  . aspirin EC 81 MG tablet Take 81 mg by mouth daily.     Marland Kitchen dexamethasone (DECADRON) 4 MG tablet Take 2 tablets (8 mg total) 2 (two) times daily by mouth. Start the day before Taxotere. Then again the day after chemo for 3 days. (Patient taking differently: Take 4-8 mg by mouth 2 (two) times daily. Start the day before Taxotere. Then  again the day after chemo for 3 days.) 30 tablet 1  . irbesartan (AVAPRO) 150 MG tablet Take 150 mg by mouth daily.     Marland Kitchen lidocaine-prilocaine (EMLA) cream Apply to affected area once 30 g 3  . loperamide (IMODIUM) 2 MG capsule Take by mouth as needed for diarrhea or loose stools.    Marland Kitchen loratadine (CLARITIN) 10 MG tablet Take 10 mg by mouth daily as needed for allergies.    . metFORMIN (GLUCOPHAGE) 500 MG tablet Take 1 tablet (500 mg total) by mouth 2 (  two) times daily with a meal. (Patient taking differently: Take 500 mg by mouth 2 (two) times daily as needed (for B/S ONLY WHEN TAKING DECADRON). ) 60 tablet 0  . Multiple Vitamin (MULTIVITAMIN) tablet Take 1 tablet daily by mouth.    . ondansetron (ZOFRAN ODT) 8 MG disintegrating tablet Take 1 tablet (8 mg total) by mouth every 8 (eight) hours as needed for nausea or vomiting. Start on Day 3 post chemo 20 tablet 0  . pantoprazole (PROTONIX) 40 MG tablet Take 1 tablet (40 mg total) by mouth daily. 30 tablet 1  . prochlorperazine (COMPAZINE) 10 MG tablet Take 1 tablet (10 mg total) every 6 (six) hours as needed by mouth (Nausea or vomiting). 30 tablet 1  . simvastatin (ZOCOR) 40 MG tablet Take 40 mg at bedtime by mouth.    . letrozole (FEMARA) 2.5 MG tablet Take 1 tablet (2.5 mg total) by mouth daily. 30 tablet 3   No current facility-administered medications for this visit.     REVIEW OF SYSTEMS:  Constitutional: Denies fatigue, fevers, chills or abnormal night sweats   Eyes: Denies blurriness of vision, double vision or watery eyes Ears, nose, mouth, throat, and face: Denies mucositis or sore throat  Respiratory: Denies dyspnea or wheezes  Cardiovascular: Denies palpitation, chest discomfort or lower extremity swelling Gastrointestinal:  Denies nausea, vomiting (+) loose BM Skin: (+) mild palmer and facial erythema  Lymphatics: Denies new lymphadenopathy or easy bruising Neurological:Denies tingling or new weaknesses  Behavioral/Psych: Mood  is stable, no new changes  All other systems were reviewed with the patient and are negative.  PHYSICAL EXAMINATION: ECOG PERFORMANCE STATUS: 0 - Asymptomatic  Vitals:   08/15/17 0902  BP: (!) 149/80  Pulse: 93  Resp: 16  Temp: 97.9 F (36.6 C)  SpO2: 99%   Filed Weights   08/15/17 0902  Weight: 122 lb 6.4 oz (55.5 kg)     GENERAL:alert, no distress and comfortable SKIN: skin color, texture, turgor are normal, no rashes or significant lesions EYES: normal, conjunctiva are pink and non-injected, sclera clear OROPHARYNX:no exudate, no erythema and lips, buccal mucosa, and tongue normal  NECK: supple, thyroid normal size, non-tender, without nodularity LYMPH:  no palpable cervical, supraclavicular, axillary, or inguinal lymphadenopathy  LUNGS: clear to auscultation bilaterally with normal breathing effort HEART: regular rate & rhythm and no murmurs and no lower extremity edema ABDOMEN:abdomen soft, non-tender and normal bowel sounds. No palpable hepatomegaly  Musculoskeletal:no cyanosis of digits and no clubbing  PSYCH: alert & oriented x 3 with fluent speech NEURO: no focal motor/sensory deficits BREASTS: inspection shows them to be symmetrical without nipple discharge or inversion. Right breast with (previously 4x5 cm) now 4x3.5 cm palpable mobile mass to upper outer breast with ecchymosis. Left breast with no palpable mass.  Bilateral axilla were negative for palpable adenopathy.    CBC Latest Ref Rng & Units 08/15/2017 08/02/2017 07/25/2017  WBC 3.9 - 10.3 K/uL 4.0 13.2(H) 2.6(L)  Hemoglobin 11.6 - 15.9 g/dL 10.0(L) 11.8 10.2(L)  Hematocrit 34.8 - 46.6 % 31.4(L) 35.8 31.6(L)  Platelets 145 - 400 K/uL 153 221 205   CMP Latest Ref Rng & Units 08/15/2017 08/02/2017 07/25/2017  Glucose 70 - 140 mg/dL 139 108 107  BUN 7 - 26 mg/dL _0 Creatinine 0.60 - 1.10 mg/dL 0.76 1.00 0.71  Sodium 136 - 145 mmol/L 145 139 145  Potassium 3.5 - 5.1 mmol/L 3.8 4.0 3.8  Chloride 98 - 109  mmol/L 108 103 111(H)  CO2 22 - 29 mmol/L _0 Calcium 8.4 - 10.4 mg/dL 9.1 9.0 8.8  Total Protein 6.4 - 8.3 g/dL 5.8(L) 6.1(L) 5.9(L)  Total Bilirubin 0.2 - 1.2 mg/dL 0.3 0.3 <0.2(L)  Alkaline Phos 40 - 150 U/L 104 137 104  AST 5 - 34 U/L _1 ALT 0 - 55 U/L 37 26 30     CA 27.29  05/13/17: 59.5 05/31/17: 50.0 07/04/17: 44.2 08/02/17: 49.6  Diagnosis 05/03/17  Breast, left, needle core biopsy, central, slightly toward the lower, outer quadrant - INVASIVE DUCTAL CARCINOMA, MSBR GRADE 1/2. - SEE MICROSCOPIC DESCRIPTION. ER 50% positive PR 90% positive Ki67 1% Microscopic Comment Breast prognostic profile will be performed. Called to [facility] on 05/06/2017.   Diagnosis 04/24/17  Breast, right, needle core biopsy, UOQ, centered at 9:30 o'clock - INVASIVE MAMMARY CARCINOMA. - SEE COMMENT. Microscopic Comment The carcinoma appears grade II. An E-cadherin and a breast prognostic profile will be performed and the results reported separately. The results were called to [facility] on 04/25/2017. (JBK:ecj 04/25/2017) Results: IMMUNOHISTOCHEMICAL AND MORPHOMETRIC ANALYSIS PERFORMED MANUALLY Estrogen Receptor: 100%, POSITIVE, STRONG STAINING INTENSITY Progesterone Receptor: 100%, POSITIVE, STRONG STAINING INTENSITY Proliferation Marker Ki67: 10% HER2: **POSITIVE**   PROCEDURES  Colonoscopy by Dr. Therisa Doyne 07/14/17 IMPRESSION:  - The examined portion of the ileum was normal. - The entire examined colon is normal. - The distal rectum and anal verge are normal on retroflexion view. - No specimens collected    EGD by Dr. Therisa Doyne 07/13/17 IMPRESSION: - Normal esophagus. - Z-line regular, 36 cm from the incisors. - 2 cm hiatal hernia. - Non-bleeding erosive gastropathy. - Erythematous mucosa in the prepyloric region of the stomach. - Duodenal erosions without bleeding. - No specimens collected.   ECHO 05/15/17  Impressions: -  LVEF 60-65%, normal wall thickness, normal wall motion and GLS   strain, grade 1 DD, normal LV filling pressure, mild MR, normal   LA size, normal IVC.   RADIOGRAPHIC STUDIES: I have personally reviewed the radiological images as listed and agreed with the findings in the report.  Whole Body Bone Scan 05/22/17 IMPRESSION: 1. No findings of osseous metastatic disease. 2. Lower lumbar activity corresponds to areas of significant benign spondylosis on the CT scan  CT CAP with Contrast IMPRESSION: 1. No specific CT findings of nodal or other metastatic disease in the chest, abdomen, and pelvis. 2. Bosniak category 78F cyst in the right mid kidney, with mild enhancement along a septation but without nodularity. Unless follow up abdominal scans related to the patient's breast cancer adequately characterize this lesion, renal protocol MRI or CT scan would be recommended in 6 months time. 3. Other imaging findings of potential clinical significance: The Aortic Atherosclerosis (ICD10-I70.0). Ectatic ascending thoracic aorta without aneurysm. Small type 1 hiatal hernia. Several small hypodense liver lesions are likely benign although technically too small to characterize. Nonobstructive 1.2 cm right renal calculus. Anterior uterine fundal myometrial mass favoring fibroid. Pelvic floor laxity with small cystocele. Notable spondylosis at L3-4 and L5-S1.  Diagnostic Mammogram Bilateral 05/03/17  IMPRESSION: 1. 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 2. 4 mm enhancing mass with associated architectural distortion in the central left breast. This is suspicious for additional focus of Carcinoma.  Diagnostic Mammogram, right 03/08/17 IMPRESSION: 1. Patient has a is palpable mass in the 9:30 position in the lateral right breast 2  cm from the nipple, in the location dense fibroglandular breast tissue. Although  no discrete mass is seen sonographically or mammographically, the Clinical change remains concerning. Biopsy is recommended.  Mr Breast Bilateral W Wo Contrast Inc Cad  Result Date: 08/12/2017 CLINICAL DATA:  72 year old female diagnosed with invasive lobular carcinoma of the right breast after initially presenting with a palpable lump in the right breast. Prior MRI indicated a 5.9 cm span of malignant non mass enhancement in the right breast, as well as a suspicious area of distortion in the left breast. Following workup and biopsy, this area in the left breast was found to be a grade 1-2 invasive ductal carcinoma in the central slightly lower outer quadrant. LABS:  Creatinine of 1.00 and GFR of 56 on 08/02/2017. EXAM: BILATERAL BREAST MRI WITH AND WITHOUT CONTRAST TECHNIQUE: Multiplanar, multisequence MR images of both breasts were obtained prior to and following the intravenous administration of 10 ml of MultiHance. THREE-DIMENSIONAL MR IMAGE RENDERING ON INDEPENDENT WORKSTATION: Three-dimensional MR images were rendered by post-processing of the original MR data on an independent workstation. The three-dimensional MR images were interpreted, and findings are reported in the following complete MRI report for this study. Three dimensional images were evaluated at the independent DynaCad workstation COMPARISON:  Previous exam(s). FINDINGS: Breast composition: c. Heterogeneous fibroglandular tissue. Background parenchymal enhancement: Minimal. Right breast: The non mass enhancement involving the majority of the upper-outer and lower-outer quadrants of the right breast has not significantly changed. A measure the greatest anterior to posterior span of abnormal enhancement to be 5.6 cm, previously measured at 5.9 cm. Left breast: Susceptibility artifact from the biopsy marking clip in the slightly lower outer quadrant of the left breast demonstrates stable distortion, however there is no significant associated  enhancement. The 4 mm mass previously seen at this site now is only 1 mm in size. No new left breast abnormalities are identified. Lymph nodes: No abnormal appearing lymph nodes. Ancillary findings:  None. IMPRESSION: 1. The known lobular carcinoma of the right breast has not significantly changed since the prior MRI from 05/02/2017. The disease spans approximately 5.6 cm, previously 5.9 cm in the anterior to posterior dimension. 2. 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 3.  No new suspicious findings in either breast. 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 BI-RADS CATEGORY  6: Known biopsy-proven malignancy. Electronically Signed   By: Ammie Ferrier M.D.   On: 08/12/2017 15:16    ASSESSMENT & PLAN:  72 y.o. caucasian postmenopausal woman   1.  Cancer of overlapping sites of right breast, invasive lobular carcinoma, Stage IB cT3, cN0, cM0, G2; ER positive, PR positive, HER2 positive  2.  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 In her right breast, she has large area of disease that is HER2 positive. -Due to the high risk of recurrence from right breast cancer, she would benefit from neoadjuvant or adjuvant chemotherapy. I recommended neoadjuvant chemotherapy to shrink her right breast cancer, to see if lumpectomy would be more feasible after neoadjuvant chemotherapy TCHP every 3 weeks for 6 cycles, followed by maintenance Herceptin with or without Perjeta to complete 1 year therapy. The goal of  therapy is curative. -We also reviewed that lobular carcinoma is less sensitive to chemotherapy, will monitor her disease  closely during the chemo, and likely get a interim scan to confirm her response to chemo. -Baseline CA 27.29 was 59.5, elevated   -I discussed her CT CAP and Bone scan from 05/22/17 shows no evidence of metastasis. There was incidental findings of 19F cyst in her right kidney and a non-obstructing kidney stone, will monitor.  -Baseline ECHO from 04/2017 is normal.  -She started [facility] on 05/24/17 with Onpro, tolerated moderately well with controlled diarrhea and mild fatigue and hair loss.  -she developed a significant bilateral finger joints swollen and redness, resolved with steroids. -s/p 3 cycles of TCHP she experienced GI hemorrhaging with diarrhea and was admitted to hospital on 07/12/17. Her HG dropped to 5.7 and HCT at 17.4. Inpatient colonoscopy and EGD showed no signs of bleeding. She was given a blood transfusion on 07/15/17 and counts recovered.  -We discussed her Breast MRI from 08/10/17 which showed no significant response to current neoadjuvant treatment.  -I discussed her lobular and ER/PR positive disease is overall less sensitive to chemo. She is s/p 4 cycles of TCHP which holds the most benefit of the treatment. More chemo at this point will not shrink her tumor and will increase risk of more side effects. Will cancel her last two cycle chemo and she will stop her decadron.  -I spoke with Dr. Prince Solian about having surgery as soon as possible, he agrees and will schedule.  -In interim, I discussed the option of starting antiestrogen therapy with Letrozole sooner for a total of 7-10 years and to continue Anti-Her2 treatment with Herceptin and Perjeta every 3 weeks to complete her 1 year of treatment.  -The potential benefit and side effects, which includes but not limited to, hot flash, skin and vaginal dryness, metabolic changes ( increased blood glucose, cholesterol, weight, etc.), slightly in increased risk of cardiovascular disease, cataracts, muscular and joint discomfort, osteopenia and  osteoporosis, etc, were discussed with her in great details. She is interested, and she will start this week, and continue through surgery and radiation. I provided her with reading material on Letrozole and discussed side effects including bone pain, lowered bone density, hot flashes and mood swings. She is agreeable.  -She is due for repeat ECHO next month, will refer her to cardiology  -Labs reviewed today, her HG 10, no need for blood transfusion. WBC and plt normal. -F/u in 3 weeks   2. Genetics  -I reached out to genetics counselor to see if patient qualifies for genetics referral, she has synchronous bilateral ductal and lobular carcinoma, her brother has prostate cancer and her mother had uterine cancer in her 25's. I will follow up.  -I will follow up with Genetics   3. Bosniak category 19F cyst in the right mid kidney, 1.2cm Non-obstructive kidney stone -found on 05/22/17 CT Scan  -Will continue to monitor.    4. Borderline DM,  secondary to steroids, yeast infection symptoms -I encouraged her to increase water intake and watch her diet -will copy note to Dr. Kenton Kingfisher to provide her with  more information of diabetic education. -Dietician consult was set up to help pt  -Yeast infection was treated with one dose of fluconazole given 05/23/17.  -she developed hyperglycemia with BG 224 with 1st cycle. Reduced her Decadron to 4 mg BID for 2 days prior to treatment then once daily for 2 days after treatment. -HB1c is 6.4 as of  05/31/17   -I previously reviewed what signs of hyperglycemia to look for and encouraged her to monitor her BG at home.  -I previously prescribed Metformin on 07/04/17 for her to take until she completes chemotherapy. I discussed the side effects of Metformin, including diarrhea and I suggested she speak to her PCP about this and they will monitor. -Currently has better controlled BG -She will stop decadron with discontinuation of chemo.   5. Anemia, secondary to  chemotherapy, and history of GI bleeding in 06/2017 - Initially she was asymptomatic with no need for blood transfusion -She experienced significant GI bleeding with HG of 5.7 and HCT of 17.4 and was hospitalized on 07/12/17. GI workup showed no site of bleeding. She received blood transfusion on 07/15/17.  -Hg recovered and showed good response to blood transfusion.  -continue PPI  -Hg at 10.0 today (08/15/2017), no need for blood transfusion.    6. Bone Health  -With start of Letrozole I discussed the side effect of weak bone from letrozole   -Will request bone density scan sometime after her surgery.    PLAN:  -Prescribe Letrozole today to start once daily tomorrow  -Herceptin and Perjeta Tomorrow, stop chemo due to her poor response to chemo  -Dr. Georgette Dover will schedule her breast surgery soon   -Lab, flush, f/u and Herceptin and Perjeta in 3 weeks      No orders of the defined types were placed in this encounter.   All questions were answered. The patient knows to call the clinic with any problems, questions or concerns.   [name], MD 08/15/2017   This document serves as a record of services personally performed by [name], MD. It was created on her behalf by Joslyn Devon, a trained medical scribe. The creation of this record is based on the scribe's personal observations and the provider's statements to them.    I have reviewed the above documentation for accuracy and completeness, and I agree with the above.

## 2017-08-15 ENCOUNTER — Telehealth: Payer: Self-pay | Admitting: Hematology

## 2017-08-15 ENCOUNTER — Encounter: Payer: Self-pay | Admitting: Hematology

## 2017-08-15 ENCOUNTER — Inpatient Hospital Stay (HOSPITAL_BASED_OUTPATIENT_CLINIC_OR_DEPARTMENT_OTHER): Payer: Medicare Other | Admitting: Hematology

## 2017-08-15 ENCOUNTER — Inpatient Hospital Stay: Payer: Medicare Other

## 2017-08-15 VITALS — BP 149/80 | HR 93 | Temp 97.9°F | Resp 16 | Ht 62.0 in | Wt 122.4 lb

## 2017-08-15 DIAGNOSIS — Z5111 Encounter for antineoplastic chemotherapy: Secondary | ICD-10-CM | POA: Diagnosis not present

## 2017-08-15 DIAGNOSIS — C50112 Malignant neoplasm of central portion of left female breast: Secondary | ICD-10-CM

## 2017-08-15 DIAGNOSIS — D6481 Anemia due to antineoplastic chemotherapy: Secondary | ICD-10-CM | POA: Diagnosis not present

## 2017-08-15 DIAGNOSIS — Z5112 Encounter for antineoplastic immunotherapy: Secondary | ICD-10-CM | POA: Diagnosis not present

## 2017-08-15 DIAGNOSIS — C50811 Malignant neoplasm of overlapping sites of right female breast: Secondary | ICD-10-CM

## 2017-08-15 DIAGNOSIS — Z170 Estrogen receptor positive status [ER+]: Secondary | ICD-10-CM | POA: Diagnosis not present

## 2017-08-15 LAB — CBC WITH DIFFERENTIAL/PLATELET
Basophils Absolute: 0 10*3/uL (ref 0.0–0.1)
Basophils Relative: 1 %
Eosinophils Absolute: 0 10*3/uL (ref 0.0–0.5)
Eosinophils Relative: 0 %
HCT: 31.4 % — ABNORMAL LOW (ref 34.8–46.6)
Hemoglobin: 10 g/dL — ABNORMAL LOW (ref 11.6–15.9)
Lymphocytes Relative: 16 %
Lymphs Abs: 0.6 10*3/uL — ABNORMAL LOW (ref 0.9–3.3)
MCH: 31.6 pg (ref 25.1–34.0)
MCHC: 31.8 g/dL (ref 31.5–36.0)
MCV: 99.4 fL (ref 79.5–101.0)
Monocytes Absolute: 0.3 10*3/uL (ref 0.1–0.9)
Monocytes Relative: 8 %
Neutro Abs: 3.1 10*3/uL (ref 1.5–6.5)
Neutrophils Relative %: 75 %
Platelets: 153 10*3/uL (ref 145–400)
RBC: 3.16 MIL/uL — ABNORMAL LOW (ref 3.70–5.45)
RDW: 17.4 % — ABNORMAL HIGH (ref 11.2–14.5)
WBC: 4 10*3/uL (ref 3.9–10.3)

## 2017-08-15 LAB — COMPREHENSIVE METABOLIC PANEL
ALT: 37 U/L (ref 0–55)
AST: 29 U/L (ref 5–34)
Albumin: 3.3 g/dL — ABNORMAL LOW (ref 3.5–5.0)
Alkaline Phosphatase: 104 U/L (ref 40–150)
Anion gap: 9 (ref 3–11)
BUN: 12 mg/dL (ref 7–26)
CO2: 28 mmol/L (ref 22–29)
Calcium: 9.1 mg/dL (ref 8.4–10.4)
Chloride: 108 mmol/L (ref 98–109)
Creatinine, Ser: 0.76 mg/dL (ref 0.60–1.10)
GFR calc Af Amer: >60 mL/min (ref >60)
GFR calc non Af Amer: >60 mL/min (ref >60)
Glucose, Bld: 139 mg/dL (ref 70–140)
Potassium: 3.8 mmol/L (ref 3.5–5.1)
Sodium: 145 mmol/L (ref 136–145)
Total Bilirubin: 0.3 mg/dL (ref 0.2–1.2)
Total Protein: 5.8 g/dL — ABNORMAL LOW (ref 6.4–8.3)

## 2017-08-15 MED ORDER — SODIUM CHLORIDE 0.9% FLUSH
10.0000 mL | Freq: Once | INTRAVENOUS | Status: AC
  Administered 2017-08-15: 10 mL
  Filled 2017-08-15: qty 10

## 2017-08-15 MED ORDER — LETROZOLE 2.5 MG PO TABS: 2.5000 mg | ORAL_TABLET | Freq: Every day | ORAL | 3 refills | Status: DC

## 2017-08-15 MED ORDER — HEPARIN SOD (PORK) LOCK FLUSH 100 UNIT/ML IV SOLN
500.0000 [IU] | Freq: Once | INTRAVENOUS | Status: AC
  Administered 2017-08-15: 500 [IU]
  Filled 2017-08-15: qty 5

## 2017-08-15 NOTE — Telephone Encounter (Signed)
Scheduled appt per 2/21 los - no los in - per YF verbal orders: lab/flush/ f/u and herceptin/ prejeta in 3 wks. -  Patient to get an updated schedule 2/22 with treatment

## 2017-08-16 ENCOUNTER — Ambulatory Visit: Payer: Self-pay | Admitting: Surgery

## 2017-08-16 ENCOUNTER — Inpatient Hospital Stay: Payer: Medicare Other

## 2017-08-16 VITALS — BP 139/88 | HR 89 | Temp 98.5°F | Resp 16

## 2017-08-16 DIAGNOSIS — Z170 Estrogen receptor positive status [ER+]: Secondary | ICD-10-CM

## 2017-08-16 DIAGNOSIS — C50912 Malignant neoplasm of unspecified site of left female breast: Secondary | ICD-10-CM | POA: Diagnosis not present

## 2017-08-16 DIAGNOSIS — Z5112 Encounter for antineoplastic immunotherapy: Secondary | ICD-10-CM | POA: Diagnosis not present

## 2017-08-16 DIAGNOSIS — C50811 Malignant neoplasm of overlapping sites of right female breast: Secondary | ICD-10-CM | POA: Diagnosis not present

## 2017-08-16 DIAGNOSIS — Z5111 Encounter for antineoplastic chemotherapy: Secondary | ICD-10-CM | POA: Diagnosis not present

## 2017-08-16 DIAGNOSIS — C50112 Malignant neoplasm of central portion of left female breast: Secondary | ICD-10-CM | POA: Diagnosis not present

## 2017-08-16 DIAGNOSIS — C50919 Malignant neoplasm of unspecified site of unspecified female breast: Secondary | ICD-10-CM | POA: Diagnosis not present

## 2017-08-16 DIAGNOSIS — C50911 Malignant neoplasm of unspecified site of right female breast: Secondary | ICD-10-CM

## 2017-08-16 MED ORDER — DIPHENHYDRAMINE HCL 25 MG PO CAPS
ORAL_CAPSULE | ORAL | Status: AC
  Filled 2017-08-16: qty 2

## 2017-08-16 MED ORDER — DIPHENHYDRAMINE HCL 25 MG PO CAPS
50.0000 mg | ORAL_CAPSULE | Freq: Once | ORAL | Status: AC
  Administered 2017-08-16: 50 mg via ORAL

## 2017-08-16 MED ORDER — SODIUM CHLORIDE 0.9% FLUSH
10.0000 mL | INTRAVENOUS | Status: DC | PRN
  Administered 2017-08-16: 10 mL
  Filled 2017-08-16: qty 10

## 2017-08-16 MED ORDER — ACETAMINOPHEN 325 MG PO TABS
ORAL_TABLET | ORAL | Status: AC
  Filled 2017-08-16: qty 2

## 2017-08-16 MED ORDER — HEPARIN SOD (PORK) LOCK FLUSH 100 UNIT/ML IV SOLN
500.0000 [IU] | Freq: Once | INTRAVENOUS | Status: AC | PRN
  Administered 2017-08-16: 500 [IU]
  Filled 2017-08-16: qty 5

## 2017-08-16 MED ORDER — ACETAMINOPHEN 325 MG PO TABS
650.0000 mg | ORAL_TABLET | Freq: Once | ORAL | Status: AC
  Administered 2017-08-16: 650 mg via ORAL

## 2017-08-16 MED ORDER — SODIUM CHLORIDE 0.9 % IV SOLN
420.0000 mg | Freq: Once | INTRAVENOUS | Status: AC
  Administered 2017-08-16: 420 mg via INTRAVENOUS
  Filled 2017-08-16: qty 14

## 2017-08-16 MED ORDER — SODIUM CHLORIDE 0.9 % IV SOLN
Freq: Once | INTRAVENOUS | Status: AC
  Administered 2017-08-16: 08:00:00 via INTRAVENOUS

## 2017-08-16 MED ORDER — TRASTUZUMAB CHEMO 150 MG IV SOLR
6.0000 mg/kg | Freq: Once | INTRAVENOUS | Status: AC
  Administered 2017-08-16: 336 mg via INTRAVENOUS
  Filled 2017-08-16: qty 16

## 2017-08-16 NOTE — Patient Instructions (Signed)
[facility] Discharge Instructions for Patients Receiving Chemotherapy  Today you received the following chemotherapy agents:  Herceptin and Perjeta.  To help prevent nausea and vomiting after your treatment, we encourage you to take your nausea medication as directed.   If you develop nausea and vomiting that is not controlled by your nausea medication, call the clinic.   BELOW ARE SYMPTOMS THAT SHOULD BE REPORTED IMMEDIATELY:  *FEVER GREATER THAN 100.5 F  *CHILLS WITH OR WITHOUT FEVER  NAUSEA AND VOMITING THAT IS NOT CONTROLLED WITH YOUR NAUSEA MEDICATION  *UNUSUAL SHORTNESS OF BREATH  *UNUSUAL BRUISING OR BLEEDING  TENDERNESS IN MOUTH AND THROAT WITH OR WITHOUT PRESENCE OF ULCERS  *URINARY PROBLEMS  *BOWEL PROBLEMS  UNUSUAL RASH Items with * indicate a potential emergency and should be followed up as soon as possible.  Feel free to call the clinic should you have any questions or concerns. The clinic phone number is [phone].  Please show the CHEMO ALERT CARD at check-in to the Emergency Department and triage nurse.  Trastuzumab injection for infusion What is this medicine? TRASTUZUMAB (tras TOO zoo mab) is a monoclonal antibody. It is used to treat breast cancer and stomach cancer. This medicine may be used for other purposes; ask your health care provider or pharmacist if you have questions. COMMON BRAND NAME(S): Herceptin What should I tell my health care provider before I take this medicine? They need to know if you have any of these conditions: -heart disease -heart failure -lung or breathing disease, like asthma -an unusual or allergic reaction to trastuzumab, benzyl alcohol, or other medications, foods, dyes, or preservatives -pregnant or trying to get pregnant -breast-feeding How should I use this medicine? This drug is given as an infusion into a vein. It is administered in a hospital or clinic by a specially trained health care  professional. Talk to your pediatrician regarding the use of this medicine in children. This medicine is not approved for use in children. Overdosage: If you think you have taken too much of this medicine contact a poison control center or emergency room at once. NOTE: This medicine is only for you. Do not share this medicine with others. What if I miss a dose? It is important not to miss a dose. Call your doctor or health care professional if you are unable to keep an appointment. What may interact with this medicine? This medicine may interact with the following medications: -certain types of chemotherapy, such as daunorubicin, doxorubicin, epirubicin, and idarubicin This list may not describe all possible interactions. Give your health care provider a list of all the medicines, herbs, non-prescription drugs, or dietary supplements you use. Also tell them if you smoke, drink alcohol, or use illegal drugs. Some items may interact with your medicine. What should I watch for while using this medicine? Visit your doctor for checks on your progress. Report any side effects. Continue your course of treatment even though you feel ill unless your doctor tells you to stop. Call your doctor or health care professional for advice if you get a fever, chills or sore throat, or other symptoms of a cold or flu. Do not treat yourself. Try to avoid being around people who are sick. You may experience fever, chills and shaking during your first infusion. These effects are usually mild and can be treated with other medicines. Report any side effects during the infusion to your health care professional. Fever and chills usually do not happen with later infusions. Do not   become pregnant while taking this medicine or for 7 months after stopping it. Women should inform their doctor if they wish to become pregnant or think they might be pregnant. Women of child-bearing potential will need to have a negative pregnancy test  before starting this medicine. There is a potential for serious side effects to an unborn child. Talk to your health care professional or pharmacist for more information. Do not breast-feed an infant while taking this medicine or for 7 months after stopping it. Women must use effective birth control with this medicine. What side effects may I notice from receiving this medicine? Side effects that you should report to your doctor or health care professional as soon as possible: -allergic reactions like skin rash, itching or hives, swelling of the face, lips, or tongue -chest pain or palpitations -cough -dizziness -feeling faint or lightheaded, falls -fever -general ill feeling or flu-like symptoms -signs of worsening heart failure like breathing problems; swelling in your legs and feet -unusually weak or tired Side effects that usually do not require medical attention (report to your doctor or health care professional if they continue or are bothersome): -bone pain -changes in taste -diarrhea -joint pain -nausea/vomiting -weight loss This list may not describe all possible side effects. Call your doctor for medical advice about side effects. You may report side effects to FDA at 1-800-FDA-1088. Where should I keep my medicine? This drug is given in a hospital or clinic and will not be stored at home. NOTE: This sheet is a summary. It may not cover all possible information. If you have questions about this medicine, talk to your doctor, pharmacist, or health care provider.  2018 [employer] Standard (2016-06-05 14:37:52)  Pertuzumab injection What is this medicine? PERTUZUMAB (per TOOZ ue mab) is a monoclonal antibody. It is used to treat breast cancer. This medicine may be used for other purposes; ask your health care provider or pharmacist if you have questions. COMMON BRAND NAME(S): PERJETA What should I tell my health care provider before I take this medicine? They need to know  if you have any of these conditions: -heart disease -heart failure -high blood pressure -history of irregular heart beat -recent or ongoing radiation therapy -an unusual or allergic reaction to pertuzumab, other medicines, foods, dyes, or preservatives -pregnant or trying to get pregnant -breast-feeding How should I use this medicine? This medicine is for infusion into a vein. It is given by a health care professional in a hospital or clinic setting. Talk to your pediatrician regarding the use of this medicine in children. Special care may be needed. Overdosage: If you think you have taken too much of this medicine contact a poison control center or emergency room at once. NOTE: This medicine is only for you. Do not share this medicine with others. What if I miss a dose? It is important not to miss your dose. Call your doctor or health care professional if you are unable to keep an appointment. What may interact with this medicine? Interactions are not expected. Give your health care provider a list of all the medicines, herbs, non-prescription drugs, or dietary supplements you use. Also tell them if you smoke, drink alcohol, or use illegal drugs. Some items may interact with your medicine. This list may not describe all possible interactions. Give your health care provider a list of all the medicines, herbs, non-prescription drugs, or dietary supplements you use. Also tell them if you smoke, drink alcohol, or use illegal drugs. Some items may interact   with your medicine. What should I watch for while using this medicine? Your condition will be monitored carefully while you are receiving this medicine. Report any side effects. Continue your course of treatment even though you feel ill unless your doctor tells you to stop. Do not become pregnant while taking this medicine or for 7 months after stopping it. Women should inform their doctor if they wish to become pregnant or think they might be  pregnant. Women of child-bearing potential will need to have a negative pregnancy test before starting this medicine. There is a potential for serious side effects to an unborn child. Talk to your health care professional or pharmacist for more information. Do not breast-feed an infant while taking this medicine or for 7 months after stopping it. Women must use effective birth control with this medicine. Call your doctor or health care professional for advice if you get a fever, chills or sore throat, or other symptoms of a cold or flu. Do not treat yourself. Try to avoid being around people who are sick. You may experience fever, chills, and headache during the infusion. Report any side effects during the infusion to your health care professional. What side effects may I notice from receiving this medicine? Side effects that you should report to your doctor or health care professional as soon as possible: -breathing problems -chest pain or palpitations -dizziness -feeling faint or lightheaded -fever or chills -skin rash, itching or hives -sore throat -swelling of the face, lips, or tongue -swelling of the legs or ankles -unusually weak or tired Side effects that usually do not require medical attention (report to your doctor or health care professional if they continue or are bothersome): -diarrhea -hair loss -nausea, vomiting -tiredness This list may not describe all possible side effects. Call your doctor for medical advice about side effects. You may report side effects to FDA at 1-800-FDA-1088. Where should I keep my medicine? This drug is given in a hospital or clinic and will not be stored at home. NOTE: This sheet is a summary. It may not cover all possible information. If you have questions about this medicine, talk to your doctor, pharmacist, or health care provider.  2018 [employer]vier/Gold Standard (2015-07-14 12:08:50)    

## 2017-08-16 NOTE — H&P (Signed)
History of Present Illness Madison Stokes. Madison Faivre Madison Stokes; 08/16/2017 12:44 PM) The patient is a 72 year old female who presents with breast cancer. PCP - Dr. [name]/ Madison Stokes Referred by Dr. Autumn Stokes for right breast cancer  This is a pleasant 72 yo female accompanied by husband and daughter-in-law (Nurse at [employer]) who was recently diagnosed with breast cancer. She had normal mammogram 05/02/16. In September, 2018, she palpated a large mass in the outer part of her right breast. She was seen by Dr. Dema Stokes, who referred her for mammogram and ultrasound on 03/08/17. This showed no discrete masses, distortion, or asymmetry. Ultrasound also showed no discrete mass and ultrasound was negative. She underwent biopsy on 04/24/17, which returned a preliminary diagnosis of invasive mammary carcinoma, likely lobular. ER/ PR +, Her +.  I ordered a breast MRI which revealed suspicious findings on the left. This was biopsied and showed invasive ductal carcinoma ER/ PR positive, Her 2 negative. Because of her bilateral breast cancer she was referred to oncology. She has selected neoadjuvant chemotherapy. A port was placed on 05/14/17. She had a recent MRI which showed minimal change in the right invasive lobular carcinoma. The smaller left invasive ductal carcinoma has decreased from 4 mm to 1 mm in size. She presents now to discuss surgery. She was hospitalized in January for bloody stool, but EGD and colonoscopy did not show any source of bleeding. She has not had any further bleeding episodes.  CLINICAL DATA: 72 year old female diagnosed with invasive lobular carcinoma of the right breast after initially presenting with a palpable lump in the right breast. Prior MRI indicated a 5.9 cm span of malignant non mass enhancement in the right breast, as well as a suspicious area of distortion in the left breast. Following workup and biopsy, this area in the left breast was found to be a grade 1-2 invasive  ductal carcinoma in the central slightly lower outer quadrant.  LABS: Creatinine of 1.00 and GFR of 56 on 08/02/2017.  EXAM: BILATERAL BREAST MRI WITH AND WITHOUT CONTRAST  TECHNIQUE: Multiplanar, multisequence MR images of both breasts were obtained prior to and following the intravenous administration of 10 ml of MultiHance.  THREE-DIMENSIONAL MR IMAGE RENDERING ON INDEPENDENT WORKSTATION:  Three-dimensional MR images were rendered by post-processing of the original MR data on an independent workstation. The three-dimensional MR images were interpreted, and findings are reported in the following complete MRI report for this study. Three dimensional images were evaluated at the independent DynaCad workstation  COMPARISON: Previous exam(s).  FINDINGS: Breast composition: c. Heterogeneous fibroglandular tissue.  Background parenchymal enhancement: Minimal.  Right breast: The non mass enhancement involving the majority of the upper-outer and lower-outer quadrants of the right breast has not significantly changed. A measure the greatest anterior to posterior span of abnormal enhancement to be 5.6 cm, previously measured at 5.9 cm.  Left breast: Susceptibility artifact from the biopsy marking clip in the slightly lower outer quadrant of the left breast demonstrates stable distortion, however there is no significant associated enhancement. The 4 mm mass previously seen at this site now is only 1 mm in size. No new left breast abnormalities are identified.  Lymph nodes: No abnormal appearing lymph nodes.  Ancillary findings: None.  IMPRESSION: 1. The known lobular carcinoma of the right breast has not significantly changed since the prior MRI from 05/02/2017. The disease spans approximately 5.6 cm, previously 5.9 cm in the anterior to posterior dimension.  2. 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  3. No new suspicious findings in either breast.  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  BI-RADS CATEGORY 6: Known biopsy-proven malignancy.   Electronically Signed By: Madison Stokes M.D. On: 08/12/2017 15:16  Menarche - 15 First pregnancy - 28 Breastfeed - no OCP - 10 years Menopause - age 48 FH - negative Menarche - 65 First pregnancy - 50 Breastfeed - no OCP - 10 years Menopause - age 53 FH - negative  CLINICAL DATA: Patient presents with a palpable lump along the lateral aspect the right breast. Patient believes that this is a new lies. It is nontender.  EXAM: 2D DIGITAL DIAGNOSTIC RIGHT MAMMOGRAM WITH CAD AND ADJUNCT TOMO  ULTRASOUND RIGHT BREAST  COMPARISON: Previous exam(s).  ACR Breast Density Category c: The breast tissue is heterogeneously dense, which may obscure small masses.  FINDINGS: There are no discrete masses, areas of architectural distortion, areas of significant asymmetry or suspicious calcifications. No mammographic change.  Mammographic images were processed with CAD.  On physical exam, there is a firm but mobile palpable abnormality in the lateral right breast approximately 2-3 cm from the nipple, extending towards the upper-outer quadrant.  Targeted ultrasound is performed, showing heterogeneous fibroglandular tissue centered on the 9:30 o'clock position of the right breast, 2 cm the nipple. There is no discrete mass.  Sonographic evaluation the right axilla shows no enlarged or abnormal lymph nodes.  IMPRESSION: 1. Patient has a is palpable mass in the lateral right breast, in the location dense fibroglandular breast tissue. Although no discrete mass is seen sonographically or mammographically, the Clinical change remains concerning. Biopsy is  recommended.  RECOMMENDATION: 1. Ultrasound-guided Stokes needle biopsy of palpable abnormality in the lateral right breast.  I have discussed the findings and recommendations with the patient. Results were also provided in writing at the conclusion of the visit. If applicable, a reminder letter will be sent to the patient regarding the next appointment.  BI-RADS CATEGORY 4: Suspicious.   Electronically Signed By: Madison Stokes M.D. On: 03/08/2017 11:24  CLINICAL DATA: Patient presents for ultrasound-guided Stokes needle biopsy of a new palpable lump in the lateral to upper outer aspect of the right breast. On the diagnostic images, there was an ill-defined area of hypoechogenicity corresponding to the palpable lump.  EXAM: ULTRASOUND GUIDED RIGHT BREAST Stokes NEEDLE BIOPSY  COMPARISON: Previous exam(s).  FINDINGS: I met with the patient and we discussed the procedure of ultrasound-guided biopsy, including benefits and alternatives. We discussed the high likelihood of a successful procedure. We discussed the risks of the procedure, including infection, bleeding, tissue injury, clip migration, and inadequate sampling. Informed written consent was given. The usual time-out protocol was performed immediately prior to the procedure.  Lesion quadrant: Upper outer quadrant  Using sterile technique and 1% Lidocaine as local anesthetic, under direct ultrasound visualization, a 12 gauge spring-loaded device was used to perform biopsy of the palpable lump and corresponding ill-defined area of hypoechogenicity in the upper outer quadrant, centered at 9:30 o'clock, using an inferior approach. At the conclusion of the procedure a ribbon shaped tissue marker clip was deployed into the biopsy cavity. Follow up 2 view mammogram was performed and dictated separately.  IMPRESSION: Ultrasound guided biopsy of the right breast. No apparent complications.  Electronically Signed: By:  Madison Stokes M.D. 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 This was found to be concordant by Dr. Lajean Stokes. Pathology results were discussed with the patient by telephone. The patient reported doing well after the biopsy with tenderness at the site. Post biopsy instructions and care were reviewed and questions were answered. The patient was encouraged to call [facility] for any additional concerns. Surgical consultation has been arranged with Dr. Donnie Stokes at [facility] on April 29, 2017.  Pathology results reported by Madison Purser, RN on 04/25/2017.   Electronically Signed By: Madison Stokes M.D. On: 04/25/2017 14:36    Problem List/Past Medical Madison Key K. Letasha Kershaw, Madison Stokes; 08/16/2017 12:44 PM) INVASIVE LOBULAR CARCINOMA OF BREAST IN FEMALE (C50.919)  Diagnostic Studies History (Madison Whilden K. Ludwig Tugwell, Madison Stokes; 08/16/2017 12:44 PM) Mammogram within last year Pap Smear 1-5 years ago  Allergies Madison Key K. Kamdon Reisig, Madison Stokes; 08/16/2017 12:44 PM) No Known Drug Allergies [04/29/2017]:  Medication History Madison Stokes, Madison Stokes; 08/16/2017 11:17 AM) Letrozole (2.5MG Tablet, Oral) Active. Claritin (10MG Tablet, Oral) Active. MetFORMIN HCl (500MG Tablet, Oral) Active. Loperamide HCl (2MG Tablet, Oral) Active. Fluconazole (100MG Tablet, Oral) Active. Lidocaine-Prilocaine (2.5-2.5% Cream, External) Active. Prochlorperazine Maleate (10MG Tablet, Oral) Active. Irbesartan (150MG Tablet, Oral) Active. Simvastatin (40MG Tablet, Oral) Active. Multi-Day (Oral) Active. Aspir-Low (81MG Tablet DR, Oral) Active. Medications Reconciled  Social History Madison Stokes. Madison Brow, Madison Stokes; 08/16/2017 12:44 PM) Alcohol use Occasional alcohol use. Caffeine use Coffee. No drug use Tobacco use Never smoker.  Family History Madison Stokes. Cuca Benassi, Madison Stokes;  08/16/2017 12:44 PM) Cerebrovascular Accident Father. Diabetes Mellitus Brother, Father, Mother. Heart Disease Father. Hypertension Brother, Father, Mother. Kidney Disease Brother, Mother.  Pregnancy / Birth History Madison Stokes. Lynlee Stratton, Madison Stokes; 08/16/2017 12:44 PM) Age at menarche 51 years. Age of menopause 70-55 Gravida 2 Maternal age 74-30 Para 2  Other Problems Madison Stokes. Krisalyn Yankowski, Madison Stokes; 08/16/2017 12:44 PM) High blood pressure Hypercholesterolemia Lump In Breast    Vitals (Armen Glenn Madison Stokes; 08/16/2017 11:16 AM) 08/16/2017 11:15 AM Weight: 124.38 lb Height: 62in Body Surface Area: 1.56 m Body Mass Index: 22.75 kg/m  Temp.: 98.4F  Pulse: 94 (Regular)  P.OX: 96% (Room air) BP: 146/90 (Sitting, Left Arm, Standard)      Physical Exam Madison Key K. Caitlynne Harbeck Madison Stokes; 08/16/2017 12:45 PM)  The physical exam findings are as follows: Note:WDWN in NAD Eyes: Pupils equal, round; sclera anicteric HENT: Oral mucosa moist; good dentition Neck: No masses palpated, no thyromegaly Lungs: CTA bilaterally; normal respiratory effort Breasts: left breast - no nipple changes; no palpable masses; no axillary lymphadenopathy right breast - no nipple retraction; no axillary lymphadenopathy Palpable 5 cm area of firmness in the lateral right breast at 9:00 CV: Regular rate and rhythm; no murmurs; extremities well-perfused with no edema Abd: +bowel sounds, soft, non-tender, no palpable organomegaly; no palpable hernias Skin: Warm, dry; no sign of jaundice Psychiatric - alert and oriented x 4; calm mood and affect    Assessment & Plan Madison Key K. 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 The surgical procedure has been discussed with the patient. Potential risks, benefits, alternative treatments, and expected outcomes have been explained. All  of the patient's questions at this time have been answered. The likelihood of reaching the patient's treatment goal is good. The patient understand the proposed surgical procedure and wishes to proceed.  Madison Stokes. Georgette Dover, Madison Stokes, [facility]  General/ Trauma Surgery  08/16/2017 12:45 PM

## 2017-08-16 NOTE — H&P (View-Only) (Signed)
History of Present Illness Madison Stokes. Madison Gales MD; 08/16/2017 12:44 PM) The patient is a 72 year old female who presents with breast cancer. PCP - Dr. [name]/ [name] Referred by Dr. [name] for right breast cancer  This is a pleasant 72 yo female accompanied by husband and daughter-in-law (Nurse at [employer]) who was recently diagnosed with breast cancer. She had normal mammogram 05/02/16. In September, 2018, she palpated a large mass in the outer part of her right breast. She was seen by Dr. Dema Severin, who referred her for mammogram and ultrasound on 03/08/17. This showed no discrete masses, distortion, or asymmetry. Ultrasound also showed no discrete mass and ultrasound was negative. She underwent biopsy on 04/24/17, which returned a preliminary diagnosis of invasive mammary carcinoma, likely lobular. ER/ PR +, Her +.  I ordered a breast MRI which revealed suspicious findings on the left. This was biopsied and showed invasive ductal carcinoma ER/ PR positive, Her 2 negative. Because of her bilateral breast cancer she was referred to oncology. She has selected neoadjuvant chemotherapy. A port was placed on 05/14/17. She had a recent MRI which showed minimal change in the right invasive lobular carcinoma. The smaller left invasive ductal carcinoma has decreased from 4 mm to 1 mm in size. She presents now to discuss surgery. She was hospitalized in January for bloody stool, but EGD and colonoscopy did not show any source of bleeding. She has not had any further bleeding episodes.  CLINICAL DATA: 72 year old female diagnosed with invasive lobular carcinoma of the right breast after initially presenting with a palpable lump in the right breast. Prior MRI indicated a 5.9 cm span of malignant non mass enhancement in the right breast, as well as a suspicious area of distortion in the left breast. Following workup and biopsy, this area in the left breast was found to be a grade 1-2 invasive  ductal carcinoma in the central slightly lower outer quadrant.  LABS: Creatinine of 1.00 and GFR of 56 on 08/02/2017.  EXAM: BILATERAL BREAST MRI WITH AND WITHOUT CONTRAST  TECHNIQUE: Multiplanar, multisequence MR images of both breasts were obtained prior to and following the intravenous administration of 10 ml of MultiHance.  THREE-DIMENSIONAL MR IMAGE RENDERING ON INDEPENDENT WORKSTATION:  Three-dimensional MR images were rendered by post-processing of the original MR data on an independent workstation. The three-dimensional MR images were interpreted, and findings are reported in the following complete MRI report for this study. Three dimensional images were evaluated at the independent DynaCad workstation  COMPARISON: Previous exam(s).  FINDINGS: Breast composition: c. Heterogeneous fibroglandular tissue.  Background parenchymal enhancement: Minimal.  Right breast: The non mass enhancement involving the majority of the upper-outer and lower-outer quadrants of the right breast has not significantly changed. A measure the greatest anterior to posterior span of abnormal enhancement to be 5.6 cm, previously measured at 5.9 cm.  Left breast: Susceptibility artifact from the biopsy marking clip in the slightly lower outer quadrant of the left breast demonstrates stable distortion, however there is no significant associated enhancement. The 4 mm mass previously seen at this site now is only 1 mm in size. No new left breast abnormalities are identified.  Lymph nodes: No abnormal appearing lymph nodes.  Ancillary findings: None.  IMPRESSION: 1. The known lobular carcinoma of the right breast has not significantly changed since the prior MRI from 05/02/2017. The disease spans approximately 5.6 cm, previously 5.9 cm in the anterior to posterior dimension.  2. 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  3. No new suspicious findings in either breast.  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  BI-RADS CATEGORY 6: Known biopsy-proven malignancy.   Electronically Signed By: Ammie Ferrier M.D. On: 08/12/2017 15:16  Menarche - 15 First pregnancy - 28 Breastfeed - no OCP - 10 years Menopause - age 48 FH - negative Menarche - 65 First pregnancy - 50 Breastfeed - no OCP - 10 years Menopause - age 53 FH - negative  CLINICAL DATA: Patient presents with a palpable lump along the lateral aspect the right breast. Patient believes that this is a new lies. It is nontender.  EXAM: 2D DIGITAL DIAGNOSTIC RIGHT MAMMOGRAM WITH CAD AND ADJUNCT TOMO  ULTRASOUND RIGHT BREAST  COMPARISON: Previous exam(s).  ACR Breast Density Category c: The breast tissue is heterogeneously dense, which may obscure small masses.  FINDINGS: There are no discrete masses, areas of architectural distortion, areas of significant asymmetry or suspicious calcifications. No mammographic change.  Mammographic images were processed with CAD.  On physical exam, there is a firm but mobile palpable abnormality in the lateral right breast approximately 2-3 cm from the nipple, extending towards the upper-outer quadrant.  Targeted ultrasound is performed, showing heterogeneous fibroglandular tissue centered on the 9:30 o'clock position of the right breast, 2 cm the nipple. There is no discrete mass.  Sonographic evaluation the right axilla shows no enlarged or abnormal lymph nodes.  IMPRESSION: 1. Patient has a is palpable mass in the lateral right breast, in the location dense fibroglandular breast tissue. Although no discrete mass is seen sonographically or mammographically, the Clinical change remains concerning. Biopsy is  recommended.  RECOMMENDATION: 1. Ultrasound-guided core needle biopsy of palpable abnormality in the lateral right breast.  I have discussed the findings and recommendations with the patient. Results were also provided in writing at the conclusion of the visit. If applicable, a reminder letter will be sent to the patient regarding the next appointment.  BI-RADS CATEGORY 4: Suspicious.   Electronically Signed By: Lajean Manes M.D. On: 03/08/2017 11:24  CLINICAL DATA: Patient presents for ultrasound-guided core needle biopsy of a new palpable lump in the lateral to upper outer aspect of the right breast. On the diagnostic images, there was an ill-defined area of hypoechogenicity corresponding to the palpable lump.  EXAM: ULTRASOUND GUIDED RIGHT BREAST CORE NEEDLE BIOPSY  COMPARISON: Previous exam(s).  FINDINGS: I met with the patient and we discussed the procedure of ultrasound-guided biopsy, including benefits and alternatives. We discussed the high likelihood of a successful procedure. We discussed the risks of the procedure, including infection, bleeding, tissue injury, clip migration, and inadequate sampling. Informed written consent was given. The usual time-out protocol was performed immediately prior to the procedure.  Lesion quadrant: Upper outer quadrant  Using sterile technique and 1% Lidocaine as local anesthetic, under direct ultrasound visualization, a 12 gauge spring-loaded device was used to perform biopsy of the palpable lump and corresponding ill-defined area of hypoechogenicity in the upper outer quadrant, centered at 9:30 o'clock, using an inferior approach. At the conclusion of the procedure a ribbon shaped tissue marker clip was deployed into the biopsy cavity. Follow up 2 view mammogram was performed and dictated separately.  IMPRESSION: Ultrasound guided biopsy of the right breast. No apparent complications.  Electronically Signed: By:  Lajean Manes M.D. 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 This was found to be concordant by Dr. Lajean Manes. Pathology results were discussed with the patient by telephone. The patient reported doing well after the biopsy with tenderness at the site. Post biopsy instructions and care were reviewed and questions were answered. The patient was encouraged to call [facility] for any additional concerns. Surgical consultation has been arranged with Dr. [name] at [facility] on April 29, 2017.  Pathology results reported by Terie Purser, RN on 04/25/2017.   Electronically Signed By: Lajean Manes M.D. On: 04/25/2017 14:36    Problem List/Past Medical Rodman Key K. Soliyana Mcchristian, MD; 08/16/2017 12:44 PM) INVASIVE LOBULAR CARCINOMA OF BREAST IN FEMALE (C50.919)  Diagnostic Studies History (Cadel Stairs K. Trena Dunavan, MD; 08/16/2017 12:44 PM) Mammogram within last year Pap Smear 1-5 years ago  Allergies Rodman Key K. Cellie Dardis, MD; 08/16/2017 12:44 PM) No Known Drug Allergies [04/29/2017]:  Medication History Benjiman Core, CMA; 08/16/2017 11:17 AM) Letrozole (2.5MG Tablet, Oral) Active. Claritin (10MG Tablet, Oral) Active. MetFORMIN HCl (500MG Tablet, Oral) Active. Loperamide HCl (2MG Tablet, Oral) Active. Fluconazole (100MG Tablet, Oral) Active. Lidocaine-Prilocaine (2.5-2.5% Cream, External) Active. Prochlorperazine Maleate (10MG Tablet, Oral) Active. Irbesartan (150MG Tablet, Oral) Active. Simvastatin (40MG Tablet, Oral) Active. Multi-Day (Oral) Active. Aspir-Low (81MG Tablet DR, Oral) Active. Medications Reconciled  Social History Madison Stokes. Uzma Hellmer, MD; 08/16/2017 12:44 PM) Alcohol use Occasional alcohol use. Caffeine use Coffee. No drug use Tobacco use Never smoker.  Family History Madison Stokes. Mikaiah Stoffer, MD;  08/16/2017 12:44 PM) Cerebrovascular Accident Father. Diabetes Mellitus Brother, Father, Mother. Heart Disease Father. Hypertension Brother, Father, Mother. Kidney Disease Brother, Mother.  Pregnancy / Birth History Madison Stokes. Jhane Lorio, MD; 08/16/2017 12:44 PM) Age at menarche 51 years. Age of menopause 70-55 Gravida 2 Maternal age 74-30 Para 2  Other Problems Madison Stokes. Erma Raiche, MD; 08/16/2017 12:44 PM) High blood pressure Hypercholesterolemia Lump In Breast    Vitals (Armen Glenn CMA; 08/16/2017 11:16 AM) 08/16/2017 11:15 AM Weight: 124.38 lb Height: 62in Body Surface Area: 1.56 m Body Mass Index: 22.75 kg/m  Temp.: 98.4F  Pulse: 94 (Regular)  P.OX: 96% (Room air) BP: 146/90 (Sitting, Left Arm, Standard)      Physical Exam Rodman Key K. Seylah Wernert MD; 08/16/2017 12:45 PM)  The physical exam findings are as follows: Note:WDWN in NAD Eyes: Pupils equal, round; sclera anicteric HENT: Oral mucosa moist; good dentition Neck: No masses palpated, no thyromegaly Lungs: CTA bilaterally; normal respiratory effort Breasts: left breast - no nipple changes; no palpable masses; no axillary lymphadenopathy right breast - no nipple retraction; no axillary lymphadenopathy Palpable 5 cm area of firmness in the lateral right breast at 9:00 CV: Regular rate and rhythm; no murmurs; extremities well-perfused with no edema Abd: +bowel sounds, soft, non-tender, no palpable organomegaly; no palpable hernias Skin: Warm, dry; no sign of jaundice Psychiatric - alert and oriented x 4; calm mood and affect    Assessment & Plan Rodman Key K. 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 The surgical procedure has been discussed with the patient. Potential risks, benefits, alternative treatments, and expected outcomes have been explained. All  of the patient's questions at this time have been answered. The likelihood of reaching the patient's treatment goal is good. The patient understand the proposed surgical procedure and wishes to proceed.  Madison Stokes. Georgette Dover, MD, [facility]  General/ Trauma Surgery  08/16/2017 12:45 PM

## 2017-08-19 ENCOUNTER — Other Ambulatory Visit: Payer: Self-pay | Admitting: Surgery

## 2017-08-19 DIAGNOSIS — C50912 Malignant neoplasm of unspecified site of left female breast: Secondary | ICD-10-CM

## 2017-08-21 ENCOUNTER — Other Ambulatory Visit: Payer: Self-pay

## 2017-08-21 ENCOUNTER — Encounter (HOSPITAL_COMMUNITY): Payer: Self-pay | Admitting: *Deleted

## 2017-08-21 ENCOUNTER — Ambulatory Visit
Admission: RE | Admit: 2017-08-21 | Discharge: 2017-08-21 | Disposition: A | Discharge status: Home / Self Care | Payer: Medicare Other | Source: Ambulatory Visit | Attending: Surgery | Admitting: Surgery

## 2017-08-21 DIAGNOSIS — R928 Other abnormal and inconclusive findings on diagnostic imaging of breast: Secondary | ICD-10-CM | POA: Diagnosis not present

## 2017-08-21 DIAGNOSIS — C50912 Malignant neoplasm of unspecified site of left female breast: Secondary | ICD-10-CM

## 2017-08-21 NOTE — Progress Notes (Signed)
Spoke with pt for pre-op call. Pt denies cardiac history or diabetes.  

## 2017-08-22 ENCOUNTER — Encounter (HOSPITAL_COMMUNITY): Payer: Self-pay | Admitting: *Deleted

## 2017-08-22 ENCOUNTER — Encounter (HOSPITAL_COMMUNITY)
Admission: RE | Disposition: A | Discharge status: Home / Self Care | Payer: Self-pay | Source: Ambulatory Visit | Attending: Surgery

## 2017-08-22 ENCOUNTER — Ambulatory Visit (HOSPITAL_COMMUNITY): Payer: Medicare Other | Admitting: Anesthesiology

## 2017-08-22 ENCOUNTER — Encounter (HOSPITAL_COMMUNITY)
Admission: RE | Admit: 2017-08-22 | Discharge: 2017-08-22 | Disposition: A | Discharge status: Home / Self Care | Payer: Medicare Other | Source: Ambulatory Visit | Attending: Surgery | Admitting: Surgery

## 2017-08-22 ENCOUNTER — Ambulatory Visit (HOSPITAL_COMMUNITY)
Admission: RE | Admit: 2017-08-22 | Discharge: 2017-08-23 | Disposition: A | Discharge status: Home / Self Care | Payer: Medicare Other | Source: Ambulatory Visit | Attending: Surgery | Admitting: Surgery

## 2017-08-22 ENCOUNTER — Encounter (HOSPITAL_COMMUNITY): Payer: Medicare Other

## 2017-08-22 ENCOUNTER — Other Ambulatory Visit: Payer: Self-pay

## 2017-08-22 ENCOUNTER — Ambulatory Visit
Admission: RE | Admit: 2017-08-22 | Discharge: 2017-08-22 | Disposition: A | Discharge status: Home / Self Care | Payer: Medicare Other | Source: Ambulatory Visit | Attending: Surgery | Admitting: Surgery

## 2017-08-22 DIAGNOSIS — C773 Secondary and unspecified malignant neoplasm of axilla and upper limb lymph nodes: Secondary | ICD-10-CM | POA: Insufficient documentation

## 2017-08-22 DIAGNOSIS — N6012 Diffuse cystic mastopathy of left breast: Secondary | ICD-10-CM | POA: Diagnosis not present

## 2017-08-22 DIAGNOSIS — C50911 Malignant neoplasm of unspecified site of right female breast: Secondary | ICD-10-CM | POA: Diagnosis present

## 2017-08-22 DIAGNOSIS — E785 Hyperlipidemia, unspecified: Secondary | ICD-10-CM | POA: Diagnosis not present

## 2017-08-22 DIAGNOSIS — C50912 Malignant neoplasm of unspecified site of left female breast: Secondary | ICD-10-CM

## 2017-08-22 DIAGNOSIS — C50512 Malignant neoplasm of lower-outer quadrant of left female breast: Secondary | ICD-10-CM | POA: Diagnosis not present

## 2017-08-22 DIAGNOSIS — C50112 Malignant neoplasm of central portion of left female breast: Secondary | ICD-10-CM | POA: Diagnosis not present

## 2017-08-22 DIAGNOSIS — I10 Essential (primary) hypertension: Secondary | ICD-10-CM | POA: Diagnosis not present

## 2017-08-22 DIAGNOSIS — C50411 Malignant neoplasm of upper-outer quadrant of right female breast: Secondary | ICD-10-CM | POA: Insufficient documentation

## 2017-08-22 DIAGNOSIS — Z170 Estrogen receptor positive status [ER+]: Secondary | ICD-10-CM | POA: Insufficient documentation

## 2017-08-22 DIAGNOSIS — Z79899 Other long term (current) drug therapy: Secondary | ICD-10-CM | POA: Diagnosis not present

## 2017-08-22 DIAGNOSIS — D0512 Intraductal carcinoma in situ of left breast: Secondary | ICD-10-CM | POA: Diagnosis not present

## 2017-08-22 DIAGNOSIS — N6092 Unspecified benign mammary dysplasia of left breast: Secondary | ICD-10-CM | POA: Insufficient documentation

## 2017-08-22 DIAGNOSIS — D0501 Lobular carcinoma in situ of right breast: Secondary | ICD-10-CM | POA: Diagnosis not present

## 2017-08-22 DIAGNOSIS — G8918 Other acute postprocedural pain: Secondary | ICD-10-CM | POA: Diagnosis not present

## 2017-08-22 DIAGNOSIS — C50811 Malignant neoplasm of overlapping sites of right female breast: Secondary | ICD-10-CM | POA: Diagnosis not present

## 2017-08-22 HISTORY — PX: MASTECTOMY W/ SENTINEL NODE BIOPSY: SHX2001

## 2017-08-22 HISTORY — DX: Nausea with vomiting, unspecified: Z98.890

## 2017-08-22 HISTORY — DX: Anemia, unspecified: D64.9

## 2017-08-22 HISTORY — DX: Other specified postprocedural states: R11.2

## 2017-08-22 HISTORY — PX: BREAST LUMPECTOMY WITH RADIOACTIVE SEED AND SENTINEL LYMPH NODE BIOPSY: SHX6550

## 2017-08-22 LAB — CBC
HCT: 32.8 % — ABNORMAL LOW (ref 36.0–46.0)
Hemoglobin: 10.5 g/dL — ABNORMAL LOW (ref 12.0–15.0)
MCH: 31.9 pg (ref 26.0–34.0)
MCHC: 32 g/dL (ref 30.0–36.0)
MCV: 99.7 fL (ref 78.0–100.0)
Platelets: 284 10*3/uL (ref 150–400)
RBC: 3.29 MIL/uL — ABNORMAL LOW (ref 3.87–5.11)
RDW: 17.6 % — ABNORMAL HIGH (ref 11.5–15.5)
WBC: 2.8 10*3/uL — ABNORMAL LOW (ref 4.0–10.5)

## 2017-08-22 LAB — CREATININE, SERUM
Creatinine, Ser: 0.81 mg/dL (ref 0.44–1.00)
GFR calc Af Amer: >60 mL/min (ref >60)
GFR calc non Af Amer: >60 mL/min (ref >60)

## 2017-08-22 SURGERY — MASTECTOMY WITH SENTINEL LYMPH NODE BIOPSY
Anesthesia: General | Site: Breast | Laterality: Right

## 2017-08-22 MED ORDER — TECHNETIUM TC 99M SULFUR COLLOID FILTERED
1.0000 | Freq: Once | INTRAVENOUS | Status: AC | PRN
  Administered 2017-08-22: 1 via INTRADERMAL

## 2017-08-22 MED ORDER — LORATADINE 10 MG PO TABS: 10.0000 mg | ORAL_TABLET | Freq: Every day | ORAL | Status: DC | PRN

## 2017-08-22 MED ORDER — DIPHENHYDRAMINE HCL 12.5 MG/5ML PO ELIX
12.5000 mg | ORAL_SOLUTION | Freq: Four times a day (QID) | ORAL | Status: DC | PRN
Start: 2017-08-22 — End: 2017-08-23

## 2017-08-22 MED ORDER — SODIUM CHLORIDE 0.9 % IJ SOLN
INTRAMUSCULAR | Status: AC
  Filled 2017-08-22: qty 10

## 2017-08-22 MED ORDER — EPHEDRINE SULFATE-NACL 50-0.9 MG/10ML-% IV SOSY
PREFILLED_SYRINGE | INTRAVENOUS | Status: DC | PRN
  Administered 2017-08-22: 5 mg via INTRAVENOUS

## 2017-08-22 MED ORDER — ONDANSETRON 4 MG PO TBDP
4.0000 mg | ORAL_TABLET | Freq: Four times a day (QID) | ORAL | Status: DC | PRN
Start: 2017-08-22 — End: 2017-08-23

## 2017-08-22 MED ORDER — SUCCINYLCHOLINE CHLORIDE 200 MG/10ML IV SOSY
PREFILLED_SYRINGE | INTRAVENOUS | Status: AC
  Filled 2017-08-22: qty 10

## 2017-08-22 MED ORDER — TRAMADOL HCL 50 MG PO TABS: 50.0000 mg | ORAL_TABLET | Freq: Four times a day (QID) | ORAL | Status: DC | PRN

## 2017-08-22 MED ORDER — SUCCINYLCHOLINE CHLORIDE 200 MG/10ML IV SOSY
PREFILLED_SYRINGE | INTRAVENOUS | Status: DC | PRN
  Administered 2017-08-22: 60 mg via INTRAVENOUS

## 2017-08-22 MED ORDER — CHLORHEXIDINE GLUCONATE CLOTH 2 % EX PADS: 6.0000 | MEDICATED_PAD | Freq: Once | CUTANEOUS | Status: DC

## 2017-08-22 MED ORDER — FENTANYL CITRATE (PF) 250 MCG/5ML IJ SOLN
INTRAMUSCULAR | Status: AC
  Filled 2017-08-22: qty 5

## 2017-08-22 MED ORDER — CEFAZOLIN SODIUM-DEXTROSE 2-4 GM/100ML-% IV SOLN
2.0000 g | INTRAVENOUS | Status: AC
  Administered 2017-08-22: 2 g via INTRAVENOUS
  Filled 2017-08-22: qty 100

## 2017-08-22 MED ORDER — PHENYLEPHRINE HCL 10 MG/ML IJ SOLN
INTRAVENOUS | Status: DC | PRN
  Administered 2017-08-22: 20 ug/min via INTRAVENOUS

## 2017-08-22 MED ORDER — ONDANSETRON HCL 4 MG/2ML IJ SOLN
INTRAMUSCULAR | Status: DC | PRN
  Administered 2017-08-22: 4 mg via INTRAVENOUS

## 2017-08-22 MED ORDER — ACETAMINOPHEN 500 MG PO TABS
1000.0000 mg | ORAL_TABLET | ORAL | Status: AC
  Administered 2017-08-22: 1000 mg via ORAL
  Filled 2017-08-22: qty 2

## 2017-08-22 MED ORDER — SODIUM CHLORIDE 0.9 % IV SOLN
INTRAVENOUS | Status: DC
  Administered 2017-08-22: 15:00:00 via INTRAVENOUS

## 2017-08-22 MED ORDER — BUPIVACAINE-EPINEPHRINE (PF) 0.5% -1:200000 IJ SOLN
INTRAMUSCULAR | Status: AC
  Filled 2017-08-22: qty 30

## 2017-08-22 MED ORDER — IRBESARTAN 150 MG PO TABS
150.0000 mg | ORAL_TABLET | Freq: Every day | ORAL | Status: DC
  Administered 2017-08-22 – 2017-08-23 (×2): 150 mg via ORAL
  Filled 2017-08-22 (×2): qty 1

## 2017-08-22 MED ORDER — PROPOFOL 10 MG/ML IV BOLUS
INTRAVENOUS | Status: DC | PRN
  Administered 2017-08-22: 130 mg via INTRAVENOUS

## 2017-08-22 MED ORDER — FENTANYL CITRATE (PF) 100 MCG/2ML IJ SOLN
INTRAMUSCULAR | Status: DC | PRN
  Administered 2017-08-22: 50 ug via INTRAVENOUS

## 2017-08-22 MED ORDER — OXYCODONE HCL 5 MG PO TABS
5.0000 mg | ORAL_TABLET | ORAL | Status: DC | PRN
  Administered 2017-08-22 – 2017-08-23 (×5): 5 mg via ORAL
  Filled 2017-08-22 (×2): qty 1
  Filled 2017-08-22: qty 2
  Filled 2017-08-22 (×2): qty 1

## 2017-08-22 MED ORDER — MIDAZOLAM HCL 2 MG/2ML IJ SOLN
INTRAMUSCULAR | Status: AC
  Filled 2017-08-22: qty 2

## 2017-08-22 MED ORDER — FAMOTIDINE 20 MG PO TABS
ORAL_TABLET | ORAL | Status: AC
  Filled 2017-08-22: qty 1

## 2017-08-22 MED ORDER — EPHEDRINE 5 MG/ML INJ
INTRAVENOUS | Status: AC
  Filled 2017-08-22: qty 10

## 2017-08-22 MED ORDER — FAMOTIDINE 20 MG PO TABS
20.0000 mg | ORAL_TABLET | ORAL | Status: AC
  Administered 2017-08-22: 20 mg via ORAL

## 2017-08-22 MED ORDER — ROCURONIUM BROMIDE 100 MG/10ML IV SOLN
INTRAVENOUS | Status: DC | PRN
  Administered 2017-08-22: 10 mg via INTRAVENOUS
  Administered 2017-08-22: 50 mg via INTRAVENOUS

## 2017-08-22 MED ORDER — DEXAMETHASONE SODIUM PHOSPHATE 10 MG/ML IJ SOLN
INTRAMUSCULAR | Status: AC
  Filled 2017-08-22: qty 1

## 2017-08-22 MED ORDER — ROCURONIUM BROMIDE 50 MG/5ML IV SOLN
INTRAVENOUS | Status: AC
  Filled 2017-08-22: qty 1

## 2017-08-22 MED ORDER — LOPERAMIDE HCL 2 MG PO CAPS: 2.0000 mg | ORAL_CAPSULE | Freq: Four times a day (QID) | ORAL | Status: DC | PRN

## 2017-08-22 MED ORDER — SIMVASTATIN 40 MG PO TABS
40.0000 mg | ORAL_TABLET | Freq: Every day | ORAL | Status: DC
  Administered 2017-08-22: 40 mg via ORAL
  Filled 2017-08-22: qty 1

## 2017-08-22 MED ORDER — SUGAMMADEX SODIUM 200 MG/2ML IV SOLN
INTRAVENOUS | Status: AC
  Filled 2017-08-22: qty 2

## 2017-08-22 MED ORDER — GABAPENTIN 300 MG PO CAPS
300.0000 mg | ORAL_CAPSULE | Freq: Two times a day (BID) | ORAL | Status: DC
  Administered 2017-08-22 – 2017-08-23 (×3): 300 mg via ORAL
  Filled 2017-08-22 (×3): qty 1

## 2017-08-22 MED ORDER — MIDAZOLAM HCL 2 MG/2ML IJ SOLN
INTRAMUSCULAR | Status: AC
  Administered 2017-08-22: 2 mg
  Filled 2017-08-22: qty 2

## 2017-08-22 MED ORDER — KETAMINE HCL-SODIUM CHLORIDE 100-0.9 MG/10ML-% IV SOSY
PREFILLED_SYRINGE | INTRAVENOUS | Status: AC
  Filled 2017-08-22: qty 10

## 2017-08-22 MED ORDER — LETROZOLE 2.5 MG PO TABS
2.5000 mg | ORAL_TABLET | Freq: Every day | ORAL | Status: DC
  Administered 2017-08-23: 2.5 mg via ORAL
  Filled 2017-08-22: qty 1

## 2017-08-22 MED ORDER — SCOPOLAMINE 1 MG/3DAYS TD PT72
1.0000 | MEDICATED_PATCH | TRANSDERMAL | Status: DC
  Administered 2017-08-22: 1.5 mg via TRANSDERMAL

## 2017-08-22 MED ORDER — KETOROLAC TROMETHAMINE 15 MG/ML IJ SOLN
15.0000 mg | Freq: Four times a day (QID) | INTRAMUSCULAR | Status: DC
  Administered 2017-08-22 – 2017-08-23 (×3): 15 mg via INTRAVENOUS
  Filled 2017-08-22 (×3): qty 1

## 2017-08-22 MED ORDER — DIPHENHYDRAMINE HCL 50 MG/ML IJ SOLN: 12.5000 mg | Freq: Four times a day (QID) | INTRAMUSCULAR | Status: DC | PRN

## 2017-08-22 MED ORDER — ROPIVACAINE HCL 5 MG/ML IJ SOLN
INTRAMUSCULAR | Status: DC | PRN
  Administered 2017-08-22: 32 mL via PERINEURAL

## 2017-08-22 MED ORDER — FENTANYL CITRATE (PF) 100 MCG/2ML IJ SOLN: 25.0000 ug | INTRAMUSCULAR | Status: DC | PRN

## 2017-08-22 MED ORDER — GABAPENTIN 300 MG PO CAPS
300.0000 mg | ORAL_CAPSULE | ORAL | Status: AC
  Administered 2017-08-22: 300 mg via ORAL
  Filled 2017-08-22: qty 1

## 2017-08-22 MED ORDER — HYDRALAZINE HCL 20 MG/ML IJ SOLN: 10.0000 mg | INTRAMUSCULAR | Status: DC | PRN

## 2017-08-22 MED ORDER — PROPOFOL 10 MG/ML IV BOLUS
INTRAVENOUS | Status: AC
  Filled 2017-08-22: qty 20

## 2017-08-22 MED ORDER — FENTANYL CITRATE (PF) 100 MCG/2ML IJ SOLN
INTRAMUSCULAR | Status: AC
  Administered 2017-08-22: 50 ug via INTRAVENOUS
  Filled 2017-08-22: qty 2

## 2017-08-22 MED ORDER — PHENYLEPHRINE 40 MCG/ML (10ML) SYRINGE FOR IV PUSH (FOR BLOOD PRESSURE SUPPORT)
PREFILLED_SYRINGE | INTRAVENOUS | Status: DC | PRN
  Administered 2017-08-22: 80 ug via INTRAVENOUS

## 2017-08-22 MED ORDER — MIDAZOLAM HCL 5 MG/5ML IJ SOLN
INTRAMUSCULAR | Status: DC | PRN
  Administered 2017-08-22: 1 mg via INTRAVENOUS

## 2017-08-22 MED ORDER — 0.9 % SODIUM CHLORIDE (POUR BTL) OPTIME
TOPICAL | Status: DC | PRN
  Administered 2017-08-22: 1000 mL

## 2017-08-22 MED ORDER — BUPIVACAINE-EPINEPHRINE (PF) 0.5% -1:200000 IJ SOLN
INTRAMUSCULAR | Status: DC | PRN
  Administered 2017-08-22: 14 mL

## 2017-08-22 MED ORDER — ONDANSETRON HCL 4 MG/2ML IJ SOLN
INTRAMUSCULAR | Status: AC
  Filled 2017-08-22: qty 2

## 2017-08-22 MED ORDER — METHOCARBAMOL 500 MG PO TABS: 500.0000 mg | ORAL_TABLET | Freq: Four times a day (QID) | ORAL | Status: DC | PRN

## 2017-08-22 MED ORDER — SUGAMMADEX SODIUM 200 MG/2ML IV SOLN
INTRAVENOUS | Status: DC | PRN
  Administered 2017-08-22: 110.6 mg via INTRAVENOUS

## 2017-08-22 MED ORDER — LIDOCAINE 2% (20 MG/ML) 5 ML SYRINGE
INTRAMUSCULAR | Status: DC | PRN
  Administered 2017-08-22: 40 mg via INTRAVENOUS

## 2017-08-22 MED ORDER — METHYLENE BLUE 0.5 % INJ SOLN
INTRAVENOUS | Status: AC
  Filled 2017-08-22: qty 10

## 2017-08-22 MED ORDER — KETAMINE HCL 10 MG/ML IJ SOLN
INTRAMUSCULAR | Status: DC | PRN
  Administered 2017-08-22: 20 mg via INTRAVENOUS
  Administered 2017-08-22 (×2): 10 mg via INTRAVENOUS

## 2017-08-22 MED ORDER — DEXAMETHASONE SODIUM PHOSPHATE 10 MG/ML IJ SOLN
INTRAMUSCULAR | Status: DC | PRN
  Administered 2017-08-22: 5 mg via INTRAVENOUS

## 2017-08-22 MED ORDER — ENOXAPARIN SODIUM 40 MG/0.4ML ~~LOC~~ SOLN
40.0000 mg | SUBCUTANEOUS | Status: DC
  Administered 2017-08-23: 40 mg via SUBCUTANEOUS
  Filled 2017-08-22: qty 0.4

## 2017-08-22 MED ORDER — SCOPOLAMINE 1 MG/3DAYS TD PT72
MEDICATED_PATCH | TRANSDERMAL | Status: AC
Start: 2017-08-22 — End: 2017-08-22
  Filled 2017-08-22: qty 1

## 2017-08-22 MED ORDER — ONDANSETRON HCL 4 MG/2ML IJ SOLN: 4.0000 mg | Freq: Four times a day (QID) | INTRAMUSCULAR | Status: DC | PRN

## 2017-08-22 MED ORDER — CEFAZOLIN SODIUM-DEXTROSE 2-4 GM/100ML-% IV SOLN
2.0000 g | Freq: Three times a day (TID) | INTRAVENOUS | Status: AC
  Administered 2017-08-22: 2 g via INTRAVENOUS
  Filled 2017-08-22: qty 100

## 2017-08-22 MED ORDER — FENTANYL CITRATE (PF) 100 MCG/2ML IJ SOLN
50.0000 ug | Freq: Once | INTRAMUSCULAR | Status: AC
  Administered 2017-08-22: 50 ug via INTRAVENOUS

## 2017-08-22 MED ORDER — SODIUM CHLORIDE 0.9 % IJ SOLN
INTRAVENOUS | Status: DC | PRN
  Administered 2017-08-22: 11:00:00 via INTRAMUSCULAR

## 2017-08-22 MED ORDER — LIDOCAINE 2% (20 MG/ML) 5 ML SYRINGE
INTRAMUSCULAR | Status: AC
  Filled 2017-08-22: qty 5

## 2017-08-22 MED ORDER — ONDANSETRON HCL 4 MG/2ML IJ SOLN: 4.0000 mg | Freq: Once | INTRAMUSCULAR | Status: DC | PRN

## 2017-08-22 MED ORDER — LACTATED RINGERS IV SOLN
INTRAVENOUS | Status: DC
  Administered 2017-08-22: 08:00:00 via INTRAVENOUS

## 2017-08-22 MED ORDER — MORPHINE SULFATE (PF) 4 MG/ML IV SOLN: 2.0000 mg | INTRAVENOUS | Status: DC | PRN

## 2017-08-22 SURGICAL SUPPLY — 63 items
APPLIER CLIP 9.375 MED OPEN (MISCELLANEOUS) ×3
BENZOIN TINCTURE PRP APPL 2/3 (GAUZE/BANDAGES/DRESSINGS) ×3 IMPLANT
BINDER BREAST LRG (GAUZE/BANDAGES/DRESSINGS) ×3 IMPLANT
BINDER BREAST XLRG (GAUZE/BANDAGES/DRESSINGS) IMPLANT
BIOPATCH RED 1 DISK 7.0 (GAUZE/BANDAGES/DRESSINGS) ×3 IMPLANT
BLADE SURG 15 STRL LF DISP TIS (BLADE) ×4 IMPLANT
BLADE SURG 15 STRL SS (BLADE) ×2
CANISTER SUCT 3000ML PPV (MISCELLANEOUS) ×3 IMPLANT
CHLORAPREP W/TINT 26ML (MISCELLANEOUS) ×3 IMPLANT
CLIP APPLIE 9.375 MED OPEN (MISCELLANEOUS) ×2 IMPLANT
CONT SPEC 4OZ CLIKSEAL STRL BL (MISCELLANEOUS) ×3 IMPLANT
COVER PROBE W GEL 5X96 (DRAPES) ×3 IMPLANT
COVER SURGICAL LIGHT HANDLE (MISCELLANEOUS) ×3 IMPLANT
DEVICE DUBIN SPECIMEN MAMMOGRA (MISCELLANEOUS) ×3 IMPLANT
DRAIN CHANNEL 19F RND (DRAIN) ×3 IMPLANT
DRAPE CHEST BREAST 15X10 FENES (DRAPES) IMPLANT
DRAPE ORTHO SPLIT 87X125 STRL (DRAPES) ×6 IMPLANT
DRAPE UTILITY XL STRL (DRAPES) IMPLANT
DRSG PAD ABDOMINAL 8X10 ST (GAUZE/BANDAGES/DRESSINGS) ×6 IMPLANT
DRSG TEGADERM 4X4.75 (GAUZE/BANDAGES/DRESSINGS) ×3 IMPLANT
ELECT BLADE 4.0 EZ CLEAN MEGAD (MISCELLANEOUS) ×3
ELECT CAUTERY BLADE 6.4 (BLADE) ×3 IMPLANT
ELECT REM PT RETURN 9FT ADLT (ELECTROSURGICAL) ×3
ELECTRODE BLDE 4.0 EZ CLN MEGD (MISCELLANEOUS) ×2 IMPLANT
ELECTRODE REM PT RTRN 9FT ADLT (ELECTROSURGICAL) ×2 IMPLANT
EVACUATOR SILICONE 100CC (DRAIN) ×3 IMPLANT
GAUZE SPONGE 2X2 8PLY STRL LF (GAUZE/BANDAGES/DRESSINGS) ×2 IMPLANT
GAUZE SPONGE 4X4 12PLY STRL (GAUZE/BANDAGES/DRESSINGS) ×3 IMPLANT
GAUZE XEROFORM 1X8 LF (GAUZE/BANDAGES/DRESSINGS) ×3 IMPLANT
GLOVE BIO SURGEON STRL SZ7 (GLOVE) ×6 IMPLANT
GLOVE BIOGEL PI IND STRL 7.5 (GLOVE) ×2 IMPLANT
GLOVE BIOGEL PI INDICATOR 7.5 (GLOVE) ×1
GOWN STRL REUS W/ TWL LRG LVL3 (GOWN DISPOSABLE) ×4 IMPLANT
GOWN STRL REUS W/TWL LRG LVL3 (GOWN DISPOSABLE) ×2
KIT BASIN OR (CUSTOM PROCEDURE TRAY) ×3 IMPLANT
KIT MARKER MARGIN INK (KITS) ×3 IMPLANT
KIT ROOM TURNOVER OR (KITS) ×3 IMPLANT
LIGHT WAVEGUIDE WIDE FLAT (MISCELLANEOUS) IMPLANT
NEEDLE 18GX1X1/2 (RX/OR ONLY) (NEEDLE) ×6 IMPLANT
NEEDLE FILTER BLUNT 18X 1/2SAF (NEEDLE) ×2
NEEDLE FILTER BLUNT 18X1 1/2 (NEEDLE) ×4 IMPLANT
NEEDLE HYPO 25GX1X1/2 BEV (NEEDLE) ×9 IMPLANT
NS IRRIG 1000ML POUR BTL (IV SOLUTION) ×3 IMPLANT
PACK GENERAL/GYN (CUSTOM PROCEDURE TRAY) ×3 IMPLANT
PACK SURGICAL SETUP 50X90 (CUSTOM PROCEDURE TRAY) ×3 IMPLANT
PAD ARMBOARD 7.5X6 YLW CONV (MISCELLANEOUS) ×3 IMPLANT
PENCIL BUTTON HOLSTER BLD 10FT (ELECTRODE) IMPLANT
SPECIMEN JAR X LARGE (MISCELLANEOUS) ×3 IMPLANT
SPONGE GAUZE 2X2 STER 10/PKG (GAUZE/BANDAGES/DRESSINGS) ×1
SPONGE LAP 4X18 X RAY DECT (DISPOSABLE) ×3 IMPLANT
STAPLER VISISTAT 35W (STAPLE) ×3 IMPLANT
STRIP CLOSURE SKIN 1/2X4 (GAUZE/BANDAGES/DRESSINGS) ×3 IMPLANT
SUT ETHILON 2 0 FS 18 (SUTURE) ×3 IMPLANT
SUT MNCRL AB 4-0 PS2 18 (SUTURE) ×6 IMPLANT
SUT VIC AB 3-0 SH 18 (SUTURE) ×6 IMPLANT
SUT VIC AB 3-0 SH 27 (SUTURE) ×2
SUT VIC AB 3-0 SH 27X BRD (SUTURE) ×4 IMPLANT
SYR BULB 3OZ (MISCELLANEOUS) IMPLANT
SYR CONTROL 10ML LL (SYRINGE) ×6 IMPLANT
TOWEL OR 17X24 6PK STRL BLUE (TOWEL DISPOSABLE) ×3 IMPLANT
TOWEL OR 17X26 10 PK STRL BLUE (TOWEL DISPOSABLE) ×3 IMPLANT
TUBE CONNECTING 12X1/4 (SUCTIONS) IMPLANT
YANKAUER SUCT BULB TIP NO VENT (SUCTIONS) IMPLANT

## 2017-08-22 NOTE — Transfer of Care (Signed)
Immediate Anesthesia Transfer of Care Note  Patient: Madison Stokes  Procedure(s) Performed: RIGHT MASTECTOMY WITH SENTINEL LYMPH NODE BIOPSY ERAS PATHWAY (Right Breast) LEFT BREAST LUMPECTOMY WITH RADIOACTIVE SEED AND SENTINEL LYMPH NODE BIOPSY ERAS PATHWAY (Left Breast)  Patient Location: PACU  Anesthesia Type:GA combined with regional for post-op pain  Level of Consciousness: drowsy and patient cooperative  Airway & Oxygen Therapy: Patient Spontanous Breathing and Patient connected to face mask oxygen  Post-op Assessment: Report given to RN and Post -op Vital signs reviewed and stable  Post vital signs: Reviewed and stable  Last Vitals:  Vitals:   08/22/17 0958 08/22/17 1311  BP:  (!) 154/95  Pulse: 77 80  Resp: 13 11  Temp:  (!) 36.3 C  SpO2: 100% 100%    Last Pain:  Vitals:   08/22/17 0802  TempSrc: Oral  PainSc: 0-No pain      Patients Stated Pain Goal: 5 (25/00/37 0488)  Complications: No apparent anesthesia complications

## 2017-08-22 NOTE — Interval H&P Note (Signed)
History and Physical Interval Note:  08/22/2017 9:47 AM  Madison Stokes  has presented today for surgery, with the diagnosis of Right invasive lobular carcinoma/ left invasive ductal carcinoma  The various methods of treatment have been discussed with the patient and family. After consideration of risks, benefits and other options for treatment, the patient has consented to  Procedure(s) with comments: [place] (Right) - PECTORAL BLOCK LEFT BREAST LUMPECTOMY WITH RADIOACTIVE SEED AND SENTINEL LYMPH NODE BIOPSY ERAS PATHWAY (Left) - PECTORAL BLOCK as a surgical intervention .  The patient's history has been reviewed, patient examined, no change in status, stable for surgery.  I have reviewed the patient's chart and labs.  Questions were answered to the patient's satisfaction.     Maia Petties

## 2017-08-22 NOTE — Anesthesia Preprocedure Evaluation (Addendum)
Anesthesia Evaluation  Patient identified by MRN, date of birth, ID band Patient awake    Reviewed: Allergy & Precautions, NPO status , Patient's Chart, lab work & pertinent test results  History of Anesthesia Complications (+) PONV and history of anesthetic complications  Airway Mallampati: III  TM Distance: >3 FB Neck ROM: Full    Dental no notable dental hx.    Pulmonary neg pulmonary ROS,    Pulmonary exam normal breath sounds clear to auscultation       Cardiovascular hypertension, Pt. on medications Normal cardiovascular exam Rhythm:Regular Rate:Normal  ECG: ST, rate 100  ECHO: - LVEF 60-65%, normal wall thickness, normal wall motion and GLS strain, grade 1 DD, normal LV filling pressure, mild MR, normal LA size, normal IVC.      Neuro/Psych negative neurological ROS  negative psych ROS   GI/Hepatic negative GI ROS, Neg liver ROS,   Endo/Other    Renal/GU negative Renal ROS     Musculoskeletal negative musculoskeletal ROS (+)   Abdominal   Peds  Hematology  (+) anemia , HLD   Anesthesia Other Findings Right invasive lobular carcinoma Left invasive ductal carcinoma Right eye injection  Reproductive/Obstetrics                            Anesthesia Physical Anesthesia Plan  ASA: III  Anesthesia Plan: General   Post-op Pain Management:    Induction: Intravenous  PONV Risk Score and Plan: 4 or greater and Ondansetron, Dexamethasone, Midazolam, Scopolamine patch - Pre-op and Treatment may vary due to age or medical condition  Airway Management Planned: LMA  Additional Equipment:   Intra-op Plan:   Post-operative Plan: Extubation in OR  Informed Consent: I have reviewed the patients History and Physical, chart, labs and discussed the procedure including the risks, benefits and alternatives for the proposed anesthesia with the patient or authorized representative who has  indicated his/her understanding and acceptance.   Dental advisory given  Plan Discussed with: CRNA  Anesthesia Plan Comments:        Anesthesia Quick Evaluation

## 2017-08-22 NOTE — Anesthesia Procedure Notes (Signed)
Anesthesia Regional Block: Pectoralis block   Pre-Anesthetic Checklist: ,, timeout performed, Correct Patient, Correct Site, Correct Laterality, Correct Procedure,, site marked, risks and benefits discussed, Surgical consent,  Pre-op evaluation,  At surgeon's request and post-op pain management  Laterality: Right and Left  Prep: chloraprep       Needles:  Injection technique: Single-shot  Needle Type: Echogenic Stimulator Needle     Needle Length: 10cm  Needle Gauge: 21     Additional Needles:   Procedures:,,,, ultrasound used (permanent image in chart),,,,  Narrative:  Start time: 08/22/2017 9:40 AM End time: 08/22/2017 9:50 AM Injection made incrementally with aspirations every 5 mL.  Performed by: Personally   Additional Notes: Functioning IV was confirmed and monitors were applied.  A 149mm 21ga [place] echogenic stimulator needle was used. Sterile prep, hand hygiene and sterile gloves were used.  Negative aspiration and negative test dose prior to incremental administration of local anesthetic. The patient tolerated the procedure well.

## 2017-08-22 NOTE — Progress Notes (Signed)
1408 Received pt from PACU. Pt alert and oriented x4. Dressings clean dry and intact. JP drain charged to suction. Pt resting comfortably in bed. No complaints of pain. Will continue to monitor.

## 2017-08-22 NOTE — Anesthesia Procedure Notes (Signed)
Procedure Name: Intubation Date/Time: 08/22/2017 10:28 AM Performed by: Freddie Breech, CRNA Pre-anesthesia Checklist: Patient identified, Emergency Drugs available, Suction available and Patient being monitored Patient Re-evaluated:Patient Re-evaluated prior to induction Oxygen Delivery Method: Circle System Utilized Preoxygenation: Pre-oxygenation with 100% oxygen Induction Type: IV induction Ventilation: Mask ventilation without difficulty Laryngoscope Size: Mac and 3 Grade View: Grade I Tube type: Oral Tube size: 7.0 mm Number of attempts: 1 Airway Equipment and Method: Stylet and Oral airway Placement Confirmation: ETT inserted through vocal cords under direct vision,  positive ETCO2 and breath sounds checked- equal and bilateral Secured at: 21 cm Tube secured with: Tape Dental Injury: Teeth and Oropharynx as per pre-operative assessment

## 2017-08-22 NOTE — Op Note (Signed)
Preop diagnosis #1 invasive lobular carcinoma right breast (5.9 cm) 2.  Invasive ductal carcinoma left breast (4 mm) Postop diagnosis: Same Procedure performed: #1 blue dye injection 2.  Left radioactive seed localized lumpectomy 3.  Left axillary sentinel lymph node biopsy 4.  Right mastectomy with sentinel lymph node biopsy Surgeon:Apple Dearmas K Maisley Hainsworth Anesthesia: General endotracheal and bilateral pectoralis blocks Indications:This is a pleasant 72 yo female accompanied by husband and daughter-in-law (Nurse at [facility]) who was recently diagnosed with breast cancer. She had normal mammogram 05/02/16. In September, 2018, she palpated a large mass in the outer part of her right breast. She was seen by Dr. Dema Severin, who referred her for mammogram and ultrasound on 03/08/17. This showed no discrete masses, distortion, or asymmetry. Ultrasound also showed no discrete mass and ultrasound was negative. She underwent biopsy on 04/24/17, which returned a preliminary diagnosis of invasive mammary carcinoma, likely lobular. ER/ PR +, Her +.  I ordered a breast MRI which revealed suspicious findings on the left. This was biopsied and showed invasive ductal carcinoma ER/ PR positive, Her 2 negative. Because of her bilateral breast cancer she was referred to oncology. She has selected neoadjuvant chemotherapy. A port was placed on 05/14/17. She had a recent MRI which showed minimal change in the right invasive lobular carcinoma. The smaller left invasive ductal carcinoma has decreased from 4 mm to 1 mm in size. She presents now for surgery. She was hospitalized in January for bloody stool, but EGD and colonoscopy did not show any source of bleeding. She has not had any further bleeding episodes.   Description of procedure: In the preoperative area, the patient had bilateral pectoralis blocks placed by anesthesia.  She also had technetium sulfur colloid injected bilaterally by radiology.  Patient was brought to  the operating room and placed in supine position on the operating room table.  After an adequate level of general anesthesia was obtained I prepped both nipples and injected methylene blue dye solution.  Her entire chest was prepped with ChloraPrep and draped in sterile fashion.  A timeout was taken to ensure the proper patient and proper procedure.  We began on the patient's left side.  We switched the neoprobe to radioactive seed settings.  Identified an area of activity that was directly behind the areola.  We made a circumareolar incision and carried our dissection down into the breast tissue.  The neoprobe guided [place] towards the seed.  We excised a 2 cm area of breast tissue around the area of radioactivity.  We dissected until we were deep to the seed.  The specimen was removed and oriented with a paint kit.  Specimen mammogram confirmed that the radioactive seed and biopsy clip within the center of the specimen.  We inspect for hemostasis.  The wound was packed with a moistened sponge.  We then change the settings on the neoprobe.  We interrogated the axilla and identified an area of activity.  I made a transverse incision across the axilla.  We dissected down in the subcutaneous tissues with cautery.  I immediately encountered some blue lymphatics.  We follow this down into the axillary contents.  We identified 3 separate hot blue lymph nodes.  These were removed and sent as left axillary sentinel lymph nodes #1, #2, #3.  There is minimal background activity.  We irrigated both wounds thoroughly and inspected for hemostasis.  Both wounds were closed with deep layers of 3-0 Vicryl.  4-0 Monocryl was used to close both skin incisions.  We then turned our attention to the right side.  The patient has a firm palpable mass in the lateral part of her right breast.  She does not have any palpable lymph nodes.  I outlined an elliptical incision including her nipple areola complex.  We made her upper incision with  a scalpel.  Cautery was then used to dissect down into the subcutaneous tissue.  We raised our skin flaps superiorly.  Our medial border was the edge of the sternum.  Superiorly we dissected up to the chest wall above the breast.  Laterally we dissected to the axilla.  We then made our lower incision and raised the lower skin flap down the inframammary crease.  Laterally we dissected posterior until we encountered the anterior edge of the latissimus muscle.  We then dissected up towards the axilla.  We are then able to identify blue lymphatics heading into the axilla.  We used the neoprobe to guide our sentinel lymph node biopsy.  Very deep in the axilla we encountered 3 separate hot blue lymph nodes.  These were also taken individually and sent for pathologic examination as right axillary sentinel lymph nodes #1, #2, and #3.  We inspect for hemostasis.  We then dissected the breast tissue off the chest wall from medial to lateral.  This was oriented with a suture laterally.  We irrigated the chest cavity thoroughly and inspected for hemostasis.  A 19 [place] Blake drain was passed through a small stab incision laterally in the inferior flap.  This was placed across the inframammary fold.  This was secured with a 2-0 nylon suture.  We again inspected for hemostasis.  We closed the wound with multiple interrupted 3-0 Vicryl sutures.  Staples were used to close the skin.  Dry dressings were applied.  A breast binder was applied.  The patient was then extubated and brought to the recovery room in stable condition.  All sponge, instrument, and needle counts are correct.  Imogene Burn. Georgette Dover, MD, [facility]  General/ Trauma Surgery  08/22/2017 1:01 PM

## 2017-08-23 ENCOUNTER — Encounter (HOSPITAL_COMMUNITY): Payer: Self-pay | Admitting: Surgery

## 2017-08-23 DIAGNOSIS — C773 Secondary and unspecified malignant neoplasm of axilla and upper limb lymph nodes: Secondary | ICD-10-CM | POA: Diagnosis not present

## 2017-08-23 DIAGNOSIS — E785 Hyperlipidemia, unspecified: Secondary | ICD-10-CM | POA: Diagnosis not present

## 2017-08-23 DIAGNOSIS — C50411 Malignant neoplasm of upper-outer quadrant of right female breast: Secondary | ICD-10-CM | POA: Diagnosis not present

## 2017-08-23 DIAGNOSIS — N6092 Unspecified benign mammary dysplasia of left breast: Secondary | ICD-10-CM | POA: Diagnosis not present

## 2017-08-23 DIAGNOSIS — I10 Essential (primary) hypertension: Secondary | ICD-10-CM | POA: Diagnosis not present

## 2017-08-23 DIAGNOSIS — Z170 Estrogen receptor positive status [ER+]: Secondary | ICD-10-CM | POA: Diagnosis not present

## 2017-08-23 LAB — BASIC METABOLIC PANEL
Anion gap: 8 (ref 5–15)
BUN: 12 mg/dL (ref 6–20)
CO2: 24 mmol/L (ref 22–32)
Calcium: 8.3 mg/dL — ABNORMAL LOW (ref 8.9–10.3)
Chloride: 107 mmol/L (ref 101–111)
Creatinine, Ser: 0.87 mg/dL (ref 0.44–1.00)
GFR calc Af Amer: >60 mL/min (ref >60)
GFR calc non Af Amer: >60 mL/min (ref >60)
Glucose, Bld: 122 mg/dL — ABNORMAL HIGH (ref 65–99)
Potassium: 4.1 mmol/L (ref 3.5–5.1)
Sodium: 139 mmol/L (ref 135–145)

## 2017-08-23 LAB — CBC
HCT: 27.5 % — ABNORMAL LOW (ref 36.0–46.0)
Hemoglobin: 8.9 g/dL — ABNORMAL LOW (ref 12.0–15.0)
MCH: 32.2 pg (ref 26.0–34.0)
MCHC: 32.4 g/dL (ref 30.0–36.0)
MCV: 99.6 fL (ref 78.0–100.0)
Platelets: 273 10*3/uL (ref 150–400)
RBC: 2.76 MIL/uL — ABNORMAL LOW (ref 3.87–5.11)
RDW: 17.6 % — ABNORMAL HIGH (ref 11.5–15.5)
WBC: 5.6 10*3/uL (ref 4.0–10.5)

## 2017-08-23 MED ORDER — OXYCODONE HCL 5 MG PO TABS: 5.0000 mg | ORAL_TABLET | Freq: Four times a day (QID) | ORAL | 0 refills | Status: DC | PRN

## 2017-08-23 NOTE — Discharge Instructions (Signed)
[facility] [place] °[phone] ° °MASTECTOMY: POST OP INSTRUCTIONS ° °Always review your discharge instruction sheet given to you by the facility where your surgery was performed. °IF YOU HAVE DISABILITY OR FAMILY LEAVE FORMS, YOU MUST BRING THEM TO THE OFFICE FOR PROCESSING.   °DO NOT GIVE THEM TO YOUR DOCTOR. °A prescription for pain medication may be given to you upon discharge.  Take your pain medication as prescribed, if needed.  If narcotic pain medicine is not needed, then you may take acetaminophen (Tylenol) or ibuprofen (Advil) as needed. °1. Take your usually prescribed medications unless otherwise directed. °2. If you need a refill on your pain medication, please contact your pharmacy.  They will contact our office to request authorization.  Prescriptions will not be filled after 5pm or on week-ends. °3. You should follow a light diet the first few days after arrival home, such as soup and crackers, etc.  Resume your normal diet the day after surgery. °4. Most patients will experience some swelling and bruising on the chest and underarm.  Ice packs will help.  Swelling and bruising can take several days to resolve.  °5. It is common to experience some constipation if taking pain medication after surgery.  Increasing fluid intake and taking a stool softener (such as Colace) will usually help or prevent this problem from occurring.  A mild laxative (Milk of Magnesia or Miralax) should be taken according to package instructions if there are no bowel movements after 48 hours. °6. Unless discharge instructions indicate otherwise, leave your bandage dry and in place until your next appointment in 3-5 days.  You may take a limited sponge bath.  No tube baths or showers until the drains are removed.  You may have steri-strips (small skin tapes) in place directly over the incision.  These strips should be left on the skin for 7-10 days.  If your surgeon used skin glue on the incision, you may  shower in 24 hours.  The glue will flake off over the next 2-3 weeks.  Any sutures or staples will be removed at the office during your follow-up visit. °7. DRAINS:  If you have drains in place, it is important to keep a list of the amount of drainage produced each day in your drains.  Before leaving the hospital, you should be instructed on drain care.  Call our office if you have any questions about your drains. °8. ACTIVITIES:  You may resume regular (light) daily activities beginning the next day--such as daily self-care, walking, climbing stairs--gradually increasing activities as tolerated.  You may have sexual intercourse when it is comfortable.  Refrain from any heavy lifting or straining until approved by your doctor. °a. You may drive when you are no longer taking prescription pain medication, you can comfortably wear a seatbelt, and you can safely maneuver your car and apply brakes. °b. RETURN TO WORK:  __________________________________________________________ °9. You should see your doctor in the office for a follow-up appointment approximately 3-5 days after your surgery.  Your doctor’s nurse will typically make your follow-up appointment when she calls you with your pathology report.  Expect your pathology report 2-3 business days after your surgery.  You may call to check if you do not hear from us after three days.   °10. OTHER INSTRUCTIONS: ______________________________________________________________________________________________ ____________________________________________________________________________________________ °WHEN TO CALL YOUR DOCTOR: °1. Fever over 101.0 °2. Nausea and/or vomiting °3. Extreme swelling or bruising °4. Continued bleeding from incision. °5. Increased pain, redness, or drainage from the incision. °  The clinic staff is available to answer your questions during regular business hours.  Please don’t hesitate to call and ask to speak to one of the nurses for clinical  concerns.  If you have a medical emergency, go to the nearest emergency room or call 911.  A surgeon from [facility] is always on call at the hospital. °[address], [address], [place], [place]  [address] ? [address] [address], [place], [place]   [address] °[phone] ? [phone] ? FAX [phone] °Web site: [email or web address] °

## 2017-08-23 NOTE — Anesthesia Postprocedure Evaluation (Signed)
Anesthesia Post Note  Patient: Madison Stokes  Procedure(s) Performed: RIGHT MASTECTOMY WITH SENTINEL LYMPH NODE BIOPSY ERAS PATHWAY (Right Breast) LEFT BREAST LUMPECTOMY WITH RADIOACTIVE SEED AND SENTINEL LYMPH NODE BIOPSY ERAS PATHWAY (Left Breast)     Patient location during evaluation: PACU Anesthesia Type: General and Regional Level of consciousness: awake and alert Pain management: pain level controlled Vital Signs Assessment: post-procedure vital signs reviewed and stable Respiratory status: spontaneous breathing, nonlabored ventilation, respiratory function stable and patient connected to nasal cannula oxygen Cardiovascular status: blood pressure returned to baseline and stable Postop Assessment: no apparent nausea or vomiting Anesthetic complications: no    Last Vitals:  Vitals:   08/22/17 2210 08/23/17 0631  BP: (!) 126/57 109/74  Pulse: 81 85  Resp: 18 18  Temp: (!) 36.4 C 36.6 C  SpO2: 96% 96%    Last Pain:  Vitals:   08/23/17 1122  TempSrc:   PainSc: 4                  Airyana Sprunger P Terrell Ostrand

## 2017-08-23 NOTE — Progress Notes (Signed)
Pt is ambulating in the hall, bil breast incisions clean and dry, dressing changed. Pain is controlled with oral pain meds. Drain care done w/ pt, educated on how to empty and record drain output. Discharge instructions given, discharged to home accompanied by spouse.

## 2017-08-23 NOTE — Discharge Summary (Signed)
Physician Discharge Summary  Patient ID: Summers Buendia MRN: [id number] DOB/AGE: Jun 01, 1946 72 y.o.  Admit date: 08/22/2017 Discharge date: 08/23/2017  Admission Diagnoses:  Invasive lobular carcinoma - right breast    Invasive ductal carcinoma - left breast  Discharge Diagnoses: same Active Problems:   Bilateral breast cancer [facility])   Discharged Condition: good  Hospital Course: Left radioactive seed localized lumpectomy/ left axillary sentinel lymph node biopsy/ right mastectomy with sentinel lymph node biopsy on 08/22/17.  She did very well overnight with minimal discomfort.  She is tolerating diet.  Ready for discharge.  Consults: None  Significant Diagnostic Studies: labs:  Lab Results  Component Value Date   WBC 5.6 08/23/2017   HGB 8.9 (L) 08/23/2017   HCT 27.5 (L) 08/23/2017   MCV 99.6 08/23/2017   PLT 273 08/23/2017     Treatments: surgery: see above  Discharge Exam: Blood pressure 109/74, pulse 85, temperature 97.8 F (36.6 C), temperature source Oral, resp. rate 18, height 5\' 2"  (1.575 m), weight 55.3 kg (122 lb), SpO2 96 %. General appearance: alert, cooperative and no distress Chest wall: minimal right side tenderness; flap viable; no hematoma;  Left circumareolar/ left axillary incisions c/d/i; drainage - serosanguinous  Disposition: 01-Home or Self Care   Allergies as of 08/23/2017   No Known Allergies     Medication List    STOP taking these medications   aspirin EC 81 MG tablet     TAKE these medications   dexamethasone 4 MG tablet Commonly known as:  DECADRON Take 2 tablets (8 mg total) 2 (two) times daily by mouth. Start the day before Taxotere. Then again the day after chemo for 3 days.   irbesartan 150 MG tablet Commonly known as:  AVAPRO Take 150 mg by mouth daily.   letrozole 2.5 MG tablet Commonly known as:  FEMARA Take 1 tablet (2.5 mg total) by mouth daily.   lidocaine-prilocaine cream Commonly known as:  EMLA Apply to affected  area once What changed:    how much to take  how to take this  when to take this  reasons to take this  additional instructions   loperamide 2 MG capsule Commonly known as:  IMODIUM Take 2-4 mg by mouth 4 (four) times daily as needed for diarrhea or loose stools.   loratadine 10 MG tablet Commonly known as:  CLARITIN Take 10 mg by mouth daily as needed for allergies.   metFORMIN 500 MG tablet Commonly known as:  GLUCOPHAGE Take 1 tablet (500 mg total) by mouth 2 (two) times daily with a meal. What changed:    when to take this  reasons to take this   multivitamin tablet Take 1 tablet daily by mouth.   ondansetron 8 MG disintegrating tablet Commonly known as:  ZOFRAN ODT Take 1 tablet (8 mg total) by mouth every 8 (eight) hours as needed for nausea or vomiting. Start on Day 3 post chemo   oxyCODONE 5 MG immediate release tablet Commonly known as:  Oxy IR/ROXICODONE Take 1 tablet (5 mg total) by mouth every 6 (six) hours as needed for moderate pain.   pantoprazole 40 MG tablet Commonly known as:  PROTONIX Take 1 tablet (40 mg total) by mouth daily.   prochlorperazine 10 MG tablet Commonly known as:  COMPAZINE Take 1 tablet (10 mg total) every 6 (six) hours as needed by mouth (Nausea or vomiting).   simvastatin 40 MG tablet Commonly known as:  ZOCOR Take 40 mg at bedtime by mouth.  Follow-up Information    [name], MD. Schedule an appointment as soon as possible for a visit in 1 week(s).   Specialty:  General Surgery Contact information: [address] [place] [address] [phone]           Signed: Maia Petties 08/23/2017, 8:59 AM

## 2017-09-03 ENCOUNTER — Other Ambulatory Visit: Payer: Self-pay | Admitting: Hematology

## 2017-09-03 ENCOUNTER — Telehealth: Payer: Self-pay | Admitting: *Deleted

## 2017-09-03 DIAGNOSIS — C50112 Malignant neoplasm of central portion of left female breast: Secondary | ICD-10-CM

## 2017-09-03 DIAGNOSIS — T451X5D Adverse effect of antineoplastic and immunosuppressive drugs, subsequent encounter: Secondary | ICD-10-CM

## 2017-09-03 NOTE — Telephone Encounter (Signed)
Spoke with pt and informed pt of Dr. Feng's instructions below.  Pt voiced understanding.  

## 2017-09-03 NOTE — Progress Notes (Signed)
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 [facility]) Staging form: Breast, AJCC 8th Edition - Clinical stage from 05/03/2017: Stage IA (cT1a, cN0, cM0, G1, ER: Positive, PR: Positive, HER2: Negative) - Signed by [name], MD on 05/07/2017 - Pathologic stage from 08/22/2017: No Stage Recommended (ypT0, pN0, cM0, GX, ER: Unknown, PR: Unknown, HER2: Unknown) - Signed by [name], MD on 09/05/2017  Cancer of overlapping sites of right female breast [facility]) Staging form: Breast, AJCC 8th Edition - Clinical stage from 04/24/2017: Stage IB (cT3, cN0, cM0, G2, ER: Positive, PR: Positive, HER2: Positive) - Signed by [name], MD on 05/07/2017 - Pathologic stage from 08/22/2017: No Stage Recommended (ypT3, pN1a, cM0, G2, ER: Positive, PR: Positive, HER2: Negative) - Signed by [name], MD on 09/05/2017       Cancer of overlapping sites of right female breast ([place])   03/08/2017 Mammogram    IMPRESSION: 1. Patient has a is palpable mass in the lateral right breast, in the location dense fibroglandular breast tissue. Although no discrete mass is seen sonographically or mammographically, the Clinical change remains concerning. Biopsy is recommended.  RECOMMENDATION: 1. Ultrasound-guided core needle biopsy of palpable abnormality in the lateral right breast.      04/24/2017 Breast US    IMPRESSION: Ultrasound guided biopsy of the right breast. No apparent complications.      04/24/2017 Initial Biopsy    Breast, right, needle core  biopsy, UOQ, centered at 9:30 o'clock - INVASIVE MAMMARY CARCINOMA. - SEE COMMENT. ER 100% positive PR 100% positive Ki67 10% HER2 positive      05/02/2017 Breast MRI    IMPRESSION: 1. 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 2. 4 mm enhancing mass with associated architectural distortion in the central left breast. This is suspicious for additional focus of carcinoma.      05/07/2017 Initial Diagnosis    Cancer of overlapping sites of right female breast ([place])      05/15/2017 Echocardiogram    ECHO 05/15/17  Impressions: - LVEF 60-65%, normal wall thickness, normal wall motion and GLS   strain, grade 1 DD, normal LV filling pressure, mild MR, normal   LA size, normal IVC.         05/22/2017 Imaging    CT CAP W Contrast 05/22/17 IMPRESSION: 1. No specific CT findings of nodal or other metastatic disease in the chest, abdomen, and pelvis. 2. Bosniak category 35F cyst in the right mid kidney, with mild enhancement along a septation but without nodularity. Unless follow up abdominal scans related to the patient's breast cancer adequately characterize this lesion, renal protocol MRI or CT scan would be recommended in 6 months time. 3. Other imaging findings of potential clinical significance: The Aortic Atherosclerosis (ICD10-I70.0). Ectatic ascending thoracic aorta without aneurysm. Small type 1 hiatal hernia. Several small hypodense liver lesions are likely benign although technically too small to characterize. Nonobstructive 1.2 cm right renal calculus. Anterior uterine fundal myometrial mass favoring fibroid. Pelvic floor laxity with small  cystocele. Notable spondylosis at L3-4 and L5-S1.       05/22/2017 Imaging    Bone Scan Whole Body 05/22/17 IMPRESSION: 1. No findings of osseous metastatic disease. 2. Lower lumbar activity corresponds to areas of significant benign spondylosis on  the CT scan.      05/24/2017 -  Chemotherapy    TCHP every 3 weeks for 6 cycles starting 05/24/17 followed by a year of maintenance Herceptin and Perjeta. Given lack of response she stopped TCHP after 4 cycles.  She will proceed with Maintenance Herceptin and Perjeta on 08/16/17.         07/12/2017 - 07/14/2017 Hospital Admission    Admit date: 07/12/17 Admission diagnosis: GI Hemorrhaging  Additional comments: Colonoscopy and EGD done with no evidence of bleeding site. Given blood transfusion on 07/15/17      08/02/2017 Imaging    MRI Breast Bilateral 2/16//18 IMPRESSION: 1. The known lobular carcinoma of the right breast has not significantly changed since the prior MRI from 05/02/2017. The disease spans approximately 5.6 cm, previously 5.9 cm in the anterior to posterior dimension. 2. 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 3.  No new suspicious findings in either breast. 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      08/16/2017 -  Anti-estrogen oral therapy    Letrozole once daily starting 08/16/17       08/22/2017 Surgery     RIGHT MASTECTOMY WITH SENTINEL LYMPH NODE BIOPSY ERAS PATHWAY by Dr. Georgette Dover      08/22/2017 Pathology Results    Diagnosis 08/22/17 1. Breast, lumpectomy, Left - ATYPICAL DUCTAL HYPERPLASIA - FIBROCYSTIC CHANGES - PREVIOUS BIOPSY SITE CHANGES - SEE COMMENT 2. Lymph node, sentinel, biopsy, Left Axillary #1 - NO CARCINOMA IDENTIFIED IN ONE LYMPH NODE (0/1) - SEE COMMENT 3. Lymph node, sentinel, biopsy, Left Axillary #2 - NO CARCINOMA IDENTIFIED IN ONE LYMPH NODE (0/1) - SEE COMMENT 4. Lymph node, sentinel, biopsy, Left Axillary #3 - NO CARCINOMA IDENTIFIED IN ONE LYMPH NODE (0/1) - SEE COMMENT 5. Lymph node, sentinel, biopsy, Left - NO  CARCINOMA IDENTIFIED IN ONE LYMPH NODE (0/1) - SEE COMMENT 6. 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 Lymph node, sentinel, biopsy, Right Axillary #1 - NO CARCINOMA IDENTIFIED IN ONE LYMPH NODE (0/1) - SEE COMMENT 8. Lymph node, sentinel, biopsy, Right - NO CARCINOMA IDENTIFIED IN ONE LYMPH NODE (0/1) - SEE COMMENT 9. Lymph node, sentinel, biopsy, Right - NO CARCINOMA IDENTIFIED IN ONE LYMPH NODE (0/1) 1 of 5 FINAL for Shidler, Madison Stokes ([id number]) Diagnosis(continued) - SEE COMMENT 10. Lymph node, sentinel, biopsy, Right Axillary #2 - METASTATIC CARCINOMA PRESENT IN ONE LYMPH NODE (1/1) - SEE COMMENT 11. Lymph node, sentinel, biopsy, Right Axillary #3 - NO CARCINOMA IDENTIFIED IN ONE LYMPH NODE (0/1) - SEE COMMENT 12. Lymph node, sentinel, biopsy, Right - NO CARCINOMA IDENTIFIED IN ONE LYMPH NODE (0/1) - SEE COMMENT       Cancer of central portion of left breast ([place])   05/02/2017 Breast MRI    IMPRESSION: 1. 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 2. 4 mm  enhancing mass with associated architectural distortion in the central left breast. This is suspicious for additional focus of carcinoma.      05/03/2017 Mammogram    IMPRESSION: 1. Suspicious area of architectural distortion in the left breast which corresponds to a small enhancing mass on MRI.  RECOMMENDATION: Stereotactic core needle biopsy of the area of architectural distortion in the left breast.       05/03/2017 Breast US    Stereotactic core needle biopsy of the area of architectural distortion in the left breast.      05/03/2017 Initial Biopsy    Breast, left, needle core biopsy, central, slightly toward the lower, outer quadrant - INVASIVE DUCTAL  CARCINOMA, MSBR GRADE 1/2. - SEE MICROSCOPIC DESCRIPTION. ER 50% positive PR 90% positive Ki67: 1%      05/07/2017 Initial Diagnosis    Cancer of central portion of left breast ([place])      08/22/2017 Surgery    LEFT BREAST LUMPECTOMY WITH RADIOACTIVE SEED AND SENTINEL LYMPH NODE BIOPSY ERAS PATHWAY by Dr. Georgette Dover 08/22/17       08/22/2017 Pathology Results    Diagnosis 08/22/17 1. Breast, lumpectomy, Left - ATYPICAL DUCTAL HYPERPLASIA - FIBROCYSTIC CHANGES - PREVIOUS BIOPSY SITE CHANGES - SEE COMMENT 2. Lymph node, sentinel, biopsy, Left Axillary #1 - NO CARCINOMA IDENTIFIED IN ONE LYMPH NODE (0/1) - SEE COMMENT 3. Lymph node, sentinel, biopsy, Left Axillary #2 - NO CARCINOMA IDENTIFIED IN ONE LYMPH NODE (0/1) - SEE COMMENT 4. Lymph node, sentinel, biopsy, Left Axillary #3 - NO CARCINOMA IDENTIFIED IN ONE LYMPH NODE (0/1) - SEE COMMENT 5. Lymph node, sentinel, biopsy, Left - NO CARCINOMA IDENTIFIED IN ONE LYMPH NODE (0/1) - SEE COMMENT 6. 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 Lymph node, sentinel, biopsy, Right Axillary #1 - NO CARCINOMA IDENTIFIED IN ONE LYMPH NODE (0/1) - SEE COMMENT 8. Lymph node, sentinel, biopsy, Right - NO CARCINOMA IDENTIFIED IN ONE LYMPH NODE (0/1) - SEE COMMENT 9. Lymph node, sentinel, biopsy, Right - NO CARCINOMA IDENTIFIED IN ONE LYMPH NODE (0/1) 1 of 5 FINAL for Chopin, Madison Stokes ([id number]) Diagnosis(continued) - SEE COMMENT 10. Lymph node, sentinel, biopsy, Right Axillary #2 - METASTATIC CARCINOMA PRESENT IN ONE LYMPH NODE (1/1) - SEE COMMENT 11. Lymph node, sentinel, biopsy, Right Axillary #3 - NO CARCINOMA IDENTIFIED IN ONE LYMPH NODE (0/1) - SEE COMMENT 12. Lymph node, sentinel, biopsy, Right - NO CARCINOMA IDENTIFIED IN ONE LYMPH NODE (0/1) - SEE COMMENT       HISTORY OF PRESENTING ILLNESS:  Mareli Antunes 72 y.o. female is here because of newly diagnosed bilateral breast cancer. She was referred by Dr. Georgette Dover. She presents to clinic accompanied by her daughter in law and husband. She noticed a right breast mass in early September 2018, she underwent diagnostic right mammogram with ultrasound on 03/08/17; no discrete mass was seen sonographically or mammographically. She underwent right breast biopsy with flip placement on 04/24/17, found to be invasive mammary carcinoma, favoring lobular type. She was referred to Dr. Georgette Dover who then ordered bilateral breast MRI. Imaging identified non mass enhancement spanning 5.9 x 3.2 x 5.6 cm in the right breast as well as a small enhancing 4 mm mass in the central left breast with a small hypoechoic focus. She then underwent diagnostic mammogram with [place] and biopsy in the left breast. Pathology revealed invasive ductal carcinoma.  Her last mammogram was 04/2016, no abnormal mammogram or previous breast biopsy. She has not noticed associated breast changes, nipple discharge or inversion, or skin changes. No weight loss, decreased appetite, or fatigue.   She has no significant past medical history, she is on blood pressure and cholesterol medication. She is retired Network engineer, lives with her husband; has 2 adult sons. No tobacco or alcohol history. She reports having a cold lately she caught from her grandson but otherwise feels well. Occasionally has urgent BM after meals, denies abd fullness, pain, early satiety, nausea or vomiting.   GYN HISTORY  Menarchal: 12 LMP: age 73, menopause  Contraceptive:  HRT: None GP: 2 pregnancies, 2 births    CURRENT THERAPY:  1. Letrozole once daily starting 08/16/17  2. Herceptin and Perjeta every 3 weeks for maintenance therapy starting 08/16/17   INTERVAL HISTORY:  Melrose Kearse is here for a follow up for her bilateral breast cancer post b/l breast surgery.  Of note, since last  visit she underwent right mastectomy and left breast lumpectomy with b/l sentinel lymph node biopsy on 08/22/17.   She presents to the clinic today accompanied by her family. She notes her surgery went well overall. She notes she is tolerating letrozole well. She has hot flashes but they are tolerable. She will see Dr. Georgette Dover in a month.   On review of symptoms, pt has most of her ROM, and still healing from surgery. She notes tingling in her feet. She denies redness and swelling in her hands. She notes color change in her finger nails after last chemotherapy. Tolerable Hot flashes.      SOCIAL HISTORY: Social History   Socioeconomic History  . Marital status: Married    Spouse name: Not on file  . Number of children: Not on file  . Years of education: Not on file  . Highest education level: Not on file  Social Needs  . Financial resource strain: Not on file  . Food insecurity - worry: Not on file  . Food insecurity - inability: Not on file  . Transportation needs - medical: Not on file  . Transportation needs - non-medical: Not on file  Occupational History  . Not on file  Tobacco Use  . Smoking status: Never Smoker  . Smokeless tobacco: Never Used  Substance and Sexual Activity  . Alcohol use: Yes    Comment: 1 glass of wine per month  . Drug use: No  . Sexual activity: Not on file  Other Topics Concern  . Not on file  Social History Narrative  . Not on file    FAMILY HISTORY: Family History  Problem Relation Age of Onset  . Cancer Mother 84       uterine  . Cancer Brother        prostate  . Heart disease Father   . Heart attack Father   . Diabetes Father   . Hypertension Father     ALLERGIES:  has No Known Allergies.  MEDICATIONS:  Current Outpatient Medications  Medication Sig Dispense Refill  . irbesartan (AVAPRO) 150 MG tablet Take 150 mg by mouth daily.     Marland Kitchen letrozole (FEMARA) 2.5 MG tablet Take 1 tablet (2.5 mg total) by mouth daily. 30 tablet 3  .  lidocaine-prilocaine (EMLA) cream Apply to affected area once (Patient taking differently: Apply 1 application topically daily as needed (prior to port being accessed. (every 3 weeks)). Apply to affected area once) 30 g 3  . Multiple Vitamin (  MULTIVITAMIN) tablet Take 1 tablet daily by mouth.    . simvastatin (ZOCOR) 40 MG tablet Take 40 mg at bedtime by mouth.    . loperamide (IMODIUM) 2 MG capsule Take 2-4 mg by mouth 4 (four) times daily as needed for diarrhea or loose stools.     Marland Kitchen loratadine (CLARITIN) 10 MG tablet Take 10 mg by mouth daily as needed for allergies.    Marland Kitchen ondansetron (ZOFRAN ODT) 8 MG disintegrating tablet Take 1 tablet (8 mg total) by mouth every 8 (eight) hours as needed for nausea or vomiting. Start on Day 3 post chemo (Patient not taking: Reported on 08/19/2017) 20 tablet 0  . oxyCODONE (OXY IR/ROXICODONE) 5 MG immediate release tablet Take 1 tablet (5 mg total) by mouth every 6 (six) hours as needed for moderate pain. (Patient not taking: Reported on 09/05/2017) 20 tablet 0  . prochlorperazine (COMPAZINE) 10 MG tablet Take 1 tablet (10 mg total) every 6 (six) hours as needed by mouth (Nausea or vomiting). (Patient not taking: Reported on 08/19/2017) 30 tablet 1   No current facility-administered medications for this visit.    Facility-Administered Medications Ordered in Other Visits  Medication Dose Route Frequency Provider Last Rate Last Dose  . sodium chloride flush (NS) 0.9 % injection 10 mL  10 mL Intracatheter PRN [name], MD   10 mL at 09/05/17 1542    REVIEW OF SYSTEMS:  Constitutional: Denies fatigue, fevers, chills or abnormal night sweats  (+) hot flashes, tolerable Eyes: Denies blurriness of vision, double vision or watery eyes Ears, nose, mouth, throat, and face: Denies mucositis or sore throat  Respiratory: Denies dyspnea or wheezes  Cardiovascular: Denies palpitation, chest discomfort or lower extremity swelling Gastrointestinal:  Denies nausea, vomiting     Skin: negative Lymphatics: Denies new lymphadenopathy or easy bruising Neurological:Denies new weaknesses (+) tingling in her feet Behavioral/Psych: Mood is stable, no new changes  All other systems were reviewed with the patient and are negative.  PHYSICAL EXAMINATION: ECOG PERFORMANCE STATUS: 1  Vitals:   09/05/17 1122  BP: (!) 151/79  Pulse: 91  Resp: 17  Temp: 97.6 F (36.4 C)  SpO2: 100%   Filed Weights   09/05/17 1122  Weight: 123 lb 1.6 oz (55.8 kg)     GENERAL:alert, no distress and comfortable SKIN: skin color, texture, turgor are normal, no rashes or significant lesions EYES: normal, conjunctiva are pink and non-injected, sclera clear OROPHARYNX:no exudate, no erythema and lips, buccal mucosa, and tongue normal  NECK: supple, thyroid normal size, non-tender, without nodularity LYMPH:  no palpable cervical, supraclavicular, axillary, or inguinal lymphadenopathy  LUNGS: clear to auscultation bilaterally with normal breathing effort HEART: regular rate & rhythm and no murmurs and no lower extremity edema ABDOMEN:abdomen soft, non-tender and normal bowel sounds. No palpable hepatomegaly  Musculoskeletal:no cyanosis of digits and no clubbing  PSYCH: alert & oriented x 3 with fluent speech NEURO: no focal motor/sensory deficits BREASTS: S/p right mastectomy and left lumpectomy: (+) right breast surgically absent, surgical scars are healing well. (+) mild fluid collection under left breast incision. No palpable mass or adenopathy     CBC Latest Ref Rng & Units 09/05/2017 08/23/2017 08/22/2017  WBC 3.9 - 10.3 K/uL 3.8(L) 5.6 2.8(L)  Hemoglobin 11.6 - 15.9 g/dL 8.0(L) 8.9(L) 10.5(L)  Hematocrit 34.8 - 46.6 % 24.5(L) 27.5(L) 32.8(L)  Platelets 145 - 400 K/uL 225 273 284   CMP Latest Ref Rng & Units 09/05/2017 08/23/2017 08/22/2017  Glucose 70 - 140 mg/dL 134  122(H) -  BUN 7 - 26 mg/dL 15 12 -  Creatinine 0.60 - 1.10 mg/dL 0.79 0.87 0.81  Sodium 136 - 145 mmol/L 143 139 -   Potassium 3.5 - 5.1 mmol/L 4.2 4.1 -  Chloride 98 - 109 mmol/L 110(H) 107 -  CO2 22 - 29 mmol/L 26 24 -  Calcium 8.4 - 10.4 mg/dL 8.7 8.3(L) -  Total Protein 6.4 - 8.3 g/dL 5.9(L) - -  Total Bilirubin 0.2 - 1.2 mg/dL 0.2 - -  Alkaline Phos 40 - 150 U/L 97 - -  AST 5 - 34 U/L 30 - -  ALT 0 - 55 U/L 39 - -     CA 27.29  05/13/17: 59.5 05/31/17: 50.0 07/04/17: 44.2 08/02/17: 49.6 09/05/17: PENDING    Diagnosis 08/22/17 1. Breast, lumpectomy, Left - ATYPICAL DUCTAL HYPERPLASIA - FIBROCYSTIC CHANGES - PREVIOUS BIOPSY SITE CHANGES - SEE COMMENT 2. Lymph node, sentinel, biopsy, Left Axillary #1 - NO CARCINOMA IDENTIFIED IN ONE LYMPH NODE (0/1) - SEE COMMENT 3. Lymph node, sentinel, biopsy, Left Axillary #2 - NO CARCINOMA IDENTIFIED IN ONE LYMPH NODE (0/1) - SEE COMMENT 4. Lymph node, sentinel, biopsy, Left Axillary #3 - NO CARCINOMA IDENTIFIED IN ONE LYMPH NODE (0/1) - SEE COMMENT 5. Lymph node, sentinel, biopsy, Left - NO CARCINOMA IDENTIFIED IN ONE LYMPH NODE (0/1) - SEE COMMENT 6. 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 Lymph node, sentinel, biopsy, Right Axillary #1 - NO CARCINOMA IDENTIFIED IN ONE LYMPH NODE (0/1) - SEE COMMENT 8. Lymph node, sentinel, biopsy, Right - NO CARCINOMA IDENTIFIED IN ONE LYMPH NODE (0/1) - SEE COMMENT 9. Lymph node, sentinel, biopsy, Right - NO CARCINOMA IDENTIFIED IN ONE LYMPH NODE (0/1) 1 of 5 FINAL for Baumgart, Madison Stokes ([id number]) Diagnosis(continued) - SEE COMMENT 10. Lymph node, sentinel, biopsy, Right Axillary #2 - METASTATIC CARCINOMA PRESENT IN ONE LYMPH NODE (1/1) - SEE COMMENT 11. Lymph node, sentinel, biopsy, Right Axillary #3 - NO CARCINOMA IDENTIFIED IN ONE LYMPH NODE (0/1) - SEE COMMENT 12. Lymph node, sentinel, biopsy, Right - NO CARCINOMA  IDENTIFIED IN ONE LYMPH NODE (0/1) - SEE COMMENT Microscopic Comment 1. An oncology table was not completed on this specimen because here was no residual carcinoma identified. 2. -5 and 7-12. Cytokeratin AE1/3 was utilized to exclude micrometastasis. Only one lymph node has isolated tumor cells (see part 10); all other lymph nodes were negative. 6. BREAST, STATUS POST NEOADJUVANT TREATMENT Procedure: Mastectomy Laterality: Right Tumor Size: 6 x 3.5x 2.5 cm (see comment) Histologic Type: Invasive lobular carcinoma Grade: Nottingham Grade 2 Tubular Differentiation: 3 Nuclear Pleomorphism: 3 Mitotic Count: 1 Ductal Carcinoma in Situ (DCIS): Not identified Regional Lymph Nodes: Number of Lymph Nodes Examined:10 Number of Sentinel Lymph Nodes Examined: 10 Lymph Nodes with Macrometastases: 0 Lymph Nodes with Micrometastases: 0 Lymph Nodes with Isolated Tumor Cells: 1 Margins: Uninvolved by carcinoma Invasive carcinoma, distance from closest margin: 0.9 (anterior soft tissue margin) DCIS, distance from closest margin: N/A Breast Prognostic Profile (pre-neoadjuvant case #: [id number]) Estrogen Receptor: Positive (100%, strong) Progesterone Receptor: Positive (100%, strong) Her2: Positive (Ratio: 2.10) Ki-67: 10% Residual Cancer Burden (RCB): Primary Tumor Bed: 60 mm x 79m Overall Cancer Cellularity: 30% Percentage of Cancer that is in Situ: N/A Number of Positive Lymph Nodes: 1 Diameter of Largest Lymph Node metastasis: 1 mm Residual Cancer Burden : 3.186 Residual Cancer  Burden Class: RCB-II Pathologic Stage Classification (p TNM, AJCC 8th Edition): Primary Tumor: ypT3 Regional Lymph Nodes: ypN0(i+) COMMENT: There is residual tumor present in all of the submitted blocks from the upper and lower outer quadrant of the breast and therefore the measurement of the tumor is based on the gross measurement.  6. By immunohistochemistry, the tumor cells are negative for Her2  (0).    Diagnosis 05/03/17  Breast, left, needle core biopsy, central, slightly toward the lower, outer quadrant - INVASIVE DUCTAL CARCINOMA, MSBR GRADE 1/2. - SEE MICROSCOPIC DESCRIPTION. ER 50% positive PR 90% positive Ki67 1% Microscopic Comment Breast prognostic profile will be performed. Called to [facility] on 05/06/2017.   Diagnosis 04/24/17  Breast, right, needle core biopsy, UOQ, centered at 9:30 o'clock - INVASIVE MAMMARY CARCINOMA. - SEE COMMENT. Microscopic Comment The carcinoma appears grade II. An E-cadherin and a breast prognostic profile will be performed and the results reported separately. The results were called to [facility] on 04/25/2017. (JBK:ecj 04/25/2017) Results: IMMUNOHISTOCHEMICAL AND MORPHOMETRIC ANALYSIS PERFORMED MANUALLY Estrogen Receptor: 100%, POSITIVE, STRONG STAINING INTENSITY Progesterone Receptor: 100%, POSITIVE, STRONG STAINING INTENSITY Proliferation Marker Ki67: 10% HER2: **POSITIVE** RATIO OF HER2/CEP17 SIGNALS 2.10 AVERAGE HER2 COPY NUMBER PER CELL 3.30  PROCEDURES  Colonoscopy by Dr. Therisa Doyne 07/14/17 IMPRESSION:  - The examined portion of the ileum was normal. - The entire examined colon is normal. - The distal rectum and anal verge are normal on retroflexion view. - No specimens collected    EGD by Dr. Therisa Doyne 07/13/17 IMPRESSION: - Normal esophagus. - Z-line regular, 36 cm from the incisors. - 2 cm hiatal hernia. - Non-bleeding erosive gastropathy. - Erythematous mucosa in the prepyloric region of the stomach. - Duodenal erosions without bleeding. - No specimens collected.   ECHO 05/15/17  Impressions: - LVEF 60-65%, normal wall thickness, normal wall motion and GLS   strain, grade 1 DD, normal LV filling pressure, mild MR, normal   LA size, normal IVC.   RADIOGRAPHIC STUDIES: I have personally reviewed the radiological images as listed and agreed with the findings in the  report.  Whole Body Bone Scan 05/22/17 IMPRESSION: 1. No findings of osseous metastatic disease. 2. Lower lumbar activity corresponds to areas of significant benign spondylosis on the CT scan  CT CAP with Contrast IMPRESSION: 1. No specific CT findings of nodal or other metastatic disease in the chest, abdomen, and pelvis. 2. Bosniak category 71F cyst in the right mid kidney, with mild enhancement along a septation but without nodularity. Unless follow up abdominal scans related to the patient's breast cancer adequately characterize this lesion, renal protocol MRI or CT scan would be recommended in 6 months time. 3. Other imaging findings of potential clinical significance: The Aortic Atherosclerosis (ICD10-I70.0). Ectatic ascending thoracic aorta without aneurysm. Small type 1 hiatal hernia. Several small hypodense liver lesions are likely benign although technically too small to characterize. Nonobstructive 1.2 cm right renal calculus. Anterior uterine fundal myometrial mass favoring fibroid. Pelvic floor laxity with small cystocele. Notable spondylosis at L3-4 and L5-S1.  Diagnostic Mammogram Bilateral 05/03/17  IMPRESSION: 1. 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 2. 4 mm enhancing mass with associated architectural distortion in the central left breast. This is suspicious for additional focus of Carcinoma.  Diagnostic Mammogram, right 03/08/17 IMPRESSION: 1. Patient has a is palpable mass in the 9:30 position in the lateral right breast 2 cm  from the nipple, in the location dense fibroglandular breast tissue. Although no discrete mass is seen sonographically or mammographically, the Clinical change remains concerning. Biopsy is recommended.  Mr Breast Bilateral W Wo Contrast Inc Cad  Result Date: 08/12/2017 CLINICAL DATA:  72 year old female diagnosed with invasive lobular carcinoma of the  right breast after initially presenting with a palpable lump in the right breast. Prior MRI indicated a 5.9 cm span of malignant non mass enhancement in the right breast, as well as a suspicious area of distortion in the left breast. Following workup and biopsy, this area in the left breast was found to be a grade 1-2 invasive ductal carcinoma in the central slightly lower outer quadrant. LABS:  Creatinine of 1.00 and GFR of 56 on 08/02/2017. EXAM: BILATERAL BREAST MRI WITH AND WITHOUT CONTRAST TECHNIQUE: Multiplanar, multisequence MR images of both breasts were obtained prior to and following the intravenous administration of 10 ml of MultiHance. THREE-DIMENSIONAL MR IMAGE RENDERING ON INDEPENDENT WORKSTATION: Three-dimensional MR images were rendered by post-processing of the original MR data on an independent workstation. The three-dimensional MR images were interpreted, and findings are reported in the following complete MRI report for this study. Three dimensional images were evaluated at the independent DynaCad workstation COMPARISON:  Previous exam(s). FINDINGS: Breast composition: c. Heterogeneous fibroglandular tissue. Background parenchymal enhancement: Minimal. Right breast: The non mass enhancement involving the majority of the upper-outer and lower-outer quadrants of the right breast has not significantly changed. A measure the greatest anterior to posterior span of abnormal enhancement to be 5.6 cm, previously measured at 5.9 cm. Left breast: Susceptibility artifact from the biopsy marking clip in the slightly lower outer quadrant of the left breast demonstrates stable distortion, however there is no significant associated enhancement. The 4 mm mass previously seen at this site now is only 1 mm in size. No new left breast abnormalities are identified. Lymph nodes: No abnormal appearing lymph nodes. Ancillary findings:  None. IMPRESSION: 1. The known lobular carcinoma of the right breast has not  significantly changed since the prior MRI from 05/02/2017. The disease spans approximately 5.6 cm, previously 5.9 cm in the anterior to posterior dimension. 2. 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 3.  No new suspicious findings in either breast. 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 BI-RADS CATEGORY  6: Known biopsy-proven malignancy. Electronically Signed   By: Ammie Ferrier M.D.   On: 08/12/2017 15:16   Nm Sentinel Node Inj-no Rpt (breast)  Result Date: 08/22/2017 Sulfur colloid was injected by the nuclear medicine technologist for melanoma sentinel node.   Mm Breast Surgical Specimen  Result Date: 08/22/2017 CLINICAL DATA:  Biopsy-proven invasive ductal carcinoma involving the lower outer quadrant of the left breast. Radioactive seed localization was performed yesterday in anticipation of today's lumpectomy. EXAM: SPECIMEN RADIOGRAPH OF THE LEFT BREAST COMPARISON:  Previous exam(s). FINDINGS: Status post excision of the left breast. The radioactive seed and the coil shaped biopsy marker clip are present, are completely intact, and are marked for pathology. This was discussed with the operating room nurse at the time of interpretation on 08/22/2017 at 11:14 a.m. IMPRESSION: Specimen radiograph of the left breast. Electronically Signed   By: Evangeline Dakin M.D.   On: 08/22/2017 11:17   Mm Lt Radioactive Seed Loc Mammo Guide  Result Date:  08/21/2017 CLINICAL DATA:  72 year old female presenting for radioactive seed localization of the left breast prior to lumpectomy. EXAM: MAMMOGRAPHIC GUIDED RADIOACTIVE SEED LOCALIZATION OF THE LEFT BREAST COMPARISON:  Previous exam(s). FINDINGS: Patient presents for radioactive seed localization prior to . I met with the patient and we discussed the  procedure of seed localization including benefits and alternatives. We discussed the high likelihood of a successful procedure. We discussed the risks of the procedure including infection, bleeding, tissue injury and further surgery. We discussed the low dose of radioactivity involved in the procedure. Informed, written consent was given. The usual time-out protocol was performed immediately prior to the procedure. Using mammographic guidance, sterile technique, 1% lidocaine and an I-125 radioactive seed, the subtle distorted mass in the left breast superior to the coil shaped biopsy marking clip was localized using a superior approach. The follow-up mammogram images confirm the seed in the expected location which is approximately 8 mm superior to the coil shaped biopsy marking clip and were marked for Dr. Georgette Dover. Follow-up survey of the patient confirms presence of the radioactive seed. Order number of I-125 seed:  [id number]. Total activity:  4.492 millicuries reference Date: 08/20/2017 The patient tolerated the procedure well and was released from the [facility]. She was given instructions regarding seed removal. IMPRESSION: Radioactive seed localization left breast. Note that the coil shaped biopsy marking clip was inferiorly displaced at the time of biopsy, therefore the seed was placed at the expected location of the mass, and is approximately 8 mm superior to the biopsy marking clip. No apparent complications. Electronically Signed   By: Ammie Ferrier M.D.   On: 08/21/2017 14:54    ASSESSMENT & PLAN:  72 y.o. caucasian postmenopausal woman   1.  Cancer of overlapping sites of right breast, invasive lobular carcinoma, Stage IB cT3, cN0, cM0, G2; ER positive, PR positive, HER2 positive, ypT3N1a 2.  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 In her right breast, she has large area of disease that is HER2 positive. -Due to the high risk of recurrence from right breast cancer, she would benefit from neoadjuvant or adjuvant chemotherapy. I recommended neoadjuvant chemotherapy to shrink her right breast cancer, to see if lumpectomy would be more feasible after neoadjuvant chemotherapy TCHP every 3 weeks for 6 cycles, followed by maintenance Herceptin with or without Perjeta to complete 1 year therapy. The goal of therapy is curative. -We also reviewed that lobular carcinoma is less sensitive to chemotherapy, will monitor her disease closely during the chemo, and likely get a interim scan to confirm her response to chemo. -Baseline CA 27.29 was 59.5, elevated   -I discussed her CT CAP and Bone scan from 05/22/17 shows no evidence of metastasis. There was incidental findings of 47F cyst in her right kidney and a non-obstructing kidney stone, will monitor.  -Baseline ECHO from 04/2017 is normal.  -She started [facility] on 05/24/17 with Onpro. -Due to her lack of response to chemotherapy, chemo was held after 4 cycles.  -She underwent right mastectomy and left breast lumpectomy with b/l sentinel lymph node biopsy on 08/22/17.  -Pathology results were reviewed with pt which showed a 6cm invasive ductal carcinoma in right breast, with 1 out of 6 positive lymph nodes.  Margins were negative.  Left breast invasive ductal carcinoma had complete response to chemotherapy tumor.  We  discussed right side lymph node dissection, giving her 1 out of 6 positive lymph nodes, I do not feel strongly she needs lymph node dissection.  -I request HER2 to be retested on her surgical right sided tumor, the IHC test was negative (0). I have requested to do HER2 IHC on her initial biopsy of right side tumor, to see if she truly has HER-2 positive disease. -I do not recommend adjuvant chemotherapy as her tumor is not sensitive to chemo, the benefits do not outweigh the  side effects.  -I recommend bilateral adjuvant radiation given her positive lymph node and risk of local recurrence. I will make rad-onc referral today.  -Pt has not decided on reconstruction at this time as breast cancer treatment is more urgent. I suggest a prosthetic bra in the meantime.  -She will proceed with Herceptin/Perjeta today. If her repeated HER2 is negative, I will stop Herceptin and pejeta. -She is tolerating letrozole well overall with manageable hot flashes. Continue letrozole daily for 10 years.  -F/u in 3 weeks    2. Genetics  -I reached out to genetics counselor to see if patient qualifies for genetics referral, she has synchronous bilateral ductal and lobular carcinoma, her brother has prostate cancer and her mother had uterine cancer in her 94's. I will follow up.  -I will refer her to genetics   3. 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 Will repeat scan in 2-3 months    4. Borderline DM,  secondary to steroids, yeast infection symptoms -will copy note to Dr. Kenton Kingfisher to provide her with  more information of diabetic education. -Dietician consult was previously set up to help pt  -Yeast infection was treated with one dose of fluconazole given 05/23/17.  -she developed hyperglycemia with BG 224 with 1st cycle. Reduced her Decadron to 4 mg BID for 2 days prior to treatment then once daily for 2 days after treatment. -HB1c is 6.4 as of 05/31/17   -I previously reviewed what signs of hyperglycemia to look for and encouraged her to monitor her BG at home.  -I previously prescribed Metformin on 07/04/17 for her to take until she completes chemotherapy. I discussed the side effects of Metformin, including diarrhea and I suggested she speak to her PCP about this and they will monitor. -Currently has better controlled BG -She has stopped decadron with discontinuation of chemo.   5.  Anemia, secondary to chemotherapy, and history of GI bleeding in 06/2017 - Initially she was asymptomatic with no need for blood transfusion -She experienced significant GI bleeding with HG of 5.7 and HCT of 17.4 and was hospitalized on 07/12/17. GI workup showed no site of bleeding. She received blood transfusion on 07/15/17.  -Hg recovered and showed good response to blood transfusion.  -continue PPI  -Hg at 8.0 today (09/05/17), no need for blood transfusion. Will check her iron study    6. Bone Health  -With start of Letrozole I discussed the side effect of weak bone from letrozole   -Will request bone density scan sometime after her surgery.    PLAN:  -Proceed with Herceptin/Perjeta today  -will repeat HER2 test by IHC on her initial right breast tumor biopsy  -Continue letrozole daily  -Lab, flush, f/u and Herceptin and Perjeta in 3 weeks (may cancel treatment if repeated HER2 negative)  -Rad/onc referral for b/l adjuvant radiation      Orders Placed This  Encounter  Procedures  . Ambulatory referral to Radiation Oncology    Referral Priority:   Routine    Referral Type:   Consultation    Referral Reason:   Specialty Services Required    Requested Specialty:   Radiation Oncology    Number of Visits Requested:   1    All questions were answered. The patient knows to call the clinic with any problems, questions or concerns.   [name], MD 09/05/2017   This document serves as a record of services personally performed by [name], MD. It was created on her behalf by Joslyn Devon, a trained medical scribe. The creation of this record is based on the scribe's personal observations and the provider's statements to them.    I have reviewed the above documentation for accuracy and completeness, and I agree with the above.

## 2017-09-03 NOTE — Progress Notes (Unsigned)
cardiolo

## 2017-09-03 NOTE — Telephone Encounter (Signed)
-----   Message from [name], MD sent at 09/03/2017  2:04 PM EDT ----- Regarding: RE: ECHO due I have ordered echo to be done ASAP.  Heather, could you get this pt in to see Dr. Haroldine Laws or Aundra Dubin? She is a breast cancer pt pn Herceptin.  Rylon Poitra, please let pt know she needs a repeated echo and see cardiologist for monitoring side effects from Herceptin.   Thanks  Krista Blue  ----- Message ----- From: Margaret Pyle, [facility] Sent: 09/03/2017   1:21 PM To: [name], MD Subject: ECHO due                                       Dr. Burr Medico, It appears you are seeing the above pt on Thur.  She is currently on Herc/Perj q 3 weeks.  If you plan to con't the herceptin she is due for her next ECHO.  Last was in 11/18. Thanks, [employer]

## 2017-09-05 ENCOUNTER — Encounter: Payer: Self-pay | Admitting: Radiation Oncology

## 2017-09-05 ENCOUNTER — Inpatient Hospital Stay: Payer: Medicare Other

## 2017-09-05 ENCOUNTER — Inpatient Hospital Stay (HOSPITAL_BASED_OUTPATIENT_CLINIC_OR_DEPARTMENT_OTHER): Payer: Medicare Other | Admitting: Hematology

## 2017-09-05 ENCOUNTER — Inpatient Hospital Stay: Payer: Medicare Other | Attending: Hematology

## 2017-09-05 ENCOUNTER — Encounter: Payer: Self-pay | Admitting: Hematology

## 2017-09-05 VITALS — BP 151/79 | HR 91 | Temp 97.6°F | Resp 17 | Ht 62.0 in | Wt 123.1 lb

## 2017-09-05 DIAGNOSIS — E119 Type 2 diabetes mellitus without complications: Secondary | ICD-10-CM

## 2017-09-05 DIAGNOSIS — Z5112 Encounter for antineoplastic immunotherapy: Secondary | ICD-10-CM | POA: Diagnosis not present

## 2017-09-05 DIAGNOSIS — Z170 Estrogen receptor positive status [ER+]: Secondary | ICD-10-CM

## 2017-09-05 DIAGNOSIS — C50811 Malignant neoplasm of overlapping sites of right female breast: Secondary | ICD-10-CM | POA: Diagnosis not present

## 2017-09-05 DIAGNOSIS — D6481 Anemia due to antineoplastic chemotherapy: Secondary | ICD-10-CM

## 2017-09-05 DIAGNOSIS — N951 Menopausal and female climacteric states: Secondary | ICD-10-CM | POA: Diagnosis not present

## 2017-09-05 DIAGNOSIS — C50112 Malignant neoplasm of central portion of left female breast: Secondary | ICD-10-CM | POA: Insufficient documentation

## 2017-09-05 DIAGNOSIS — D500 Iron deficiency anemia secondary to blood loss (chronic): Secondary | ICD-10-CM

## 2017-09-05 DIAGNOSIS — Z7984 Long term (current) use of oral hypoglycemic drugs: Secondary | ICD-10-CM

## 2017-09-05 DIAGNOSIS — R739 Hyperglycemia, unspecified: Secondary | ICD-10-CM | POA: Diagnosis not present

## 2017-09-05 DIAGNOSIS — I10 Essential (primary) hypertension: Secondary | ICD-10-CM

## 2017-09-05 LAB — COMPREHENSIVE METABOLIC PANEL
ALT: 39 U/L (ref 0–55)
AST: 30 U/L (ref 5–34)
Albumin: 3.4 g/dL — ABNORMAL LOW (ref 3.5–5.0)
Alkaline Phosphatase: 97 U/L (ref 40–150)
Anion gap: 7 (ref 3–11)
BUN: 15 mg/dL (ref 7–26)
CO2: 26 mmol/L (ref 22–29)
Calcium: 8.7 mg/dL (ref 8.4–10.4)
Chloride: 110 mmol/L — ABNORMAL HIGH (ref 98–109)
Creatinine, Ser: 0.79 mg/dL (ref 0.60–1.10)
GFR calc Af Amer: >60 mL/min (ref >60)
GFR calc non Af Amer: >60 mL/min (ref >60)
Glucose, Bld: 134 mg/dL (ref 70–140)
Potassium: 4.2 mmol/L (ref 3.5–5.1)
Sodium: 143 mmol/L (ref 136–145)
Total Bilirubin: 0.2 mg/dL (ref 0.2–1.2)
Total Protein: 5.9 g/dL — ABNORMAL LOW (ref 6.4–8.3)

## 2017-09-05 LAB — CBC WITH DIFFERENTIAL/PLATELET
Basophils Absolute: 0.1 10*3/uL (ref 0.0–0.1)
Basophils Relative: 1 %
Eosinophils Absolute: 0.2 10*3/uL (ref 0.0–0.5)
Eosinophils Relative: 5 %
HCT: 24.5 % — ABNORMAL LOW (ref 34.8–46.6)
Hemoglobin: 8 g/dL — ABNORMAL LOW (ref 11.6–15.9)
Lymphocytes Relative: 21 %
Lymphs Abs: 0.8 10*3/uL — ABNORMAL LOW (ref 0.9–3.3)
MCH: 32.3 pg (ref 25.1–34.0)
MCHC: 32.7 g/dL (ref 31.5–36.0)
MCV: 98.8 fL (ref 79.5–101.0)
Monocytes Absolute: 0.4 10*3/uL (ref 0.1–0.9)
Monocytes Relative: 9 %
Neutro Abs: 2.4 10*3/uL (ref 1.5–6.5)
Neutrophils Relative %: 64 %
Platelets: 225 10*3/uL (ref 145–400)
RBC: 2.48 MIL/uL — ABNORMAL LOW (ref 3.70–5.45)
RDW: 17.7 % — ABNORMAL HIGH (ref 11.2–14.5)
WBC: 3.8 10*3/uL — ABNORMAL LOW (ref 3.9–10.3)

## 2017-09-05 MED ORDER — SODIUM CHLORIDE 0.9% FLUSH
10.0000 mL | Freq: Once | INTRAVENOUS | Status: AC
  Administered 2017-09-05: 10 mL
  Filled 2017-09-05: qty 10

## 2017-09-05 MED ORDER — TRASTUZUMAB CHEMO 150 MG IV SOLR
6.0000 mg/kg | Freq: Once | INTRAVENOUS | Status: AC
  Administered 2017-09-05: 336 mg via INTRAVENOUS
  Filled 2017-09-05: qty 16

## 2017-09-05 MED ORDER — SODIUM CHLORIDE 0.9 % IV SOLN
Freq: Once | INTRAVENOUS | Status: AC
  Administered 2017-09-05: 13:00:00 via INTRAVENOUS

## 2017-09-05 MED ORDER — ACETAMINOPHEN 325 MG PO TABS
650.0000 mg | ORAL_TABLET | Freq: Once | ORAL | Status: AC
  Administered 2017-09-05: 650 mg via ORAL

## 2017-09-05 MED ORDER — SODIUM CHLORIDE 0.9% FLUSH
10.0000 mL | INTRAVENOUS | Status: DC | PRN
  Administered 2017-09-05: 10 mL
  Filled 2017-09-05: qty 10

## 2017-09-05 MED ORDER — ACETAMINOPHEN 325 MG PO TABS
ORAL_TABLET | ORAL | Status: AC
  Filled 2017-09-05: qty 2

## 2017-09-05 MED ORDER — HEPARIN SOD (PORK) LOCK FLUSH 100 UNIT/ML IV SOLN
500.0000 [IU] | Freq: Once | INTRAVENOUS | Status: AC | PRN
  Administered 2017-09-05: 500 [IU]
  Filled 2017-09-05: qty 5

## 2017-09-05 MED ORDER — DIPHENHYDRAMINE HCL 25 MG PO CAPS
50.0000 mg | ORAL_CAPSULE | Freq: Once | ORAL | Status: AC
  Administered 2017-09-05: 50 mg via ORAL

## 2017-09-05 MED ORDER — DIPHENHYDRAMINE HCL 25 MG PO CAPS
ORAL_CAPSULE | ORAL | Status: AC
  Filled 2017-09-05: qty 2

## 2017-09-05 MED ORDER — SODIUM CHLORIDE 0.9 % IV SOLN
420.0000 mg | Freq: Once | INTRAVENOUS | Status: AC
  Administered 2017-09-05: 420 mg via INTRAVENOUS
  Filled 2017-09-05: qty 14

## 2017-09-05 NOTE — Patient Instructions (Signed)
[facility] Discharge Instructions for Patients Receiving Chemotherapy  Today you received the following chemotherapy agents:  Herceptin and Perjeta.  To help prevent nausea and vomiting after your treatment, we encourage you to take your nausea medication as directed.   If you develop nausea and vomiting that is not controlled by your nausea medication, call the clinic.   BELOW ARE SYMPTOMS THAT SHOULD BE REPORTED IMMEDIATELY:  *FEVER GREATER THAN 100.5 F  *CHILLS WITH OR WITHOUT FEVER  NAUSEA AND VOMITING THAT IS NOT CONTROLLED WITH YOUR NAUSEA MEDICATION  *UNUSUAL SHORTNESS OF BREATH  *UNUSUAL BRUISING OR BLEEDING  TENDERNESS IN MOUTH AND THROAT WITH OR WITHOUT PRESENCE OF ULCERS  *URINARY PROBLEMS  *BOWEL PROBLEMS  UNUSUAL RASH Items with * indicate a potential emergency and should be followed up as soon as possible.  Feel free to call the clinic should you have any questions or concerns. The clinic phone number is [phone].  Please show the CHEMO ALERT CARD at check-in to the Emergency Department and triage nurse.  Trastuzumab injection for infusion What is this medicine? TRASTUZUMAB (tras TOO zoo mab) is a monoclonal antibody. It is used to treat breast cancer and stomach cancer. This medicine may be used for other purposes; ask your health care provider or pharmacist if you have questions. COMMON BRAND NAME(S): Herceptin What should I tell my health care provider before I take this medicine? They need to know if you have any of these conditions: -heart disease -heart failure -lung or breathing disease, like asthma -an unusual or allergic reaction to trastuzumab, benzyl alcohol, or other medications, foods, dyes, or preservatives -pregnant or trying to get pregnant -breast-feeding How should I use this medicine? This drug is given as an infusion into a vein. It is administered in a hospital or clinic by a specially trained health care  professional. Talk to your pediatrician regarding the use of this medicine in children. This medicine is not approved for use in children. Overdosage: If you think you have taken too much of this medicine contact a poison control center or emergency room at once. NOTE: This medicine is only for you. Do not share this medicine with others. What if I miss a dose? It is important not to miss a dose. Call your doctor or health care professional if you are unable to keep an appointment. What may interact with this medicine? This medicine may interact with the following medications: -certain types of chemotherapy, such as daunorubicin, doxorubicin, epirubicin, and idarubicin This list may not describe all possible interactions. Give your health care provider a list of all the medicines, herbs, non-prescription drugs, or dietary supplements you use. Also tell them if you smoke, drink alcohol, or use illegal drugs. Some items may interact with your medicine. What should I watch for while using this medicine? Visit your doctor for checks on your progress. Report any side effects. Continue your course of treatment even though you feel ill unless your doctor tells you to stop. Call your doctor or health care professional for advice if you get a fever, chills or sore throat, or other symptoms of a cold or flu. Do not treat yourself. Try to avoid being around people who are sick. You may experience fever, chills and shaking during your first infusion. These effects are usually mild and can be treated with other medicines. Report any side effects during the infusion to your health care professional. Fever and chills usually do not happen with later infusions. Do not   become pregnant while taking this medicine or for 7 months after stopping it. Women should inform their doctor if they wish to become pregnant or think they might be pregnant. Women of child-bearing potential will need to have a negative pregnancy test  before starting this medicine. There is a potential for serious side effects to an unborn child. Talk to your health care professional or pharmacist for more information. Do not breast-feed an infant while taking this medicine or for 7 months after stopping it. Women must use effective birth control with this medicine. What side effects may I notice from receiving this medicine? Side effects that you should report to your doctor or health care professional as soon as possible: -allergic reactions like skin rash, itching or hives, swelling of the face, lips, or tongue -chest pain or palpitations -cough -dizziness -feeling faint or lightheaded, falls -fever -general ill feeling or flu-like symptoms -signs of worsening heart failure like breathing problems; swelling in your legs and feet -unusually weak or tired Side effects that usually do not require medical attention (report to your doctor or health care professional if they continue or are bothersome): -bone pain -changes in taste -diarrhea -joint pain -nausea/vomiting -weight loss This list may not describe all possible side effects. Call your doctor for medical advice about side effects. You may report side effects to FDA at 1-800-FDA-1088. Where should I keep my medicine? This drug is given in a hospital or clinic and will not be stored at home. NOTE: This sheet is a summary. It may not cover all possible information. If you have questions about this medicine, talk to your doctor, pharmacist, or health care provider.  2018 Elsevier/Gold Standard (2016-06-05 14:37:52)  Pertuzumab injection What is this medicine? PERTUZUMAB (per TOOZ ue mab) is a monoclonal antibody. It is used to treat breast cancer. This medicine may be used for other purposes; ask your health care provider or pharmacist if you have questions. COMMON BRAND NAME(S): PERJETA What should I tell my health care provider before I take this medicine? They need to know  if you have any of these conditions: -heart disease -heart failure -high blood pressure -history of irregular heart beat -recent or ongoing radiation therapy -an unusual or allergic reaction to pertuzumab, other medicines, foods, dyes, or preservatives -pregnant or trying to get pregnant -breast-feeding How should I use this medicine? This medicine is for infusion into a vein. It is given by a health care professional in a hospital or clinic setting. Talk to your pediatrician regarding the use of this medicine in children. Special care may be needed. Overdosage: If you think you have taken too much of this medicine contact a poison control center or emergency room at once. NOTE: This medicine is only for you. Do not share this medicine with others. What if I miss a dose? It is important not to miss your dose. Call your doctor or health care professional if you are unable to keep an appointment. What may interact with this medicine? Interactions are not expected. Give your health care provider a list of all the medicines, herbs, non-prescription drugs, or dietary supplements you use. Also tell them if you smoke, drink alcohol, or use illegal drugs. Some items may interact with your medicine. This list may not describe all possible interactions. Give your health care provider a list of all the medicines, herbs, non-prescription drugs, or dietary supplements you use. Also tell them if you smoke, drink alcohol, or use illegal drugs. Some items may interact   with your medicine. What should I watch for while using this medicine? Your condition will be monitored carefully while you are receiving this medicine. Report any side effects. Continue your course of treatment even though you feel ill unless your doctor tells you to stop. Do not become pregnant while taking this medicine or for 7 months after stopping it. Women should inform their doctor if they wish to become pregnant or think they might be  pregnant. Women of child-bearing potential will need to have a negative pregnancy test before starting this medicine. There is a potential for serious side effects to an unborn child. Talk to your health care professional or pharmacist for more information. Do not breast-feed an infant while taking this medicine or for 7 months after stopping it. Women must use effective birth control with this medicine. Call your doctor or health care professional for advice if you get a fever, chills or sore throat, or other symptoms of a cold or flu. Do not treat yourself. Try to avoid being around people who are sick. You may experience fever, chills, and headache during the infusion. Report any side effects during the infusion to your health care professional. What side effects may I notice from receiving this medicine? Side effects that you should report to your doctor or health care professional as soon as possible: -breathing problems -chest pain or palpitations -dizziness -feeling faint or lightheaded -fever or chills -skin rash, itching or hives -sore throat -swelling of the face, lips, or tongue -swelling of the legs or ankles -unusually weak or tired Side effects that usually do not require medical attention (report to your doctor or health care professional if they continue or are bothersome): -diarrhea -hair loss -nausea, vomiting -tiredness This list may not describe all possible side effects. Call your doctor for medical advice about side effects. You may report side effects to FDA at 1-800-FDA-1088. Where should I keep my medicine? This drug is given in a hospital or clinic and will not be stored at home. NOTE: This sheet is a summary. It may not cover all possible information. If you have questions about this medicine, talk to your doctor, pharmacist, or health care provider.  2018 Elsevier/Gold Standard (2015-07-14 12:08:50)    

## 2017-09-06 ENCOUNTER — Telehealth (HOSPITAL_COMMUNITY): Payer: Self-pay | Admitting: Vascular Surgery

## 2017-09-06 LAB — CANCER ANTIGEN 27.29: CA 27.29: 31.7 U/mL (ref 0.0–38.6)

## 2017-09-06 NOTE — Telephone Encounter (Signed)
Left pt message giving pt new brst appt w. Echo w/ Dr. Aundra Dubin , asked pt to call back to confirm appt

## 2017-09-12 NOTE — Progress Notes (Signed)
Location of Breast Cancer: Right and Left Breast   Histology per Pathology Report:  04/24/17 Diagnosis Breast, right, needle core biopsy, UOQ, centered at 9:30 o'clock - INVASIVE MAMMARY CARCINOMA.  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  Receptor Status: ER(50%), PR (90%), Her2-neu (NEG), Ki (1%)  08/22/17 Diagnosis 1. Breast, lumpectomy, Left - ATYPICAL DUCTAL HYPERPLASIA - FIBROCYSTIC CHANGES - PREVIOUS BIOPSY SITE CHANGES - SEE COMMENT 2. Lymph node, sentinel, biopsy, Left Axillary #1 - NO CARCINOMA IDENTIFIED IN ONE LYMPH NODE (0/1) - SEE COMMENT 3. Lymph node, sentinel, biopsy, Left Axillary #2 - NO CARCINOMA IDENTIFIED IN ONE LYMPH NODE (0/1) - SEE COMMENT 4. Lymph node, sentinel, biopsy, Left Axillary #3 - NO CARCINOMA IDENTIFIED IN ONE LYMPH NODE (0/1) - SEE COMMENT 5. Lymph node, sentinel, biopsy, Left - NO CARCINOMA IDENTIFIED IN ONE LYMPH NODE (0/1) - SEE COMMENT 6. 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 Lymph node, sentinel, biopsy, Right Axillary #1 - NO CARCINOMA IDENTIFIED IN ONE LYMPH NODE (0/1) - SEE COMMENT 8. Lymph node, sentinel, biopsy, Right - NO CARCINOMA IDENTIFIED IN ONE LYMPH NODE (0/1) - SEE COMMENT 9. Lymph node, sentinel, biopsy, Right - NO CARCINOMA IDENTIFIED IN ONE LYMPH NODE (0/1) - SEE COMMENT 10. Lymph node, sentinel, biopsy, Right Axillary #2 - METASTATIC CARCINOMA PRESENT IN ONE LYMPH NODE (1/1) - SEE COMMENT 11. Lymph node, sentinel, biopsy, Right Axillary #3 - NO CARCINOMA IDENTIFIED IN ONE LYMPH NODE (0/1) - SEE COMMENT 12. Lymph node, sentinel, biopsy, Right - NO CARCINOMA  IDENTIFIED IN ONE LYMPH NODE (0/1)  Did patient present with symptoms or was this found on screening mammography?: She self palpated the mass to her right outer breast in September 2018.   Past/Anticipated interventions by surgeon, if any: 08/22/17 Procedure performed: #1 blue dye injection 2.  Left radioactive seed localized lumpectomy 3.  Left axillary sentinel lymph node biopsy 4.  Right mastectomy with sentinel lymph node biopsy Surgeon:Matthew K Tsuei   Past/Anticipated interventions by medical oncology, if any:  09/05/17 Dr. Burr Medico PLAN:  -Proceed with Herceptin/Perjeta today  -will repeat HER2 test by [facility] on her initial right breast tumor biopsy  -Continue letrozole daily  -Lab, flush, f/u and Herceptin and Perjeta in 3 weeks (may cancel treatment if repeated HER2 negative)  -Rad/onc referral for b/l adjuvant radiation   Lymphedema issues, if any:  She denies. She has good arm mobility.   Pain issues, if any:  She denies  SAFETY ISSUES:  Prior radiation? No  Pacemaker/ICD? No  Possible current pregnancy? No  Is the patient on methotrexate? No  Current Complaints / other details:    BP 137/71   Pulse 90   Temp 98 F (36.7 C)   Ht '5\' 2"'  (1.575 m)   Wt 123 lb 6.4 oz (56 kg)   SpO2 98% Comment: room air  BMI 22.57 kg/m    Wt Readings from Last 3 Encounters:  09/17/17 123 lb 6.4 oz (56 kg)  09/05/17 123 lb 1.6 oz (55.8 kg)  08/22/17 122 lb (55.3 kg)      Seif Teichert, Stephani Police, RN 09/12/2017,2:53 PM

## 2017-09-13 ENCOUNTER — Ambulatory Visit (HOSPITAL_COMMUNITY): Payer: Medicare Other | Admitting: Cardiology

## 2017-09-13 ENCOUNTER — Other Ambulatory Visit (HOSPITAL_COMMUNITY): Payer: Medicare Other

## 2017-09-17 ENCOUNTER — Encounter: Payer: Self-pay | Admitting: Radiation Oncology

## 2017-09-17 ENCOUNTER — Ambulatory Visit
Admission: RE | Admit: 2017-09-17 | Discharge: 2017-09-17 | Disposition: A | Discharge status: Home / Self Care | Payer: Medicare Other | Source: Ambulatory Visit | Attending: Radiation Oncology | Admitting: Radiation Oncology

## 2017-09-17 ENCOUNTER — Other Ambulatory Visit: Payer: Self-pay

## 2017-09-17 VITALS — BP 137/71 | HR 90 | Temp 98.0°F | Ht 62.0 in | Wt 123.4 lb

## 2017-09-17 DIAGNOSIS — C50112 Malignant neoplasm of central portion of left female breast: Secondary | ICD-10-CM | POA: Diagnosis not present

## 2017-09-17 DIAGNOSIS — C50811 Malignant neoplasm of overlapping sites of right female breast: Secondary | ICD-10-CM | POA: Diagnosis not present

## 2017-09-17 DIAGNOSIS — C50411 Malignant neoplasm of upper-outer quadrant of right female breast: Secondary | ICD-10-CM | POA: Diagnosis not present

## 2017-09-17 DIAGNOSIS — I10 Essential (primary) hypertension: Secondary | ICD-10-CM | POA: Diagnosis not present

## 2017-09-17 DIAGNOSIS — E785 Hyperlipidemia, unspecified: Secondary | ICD-10-CM | POA: Insufficient documentation

## 2017-09-17 DIAGNOSIS — Z170 Estrogen receptor positive status [ER+]: Secondary | ICD-10-CM

## 2017-09-17 DIAGNOSIS — Z9221 Personal history of antineoplastic chemotherapy: Secondary | ICD-10-CM | POA: Diagnosis not present

## 2017-09-17 DIAGNOSIS — C50511 Malignant neoplasm of lower-outer quadrant of right female breast: Secondary | ICD-10-CM | POA: Diagnosis not present

## 2017-09-17 DIAGNOSIS — Z79899 Other long term (current) drug therapy: Secondary | ICD-10-CM | POA: Diagnosis not present

## 2017-09-17 NOTE — Progress Notes (Signed)
Radiation Oncology         [phone] ________________________________  Initial Outpatient Consultation  Name: Madison Stokes MRN: [id number]  Date: 09/17/2017  DOB: December 21, 1945  OI:[id number], Gwyndolyn Saxon, MD  [name], MD   REFERRING PHYSICIAN: [name], MD  DIAGNOSIS:    ICD-10-CM   1. Cancer of central portion of left breast ([place]) C50.112   2. Malignant neoplasm of overlapping sites of right breast in female, estrogen receptor positive ([place]) C50.811    Z17.0   3. Carcinoma of upper-outer quadrant of right breast in female, estrogen receptor positive ([place]) C50.411    Z17.0    Clinical Stage IB, T3 N0 M0, Grade 2-3 Right Breast UOQ-LOQ Invasive Lobular Carcinoma, ER 100% / PR 100% / Her2(+), ypT3N0(i+) Her2 negative post-chemo  And  Clinical Stage IA, T1a N0 M0, Grade 1-2 Left Breast Central-LOQ Invasive Ductal Carcinoma, ER 50%/ PR 90% / Her2(-), ypT0N0  CHIEF COMPLAINT: Here to discuss management of bilateral breast cancer  HISTORY OF PRESENT ILLNESS::Madison Stokes is a 72 y.o. female who presented with a self-palpated mass to her right outer breast in September 2018. She was evaluated with diagnostic mammogram and ultrasound of the right breast on 03/08/2017. She was noted to have a palpable mass in the lateral right breast in a location of dense fibroglandular breast tissue. Although no discrete mass was seen sonographically or mammographically, the clinical change remained concerning. She was therefore further evaluated with a biopsy of the UOQ right breast mass on 04/24/2017 that showed grade 2 invasive lobular carcinoma that was ER positive, PR positive, Her2 positive, and Ki-67 10%.  She subsequently had an MRI of her bilateral breasts done on 05/02/2017 that showed the known right breast lobular carcinoma, spanning 5.9 x 3.2 x 5.6 cm, involving the upper outer and lower outer quadrants. There was also a 4 mm enhancing mass with associated architectural distortion in the central  left breast. There were no abnormal appearing lymph nodes. A mammogram of the left breast was performed on 05/03/2017 and showed the suspicious area of architectural distortion in the left breast corresponding to the mass on MRI. She underwent biopsy of the left central, slightly LOQ breast mass on 05/03/2017 which revealed grade 1/2 invasive ductal carcinoma that was ER positive, PR positive, Her2 negative, and Ki-67 1%.     Staging workup includes a CT scan of the chest, abdomen, pelvis and a bone scan on 05/22/2017.  CT showed no specific findings of nodal or other metastatic disease, and bone scan showed no findings of osseous metastatic disease. Renal cysts noted.  She was seen by Dr. Burr Medico in medical oncology to start neoadjuvant chemotherapy. She was planned for [facility] every 3 weeks for 6 cycles starting 05/24/2017 followed by a year of maintenance Herceptin and Perjeta.  MRI of the bilateral breasts on 08/10/2017 showed no significant change in the known lobular carcinoma of the right breast since prior MRI from 05/02/2018. The disease spanned approximately 5.6 cm, previously 5.9 cm in the anterior to posterior dimension. There was a slight decrease in size of the invasive ductal carcinoma in the left breast measuring 1 mm, previously 4 mm on prior MRI. No new suspicious findings in either breast.   She completed 4 cycles of TCHP, but given lack of response in the right breast on MRI, her TCHP was discontinued. Her last cycle was on 07/26/2017. She then was started on anti-estrogen oral therapy with daily letrozole and proceeded to maintenance therapy with Herceptin and Perjeta  on 08/16/2017. Herceptin and Perjeta were last given on 09/05/2017.  She underwent right mastectomy and left breast lumpectomy with bilateral sentinel lymph node biopsy by Dr. Georgette Dover on 08/22/2017. Final pathology revealed a complete response to chemotherapy with no residual carcinoma in the left breast, and 4 left axillary lymph nodes  were negative. Pathology from the right mastectomy revealed grade 2 invasive lobular carcinoma, measuring 6.0 cm in greatest dimension, with negative margins to 0.9 cm. Of 6 right axillary lymph nodes biopsied, 1 was positive for isolated tumor cells and measured 1 mm. Her disease was initially ER positive, PR positive, Her2 positive, and Ki-67 10%.  Of note, Her2 was retested per Dr. Ernestina Penna orders on 09/05/2017 and came back negative.   The patient has been referred today for discussion of bilateral breast adjuvant radiation treatment. She is accompanied by her friend who has also been treated for breast cancer.   On review of systems, the patient denies any pain or lymphedema issues and has good arm mobility following surgery.   PREVIOUS RADIATION THERAPY: No  PAST MEDICAL HISTORY:  has a past medical history of Anemia, Cancer ([place]) (04/2017), Hyperlipidemia, Hypertension, and PONV (postoperative nausea and vomiting).    PAST SURGICAL HISTORY: Past Surgical History:  Procedure Laterality Date  . BREAST LUMPECTOMY WITH RADIOACTIVE SEED AND SENTINEL LYMPH NODE BIOPSY Left 08/22/2017   Procedure: LEFT BREAST LUMPECTOMY WITH RADIOACTIVE SEED AND SENTINEL LYMPH NODE BIOPSY ERAS PATHWAY;  Surgeon: [name], MD;  Location: [facility];  Service: General;  Laterality: Left;  PECTORAL BLOCK  . COLONOSCOPY WITH PROPOFOL Left 07/14/2017   Procedure: COLONOSCOPY WITH PROPOFOL;  Surgeon: Ronnette Juniper, MD;  Location: [facility];  Service: Gastroenterology;  Laterality: Left;  . DILATION AND CURETTAGE OF UTERUS    . ESOPHAGOGASTRODUODENOSCOPY N/A 07/13/2017   Procedure: ESOPHAGOGASTRODUODENOSCOPY (EGD);  Surgeon: Ronnette Juniper, MD;  Location: [facility];  Service: Gastroenterology;  Laterality: N/A;  . MASTECTOMY W/ SENTINEL NODE BIOPSY Right 08/22/2017   Procedure: RIGHT MASTECTOMY WITH SENTINEL LYMPH NODE BIOPSY ERAS PATHWAY;  Surgeon: [name], MD;  Location: [facility];  Service: General;  Laterality:  Right;  PECTORAL BLOCK  . PORTACATH PLACEMENT Right 05/14/2017   Procedure: ULTRASOUND GUIDED PORT PLACEMENT;  Surgeon: [name], MD;  Location: [facility];  Service: General;  Laterality: Right;    FAMILY HISTORY: family history includes Cancer in her brother; Cancer (age of onset: 21) in her mother; Diabetes in her father; Heart attack in her father; Heart disease in her father; Hypertension in her father.  SOCIAL HISTORY:  reports that she has never smoked. She has never used smokeless tobacco. She reports that she drinks alcohol. She reports that she does not use drugs.  ALLERGIES: Patient has no known allergies.  MEDICATIONS:  Current Outpatient Medications  Medication Sig Dispense Refill  . irbesartan (AVAPRO) 150 MG tablet Take 150 mg by mouth daily.     Marland Kitchen letrozole (FEMARA) 2.5 MG tablet Take 1 tablet (2.5 mg total) by mouth daily. 30 tablet 3  . lidocaine-prilocaine (EMLA) cream Apply to affected area once (Patient taking differently: Apply 1 application topically daily as needed (prior to port being accessed. (every 3 weeks)). Apply to affected area once) 30 g 3  . loperamide (IMODIUM) 2 MG capsule Take 2-4 mg by mouth 4 (four) times daily as needed for diarrhea or loose stools.     Marland Kitchen loratadine (CLARITIN) 10 MG tablet Take 10 mg by mouth daily as needed for allergies.    Marland Kitchen  Multiple Vitamin (MULTIVITAMIN) tablet Take 1 tablet daily by mouth.    . simvastatin (ZOCOR) 40 MG tablet Take 40 mg at bedtime by mouth.    . ondansetron (ZOFRAN ODT) 8 MG disintegrating tablet Take 1 tablet (8 mg total) by mouth every 8 (eight) hours as needed for nausea or vomiting. Start on Day 3 post chemo (Patient not taking: Reported on 08/19/2017) 20 tablet 0  . oxyCODONE (OXY IR/ROXICODONE) 5 MG immediate release tablet Take 1 tablet (5 mg total) by mouth every 6 (six) hours as needed for moderate pain. (Patient not taking: Reported on 09/05/2017) 20 tablet 0  . prochlorperazine  (COMPAZINE) 10 MG tablet Take 1 tablet (10 mg total) every 6 (six) hours as needed by mouth (Nausea or vomiting). (Patient not taking: Reported on 08/19/2017) 30 tablet 1   No current facility-administered medications for this encounter.     REVIEW OF SYSTEMS: All pertinent positives are noted in the HPI.   PHYSICAL EXAM:  height is '5\' 2"'  (1.575 m) and weight is 123 lb 6.4 oz (56 kg). Her temperature is 98 F (36.7 C). Her blood pressure is 137/71 and her pulse is 90. Her oxygen saturation is 98%.   General: Alert and oriented, in no acute distress. HEENT: Head is normocephalic. Extraocular movements are intact. Oropharynx is clear. Neck: Neck is supple, no palpable cervical or supraclavicular lymphadenopathy. Heart: Regular in rate and rhythm with no murmurs, rubs, or gallops. Vascular: She has a right upper chest Port-A-Cath. Chest: Clear to auscultation bilaterally, with no rhonchi, wheezes, or rales.  Abdomen: Soft, nontender, nondistended, with no rigidity or guarding. Extremities: No cyanosis or edema. Lymphatics: see Neck Exam Skin: No concerning lesions. Musculoskeletal: Good ROM in bilateral shoulders. Neurologic: Cranial nerves II through XII are grossly intact. No obvious focalities. Speech is fluent. Coordination is intact. Psychiatric: Judgment and insight are intact. Affect is appropriate. Breasts: Her bilateral axillary scars as well as the right mastectomy and left lumpectomy scars have healed well.     ECOG = 1  0 - Asymptomatic (Fully active, able to carry on all predisease activities without restriction)  1 - Symptomatic but completely ambulatory (Restricted in physically strenuous activity but ambulatory and able to carry out work of a light or sedentary nature. For example, light housework, office work)  2 - Symptomatic, <50% in bed during the day (Ambulatory and capable of all self care but unable to carry out any work activities. Up and about more than 50% of  waking hours)  3 - Symptomatic, >50% in bed, but not bedbound (Capable of only limited self-care, confined to bed or chair 50% or more of waking hours)  4 - Bedbound (Completely disabled. Cannot carry on any self-care. Totally confined to bed or chair)  5 - Death   Eustace Pen MM, Creech RH, Tormey DC, et al. [phone]). "Toxicity and response criteria of the [facility]". [place] Oncol. 5 (6): 649-55   LABORATORY DATA:  Lab Results  Component Value Date   WBC 3.8 (L) 09/05/2017   HGB 8.0 (L) 09/05/2017   HCT 24.5 (L) 09/05/2017   MCV 98.8 09/05/2017   PLT 225 09/05/2017   CMP     Component Value Date/Time   NA 143 09/05/2017 1046   NA 142 06/13/2017 1307   K 4.2 09/05/2017 1046   K 4.1 06/13/2017 1307   CL 110 (H) 09/05/2017 1046   CO2 26 09/05/2017 1046   CO2 23 06/13/2017 1307  GLUCOSE 134 09/05/2017 1046   GLUCOSE 224 (H) 06/13/2017 1307   BUN 15 09/05/2017 1046   BUN 17.3 06/13/2017 1307   CREATININE 0.79 09/05/2017 1046   CREATININE 0.79 07/12/2017 0902   CREATININE 0.9 06/13/2017 1307   CALCIUM 8.7 09/05/2017 1046   CALCIUM 8.9 06/13/2017 1307   PROT 5.9 (L) 09/05/2017 1046   PROT 6.4 06/13/2017 1307   ALBUMIN 3.4 (L) 09/05/2017 1046   ALBUMIN 3.5 06/13/2017 1307   AST 30 09/05/2017 1046   AST 20 07/12/2017 0902   AST 31 06/13/2017 1307   ALT 39 09/05/2017 1046   ALT 28 07/12/2017 0902   ALT 50 06/13/2017 1307   ALKPHOS 97 09/05/2017 1046   ALKPHOS 124 06/13/2017 1307   BILITOT 0.2 09/05/2017 1046   BILITOT <0.2 (L) 07/12/2017 0902   BILITOT 0.24 06/13/2017 1307   GFRNONAA >60 09/05/2017 1046   GFRNONAA >60 07/12/2017 0902   GFRAA >60 09/05/2017 1046   GFRAA >60 07/12/2017 0902         RADIOGRAPHY: Nm Sentinel Node Inj-no Rpt (breast)  Result Date: 08/22/2017 Sulfur colloid was injected by the nuclear medicine technologist for melanoma sentinel node.   Mm Breast Surgical Specimen  Result Date: 08/22/2017 CLINICAL DATA:   Biopsy-proven invasive ductal carcinoma involving the lower outer quadrant of the left breast. Radioactive seed localization was performed yesterday in anticipation of today's lumpectomy. EXAM: SPECIMEN RADIOGRAPH OF THE LEFT BREAST COMPARISON:  Previous exam(s). FINDINGS: Status post excision of the left breast. The radioactive seed and the coil shaped biopsy marker clip are present, are completely intact, and are marked for pathology. This was discussed with the operating room nurse at the time of interpretation on 08/22/2017 at 11:14 a.m. IMPRESSION: Specimen radiograph of the left breast. Electronically Signed   By: Evangeline Dakin M.D.   On: 08/22/2017 11:17   Mm Lt Radioactive Seed Loc Mammo Guide  Result Date: 08/21/2017 CLINICAL DATA:  72 year old female presenting for radioactive seed localization of the left breast prior to lumpectomy. EXAM: MAMMOGRAPHIC GUIDED RADIOACTIVE SEED LOCALIZATION OF THE LEFT BREAST COMPARISON:  Previous exam(s). FINDINGS: Patient presents for radioactive seed localization prior to . I met with the patient and we discussed the procedure of seed localization including benefits and alternatives. We discussed the high likelihood of a successful procedure. We discussed the risks of the procedure including infection, bleeding, tissue injury and further surgery. We discussed the low dose of radioactivity involved in the procedure. Informed, written consent was given. The usual time-out protocol was performed immediately prior to the procedure. Using mammographic guidance, sterile technique, 1% lidocaine and an I-125 radioactive seed, the subtle distorted mass in the left breast superior to the coil shaped biopsy marking clip was localized using a superior approach. The follow-up mammogram images confirm the seed in the expected location which is approximately 8 mm superior to the coil shaped biopsy marking clip and were marked for Dr. Georgette Dover. Follow-up survey of the patient  confirms presence of the radioactive seed. Order number of I-125 seed:  [id number]. Total activity:  2.423 millicuries reference Date: 08/20/2017 The patient tolerated the procedure well and was released from the [place]. She was given instructions regarding seed removal. IMPRESSION: Radioactive seed localization left breast. Note that the coil shaped biopsy marking clip was inferiorly displaced at the time of biopsy, therefore the seed was placed at the expected location of the mass, and is approximately 8 mm superior to the biopsy marking clip. No apparent complications. Electronically  Signed   By: Ammie Ferrier M.D.   On: 08/21/2017 14:54      IMPRESSION/PLAN: Clinical Stage IB, T3 N0 M0, Grade 2-3 Right Breast UOQ-LOQ Invasive Lobular Carcinoma, ER 100% / PR 100% / Her2(+), ypT3N0(i+) Her2 negative post-chemo  And  Clinical Stage IA, T1a N0 M0, Grade 1-2 Left Breast Central-LOQ Invasive Ductal Carcinoma, ER 50%/ PR 90% / Her2(-), ypT0N0.  It was a pleasure meeting the patient today. We discussed the risks, benefits, and side effects of radiotherapy. I recommend radiotherapy to the right chest wall, right axillary lymph nodes, right supraclavicular area, and the left breast to reduce her risk of locoregional recurrence by 2/3.  We discussed that radiation would take approximately 6 weeks to complete. We spoke about acute effects including skin irritation and fatigue as well as much less common late effects including internal organ injury or irritation. We spoke about the latest technology that is used to minimize the risk of late effects for patients undergoing radiotherapy to the breast or chest wall. No guarantees of treatment were given. The patient is enthusiastic about proceeding with treatment. I look forward to participating in the patient's care.  Her CT simulation/treatment planning is scheduled for today with treatment to begin in approximately 1 week. We discussed and signed the  radiation oncology consent form, and a copy was retained for our records.  I sent a note to Dr. Burr Medico regarding the possibility of discontinuing the patient's Herceptin given the negative Her2 finding after surgery.  I spent 35 minutes face to face with the patient and more than 50% of that time was spent in counseling and/or coordination of care.   __________________________________________   Eppie Gibson, MD  This document serves as a record of services personally performed by Eppie Gibson, MD. It was created on her behalf by Rae Lips, a trained medical scribe. The creation of this record is based on the scribe's personal observations and the provider's statements to them. This document has been checked and approved by the attending provider.

## 2017-09-18 ENCOUNTER — Telehealth: Payer: Self-pay | Admitting: Hematology

## 2017-09-18 NOTE — Progress Notes (Signed)
  Radiation Oncology         [phone] ________________________________  Name: Madison Stokes MRN: [id number]  Date: 09/17/2017  DOB: [birth year]  SIMULATION AND TREATMENT PLANNING NOTE    Outpatient  DIAGNOSIS:     ICD-10-CM   1. Cancer of central portion of left breast ([place]) C50.112   2. Carcinoma of upper-outer quadrant of right breast in female, estrogen receptor positive ([place]) C50.411    Z17.0   3. Malignant neoplasm of overlapping sites of right breast in female, estrogen receptor positive ([place]) C50.811    Z17.0     NARRATIVE:  The patient was brought to the [place].  Identity was confirmed.  All relevant records and images related to the planned course of therapy were reviewed.  The patient freely provided informed written consent to proceed with treatment after reviewing the details related to the planned course of therapy. The consent form was witnessed and verified by the simulation staff.    Then, the patient was set-up in a stable reproducible supine position for radiation therapy with her ipsilateral arm over her head, and her upper body secured in a custom-made Vac-lok device.  CT images were obtained.  Surface markings were placed.  The CT images were loaded into the planning software.    TREATMENT PLANNING NOTE: Treatment planning then occurred.  The radiation prescription was entered and confirmed.     A total of 7 medically necessary complex treatment devices were fabricated and supervised by me: 6 fields with MLCs for custom blocks to protect heart, and lungs;  and, a Vac-lok. MORE COMPLEX DEVICES MAY BE MADE IN DOSIMETRY FOR FIELD IN FIELD BEAMS FOR DOSE HOMOGENEITY.  I have requested : 3D Simulation which is medically necessary to give adequate dose to at risk tissues while sparing lungs and heart.  I have requested a DVH of the following structures: lungs, heart, esophagus, cord, left lumpectomy cavity.    The patient will receive 50 Gy in 25  fractions to the left breast and right chest wall with bilateral sets of 2 tangential fields (4 fields).  Another 2 fields will be opposed to treat the right PAB/SCV region to 45Gy in 25 fractions.  This will be followed by a boost to the chest wall scar on the right; left breast boost is not recommended.  Optical Surface Tracking Plan:  Since intensity modulated radiotherapy (IMRT) and 3D conformal radiation treatment methods are predicated on accurate and precise positioning for treatment, intrafraction motion monitoring is medically necessary to ensure accurate and safe treatment delivery. The ability to quantify intrafraction motion without excessive ionizing radiation dose can only be performed with optical surface tracking. Accordingly, surface imaging offers the opportunity to obtain 3D measurements of patient position throughout IMRT and 3D treatments without excessive radiation exposure. I am ordering optical surface tracking for this patient's upcoming course of radiotherapy.  ________________________________   Reference:  Ursula Alert, J, et al. Surface imaging-based analysis of intrafraction motion for breast radiotherapy patients.Journal of [place], n. 6, nov. 2014. ISSN [id number].  Available at: <http://www.jacmp.org/index.php/jacmp/article/view/4957>.    -----------------------------------  Eppie Gibson, MD

## 2017-09-18 NOTE — Telephone Encounter (Signed)
I called pt and discussed her HER2 IHC test results. The IHC on initial biopsy was 1+, and surgical sample was 0. Based on this and the [place] test, her tumor is HER2 NEGATIVE according to the new ASCO 2018 HER2 test criteria. She clinically did not respond to neoadjuvant chemo and dual HER-2 antibodies, so I recommend her to stop maintenance herceptin and pejeta, will discuss with her again when she returns next week for follow-up.  She agrees with the plan.  Madison Stokes  09/18/2017

## 2017-09-20 ENCOUNTER — Ambulatory Visit (HOSPITAL_BASED_OUTPATIENT_CLINIC_OR_DEPARTMENT_OTHER)
Admission: RE | Admit: 2017-09-20 | Discharge: 2017-09-20 | Disposition: A | Discharge status: Home / Self Care | Payer: Medicare Other | Source: Ambulatory Visit | Attending: Cardiology | Admitting: Cardiology

## 2017-09-20 ENCOUNTER — Encounter (HOSPITAL_COMMUNITY): Payer: Self-pay | Admitting: Cardiology

## 2017-09-20 ENCOUNTER — Ambulatory Visit (HOSPITAL_COMMUNITY)
Admission: RE | Admit: 2017-09-20 | Discharge: 2017-09-20 | Disposition: A | Discharge status: Home / Self Care | Payer: Medicare Other | Source: Ambulatory Visit | Attending: Hematology | Admitting: Hematology

## 2017-09-20 VITALS — BP 151/81 | HR 77 | Wt 123.5 lb

## 2017-09-20 DIAGNOSIS — Z170 Estrogen receptor positive status [ER+]: Secondary | ICD-10-CM

## 2017-09-20 DIAGNOSIS — C50411 Malignant neoplasm of upper-outer quadrant of right female breast: Secondary | ICD-10-CM | POA: Diagnosis not present

## 2017-09-20 DIAGNOSIS — C50811 Malignant neoplasm of overlapping sites of right female breast: Secondary | ICD-10-CM | POA: Insufficient documentation

## 2017-09-20 DIAGNOSIS — I340 Nonrheumatic mitral (valve) insufficiency: Secondary | ICD-10-CM | POA: Insufficient documentation

## 2017-09-20 DIAGNOSIS — T451X5D Adverse effect of antineoplastic and immunosuppressive drugs, subsequent encounter: Secondary | ICD-10-CM

## 2017-09-20 NOTE — Progress Notes (Signed)
  Echocardiogram 2D Echocardiogram has been performed.  Madison Stokes 09/20/2017, 8:59 AM

## 2017-09-20 NOTE — Patient Instructions (Signed)
Your physician has requested that you have an echocardiogram. Echocardiography is a painless test that uses sound waves to create images of your heart. It provides your doctor with information about the size and shape of your heart and how well your heart's chambers and valves are working. This procedure takes approximately one hour. There are no restrictions for this procedure.  Your physician recommends that you schedule a follow-up appointment in: 3 months with Dr. Aundra Dubin  an a echocardiogram (July, 2019) Please Call an Schedule Appointment

## 2017-09-22 NOTE — Progress Notes (Deleted)
Cardiology Office Note   Date:  09/22/2017   ID:  Yina Riviere, DOB May 07, 1946, MRN [id number]  PCP:  [name], MD  Cardiologist:   Skeet Latch, MD   No chief complaint on file.     History of Present Illness: Madison Stokes is a 72 y.o. female with diabetes, hypertension, hyperlipidemia and breast cancer who is being seen today for the evaluation of *** at the request of [name], MD.   She had an echo 05/15/17 that revealed LVEF 60-65% with GLS -20%.  Subsequent echo 09/20/17 revealed LVEF 35% with an increase in GLS to -18%.    If a decision is made to proceed with therapy in a patient with baseline LVEF ?50 percent. ?If a patient receiving anthracycline therapy develops HF or LVEF ?40 percent or fall in LVEF ?15 absolute percentage points to LVEF <50 percent. ?symptomatic HF   Past Medical History:  Diagnosis Date  . Anemia    with chemo  . Cancer ([place]) 04/2017   Bil Breast Ca  . Hyperlipidemia   . Hypertension   . PONV (postoperative nausea and vomiting)    nausea only    Past Surgical History:  Procedure Laterality Date  . BREAST LUMPECTOMY WITH RADIOACTIVE SEED AND SENTINEL LYMPH NODE BIOPSY Left 08/22/2017   Procedure: LEFT BREAST LUMPECTOMY WITH RADIOACTIVE SEED AND SENTINEL LYMPH NODE BIOPSY ERAS PATHWAY;  Surgeon: [name], MD;  Location: [facility];  Service: General;  Laterality: Left;  PECTORAL BLOCK  . COLONOSCOPY WITH PROPOFOL Left 07/14/2017   Procedure: COLONOSCOPY WITH PROPOFOL;  Surgeon: Ronnette Juniper, MD;  Location: [facility];  Service: Gastroenterology;  Laterality: Left;  . DILATION AND CURETTAGE OF UTERUS    . ESOPHAGOGASTRODUODENOSCOPY N/A 07/13/2017   Procedure: ESOPHAGOGASTRODUODENOSCOPY (EGD);  Surgeon: Ronnette Juniper, MD;  Location: [facility];  Service: Gastroenterology;  Laterality: N/A;  . MASTECTOMY W/ SENTINEL NODE BIOPSY Right 08/22/2017   Procedure: RIGHT MASTECTOMY WITH SENTINEL LYMPH NODE BIOPSY ERAS PATHWAY;  Surgeon:  [name], MD;  Location: [facility];  Service: General;  Laterality: Right;  PECTORAL BLOCK  . PORTACATH PLACEMENT Right 05/14/2017   Procedure: ULTRASOUND GUIDED PORT PLACEMENT;  Surgeon: [name], MD;  Location: [facility];  Service: General;  Laterality: Right;     Current Outpatient Medications  Medication Sig Dispense Refill  . irbesartan (AVAPRO) 150 MG tablet Take 150 mg by mouth daily.     Marland Kitchen letrozole (FEMARA) 2.5 MG tablet Take 1 tablet (2.5 mg total) by mouth daily. 30 tablet 3  . lidocaine-prilocaine (EMLA) cream Apply to affected area once (Patient taking differently: Apply 1 application topically daily as needed (prior to port being accessed. (every 3 weeks)). Apply to affected area once) 30 g 3  . loperamide (IMODIUM) 2 MG capsule Take 2-4 mg by mouth 4 (four) times daily as needed for diarrhea or loose stools.     Marland Kitchen loratadine (CLARITIN) 10 MG tablet Take 10 mg by mouth daily as needed for allergies.    . Multiple Vitamin (MULTIVITAMIN) tablet Take 1 tablet daily by mouth.    . ondansetron (ZOFRAN ODT) 8 MG disintegrating tablet Take 1 tablet (8 mg total) by mouth every 8 (eight) hours as needed for nausea or vomiting. Start on Day 3 post chemo 20 tablet 0  . oxyCODONE (OXY IR/ROXICODONE) 5 MG immediate release tablet Take 1 tablet (5 mg total) by mouth every 6 (six) hours as needed for moderate pain. 20 tablet 0  . prochlorperazine (  COMPAZINE) 10 MG tablet Take 1 tablet (10 mg total) every 6 (six) hours as needed by mouth (Nausea or vomiting). 30 tablet 1  . simvastatin (ZOCOR) 40 MG tablet Take 40 mg at bedtime by mouth.     No current facility-administered medications for this visit.     Allergies:   Patient has no known allergies.    Social History:  The patient  reports that she has never smoked. She has never used smokeless tobacco. She reports that she drinks alcohol. She reports that she does not use drugs.   Family History:  The patient's  ***family history includes Cancer in her brother; Cancer (age of onset: 19) in her mother; Diabetes in her father; Heart attack in her father; Heart disease in her father; Hypertension in her father.    ROS:  Please see the history of present illness.   Otherwise, review of systems are positive for {NONE DEFAULTED:18576::"none"}.   All other systems are reviewed and negative.    PHYSICAL EXAM: VS:  There were no vitals taken for this visit. , BMI There is no height or weight on file to calculate BMI. GENERAL:  Well appearing HEENT:  Pupils equal round and reactive, fundi not visualized, oral mucosa unremarkable NECK:  No jugular venous distention, waveform within normal limits, carotid upstroke brisk and symmetric, no bruits, no thyromegaly LYMPHATICS:  No cervical adenopathy LUNGS:  Clear to auscultation bilaterally HEART:  RRR.  PMI not displaced or sustained,S1 and S2 within normal limits, no S3, no S4, no clicks, no rubs, *** murmurs ABD:  Flat, positive bowel sounds normal in frequency in pitch, no bruits, no rebound, no guarding, no midline pulsatile mass, no hepatomegaly, no splenomegaly EXT:  2 plus pulses throughout, no edema, no cyanosis no clubbing SKIN:  No rashes no nodules NEURO:  Cranial nerves II through XII grossly intact, motor grossly intact throughout PSYCH:  Cognitively intact, oriented to person place and time    EKG:  EKG {ACTION; IS/IS ZOX:09604540} ordered today. The ekg ordered today demonstrates ***  Echo 09/20/17: Study Conclusions  - Left ventricle: Global LV longitudinal strain is normal at -18.6%   The cavity size was normal. Systolic function was normal. The   estimated ejection fraction was 55%. Wall motion was normal;   there were no regional wall motion abnormalities. There was an   increased relative contribution of atrial contraction to   ventricular filling. Doppler parameters are consistent with   abnormal left ventricular relaxation (grade 1  diastolic   dysfunction). - Mitral valve: There was mild regurgitation. - Pulmonic valve: There was mild regurgitation.  Impressions:  - Compared to prior study, LV strain has increased from -20% to   -18%  Echo 05/15/17:  Study Conclusions  - Left ventricle: The cavity size was normal. Wall thickness was   normal. Systolic function was normal. The estimated ejection   fraction was in the range of 60% to 65%. Normal GLS at -20%. Wall   motion was normal; there were no regional wall motion   abnormalities. Doppler parameters are consistent with abnormal   left ventricular relaxation (grade 1 diastolic dysfunction). The   E/e&' ratio is <8, suggesting normal LV filling pressure. - Mitral valve: Mildly thickened leaflets . There was mild   regurgitation. - Left atrium: The atrium was normal in size. - Inferior vena cava: The vessel was normal in size. The   respirophasic diameter changes were in the normal range (>= 50%),   consistent with  normal central venous pressure.  Impressions:  - LVEF 60-65%, normal wall thickness, normal wall motion and GLS   strain, grade 1 DD, normal LV filling pressure, mild MR, normal   LA size, normal IVC.  Recent Labs: 09/05/2017: ALT 39; BUN 15; Creatinine, Ser 0.79; Hemoglobin 8.0; Platelets 225; Potassium 4.2; Sodium 143    Lipid Panel No results found for: CHOL, TRIG, HDL, CHOLHDL, VLDL, LDLCALC, LDLDIRECT    Wt Readings from Last 3 Encounters:  09/20/17 123 lb 8 oz (56 kg)  09/17/17 123 lb 6.4 oz (56 kg)  09/05/17 123 lb 1.6 oz (55.8 kg)      ASSESSMENT AND PLAN:  ***   Current medicines are reviewed at length with the patient today.  The patient {ACTIONS; HAS/DOES NOT HAVE:19233} concerns regarding medicines.  The following changes have been made:  {PLAN; NO CHANGE:13088:s}  Labs/ tests ordered today include: *** No orders of the defined types were placed in this encounter.    Disposition:   FU with ***    This  note was written with the assistance of speech recognition software.  Please excuse any transcriptional errors.  Signed, Fahmida Jurich C. Oval Linsey, MD, [facility]  09/22/2017 6:04 PM    [place]

## 2017-09-22 NOTE — Progress Notes (Signed)
Oncology: Dr. Burr Medico  72 yo with history of HTN and breast cancer was referred by Dr. Burr Medico for cardio-oncology evaluation. Breast cancer was diagnosed 10/18 in right breast, ER+/PR+/HER+.  Subsequently found to have cancer in left breast, ER+/PR+/HER2-.  TCHP started in 11/18 x 4 cycles, now getting Herceptin and Perjeta to complete 1 year.  She had right mastectomy and left lumpectomy in 2/19.   No prior cardiac history but does have HTN and hyperlipidemia.  Her father had an MI at age 24.  She has never smoked.  No exertional dyspnea or chest pain.  Good exercise tolerance.   PMH: 1. HTN 2. Hyperlipidemia 3. H/o GI bleeding 4. Breast cancer: Diagnosed 10/18 in right breast, ER+/PR+/HER+.  Subsequently found to have cancer in left breast, ER+/PR+/HER2-.  TCHP started in 11/18 x 4 cycles, now getting Herceptin and Perjeta to complete 1 year.  She had right mastectomy and left lumpectomy in 2/19.  - Echo (11/18): EF 60-65%, GLS -20%. - Echo (3/19): EF 55%, GLS -18%  Social History   Socioeconomic History  . Marital status: Married    Spouse name: Not on file  . Number of children: Not on file  . Years of education: Not on file  . Highest education level: Not on file  Occupational History  . Not on file  Social Needs  . Financial resource strain: Not on file  . Food insecurity:    Worry: Not on file    Inability: Not on file  . Transportation needs:    Medical: Not on file    Non-medical: Not on file  Tobacco Use  . Smoking status: Never Smoker  . Smokeless tobacco: Never Used  Substance and Sexual Activity  . Alcohol use: Yes    Comment: 1 glass of wine per month  . Drug use: No  . Sexual activity: Not on file  Lifestyle  . Physical activity:    Days per week: Not on file    Minutes per session: Not on file  . Stress: Not on file  Relationships  . Social connections:    Talks on phone: Not on file    Gets together: Not on file    Attends religious service: Not on file    Active member of club or organization: Not on file    Attends meetings of clubs or organizations: Not on file    Relationship status: Not on file  . Intimate partner violence:    Fear of current or ex partner: Not on file    Emotionally abused: Not on file    Physically abused: Not on file    Forced sexual activity: Not on file  Other Topics Concern  . Not on file  Social History Narrative  . Not on file   Family History  Problem Relation Age of Onset  . Cancer Mother 67       uterine  . Cancer Brother        prostate  . Heart disease Father   . Heart attack Father   . Diabetes Father   . Hypertension Father    ROS: All systems reviewed and negative except as per HPI.   Current Outpatient Medications  Medication Sig Dispense Refill  . irbesartan (AVAPRO) 150 MG tablet Take 150 mg by mouth daily.     Marland Kitchen letrozole (FEMARA) 2.5 MG tablet Take 1 tablet (2.5 mg total) by mouth daily. 30 tablet 3  . lidocaine-prilocaine (EMLA) cream Apply to affected area once (Patient  taking differently: Apply 1 application topically daily as needed (prior to port being accessed. (every 3 weeks)). Apply to affected area once) 30 g 3  . loperamide (IMODIUM) 2 MG capsule Take 2-4 mg by mouth 4 (four) times daily as needed for diarrhea or loose stools.     Marland Kitchen loratadine (CLARITIN) 10 MG tablet Take 10 mg by mouth daily as needed for allergies.    . Multiple Vitamin (MULTIVITAMIN) tablet Take 1 tablet daily by mouth.    . ondansetron (ZOFRAN ODT) 8 MG disintegrating tablet Take 1 tablet (8 mg total) by mouth every 8 (eight) hours as needed for nausea or vomiting. Start on Day 3 post chemo 20 tablet 0  . oxyCODONE (OXY IR/ROXICODONE) 5 MG immediate release tablet Take 1 tablet (5 mg total) by mouth every 6 (six) hours as needed for moderate pain. 20 tablet 0  . prochlorperazine (COMPAZINE) 10 MG tablet Take 1 tablet (10 mg total) every 6 (six) hours as needed by mouth (Nausea or vomiting). 30 tablet 1  .  simvastatin (ZOCOR) 40 MG tablet Take 40 mg at bedtime by mouth.     No current facility-administered medications for this encounter.    BP (!) 151/81   Pulse 77   Wt 123 lb 8 oz (56 kg)   SpO2 100%   BMI 22.59 kg/m  General: NAD Neck: No JVD, no thyromegaly or thyroid nodule.  Lungs: Clear to auscultation bilaterally with normal respiratory effort. CV: Nondisplaced PMI.  Heart regular S1/S2, no S3/S4, no murmur.  No peripheral edema.  No carotid bruit.  Normal pedal pulses.  Abdomen: Soft, nontender, no hepatosplenomegaly, no distention.  Skin: Intact without lesions or rashes.  Neurologic: Alert and oriented x 3.  Psych: Normal affect. Extremities: No clubbing or cyanosis.  HEENT: Normal.   Assessment/Plan: 1. Breast cancer: We discussed the cardiac risk of Herceptin.  I reviewed her pre-treatment echo and today's echo.  Strain is mildly less negative today but I do not think that this is signficant.  LV systolic function remains stable.   - Repeat echo in 3 months with followup appt.  2. HTN: BP mildly elevated today, will need to watch this closely.  3. Hyperlipidemia: Patient has family history of premature CAD.  She is appropriately treated with a statin.   Loralie Champagne 09/22/2017

## 2017-09-23 ENCOUNTER — Ambulatory Visit: Payer: Medicare Other | Admitting: Cardiovascular Disease

## 2017-09-23 DIAGNOSIS — C50411 Malignant neoplasm of upper-outer quadrant of right female breast: Secondary | ICD-10-CM | POA: Diagnosis not present

## 2017-09-23 DIAGNOSIS — Z170 Estrogen receptor positive status [ER+]: Secondary | ICD-10-CM | POA: Insufficient documentation

## 2017-09-23 DIAGNOSIS — C50112 Malignant neoplasm of central portion of left female breast: Secondary | ICD-10-CM | POA: Diagnosis not present

## 2017-09-23 DIAGNOSIS — Z510 Encounter for antineoplastic radiation therapy: Secondary | ICD-10-CM | POA: Insufficient documentation

## 2017-09-24 ENCOUNTER — Ambulatory Visit
Admission: RE | Admit: 2017-09-24 | Discharge: 2017-09-24 | Disposition: A | Discharge status: Home / Self Care | Payer: Medicare Other | Source: Ambulatory Visit | Attending: Radiation Oncology | Admitting: Radiation Oncology

## 2017-09-24 DIAGNOSIS — C50411 Malignant neoplasm of upper-outer quadrant of right female breast: Secondary | ICD-10-CM | POA: Diagnosis not present

## 2017-09-24 DIAGNOSIS — C50112 Malignant neoplasm of central portion of left female breast: Secondary | ICD-10-CM | POA: Diagnosis not present

## 2017-09-24 DIAGNOSIS — Z510 Encounter for antineoplastic radiation therapy: Secondary | ICD-10-CM | POA: Diagnosis not present

## 2017-09-24 DIAGNOSIS — Z170 Estrogen receptor positive status [ER+]: Secondary | ICD-10-CM | POA: Diagnosis not present

## 2017-09-25 ENCOUNTER — Ambulatory Visit
Admission: RE | Admit: 2017-09-25 | Discharge: 2017-09-25 | Disposition: A | Discharge status: Home / Self Care | Payer: Medicare Other | Source: Ambulatory Visit | Attending: Radiation Oncology | Admitting: Radiation Oncology

## 2017-09-25 DIAGNOSIS — C50411 Malignant neoplasm of upper-outer quadrant of right female breast: Secondary | ICD-10-CM | POA: Diagnosis not present

## 2017-09-25 DIAGNOSIS — Z510 Encounter for antineoplastic radiation therapy: Secondary | ICD-10-CM | POA: Diagnosis not present

## 2017-09-25 DIAGNOSIS — C50112 Malignant neoplasm of central portion of left female breast: Secondary | ICD-10-CM | POA: Diagnosis not present

## 2017-09-25 DIAGNOSIS — Z170 Estrogen receptor positive status [ER+]: Secondary | ICD-10-CM | POA: Diagnosis not present

## 2017-09-25 NOTE — Progress Notes (Addendum)
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 [facility]) Staging form: Breast, AJCC 8th Edition - Clinical stage from 05/03/2017: Stage IA (cT1a, cN0, cM0, G1, ER: Positive, PR: Positive, HER2: Negative) - Signed by [name], MD on 05/07/2017 - Pathologic stage from 08/22/2017: No Stage Recommended (ypT0, pN0, cM0, GX, ER: Unknown, PR: Unknown, HER2: Unknown) - Signed by [name], MD on 09/05/2017  Cancer of overlapping sites of right female breast [facility]) Staging form: Breast, AJCC 8th Edition - Clinical stage from 04/24/2017: Stage IB (cT3, cN0, cM0, G2, ER: Positive, PR: Positive, HER2: Positive) - Signed by [name], MD on 05/07/2017 - Pathologic stage from 08/22/2017: No Stage Recommended (ypT3, pN1a, cM0, G2, ER: Positive, PR: Positive, HER2: Negative) - Signed by [name], MD on 09/05/2017       Cancer of overlapping sites of right female breast ([place])   03/08/2017 Mammogram    IMPRESSION: 1. Patient has a is palpable mass in the lateral right breast, in the location dense fibroglandular breast tissue. Although no discrete mass is seen sonographically or mammographically, the Clinical change remains concerning. Biopsy is recommended.  RECOMMENDATION: 1. Ultrasound-guided core needle biopsy of palpable abnormality in the lateral right breast.      04/24/2017 Breast US    IMPRESSION: Ultrasound guided biopsy of the right breast. No apparent complications.      04/24/2017 Initial Biopsy    Breast, right, needle core biopsy, UOQ, centered at 9:30 o'clock - INVASIVE  MAMMARY CARCINOMA. - SEE COMMENT. ER 100% positive PR 100% positive Ki67 10% HER2 positive      05/02/2017 Breast MRI    IMPRESSION: 1. 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 2. 4 mm enhancing mass with associated architectural distortion in the central left breast. This is suspicious for additional focus of carcinoma.      05/07/2017 Initial Diagnosis    Cancer of overlapping sites of right female breast ([place])      05/15/2017 Echocardiogram    ECHO 05/15/17  Impressions: - LVEF 60-65%, normal wall thickness, normal wall motion and GLS   strain, grade 1 DD, normal LV filling pressure, mild MR, normal   LA size, normal IVC.         05/22/2017 Imaging    CT CAP W Contrast 05/22/17 IMPRESSION: 1. No specific CT findings of nodal or other metastatic disease in the chest, abdomen, and pelvis. 2. Bosniak category 65F cyst in the right mid kidney, with mild enhancement along a septation but without nodularity. Unless follow up abdominal scans related to the patient's breast cancer adequately characterize this lesion, renal protocol MRI or CT scan would be recommended in 6 months time. 3. Other imaging findings of potential clinical significance: The Aortic Atherosclerosis (ICD10-I70.0). Ectatic ascending thoracic aorta without aneurysm. Small type 1 hiatal hernia. Several small hypodense liver lesions are likely benign although technically too small to characterize. Nonobstructive 1.2 cm right renal calculus. Anterior uterine fundal myometrial mass favoring fibroid. Pelvic floor laxity with small cystocele. Notable spondylosis at L3-4 and L5-S1.  05/22/2017 Imaging    Bone Scan Whole Body 05/22/17 IMPRESSION: 1. No findings of osseous metastatic disease. 2. Lower lumbar activity corresponds to areas of significant benign spondylosis on the CT scan.      05/24/2017 -  Chemotherapy     TCHP every 3 weeks for 6 cycles starting 05/24/17 followed by a year of maintenance Herceptin and Perjeta. Given lack of response she stopped TCHP after 4 cycles.  She will proceed with Maintenance Herceptin and Perjeta on 08/16/17.         07/12/2017 - 07/14/2017 Hospital Admission    Admit date: 07/12/17 Admission diagnosis: GI Hemorrhaging  Additional comments: Colonoscopy and EGD done with no evidence of bleeding site. Given blood transfusion on 07/15/17      08/02/2017 Imaging    MRI Breast Bilateral 2/16//18 IMPRESSION: 1. The known lobular carcinoma of the right breast has not significantly changed since the prior MRI from 05/02/2017. The disease spans approximately 5.6 cm, previously 5.9 cm in the anterior to posterior dimension. 2. 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 3.  No new suspicious findings in either breast. 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      08/16/2017 -  Anti-estrogen oral therapy    Letrozole once daily starting 08/16/17       08/22/2017 Surgery     RIGHT MASTECTOMY WITH SENTINEL LYMPH NODE BIOPSY ERAS PATHWAY by Dr. Georgette Dover      08/22/2017 Pathology Results    Diagnosis 08/22/17 1. Breast, lumpectomy, Left - ATYPICAL DUCTAL HYPERPLASIA - FIBROCYSTIC CHANGES - PREVIOUS BIOPSY SITE CHANGES - SEE COMMENT 2. Lymph node, sentinel, biopsy, Left Axillary #1 - NO CARCINOMA IDENTIFIED IN ONE LYMPH NODE (0/1) - SEE COMMENT 3. Lymph node, sentinel, biopsy, Left Axillary #2 - NO CARCINOMA IDENTIFIED IN ONE LYMPH NODE (0/1) - SEE COMMENT 4. Lymph node, sentinel, biopsy, Left Axillary #3 - NO CARCINOMA IDENTIFIED IN ONE LYMPH NODE (0/1) - SEE COMMENT 5. Lymph node, sentinel, biopsy, Left - NO CARCINOMA IDENTIFIED IN ONE LYMPH NODE (0/1) - SEE  COMMENT 6. 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 Lymph node, sentinel, biopsy, Right Axillary #1 - NO CARCINOMA IDENTIFIED IN ONE LYMPH NODE (0/1) - SEE COMMENT 8. Lymph node, sentinel, biopsy, Right - NO CARCINOMA IDENTIFIED IN ONE LYMPH NODE (0/1) - SEE COMMENT 9. Lymph node, sentinel, biopsy, Right - NO CARCINOMA IDENTIFIED IN ONE LYMPH NODE (0/1) 1 of 5 FINAL for [place], [place] ([id number]) Diagnosis(continued) - SEE COMMENT 10. Lymph node, sentinel, biopsy, Right Axillary #2 - METASTATIC CARCINOMA PRESENT IN ONE LYMPH NODE (1/1) - SEE COMMENT 11. Lymph node, sentinel, biopsy, Right Axillary #3 - NO CARCINOMA IDENTIFIED IN ONE LYMPH NODE (0/1) - SEE COMMENT 12. 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 2. 4 mm enhancing mass with associated architectural distortion in the central left breast. This is  suspicious for additional focus of carcinoma.      05/03/2017 Mammogram    IMPRESSION: 1. Suspicious area of architectural distortion in the left breast which corresponds to a small enhancing mass on MRI.  RECOMMENDATION: Stereotactic core needle biopsy of the area of architectural distortion in the left breast.       05/03/2017 Breast US    Stereotactic core needle biopsy of the area of architectural distortion in the left breast.      05/03/2017 Initial Biopsy    Breast, left, needle core biopsy, central, slightly toward the lower, outer quadrant - INVASIVE DUCTAL CARCINOMA, MSBR GRADE 1/2. - SEE MICROSCOPIC  DESCRIPTION. ER 50% positive PR 90% positive Ki67: 1%      05/07/2017 Initial Diagnosis    Cancer of central portion of left breast ([place])      08/22/2017 Surgery    LEFT BREAST LUMPECTOMY WITH RADIOACTIVE SEED AND SENTINEL LYMPH NODE BIOPSY ERAS PATHWAY by Dr. Georgette Dover 08/22/17       08/22/2017 Pathology Results    Diagnosis 08/22/17 1. Breast, lumpectomy, Left - ATYPICAL DUCTAL HYPERPLASIA - FIBROCYSTIC CHANGES - PREVIOUS BIOPSY SITE CHANGES - SEE COMMENT 2. Lymph node, sentinel, biopsy, Left Axillary #1 - NO CARCINOMA IDENTIFIED IN ONE LYMPH NODE (0/1) - SEE COMMENT 3. Lymph node, sentinel, biopsy, Left Axillary #2 - NO CARCINOMA IDENTIFIED IN ONE LYMPH NODE (0/1) - SEE COMMENT 4. Lymph node, sentinel, biopsy, Left Axillary #3 - NO CARCINOMA IDENTIFIED IN ONE LYMPH NODE (0/1) - SEE COMMENT 5. Lymph node, sentinel, biopsy, Left - NO CARCINOMA IDENTIFIED IN ONE LYMPH NODE (0/1) - SEE COMMENT 6. 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 Lymph node, sentinel, biopsy, Right Axillary #1 - NO CARCINOMA IDENTIFIED IN ONE LYMPH NODE (0/1) - SEE COMMENT 8. Lymph node, sentinel, biopsy, Right - NO CARCINOMA IDENTIFIED IN ONE LYMPH NODE (0/1) - SEE COMMENT 9. Lymph node, sentinel, biopsy, Right - NO CARCINOMA IDENTIFIED IN ONE LYMPH NODE (0/1) 1 of 5 FINAL for [place], [place] ([id number]) Diagnosis(continued) - SEE COMMENT 10. Lymph node, sentinel, biopsy, Right Axillary #2 - METASTATIC CARCINOMA PRESENT IN ONE LYMPH NODE (1/1) - SEE COMMENT 11. Lymph node, sentinel, biopsy, Right Axillary #3 - NO CARCINOMA IDENTIFIED IN ONE LYMPH NODE (0/1) - SEE COMMENT 12. Lymph node, sentinel, biopsy, Right - NO CARCINOMA IDENTIFIED IN ONE LYMPH NODE (0/1) - SEE COMMENT     CURRENT THERAPY:  1. Letrozole once  daily starting 08/16/17  2. Herceptin and Perjeta every 3 weeks for maintenance therapy starting 08/16/17 - 09/05/17 due to HER2 negative by IHC   INTERVAL HISTORY: Ms. Poland returns for follow-up as scheduled.  She presents with her mentor who is a breast cancer survivor. She began adjuvant radiation on 09/25/2017, tolerating well so far.  Range of motion is improving, no lymphedema.  Continues to take letrozole daily, hot flashes are mild and tolerable.  Denies new bone or joint pain.  Normal appetite.  Denies fatigue.  Has occasional tingling to her feet at night.  Hair is starting to grow back. She saw Dr. Aundra Dubin and had echo last week.  Her husband had a major orthopedic surgery recently but is recovering well.  REVIEW OF SYSTEMS:   Constitutional: Denies fatigue, fevers, chills or abnormal weight loss Eyes: Denies blurriness of vision Ears, nose, mouth, throat, and face: Denies mucositis  or sore throat Respiratory: Denies cough, dyspnea or wheezes Cardiovascular: Denies palpitation, chest discomfort or lower extremity swelling Gastrointestinal:  Denies nausea, vomiting, constipation, diarrhea, heartburn or change in bowel habits Skin: Denies abnormal skin rashes Lymphatics: Denies new lymphadenopathy or easy bruising Neurological:Denies numbness or new weaknesses (+) intermittent mild tingling to feet at night  Behavioral/Psych: Mood is stable, no new changes (+) stress, husband had recent operation  MSK: (+) UE ROM improving  Breasts: Denies pain or changes  All other systems were reviewed with the patient and are negative.  MEDICAL HISTORY:  Past Medical History:  Diagnosis Date  . Anemia    with chemo  . Cancer ([place]) 04/2017   Bil Breast Ca  . Hyperlipidemia   . Hypertension   . PONV (postoperative nausea and vomiting)    nausea only    SURGICAL HISTORY: Past Surgical History:  Procedure Laterality Date  . BREAST LUMPECTOMY WITH RADIOACTIVE SEED AND SENTINEL LYMPH NODE  BIOPSY Left 08/22/2017   Procedure: LEFT BREAST LUMPECTOMY WITH RADIOACTIVE SEED AND SENTINEL LYMPH NODE BIOPSY ERAS PATHWAY;  Surgeon: [name], MD;  Location: [facility];  Service: General;  Laterality: Left;  PECTORAL BLOCK  . COLONOSCOPY WITH PROPOFOL Left 07/14/2017   Procedure: COLONOSCOPY WITH PROPOFOL;  Surgeon: Ronnette Juniper, MD;  Location: [facility];  Service: Gastroenterology;  Laterality: Left;  . DILATION AND CURETTAGE OF UTERUS    . ESOPHAGOGASTRODUODENOSCOPY N/A 07/13/2017   Procedure: ESOPHAGOGASTRODUODENOSCOPY (EGD);  Surgeon: Ronnette Juniper, MD;  Location: [facility];  Service: Gastroenterology;  Laterality: N/A;  . MASTECTOMY W/ SENTINEL NODE BIOPSY Right 08/22/2017   Procedure: RIGHT MASTECTOMY WITH SENTINEL LYMPH NODE BIOPSY ERAS PATHWAY;  Surgeon: [name], MD;  Location: [facility] [facility];  Service: General;  Laterality: Right;  PECTORAL BLOCK  . PORTACATH PLACEMENT Right 05/14/2017   Procedure: ULTRASOUND GUIDED PORT PLACEMENT;  Surgeon: [name], MD;  Location: [facility];  Service: General;  Laterality: Right;    I have reviewed the social history and family history with the patient and they are unchanged from previous note.  ALLERGIES:  has No Known Allergies.  MEDICATIONS:  Current Outpatient Medications  Medication Sig Dispense Refill  . irbesartan (AVAPRO) 150 MG tablet Take 150 mg by mouth daily.     Marland Kitchen letrozole (FEMARA) 2.5 MG tablet Take 1 tablet (2.5 mg total) by mouth daily. 30 tablet 6  . Multiple Vitamin (MULTIVITAMIN) tablet Take 1 tablet daily by mouth.    . simvastatin (ZOCOR) 40 MG tablet Take 40 mg at bedtime by mouth.    . lidocaine-prilocaine (EMLA) cream Apply to affected area once (Patient taking differently: Apply 1 application topically daily as needed (prior to port being accessed. (every 3 weeks)). Apply to affected area once) 30 g 3  . loperamide (IMODIUM) 2 MG capsule Take 2-4 mg by mouth 4 (four) times daily as needed for  diarrhea or loose stools.     Marland Kitchen loratadine (CLARITIN) 10 MG tablet Take 10 mg by mouth daily as needed for allergies.    Marland Kitchen ondansetron (ZOFRAN ODT) 8 MG disintegrating tablet Take 1 tablet (8 mg total) by mouth every 8 (eight) hours as needed for nausea or vomiting. Start on Day 3 post chemo 20 tablet 0  . oxyCODONE (OXY IR/ROXICODONE) 5 MG immediate release tablet Take 1 tablet (5 mg total) by mouth every 6 (six) hours as needed for moderate pain. 20 tablet 0  . prochlorperazine (COMPAZINE) 10 MG tablet Take 1 tablet (10 mg total)  every 6 (six) hours as needed by mouth (Nausea or vomiting). 30 tablet 1   No current facility-administered medications for this visit.     PHYSICAL EXAMINATION: ECOG PERFORMANCE STATUS: 1 - Symptomatic but completely ambulatory  Vitals:   09/26/17 1118  BP: (!) 154/90  Pulse: 77  Resp: 18  Temp: 97.7 F (36.5 C)  SpO2: 99%   Filed Weights   09/26/17 1118  Weight: 122 lb 9.6 oz (55.6 kg)    GENERAL:alert, no distress and comfortable SKIN: skin color, texture, turgor are normal, no rashes or significant lesions EYES: normal, Conjunctiva are pink and non-injected, sclera clear OROPHARYNX:no exudate, no erythema and lips, buccal mucosa, and tongue normal  LYMPH:  no palpable cervical, supraclavicular, or axillary lymphadenopathy  LUNGS: clear to auscultation with normal breathing effort HEART: regular rate & rhythm and no murmurs and no lower extremity edema ABDOMEN:abdomen soft, non-tender and normal bowel sounds Musculoskeletal:no cyanosis of digits and no clubbing  NEURO: alert & oriented x 3 with fluent speech, no focal motor/sensory deficits BREASTS: (+) s/p right mastectomy, incision is healing well without erythema or drainage. (+) s/p left lumpectomy, axillary and areolar incisions are well healed (+) areolar firmness at incision site  PAC without erythema   LABORATORY DATA:  I have reviewed the data as listed CBC Latest Ref Rng & Units  09/26/2017 09/05/2017 08/23/2017  WBC 3.9 - 10.3 K/uL 3.3(L) 3.8(L) 5.6  Hemoglobin 11.6 - 15.9 g/dL 9.6(L) 8.0(L) 8.9(L)  Hematocrit 34.8 - 46.6 % 30.6(L) 24.5(L) 27.5(L)  Platelets 145 - 400 K/uL 264 225 273     CMP Latest Ref Rng & Units 09/26/2017 09/05/2017 08/23/2017  Glucose 70 - 140 mg/dL 99 134 122(H)  BUN 7 - 26 mg/dL _0 Creatinine 0.60 - 1.10 mg/dL 0.78 0.79 0.87  Sodium 136 - 145 mmol/L 143 143 139  Potassium 3.5 - 5.1 mmol/L 4.2 4.2 4.1  Chloride 98 - 109 mmol/L 109 110(H) 107  CO2 22 - 29 mmol/L _1 Calcium 8.4 - 10.4 mg/dL 9.4 8.7 8.3(L)  Total Protein 6.4 - 8.3 g/dL 6.4 5.9(L) -  Total Bilirubin 0.2 - 1.2 mg/dL 0.3 0.2 -  Alkaline Phos 40 - 150 U/L 98 97 -  AST 5 - 34 U/L 27 30 -  ALT 0 - 55 U/L 27 39 -   CA 27.29  05/13/17: 59.5 05/31/17: 50.0 07/04/17: 44.2 08/02/17: 49.6 09/05/17: 31.7 09/26/17: PENDING   Diagnosis 08/22/17 1. Breast, lumpectomy, Left - ATYPICAL DUCTAL HYPERPLASIA - FIBROCYSTIC CHANGES - PREVIOUS BIOPSY SITE CHANGES - SEE COMMENT 2. Lymph node, sentinel, biopsy, Left Axillary #1 - NO CARCINOMA IDENTIFIED IN ONE LYMPH NODE (0/1) - SEE COMMENT 3. Lymph node, sentinel, biopsy, Left Axillary #2 - NO CARCINOMA IDENTIFIED IN ONE LYMPH NODE (0/1) - SEE COMMENT 4. Lymph node, sentinel, biopsy, Left Axillary #3 - NO CARCINOMA IDENTIFIED IN ONE LYMPH NODE (0/1) - SEE COMMENT 5. Lymph node, sentinel, biopsy, Left - NO CARCINOMA IDENTIFIED IN ONE LYMPH NODE (0/1) - SEE COMMENT 6. 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 Lymph node, sentinel, biopsy, Right Axillary #1 - NO CARCINOMA IDENTIFIED IN ONE LYMPH NODE (0/1) - SEE COMMENT 8. Lymph node, sentinel, biopsy, Right - NO CARCINOMA IDENTIFIED IN ONE LYMPH NODE (0/1) - SEE COMMENT 9. Lymph node, sentinel, biopsy,  Right -  NO CARCINOMA IDENTIFIED IN ONE LYMPH NODE (0/1) 1 of 5 FINAL for [place], [place] ([id number]) Diagnosis(continued) - SEE COMMENT 10. Lymph node, sentinel, biopsy, Right Axillary #2 - METASTATIC CARCINOMA PRESENT IN ONE LYMPH NODE (1/1) - SEE COMMENT 11. Lymph node, sentinel, biopsy, Right Axillary #3 - NO CARCINOMA IDENTIFIED IN ONE LYMPH NODE (0/1) - SEE COMMENT 12. Lymph node, sentinel, biopsy, Right - NO CARCINOMA IDENTIFIED IN ONE LYMPH NODE (0/1) - SEE COMMENT Microscopic Comment 1. An oncology table was not completed on this specimen because here was no residual carcinoma identified. 2. -5 and 7-12. Cytokeratin AE1/3 was utilized to exclude micrometastasis. Only one lymph node has isolated tumor cells (see part 10); all other lymph nodes were negative. 6. BREAST, STATUS POST NEOADJUVANT TREATMENT Procedure: Mastectomy Laterality: Right Tumor Size: 6 x 3.5x 2.5 cm (see comment) Histologic Type: Invasive lobular carcinoma Grade: Nottingham Grade 2 Tubular Differentiation: 3 Nuclear Pleomorphism: 3 Mitotic Count: 1 Ductal Carcinoma in Situ (DCIS): Not identified Regional Lymph Nodes: Number of Lymph Nodes Examined:10 Number of Sentinel Lymph Nodes Examined: 10 Lymph Nodes with Macrometastases: 0 Lymph Nodes with Micrometastases: 0 Lymph Nodes with Isolated Tumor Cells: 1 Margins: Uninvolved by carcinoma Invasive carcinoma, distance from closest margin: 0.9 (anterior soft tissue margin) DCIS, distance from closest margin: N/A Breast Prognostic Profile (pre-neoadjuvant case #: [id number]) Estrogen Receptor: Positive (100%, strong) Progesterone Receptor: Positive (100%, strong) Her2: Positive (Ratio: 2.10) Ki-67: 10% Residual Cancer Burden (RCB): Primary Tumor Bed: 60 mm x 40m Overall Cancer Cellularity: 30% Percentage of Cancer that is in Situ: N/A Number of Positive Lymph Nodes: 1 Diameter of Largest Lymph Node metastasis: 1 mm Residual Cancer Burden :  3.186 Residual Cancer Burden Class: RCB-II Pathologic Stage Classification (p TNM, AJCC 8th Edition): Primary Tumor: ypT3 Regional Lymph Nodes: ypN0(i+) COMMENT: There is residual tumor present in all of the submitted blocks from the upper and lower outer quadrant of the breast and therefore the measurement of the tumor is based on the gross measurement.  6. By immunohistochemistry, the tumor cells are negative for Her2 (0).   RADIOGRAPHIC STUDIES: I have personally reviewed the radiological images as listed and agreed with the findings in the report. No results found.   ASSESSMENT & PLAN: 72y.o. caucasian postmenopausal woman   1.  Cancer of overlapping sites of right breast, invasive lobular carcinoma, Stage IB cT3, cN0, cM0, G2; ER positive, PR positive, HER2 negative by IHC, ypT3N1a 2.  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 Genetics 4. Bosniak category 14F cyst in the right mid kidney, 1.2cm Non-obstructive kidney stone 5. Borderline DM,  secondary to steroids, yeast infection symptoms 6. Anemia, secondary to chemotherapy, and history of GI bleeding in 06/2017 7. Bone health  Ms. Lapine appears stable. She completed 2 cycles maintenance herceptin/perjeta on 09/05/17. She tolerated well. She has mild intermittent tingling to her feet, not likely related to anti-HER2 therapy but possibly related to previous chemotherapy. I recommend she try B complex vitamin; will monitor. Labs reviewed, CBC improving, CMP unremarkable. CA 27.29 pending. BP is elevated, she reports being under stress lately.   Her right side tumor surgical and initial biopsy were retested for HER2. The IHC on initial biopsy was 1+ and surgical tissue was 0; based on these results her right side tumor is HER2 negative. Willl discontinue further herceptin/perjeta therapy. She sees Dr. TGeorgette Doverlater this  month, will  request he remove PAC. 09/20/17 echo is stable, EF 55%. She began adjuvant radiation therapy, tolerating well so far. Continues daily letrozole, tolerating well with mild stable hot flash.   The patient was seen by Dr. Burr Medico who discussed she is a candidate for the NATALEE phase III trial given her ypT3N1a HR+/HER2- disease, comparing adjuvant endocrine therapy +/- CKD 4/6 inhibitor ribociclib. She is interested and will consider. The study plans to open this month. Referred to research today. Will revisit after she completes adjuvant RT; Return for lab and f/u in 6 weeks when she completes radiation.   PLAN:  -Discontinue herceptin/perjeta, patient's tumor is HER2 negative by IHC -Continue adjuvant RT and letrozole -Given printed material on ribociclib, considering NATALEE study  -Message to Dr. Georgette Dover request to remove PAC -f/u in 6 weeks  -B complex vitamin  -refilled Femara  All questions were answered. The patient knows to call the clinic with any problems, questions or concerns. No barriers to learning was detected. I spent20 minutes counseling the patient face to face. The total time spent in the appointment was 30 minutes and more than 50% was on counseling, review of test results, and coordination of care.      [name], NP 09/26/17   Addendum  I have seen the patient, examined her. I agree with the assessment and and plan and have edited the notes.   Mrs Constantin has started radiation, tolerating well.  I reviewed her initial biopsy and surgical past HER-2 IHC which was 1+ and 0, both were HER2 negative based on the IHC.  She clinically did not respond to neoadjuvant therapy and dual anti-HER-2 therapy.  I will stop Herceptin and pejeta maintenance therapy.  She will continue adjuvant letrozole.  I discussed the clinical trial NATAMEE with her, which is a phase 3 trial of adjuvant CDK 4/6 inhibitor ribiciclib with aromatase inhibitor in HR+/HER2-early stage breast  cancer.  She is interested, our research nurse will screening her and reach out to her.  [name]  09/27/2017

## 2017-09-26 ENCOUNTER — Ambulatory Visit
Admission: RE | Admit: 2017-09-26 | Discharge: 2017-09-26 | Disposition: A | Discharge status: Home / Self Care | Payer: Medicare Other | Source: Ambulatory Visit | Attending: Radiation Oncology | Admitting: Radiation Oncology

## 2017-09-26 ENCOUNTER — Inpatient Hospital Stay: Payer: Medicare Other

## 2017-09-26 ENCOUNTER — Inpatient Hospital Stay: Payer: Medicare Other | Attending: Hematology

## 2017-09-26 ENCOUNTER — Telehealth: Payer: Self-pay | Admitting: Nurse Practitioner

## 2017-09-26 ENCOUNTER — Inpatient Hospital Stay (HOSPITAL_BASED_OUTPATIENT_CLINIC_OR_DEPARTMENT_OTHER): Payer: Medicare Other | Admitting: Nurse Practitioner

## 2017-09-26 ENCOUNTER — Encounter: Payer: Self-pay | Admitting: Nurse Practitioner

## 2017-09-26 VITALS — BP 154/90 | HR 77 | Temp 97.7°F | Resp 18 | Ht 62.0 in | Wt 122.6 lb

## 2017-09-26 DIAGNOSIS — Z170 Estrogen receptor positive status [ER+]: Secondary | ICD-10-CM

## 2017-09-26 DIAGNOSIS — C50112 Malignant neoplasm of central portion of left female breast: Secondary | ICD-10-CM

## 2017-09-26 DIAGNOSIS — I10 Essential (primary) hypertension: Secondary | ICD-10-CM | POA: Diagnosis not present

## 2017-09-26 DIAGNOSIS — C50811 Malignant neoplasm of overlapping sites of right female breast: Secondary | ICD-10-CM

## 2017-09-26 DIAGNOSIS — D6481 Anemia due to antineoplastic chemotherapy: Secondary | ICD-10-CM | POA: Diagnosis not present

## 2017-09-26 DIAGNOSIS — C50411 Malignant neoplasm of upper-outer quadrant of right female breast: Secondary | ICD-10-CM | POA: Diagnosis not present

## 2017-09-26 DIAGNOSIS — Z510 Encounter for antineoplastic radiation therapy: Secondary | ICD-10-CM | POA: Diagnosis not present

## 2017-09-26 LAB — CBC WITH DIFFERENTIAL/PLATELET
Basophils Absolute: 0.1 10*3/uL (ref 0.0–0.1)
Basophils Relative: 2 %
Eosinophils Absolute: 0.2 10*3/uL (ref 0.0–0.5)
Eosinophils Relative: 7 %
HCT: 30.6 % — ABNORMAL LOW (ref 34.8–46.6)
Hemoglobin: 9.6 g/dL — ABNORMAL LOW (ref 11.6–15.9)
Lymphocytes Relative: 29 %
Lymphs Abs: 1 10*3/uL (ref 0.9–3.3)
MCH: 30.3 pg (ref 25.1–34.0)
MCHC: 31.4 g/dL — ABNORMAL LOW (ref 31.5–36.0)
MCV: 96.5 fL (ref 79.5–101.0)
Monocytes Absolute: 0.5 10*3/uL (ref 0.1–0.9)
Monocytes Relative: 15 %
Neutro Abs: 1.6 10*3/uL (ref 1.5–6.5)
Neutrophils Relative %: 47 %
Platelets: 264 10*3/uL (ref 145–400)
RBC: 3.17 MIL/uL — ABNORMAL LOW (ref 3.70–5.45)
RDW: 14.4 % (ref 11.2–14.5)
WBC: 3.3 10*3/uL — ABNORMAL LOW (ref 3.9–10.3)

## 2017-09-26 LAB — COMPREHENSIVE METABOLIC PANEL
ALT: 27 U/L (ref 0–55)
AST: 27 U/L (ref 5–34)
Albumin: 3.9 g/dL (ref 3.5–5.0)
Alkaline Phosphatase: 98 U/L (ref 40–150)
Anion gap: 7 (ref 3–11)
BUN: 19 mg/dL (ref 7–26)
CO2: 27 mmol/L (ref 22–29)
Calcium: 9.4 mg/dL (ref 8.4–10.4)
Chloride: 109 mmol/L (ref 98–109)
Creatinine, Ser: 0.78 mg/dL (ref 0.60–1.10)
GFR calc Af Amer: >60 mL/min (ref >60)
GFR calc non Af Amer: >60 mL/min (ref >60)
Glucose, Bld: 99 mg/dL (ref 70–140)
Potassium: 4.2 mmol/L (ref 3.5–5.1)
Sodium: 143 mmol/L (ref 136–145)
Total Bilirubin: 0.3 mg/dL (ref 0.2–1.2)
Total Protein: 6.4 g/dL (ref 6.4–8.3)

## 2017-09-26 MED ORDER — SODIUM CHLORIDE 0.9% FLUSH
10.0000 mL | Freq: Once | INTRAVENOUS | Status: AC
  Administered 2017-09-26: 10 mL
  Filled 2017-09-26: qty 10

## 2017-09-26 MED ORDER — LETROZOLE 2.5 MG PO TABS: 2.5000 mg | ORAL_TABLET | Freq: Every day | ORAL | 6 refills | Status: DC

## 2017-09-26 MED ORDER — HEPARIN SOD (PORK) LOCK FLUSH 100 UNIT/ML IV SOLN
500.0000 [IU] | Freq: Once | INTRAVENOUS | Status: AC
  Administered 2017-09-26: 500 [IU]
  Filled 2017-09-26: qty 5

## 2017-09-26 NOTE — Patient Instructions (Signed)
Ribociclib tablets What is this medicine? RIBOCICLIB (rye boe SYE klib) is a medicine that targets proteins in cancer cells and stops the cancer cells from growing. It is used to treat breast cancer. This medicine may be used for other purposes; ask your health care provider or pharmacist if you have questions. COMMON BRAND NAME(S): KISQALI What should I tell my health care provider before I take this medicine? They need to know if you have any of these conditions: -heart disease -history of irregular heartbeat -infection (especially a virus infection such as chickenpox, cold sores, or herpes) -liver disease -low blood counts, like low white cell, platelet, or red cell counts -low levels of calcium, magnesium, potassium, or phosphorus in the blood -an unusual or allergic reaction to ribociclib, other medicines, foods, dyes, or preservatives -pregnant or trying to get pregnant -breast-feeding How should I use this medicine? Take this medicine by mouth with a glass of water. Follow the directions on the prescription label. Do not cut, crush or chew this medicine. Do not take with grapefruit juice, pomegranates, or pomegranate juice. You can take it with or without food. If it upsets your stomach, take it with food. Take your medicine at regular intervals. Do not take it more often than directed. Do not stop taking except on your doctor's advice. Talk to your pediatrician regarding the use of this medicine in children. Special care may be needed. Overdosage: If you think you have taken too much of this medicine contact a poison control center or emergency room at once. NOTE: This medicine is only for you. Do not share this medicine with others. What if I miss a dose? If you miss a dose or vomit after taking a dose, do not take another dose on that day. Take your next dose at your regular time. What may interact with this medicine? Do not take this medicine with any of the following  medications: -cisapride -dofetilide -dronedarone -pimozide -thioridazine -ziprasidone This medicine may interact with the following medications: -alfentanil -antiviral medicines for HIV or AIDS -boceprevir -certain medicines for fungal infections like ketoconazole, itraconazole, posaconazole, and voriconazole -certain medicines for seizures like carbamazepine, phenobarbital, phenytoin -clarithromycin -conivaptan -cyclosporine -ergot alkaloids like dihydroergotamine, ergonovine, ergotamine, methylergonovine -everolimus -fentanyl -grapefruit juice -midazolam -nefazodone -other medicines that prolong the QT interval (cause an abnormal heart rhythm) -pomegranate juice -quinidine -rifampin -sirolimus -St. John's Wort -tacrolimus This list may not describe all possible interactions. Give your health care provider a list of all the medicines, herbs, non-prescription drugs, or dietary supplements you use. Also tell them if you smoke, drink alcohol, or use illegal drugs. Some items may interact with your medicine. What should I watch for while using this medicine? Tell your doctor or healthcare professional if your symptoms do not start to get better or if they get worse. Do not take this medicine close to bedtime. It may prevent you from sleeping. You may need blood work done while you are taking this medicine. Do not become pregnant while taking this medicine or for 3 weeks after the last dose. Women should inform their doctor if they wish to become pregnant or think they might be pregnant. There is a potential for serious side effects to an unborn child. Men should inform their doctors if they wish to father a child. This medicine may lower sperm counts. Talk to your health care professional or pharmacist for more information. Do not breast-feed an infant while taking this medicine or for 3 weeks after the last dose. Avoid   taking products that contain aspirin, acetaminophen, ibuprofen,  naproxen, or ketoprofen unless instructed by your doctor. These medicines may hide a fever. Be careful brushing and flossing your teeth or using a toothpick because you may get an infection or bleed more easily. If you have any dental work done, tell your dentist you are receiving this medicine. Call your doctor or health care professional for advice if you get a fever, chills or sore throat, or other symptoms of a cold or flu. Do not treat yourself. This drug decreases your body's ability to fight infections. Try to avoid being around people who are sick. This medicine may increase your risk to bruise or bleed. Call your doctor or health care professional if you notice any unusual bleeding. What side effects may I notice from receiving this medicine? Side effects that you should report to your doctor or health care professional as soon as possible: -allergic reactions like skin rash, itching or hives, swelling of the face, lips, or tongue -signs of decreased platelets or bleeding - bruising, pinpoint red spots on the skin, black, tarry stools, blood in the urine -signs of decreased red blood cells - unusually weak or tired, feeling faint or lightheaded, falls -signs of infection - fever or chills, cough, sore throat, pain or difficulty passing urine -signs and symptoms of a dangerous change in heartbeat or heart rhythm like chest pain; dizziness; fast or irregular heartbeat; palpitations; feeling faint or lightheaded, falls; breathing problems -signs and symptoms of liver injury like dark yellow or brown urine; general ill feeling or flu-like symptoms; light-colored stools; loss of appetite; nausea; right upper belly pain; unusually weak or tired; yellowing of the eyes or skin -signs of low calcium like fast heartbeat, muscle cramps or muscle pain; pain, tingling, numbness in the hands or feet; seizures -signs and symptoms of low magnesium like muscle cramps, pain, or weakness; tremors; seizures; or  fast, irregular heartbeat -signs and symptoms of low potassium like muscle cramps or muscle pain; chest pain; dizziness; feeling faint or lightheaded, falls; palpitations; breathing problems; or fast, irregular heartbeat Side effects that usually do not require medical attention (report these to your doctor or health care professional if they continue or are bothersome): -constipation -diarrhea -hair loss -headache -loss of appetite -mouth sores -nausea/vomiting -red spots on the skin -sore throat -stomach pain -swelling of the ankles, feet, hands -trouble sleeping This list may not describe all possible side effects. Call your doctor for medical advice about side effects. You may report side effects to FDA at 1-800-FDA-1088. Where should I keep my medicine? Keep out of the reach of children. Store between 20 and 25 degrees C (68 and 77 degrees F). Throw away any unused medicine after the expiration date. NOTE: This sheet is a summary. It may not cover all possible information. If you have questions about this medicine, talk to your doctor, pharmacist, or health care provider.  2018 [employer] (2015-09-07 14:58:05)  

## 2017-09-26 NOTE — Telephone Encounter (Signed)
Scheduled apt per 4/4 los -Gave patient AVS and calender per los.

## 2017-09-27 ENCOUNTER — Ambulatory Visit: Payer: Self-pay | Admitting: Surgery

## 2017-09-27 ENCOUNTER — Ambulatory Visit
Admission: RE | Admit: 2017-09-27 | Discharge: 2017-09-27 | Disposition: A | Discharge status: Home / Self Care | Payer: Medicare Other | Source: Ambulatory Visit | Attending: Radiation Oncology | Admitting: Radiation Oncology

## 2017-09-27 DIAGNOSIS — Z510 Encounter for antineoplastic radiation therapy: Secondary | ICD-10-CM | POA: Diagnosis not present

## 2017-09-27 DIAGNOSIS — C50112 Malignant neoplasm of central portion of left female breast: Secondary | ICD-10-CM | POA: Diagnosis not present

## 2017-09-27 DIAGNOSIS — Z170 Estrogen receptor positive status [ER+]: Secondary | ICD-10-CM | POA: Diagnosis not present

## 2017-09-27 DIAGNOSIS — C50411 Malignant neoplasm of upper-outer quadrant of right female breast: Secondary | ICD-10-CM | POA: Diagnosis not present

## 2017-09-27 LAB — CANCER ANTIGEN 27.29: CA 27.29: 30.7 U/mL (ref 0.0–38.6)

## 2017-09-30 ENCOUNTER — Ambulatory Visit
Admission: RE | Admit: 2017-09-30 | Discharge: 2017-09-30 | Disposition: A | Discharge status: Home / Self Care | Payer: Medicare Other | Source: Ambulatory Visit | Attending: Radiation Oncology | Admitting: Radiation Oncology

## 2017-09-30 ENCOUNTER — Other Ambulatory Visit: Payer: Self-pay | Admitting: Hematology

## 2017-09-30 DIAGNOSIS — C50112 Malignant neoplasm of central portion of left female breast: Secondary | ICD-10-CM | POA: Diagnosis not present

## 2017-09-30 DIAGNOSIS — Z510 Encounter for antineoplastic radiation therapy: Secondary | ICD-10-CM | POA: Diagnosis not present

## 2017-09-30 DIAGNOSIS — C50411 Malignant neoplasm of upper-outer quadrant of right female breast: Secondary | ICD-10-CM | POA: Diagnosis not present

## 2017-09-30 DIAGNOSIS — Z170 Estrogen receptor positive status [ER+]: Secondary | ICD-10-CM

## 2017-09-30 MED ORDER — RADIAPLEXRX EX GEL
Freq: Once | CUTANEOUS | Status: AC
  Administered 2017-09-30: 17:00:00 via TOPICAL

## 2017-09-30 MED ORDER — ALRA NON-METALLIC DEODORANT (RAD-ONC)
1.0000 "application " | Freq: Once | TOPICAL | Status: AC
  Administered 2017-09-30: 1 via TOPICAL

## 2017-09-30 NOTE — Progress Notes (Signed)

## 2017-10-01 ENCOUNTER — Ambulatory Visit
Admission: RE | Admit: 2017-10-01 | Discharge: 2017-10-01 | Disposition: A | Discharge status: Home / Self Care | Payer: Medicare Other | Source: Ambulatory Visit | Attending: Radiation Oncology | Admitting: Radiation Oncology

## 2017-10-01 DIAGNOSIS — C50112 Malignant neoplasm of central portion of left female breast: Secondary | ICD-10-CM | POA: Diagnosis not present

## 2017-10-01 DIAGNOSIS — Z510 Encounter for antineoplastic radiation therapy: Secondary | ICD-10-CM | POA: Diagnosis not present

## 2017-10-01 DIAGNOSIS — C50411 Malignant neoplasm of upper-outer quadrant of right female breast: Secondary | ICD-10-CM | POA: Diagnosis not present

## 2017-10-01 DIAGNOSIS — Z170 Estrogen receptor positive status [ER+]: Secondary | ICD-10-CM | POA: Diagnosis not present

## 2017-10-02 ENCOUNTER — Other Ambulatory Visit: Payer: Self-pay | Admitting: Nurse Practitioner

## 2017-10-02 ENCOUNTER — Ambulatory Visit
Admission: RE | Admit: 2017-10-02 | Discharge: 2017-10-02 | Disposition: A | Discharge status: Home / Self Care | Payer: Medicare Other | Source: Ambulatory Visit | Attending: Radiation Oncology | Admitting: Radiation Oncology

## 2017-10-02 DIAGNOSIS — C50112 Malignant neoplasm of central portion of left female breast: Secondary | ICD-10-CM | POA: Diagnosis not present

## 2017-10-02 DIAGNOSIS — Z510 Encounter for antineoplastic radiation therapy: Secondary | ICD-10-CM | POA: Diagnosis not present

## 2017-10-02 DIAGNOSIS — Z170 Estrogen receptor positive status [ER+]: Secondary | ICD-10-CM

## 2017-10-02 DIAGNOSIS — C50811 Malignant neoplasm of overlapping sites of right female breast: Secondary | ICD-10-CM

## 2017-10-02 DIAGNOSIS — C50411 Malignant neoplasm of upper-outer quadrant of right female breast: Secondary | ICD-10-CM | POA: Diagnosis not present

## 2017-10-02 MED ORDER — LETROZOLE 2.5 MG PO TABS: 2.5000 mg | ORAL_TABLET | Freq: Every day | ORAL | 3 refills | Status: DC

## 2017-10-02 NOTE — Progress Notes (Signed)
Attempted preop phone call for surgery scheduled for 10/09/17 pt states she is not supposed to have her port out until after 11/05/17. Jessica at Dr. Vonna Kotyk office made aware, will contact patient to clarify and rescheduled if needed.

## 2017-10-03 ENCOUNTER — Ambulatory Visit
Admission: RE | Admit: 2017-10-03 | Discharge: 2017-10-03 | Disposition: A | Discharge status: Home / Self Care | Payer: Medicare Other | Source: Ambulatory Visit | Attending: Radiation Oncology | Admitting: Radiation Oncology

## 2017-10-03 DIAGNOSIS — Z170 Estrogen receptor positive status [ER+]: Secondary | ICD-10-CM | POA: Diagnosis not present

## 2017-10-03 DIAGNOSIS — Z510 Encounter for antineoplastic radiation therapy: Secondary | ICD-10-CM | POA: Diagnosis not present

## 2017-10-03 DIAGNOSIS — C50411 Malignant neoplasm of upper-outer quadrant of right female breast: Secondary | ICD-10-CM | POA: Diagnosis not present

## 2017-10-03 DIAGNOSIS — C50112 Malignant neoplasm of central portion of left female breast: Secondary | ICD-10-CM | POA: Diagnosis not present

## 2017-10-04 ENCOUNTER — Telehealth: Payer: Self-pay | Admitting: Nurse Practitioner

## 2017-10-04 ENCOUNTER — Ambulatory Visit
Admission: RE | Admit: 2017-10-04 | Discharge: 2017-10-04 | Disposition: A | Discharge status: Home / Self Care | Payer: Medicare Other | Source: Ambulatory Visit | Attending: Radiation Oncology | Admitting: Radiation Oncology

## 2017-10-04 DIAGNOSIS — Z170 Estrogen receptor positive status [ER+]: Secondary | ICD-10-CM | POA: Diagnosis not present

## 2017-10-04 DIAGNOSIS — C50411 Malignant neoplasm of upper-outer quadrant of right female breast: Secondary | ICD-10-CM | POA: Diagnosis not present

## 2017-10-04 DIAGNOSIS — C50112 Malignant neoplasm of central portion of left female breast: Secondary | ICD-10-CM | POA: Diagnosis not present

## 2017-10-04 DIAGNOSIS — Z510 Encounter for antineoplastic radiation therapy: Secondary | ICD-10-CM | POA: Diagnosis not present

## 2017-10-04 NOTE — Telephone Encounter (Signed)
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 She understands and appreciates for the call.

## 2017-10-07 ENCOUNTER — Ambulatory Visit
Admission: RE | Admit: 2017-10-07 | Discharge: 2017-10-07 | Disposition: A | Discharge status: Home / Self Care | Payer: Medicare Other | Source: Ambulatory Visit | Attending: Radiation Oncology | Admitting: Radiation Oncology

## 2017-10-07 DIAGNOSIS — Z510 Encounter for antineoplastic radiation therapy: Secondary | ICD-10-CM | POA: Diagnosis not present

## 2017-10-07 DIAGNOSIS — C50411 Malignant neoplasm of upper-outer quadrant of right female breast: Secondary | ICD-10-CM | POA: Diagnosis not present

## 2017-10-07 DIAGNOSIS — Z170 Estrogen receptor positive status [ER+]: Secondary | ICD-10-CM | POA: Diagnosis not present

## 2017-10-07 DIAGNOSIS — C50112 Malignant neoplasm of central portion of left female breast: Secondary | ICD-10-CM | POA: Diagnosis not present

## 2017-10-08 ENCOUNTER — Ambulatory Visit
Admission: RE | Admit: 2017-10-08 | Discharge: 2017-10-08 | Disposition: A | Discharge status: Home / Self Care | Payer: Medicare Other | Source: Ambulatory Visit | Attending: Radiation Oncology | Admitting: Radiation Oncology

## 2017-10-08 DIAGNOSIS — C50112 Malignant neoplasm of central portion of left female breast: Secondary | ICD-10-CM | POA: Diagnosis not present

## 2017-10-08 DIAGNOSIS — C50411 Malignant neoplasm of upper-outer quadrant of right female breast: Secondary | ICD-10-CM | POA: Diagnosis not present

## 2017-10-08 DIAGNOSIS — Z170 Estrogen receptor positive status [ER+]: Secondary | ICD-10-CM | POA: Diagnosis not present

## 2017-10-08 DIAGNOSIS — Z510 Encounter for antineoplastic radiation therapy: Secondary | ICD-10-CM | POA: Diagnosis not present

## 2017-10-09 ENCOUNTER — Ambulatory Visit
Admission: RE | Admit: 2017-10-09 | Discharge: 2017-10-09 | Disposition: A | Discharge status: Home / Self Care | Payer: Medicare Other | Source: Ambulatory Visit | Attending: Radiation Oncology | Admitting: Radiation Oncology

## 2017-10-09 DIAGNOSIS — C50411 Malignant neoplasm of upper-outer quadrant of right female breast: Secondary | ICD-10-CM | POA: Diagnosis not present

## 2017-10-09 DIAGNOSIS — Z510 Encounter for antineoplastic radiation therapy: Secondary | ICD-10-CM | POA: Diagnosis not present

## 2017-10-09 DIAGNOSIS — Z170 Estrogen receptor positive status [ER+]: Secondary | ICD-10-CM | POA: Diagnosis not present

## 2017-10-09 DIAGNOSIS — C50112 Malignant neoplasm of central portion of left female breast: Secondary | ICD-10-CM | POA: Diagnosis not present

## 2017-10-10 ENCOUNTER — Ambulatory Visit
Admission: RE | Admit: 2017-10-10 | Discharge: 2017-10-10 | Disposition: A | Discharge status: Home / Self Care | Payer: Medicare Other | Source: Ambulatory Visit | Attending: Radiation Oncology | Admitting: Radiation Oncology

## 2017-10-10 DIAGNOSIS — C50411 Malignant neoplasm of upper-outer quadrant of right female breast: Secondary | ICD-10-CM | POA: Diagnosis not present

## 2017-10-10 DIAGNOSIS — C50112 Malignant neoplasm of central portion of left female breast: Secondary | ICD-10-CM | POA: Diagnosis not present

## 2017-10-10 DIAGNOSIS — Z170 Estrogen receptor positive status [ER+]: Secondary | ICD-10-CM | POA: Diagnosis not present

## 2017-10-10 DIAGNOSIS — Z510 Encounter for antineoplastic radiation therapy: Secondary | ICD-10-CM | POA: Diagnosis not present

## 2017-10-11 ENCOUNTER — Ambulatory Visit
Admission: RE | Admit: 2017-10-11 | Discharge: 2017-10-11 | Disposition: A | Discharge status: Home / Self Care | Payer: Medicare Other | Source: Ambulatory Visit | Attending: Radiation Oncology | Admitting: Radiation Oncology

## 2017-10-11 DIAGNOSIS — Z170 Estrogen receptor positive status [ER+]: Secondary | ICD-10-CM | POA: Diagnosis not present

## 2017-10-11 DIAGNOSIS — C50411 Malignant neoplasm of upper-outer quadrant of right female breast: Secondary | ICD-10-CM | POA: Diagnosis not present

## 2017-10-11 DIAGNOSIS — C50112 Malignant neoplasm of central portion of left female breast: Secondary | ICD-10-CM | POA: Diagnosis not present

## 2017-10-11 DIAGNOSIS — Z510 Encounter for antineoplastic radiation therapy: Secondary | ICD-10-CM | POA: Diagnosis not present

## 2017-10-14 ENCOUNTER — Ambulatory Visit
Admission: RE | Admit: 2017-10-14 | Discharge: 2017-10-14 | Disposition: A | Discharge status: Home / Self Care | Payer: Medicare Other | Source: Ambulatory Visit | Attending: Radiation Oncology | Admitting: Radiation Oncology

## 2017-10-14 DIAGNOSIS — Z510 Encounter for antineoplastic radiation therapy: Secondary | ICD-10-CM | POA: Diagnosis not present

## 2017-10-14 DIAGNOSIS — C50112 Malignant neoplasm of central portion of left female breast: Secondary | ICD-10-CM

## 2017-10-14 DIAGNOSIS — C50411 Malignant neoplasm of upper-outer quadrant of right female breast: Secondary | ICD-10-CM

## 2017-10-14 DIAGNOSIS — Z170 Estrogen receptor positive status [ER+]: Secondary | ICD-10-CM | POA: Diagnosis not present

## 2017-10-14 MED ORDER — RADIAPLEXRX EX GEL
Freq: Once | CUTANEOUS | Status: AC
  Administered 2017-10-14: 12:00:00 via TOPICAL

## 2017-10-15 ENCOUNTER — Ambulatory Visit
Admission: RE | Admit: 2017-10-15 | Discharge: 2017-10-15 | Disposition: A | Discharge status: Home / Self Care | Payer: Medicare Other | Source: Ambulatory Visit | Attending: Radiation Oncology | Admitting: Radiation Oncology

## 2017-10-15 DIAGNOSIS — C50112 Malignant neoplasm of central portion of left female breast: Secondary | ICD-10-CM | POA: Diagnosis not present

## 2017-10-15 DIAGNOSIS — C50411 Malignant neoplasm of upper-outer quadrant of right female breast: Secondary | ICD-10-CM | POA: Diagnosis not present

## 2017-10-15 DIAGNOSIS — Z510 Encounter for antineoplastic radiation therapy: Secondary | ICD-10-CM | POA: Diagnosis not present

## 2017-10-15 DIAGNOSIS — Z170 Estrogen receptor positive status [ER+]: Secondary | ICD-10-CM | POA: Diagnosis not present

## 2017-10-16 ENCOUNTER — Ambulatory Visit
Admission: RE | Admit: 2017-10-16 | Discharge: 2017-10-16 | Disposition: A | Discharge status: Home / Self Care | Payer: Medicare Other | Source: Ambulatory Visit | Attending: Radiation Oncology | Admitting: Radiation Oncology

## 2017-10-16 DIAGNOSIS — C50112 Malignant neoplasm of central portion of left female breast: Secondary | ICD-10-CM | POA: Diagnosis not present

## 2017-10-16 DIAGNOSIS — Z170 Estrogen receptor positive status [ER+]: Secondary | ICD-10-CM | POA: Diagnosis not present

## 2017-10-16 DIAGNOSIS — Z510 Encounter for antineoplastic radiation therapy: Secondary | ICD-10-CM | POA: Diagnosis not present

## 2017-10-16 DIAGNOSIS — C50411 Malignant neoplasm of upper-outer quadrant of right female breast: Secondary | ICD-10-CM | POA: Diagnosis not present

## 2017-10-17 ENCOUNTER — Ambulatory Visit: Payer: Medicare Other

## 2017-10-17 ENCOUNTER — Other Ambulatory Visit: Payer: Medicare Other

## 2017-10-17 ENCOUNTER — Ambulatory Visit
Admission: RE | Admit: 2017-10-17 | Discharge: 2017-10-17 | Disposition: A | Discharge status: Home / Self Care | Payer: Medicare Other | Source: Ambulatory Visit | Attending: Radiation Oncology | Admitting: Radiation Oncology

## 2017-10-17 DIAGNOSIS — Z510 Encounter for antineoplastic radiation therapy: Secondary | ICD-10-CM | POA: Diagnosis not present

## 2017-10-17 DIAGNOSIS — C50411 Malignant neoplasm of upper-outer quadrant of right female breast: Secondary | ICD-10-CM | POA: Diagnosis not present

## 2017-10-17 DIAGNOSIS — Z170 Estrogen receptor positive status [ER+]: Secondary | ICD-10-CM | POA: Diagnosis not present

## 2017-10-17 DIAGNOSIS — C50112 Malignant neoplasm of central portion of left female breast: Secondary | ICD-10-CM | POA: Diagnosis not present

## 2017-10-18 ENCOUNTER — Ambulatory Visit
Admission: RE | Admit: 2017-10-18 | Discharge: 2017-10-18 | Disposition: A | Discharge status: Home / Self Care | Payer: Medicare Other | Source: Ambulatory Visit | Attending: Radiation Oncology | Admitting: Radiation Oncology

## 2017-10-18 DIAGNOSIS — Z170 Estrogen receptor positive status [ER+]: Secondary | ICD-10-CM | POA: Diagnosis not present

## 2017-10-18 DIAGNOSIS — Z510 Encounter for antineoplastic radiation therapy: Secondary | ICD-10-CM | POA: Diagnosis not present

## 2017-10-18 DIAGNOSIS — C50112 Malignant neoplasm of central portion of left female breast: Secondary | ICD-10-CM | POA: Diagnosis not present

## 2017-10-18 DIAGNOSIS — C50411 Malignant neoplasm of upper-outer quadrant of right female breast: Secondary | ICD-10-CM | POA: Diagnosis not present

## 2017-10-21 ENCOUNTER — Ambulatory Visit
Admission: RE | Admit: 2017-10-21 | Discharge: 2017-10-21 | Disposition: A | Discharge status: Home / Self Care | Payer: Medicare Other | Source: Ambulatory Visit | Attending: Radiation Oncology | Admitting: Radiation Oncology

## 2017-10-21 DIAGNOSIS — Z170 Estrogen receptor positive status [ER+]: Secondary | ICD-10-CM | POA: Diagnosis not present

## 2017-10-21 DIAGNOSIS — C50411 Malignant neoplasm of upper-outer quadrant of right female breast: Secondary | ICD-10-CM | POA: Diagnosis not present

## 2017-10-21 DIAGNOSIS — Z510 Encounter for antineoplastic radiation therapy: Secondary | ICD-10-CM | POA: Diagnosis not present

## 2017-10-21 DIAGNOSIS — C50112 Malignant neoplasm of central portion of left female breast: Secondary | ICD-10-CM | POA: Diagnosis not present

## 2017-10-22 ENCOUNTER — Ambulatory Visit
Admission: RE | Admit: 2017-10-22 | Discharge: 2017-10-22 | Disposition: A | Discharge status: Home / Self Care | Payer: Medicare Other | Source: Ambulatory Visit | Attending: Radiation Oncology | Admitting: Radiation Oncology

## 2017-10-22 DIAGNOSIS — Z510 Encounter for antineoplastic radiation therapy: Secondary | ICD-10-CM | POA: Diagnosis not present

## 2017-10-22 DIAGNOSIS — C50112 Malignant neoplasm of central portion of left female breast: Secondary | ICD-10-CM | POA: Diagnosis not present

## 2017-10-22 DIAGNOSIS — Z170 Estrogen receptor positive status [ER+]: Secondary | ICD-10-CM | POA: Diagnosis not present

## 2017-10-22 DIAGNOSIS — C50411 Malignant neoplasm of upper-outer quadrant of right female breast: Secondary | ICD-10-CM | POA: Diagnosis not present

## 2017-10-23 ENCOUNTER — Ambulatory Visit
Admission: RE | Admit: 2017-10-23 | Discharge: 2017-10-23 | Disposition: A | Discharge status: Home / Self Care | Payer: Medicare Other | Source: Ambulatory Visit | Attending: Radiation Oncology | Admitting: Radiation Oncology

## 2017-10-23 DIAGNOSIS — C50811 Malignant neoplasm of overlapping sites of right female breast: Secondary | ICD-10-CM | POA: Diagnosis not present

## 2017-10-23 DIAGNOSIS — Z170 Estrogen receptor positive status [ER+]: Secondary | ICD-10-CM | POA: Insufficient documentation

## 2017-10-23 DIAGNOSIS — C50411 Malignant neoplasm of upper-outer quadrant of right female breast: Secondary | ICD-10-CM | POA: Diagnosis not present

## 2017-10-23 DIAGNOSIS — Z510 Encounter for antineoplastic radiation therapy: Secondary | ICD-10-CM | POA: Diagnosis not present

## 2017-10-23 DIAGNOSIS — C50112 Malignant neoplasm of central portion of left female breast: Secondary | ICD-10-CM | POA: Diagnosis not present

## 2017-10-24 ENCOUNTER — Ambulatory Visit
Admission: RE | Admit: 2017-10-24 | Discharge: 2017-10-24 | Disposition: A | Discharge status: Home / Self Care | Payer: Medicare Other | Source: Ambulatory Visit | Attending: Radiation Oncology | Admitting: Radiation Oncology

## 2017-10-24 DIAGNOSIS — Z510 Encounter for antineoplastic radiation therapy: Secondary | ICD-10-CM | POA: Diagnosis not present

## 2017-10-24 DIAGNOSIS — C50112 Malignant neoplasm of central portion of left female breast: Secondary | ICD-10-CM | POA: Diagnosis not present

## 2017-10-24 DIAGNOSIS — Z170 Estrogen receptor positive status [ER+]: Secondary | ICD-10-CM | POA: Diagnosis not present

## 2017-10-24 DIAGNOSIS — C50411 Malignant neoplasm of upper-outer quadrant of right female breast: Secondary | ICD-10-CM | POA: Diagnosis not present

## 2017-10-24 DIAGNOSIS — C50811 Malignant neoplasm of overlapping sites of right female breast: Secondary | ICD-10-CM | POA: Diagnosis not present

## 2017-10-25 ENCOUNTER — Ambulatory Visit
Admission: RE | Admit: 2017-10-25 | Discharge: 2017-10-25 | Disposition: A | Discharge status: Home / Self Care | Payer: Medicare Other | Source: Ambulatory Visit | Attending: Radiation Oncology | Admitting: Radiation Oncology

## 2017-10-25 DIAGNOSIS — Z170 Estrogen receptor positive status [ER+]: Secondary | ICD-10-CM | POA: Diagnosis not present

## 2017-10-25 DIAGNOSIS — C50112 Malignant neoplasm of central portion of left female breast: Secondary | ICD-10-CM | POA: Diagnosis not present

## 2017-10-25 DIAGNOSIS — C50411 Malignant neoplasm of upper-outer quadrant of right female breast: Secondary | ICD-10-CM | POA: Diagnosis not present

## 2017-10-25 DIAGNOSIS — Z510 Encounter for antineoplastic radiation therapy: Secondary | ICD-10-CM | POA: Diagnosis not present

## 2017-10-25 DIAGNOSIS — C50811 Malignant neoplasm of overlapping sites of right female breast: Secondary | ICD-10-CM | POA: Diagnosis not present

## 2017-10-28 ENCOUNTER — Ambulatory Visit
Admission: RE | Admit: 2017-10-28 | Discharge: 2017-10-28 | Disposition: A | Discharge status: Home / Self Care | Payer: Medicare Other | Source: Ambulatory Visit | Attending: Radiation Oncology | Admitting: Radiation Oncology

## 2017-10-28 DIAGNOSIS — C50811 Malignant neoplasm of overlapping sites of right female breast: Secondary | ICD-10-CM | POA: Diagnosis not present

## 2017-10-28 DIAGNOSIS — Z170 Estrogen receptor positive status [ER+]: Secondary | ICD-10-CM | POA: Diagnosis not present

## 2017-10-28 DIAGNOSIS — Z510 Encounter for antineoplastic radiation therapy: Secondary | ICD-10-CM | POA: Diagnosis not present

## 2017-10-28 DIAGNOSIS — C50112 Malignant neoplasm of central portion of left female breast: Secondary | ICD-10-CM | POA: Diagnosis not present

## 2017-10-28 DIAGNOSIS — C50411 Malignant neoplasm of upper-outer quadrant of right female breast: Secondary | ICD-10-CM | POA: Diagnosis not present

## 2017-10-29 ENCOUNTER — Ambulatory Visit
Admission: RE | Admit: 2017-10-29 | Discharge: 2017-10-29 | Disposition: A | Discharge status: Home / Self Care | Payer: Medicare Other | Source: Ambulatory Visit | Attending: Radiation Oncology | Admitting: Radiation Oncology

## 2017-10-29 DIAGNOSIS — Z170 Estrogen receptor positive status [ER+]: Secondary | ICD-10-CM | POA: Diagnosis not present

## 2017-10-29 DIAGNOSIS — C50112 Malignant neoplasm of central portion of left female breast: Secondary | ICD-10-CM | POA: Diagnosis not present

## 2017-10-29 DIAGNOSIS — Z510 Encounter for antineoplastic radiation therapy: Secondary | ICD-10-CM | POA: Diagnosis not present

## 2017-10-29 DIAGNOSIS — C50811 Malignant neoplasm of overlapping sites of right female breast: Secondary | ICD-10-CM | POA: Diagnosis not present

## 2017-10-29 DIAGNOSIS — C50411 Malignant neoplasm of upper-outer quadrant of right female breast: Secondary | ICD-10-CM | POA: Diagnosis not present

## 2017-10-30 ENCOUNTER — Ambulatory Visit
Admission: RE | Admit: 2017-10-30 | Discharge: 2017-10-30 | Disposition: A | Discharge status: Home / Self Care | Payer: Medicare Other | Source: Ambulatory Visit | Attending: Radiation Oncology | Admitting: Radiation Oncology

## 2017-10-30 ENCOUNTER — Other Ambulatory Visit: Payer: Self-pay

## 2017-10-30 ENCOUNTER — Encounter (HOSPITAL_BASED_OUTPATIENT_CLINIC_OR_DEPARTMENT_OTHER): Payer: Self-pay | Admitting: *Deleted

## 2017-10-30 DIAGNOSIS — Z170 Estrogen receptor positive status [ER+]: Secondary | ICD-10-CM | POA: Diagnosis not present

## 2017-10-30 DIAGNOSIS — C50811 Malignant neoplasm of overlapping sites of right female breast: Secondary | ICD-10-CM | POA: Diagnosis not present

## 2017-10-30 DIAGNOSIS — Z510 Encounter for antineoplastic radiation therapy: Secondary | ICD-10-CM | POA: Diagnosis not present

## 2017-10-31 ENCOUNTER — Ambulatory Visit
Admission: RE | Admit: 2017-10-31 | Discharge: 2017-10-31 | Disposition: A | Discharge status: Home / Self Care | Payer: Medicare Other | Source: Ambulatory Visit | Attending: Radiation Oncology | Admitting: Radiation Oncology

## 2017-10-31 DIAGNOSIS — Z170 Estrogen receptor positive status [ER+]: Secondary | ICD-10-CM | POA: Diagnosis not present

## 2017-10-31 DIAGNOSIS — Z510 Encounter for antineoplastic radiation therapy: Secondary | ICD-10-CM | POA: Diagnosis not present

## 2017-10-31 DIAGNOSIS — C50811 Malignant neoplasm of overlapping sites of right female breast: Secondary | ICD-10-CM | POA: Diagnosis not present

## 2017-10-31 NOTE — Progress Notes (Signed)
PT in to pick up Ensure pre op drink, instructions reviewed.

## 2017-11-01 ENCOUNTER — Ambulatory Visit
Admission: RE | Admit: 2017-11-01 | Discharge: 2017-11-01 | Disposition: A | Discharge status: Home / Self Care | Payer: Medicare Other | Source: Ambulatory Visit | Attending: Radiation Oncology | Admitting: Radiation Oncology

## 2017-11-01 DIAGNOSIS — Z510 Encounter for antineoplastic radiation therapy: Secondary | ICD-10-CM | POA: Diagnosis not present

## 2017-11-01 DIAGNOSIS — C50811 Malignant neoplasm of overlapping sites of right female breast: Secondary | ICD-10-CM | POA: Diagnosis not present

## 2017-11-01 DIAGNOSIS — Z170 Estrogen receptor positive status [ER+]: Secondary | ICD-10-CM | POA: Diagnosis not present

## 2017-11-04 ENCOUNTER — Ambulatory Visit
Admission: RE | Admit: 2017-11-04 | Discharge: 2017-11-04 | Disposition: A | Discharge status: Home / Self Care | Payer: Medicare Other | Source: Ambulatory Visit | Attending: Radiation Oncology | Admitting: Radiation Oncology

## 2017-11-04 DIAGNOSIS — Z170 Estrogen receptor positive status [ER+]: Secondary | ICD-10-CM | POA: Diagnosis not present

## 2017-11-04 DIAGNOSIS — C50811 Malignant neoplasm of overlapping sites of right female breast: Secondary | ICD-10-CM | POA: Diagnosis not present

## 2017-11-04 DIAGNOSIS — Z510 Encounter for antineoplastic radiation therapy: Secondary | ICD-10-CM | POA: Diagnosis not present

## 2017-11-04 DIAGNOSIS — C50112 Malignant neoplasm of central portion of left female breast: Secondary | ICD-10-CM

## 2017-11-04 MED ORDER — RADIAPLEXRX EX GEL
Freq: Once | CUTANEOUS | Status: AC
  Administered 2017-11-04: 15:00:00 via TOPICAL

## 2017-11-04 NOTE — Progress Notes (Signed)
[place]  [phone] [phone]  Clinic Follow Up Note   Patient Care Team: [name], MD as PCP - General (Family Medicine) [name], MD as Consulting Physician (Hematology) [name], NP as Nurse Practitioner (Nurse Practitioner) [name], MD as Consulting Physician (General Surgery)   Date of Service:  11/05/2017  CHIEF COMPLAINTS:  F/u bilateral breast cancer    Oncology History   Cancer Staging Cancer of central portion of left breast [facility]) Staging form: Breast, AJCC 8th Edition - Clinical stage from 05/03/2017: Stage IA (cT1a, cN0, cM0, G1, ER: Positive, PR: Positive, HER2: Negative) - Signed by [name], MD on 05/07/2017 - Pathologic stage from 08/22/2017: No Stage Recommended (ypT0, pN0, cM0, GX, ER: Unknown, PR: Unknown, HER2: Unknown) - Signed by [name], MD on 11/06/2017  Cancer of overlapping sites of right female breast [facility]) Staging form: Breast, AJCC 8th Edition - Clinical stage from 04/24/2017: Stage IIA (cT3, cN0, cM0, G2, ER+, PR+, HER2-) - Signed by [name], MD on 11/06/2017 - Pathologic stage from 08/22/2017: No Stage Recommended (ypT3, pN0(i+), cM0, G2, ER+, PR+, HER2-) - Signed by [name], MD on 10/29/2017       Cancer of overlapping sites of right female breast ([place])   03/08/2017 Mammogram    IMPRESSION: 1. Patient has a is palpable mass in the lateral right breast, in the location dense fibroglandular breast tissue. Although no discrete mass is seen sonographically or mammographically, the Clinical change remains concerning. Biopsy is recommended.  RECOMMENDATION: 1. Ultrasound-guided core needle biopsy of palpable abnormality in the lateral right breast.      04/24/2017 Breast US    IMPRESSION: Ultrasound guided biopsy of the right breast. No apparent complications.      04/24/2017 Initial Biopsy    Breast, right, needle core biopsy, UOQ, centered at 9:30 o'clock - INVASIVE  MAMMARY CARCINOMA. - SEE COMMENT. ER 100% positive PR 100% positive Ki67 10% HER2 FISH positive (ratio 2.10, copy number 3.30)  HER2 IHC was done on the biopsy sample after surgery, and was negative (1+). So the final HER2 is NEGATIVE (determined after surgery)      05/02/2017 Breast MRI    IMPRESSION: 1. 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 2. 4 mm enhancing mass with associated architectural distortion in the central left breast. This is suspicious for additional focus of carcinoma.      05/07/2017 Initial Diagnosis    Cancer of overlapping sites of right female breast ([place])      05/15/2017 Echocardiogram    ECHO 05/15/17  Impressions: - LVEF 60-65%, normal wall thickness, normal wall motion and GLS   strain, grade 1 DD, normal LV filling pressure, mild MR, normal   LA size, normal IVC.         05/22/2017 Imaging    CT CAP W Contrast 05/22/17 IMPRESSION: 1. No specific CT findings of nodal or other metastatic disease in the chest, abdomen, and pelvis. 2. Bosniak category 101F cyst in the right mid kidney, with mild enhancement along a septation but without nodularity. Unless follow up abdominal scans related to the patient's breast cancer adequately characterize this lesion, renal protocol MRI or CT scan would be recommended in 6 months time. 3. Other imaging findings of potential clinical significance: The Aortic Atherosclerosis (ICD10-I70.0). Ectatic ascending thoracic aorta without aneurysm. Small type 1 hiatal hernia. Several small hypodense liver lesions are likely benign  although technically too small to characterize. Nonobstructive 1.2 cm right renal calculus. Anterior uterine fundal myometrial mass favoring fibroid. Pelvic floor laxity with small cystocele. Notable spondylosis at L3-4 and L5-S1.       05/22/2017 Imaging    Bone Scan Whole Body 05/22/17 IMPRESSION: 1. No  findings of osseous metastatic disease. 2. Lower lumbar activity corresponds to areas of significant benign spondylosis on the CT scan.      05/24/2017 -  Chemotherapy    TCHP every 3 weeks for 6 cycles starting 05/24/17 followed by a year of maintenance Herceptin and Perjeta. Given lack of response she stopped TCHP after 4 cycles.  She will proceed with Maintenance Herceptin and Perjeta on 08/16/17.         07/12/2017 - 07/14/2017 Hospital Admission    Admit date: 07/12/17 Admission diagnosis: GI Hemorrhaging  Additional comments: Colonoscopy and EGD done with no evidence of bleeding site. Given blood transfusion on 07/15/17      08/02/2017 Imaging    MRI Breast Bilateral 2/16//18 IMPRESSION: 1. The known lobular carcinoma of the right breast has not significantly changed since the prior MRI from 05/02/2017. The disease spans approximately 5.6 cm, previously 5.9 cm in the anterior to posterior dimension. 2. 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 3.  No new suspicious findings in either breast. 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      08/16/2017 -  Anti-estrogen oral therapy    Letrozole once daily starting 08/16/17       08/22/2017 Surgery     RIGHT MASTECTOMY WITH SENTINEL LYMPH NODE BIOPSY ERAS PATHWAY by Dr. Georgette Dover      08/22/2017 Pathology Results    See the oncology history under left breast cancer       Cancer of central portion of left breast ([place])   05/02/2017 Breast MRI    IMPRESSION: 1. 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 2. 4 mm enhancing mass with associated architectural distortion in the central left breast. This is suspicious for additional focus of carcinoma.       05/03/2017 Mammogram    IMPRESSION: 1. Suspicious area of architectural distortion in the left breast which corresponds to a small enhancing mass on MRI.  RECOMMENDATION: Stereotactic core needle biopsy of the area of architectural distortion in the left breast.       05/03/2017 Breast US    Stereotactic core needle biopsy of the area of architectural distortion in the left breast.      05/03/2017 Initial Biopsy    Breast, left, needle core biopsy, central, slightly toward the lower, outer quadrant - INVASIVE DUCTAL CARCINOMA, MSBR GRADE 1/2. - SEE MICROSCOPIC DESCRIPTION. ER 50% positive PR 90% positive Ki67: 1% HER2 negative       05/07/2017 Initial Diagnosis    Cancer of central portion of left breast ([place])      08/22/2017 Surgery    LEFT BREAST LUMPECTOMY WITH RADIOACTIVE SEED AND SENTINEL LYMPH NODE BIOPSY ERAS PATHWAY by Dr. Georgette Dover 08/22/17       08/22/2017 Pathology Results    Diagnosis 08/22/17 1. Breast, lumpectomy, Left - ATYPICAL DUCTAL HYPERPLASIA - FIBROCYSTIC CHANGES - PREVIOUS BIOPSY SITE CHANGES - SEE COMMENT 2. Lymph node, sentinel, biopsy, a total  of 4 nodes, all negative for malignant cells  6. Breast, simple mastectomy, Right - INVASIVE LOBULAR CARCINOMA, NOTTINGHAM GRADE 2/3, 6.0 CM - LOBULAR NEOPLASIA (ATYPICAL LOBULAR HYPERPLASIA) - PREVIOUS BIOPSY SITE CHANGES 7. One out of  6 SLN positive for malignant cells   Repeat HER2 IHC was negative (0) on sample 6      HISTORY OF PRESENTING ILLNESS:  Madison Stokes 72 y.o. female is here because of newly diagnosed bilateral breast cancer. She was referred by Dr. Georgette Dover. She presents to clinic accompanied by her daughter in law and husband. She noticed a right breast mass in early September 2018, she underwent diagnostic right mammogram with ultrasound on 03/08/17; no discrete mass was seen sonographically or mammographically. She underwent right breast biopsy with flip placement on 04/24/17, found  to be invasive mammary carcinoma, favoring lobular type. She was referred to Dr. Georgette Dover who then ordered bilateral breast MRI. Imaging identified non mass enhancement spanning 5.9 x 3.2 x 5.6 cm in the right breast as well as a small enhancing 4 mm mass in the central left breast with a small hypoechoic focus. She then underwent diagnostic mammogram with [place] and biopsy in the left breast. Pathology revealed invasive ductal carcinoma. Her last mammogram was 04/2016, no abnormal mammogram or previous breast biopsy. She has not noticed associated breast changes, nipple discharge or inversion, or skin changes. No weight loss, decreased appetite, or fatigue.   She has no significant past medical history, she is on blood pressure and cholesterol medication. She is retired Network engineer, lives with her husband; has 2 adult sons. No tobacco or alcohol history. She reports having a cold lately she caught from her grandson but otherwise feels well. Occasionally has urgent BM after meals, denies abd fullness, pain, early satiety, nausea or vomiting.   GYN HISTORY  Menarchal: 33 LMP: age 70, menopause  Contraceptive:  HRT: None GP: 2 pregnancies, 2 births    CURRENT THERAPY: adjuvant Letrozole once daily started 08/16/17    INTERVAL HISTORY:  Akyia Borelli is here for a follow up for her bilateral breast cancer post b/l breast surgery. She is accompanied by her husband and daughter. She has just finished adjuvant radiation today, has moderate radiation dermatitis, no significant pain, mild fatigue, no other issues with radiation. She is tolerating letrozole well, with mild hot flush, no other complains.    SOCIAL HISTORY: Social History   Socioeconomic History  . Marital status: Married    Spouse name: Not on file  . Number of children: Not on file  . Years of education: Not on file  . Highest education level: Not on file  Occupational History  . Not on file  Social Needs  . Financial resource strain:  Not on file  . Food insecurity:    Worry: Not on file    Inability: Not on file  . Transportation needs:    Medical: Not on file    Non-medical: Not on file  Tobacco Use  . Smoking status: Never Smoker  . Smokeless tobacco: Never Used  Substance and Sexual Activity  . Alcohol use: Yes    Comment: not recently  . Drug use: No  . Sexual activity: Not on file  Lifestyle  . Physical activity:    Days per week: Not on file    Minutes per session: Not on file  . Stress: Not on file  Relationships  . Social connections:    Talks on phone: Not on file    Gets together:  Not on file    Attends religious service: Not on file    Active member of club or organization: Not on file    Attends meetings of clubs or organizations: Not on file    Relationship status: Not on file  . Intimate partner violence:    Fear of current or ex partner: Not on file    Emotionally abused: Not on file    Physically abused: Not on file    Forced sexual activity: Not on file  Other Topics Concern  . Not on file  Social History Narrative  . Not on file    FAMILY HISTORY: Family History  Problem Relation Age of Onset  . Cancer Mother 14       uterine  . Cancer Brother        prostate  . Heart disease Father   . Heart attack Father   . Diabetes Father   . Hypertension Father     ALLERGIES:  has No Known Allergies.  MEDICATIONS:  Current Outpatient Medications  Medication Sig Dispense Refill  . aspirin EC 81 MG tablet Take 81 mg by mouth daily.    . irbesartan (AVAPRO) 150 MG tablet Take 150 mg by mouth daily.     Marland Kitchen letrozole (FEMARA) 2.5 MG tablet Take 1 tablet (2.5 mg total) by mouth daily. 90 tablet 3  . lidocaine-prilocaine (EMLA) cream Apply to affected area once (Patient taking differently: Apply 1 application topically daily as needed (prior to port being accessed. (every 3 weeks)). Apply to affected area once) 30 g 3  . loperamide (IMODIUM) 2 MG capsule Take 2-4 mg by mouth 4 (four)  times daily as needed for diarrhea or loose stools.     Marland Kitchen loratadine (CLARITIN) 10 MG tablet Take 10 mg by mouth daily as needed for allergies.    . Multiple Vitamin (MULTIVITAMIN) tablet Take 1 tablet daily by mouth.    . prochlorperazine (COMPAZINE) 10 MG tablet Take 1 tablet (10 mg total) every 6 (six) hours as needed by mouth (Nausea or vomiting). 30 tablet 1  . simvastatin (ZOCOR) 40 MG tablet Take 40 mg at bedtime by mouth.    Marland Kitchen VITAMIN B COMPLEX-C PO Take by mouth.     No current facility-administered medications for this visit.     REVIEW OF SYSTEMS:  Constitutional: Denies fatigue, fevers, chills or abnormal night sweats  (+) hot flashes, tolerable Eyes: Denies blurriness of vision, double vision or watery eyes Ears, nose, mouth, throat, and face: Denies mucositis or sore throat  Respiratory: Denies dyspnea or wheezes  Cardiovascular: Denies palpitation, chest discomfort or lower extremity swelling Gastrointestinal:  Denies nausea, vomiting   Skin: negative Lymphatics: Denies new lymphadenopathy or easy bruising Neurological:Denies new weaknesses (+) tingling in her feet Behavioral/Psych: Mood is stable, no new changes  All other systems were reviewed with the patient and are negative.  PHYSICAL EXAMINATION:  ECOG PERFORMANCE STATUS: 1  Vitals:   11/05/17 1257  BP: 138/84  Pulse: 94  Resp: 16  Temp: 98.2 F (36.8 C)  SpO2: 97%   Filed Weights   11/05/17 1257  Weight: 124 lb 14.4 oz (56.7 kg)     GENERAL:alert, no distress and comfortable SKIN: skin color, texture, turgor are normal, no rashes or significant lesions EYES: normal, conjunctiva are pink and non-injected, sclera clear OROPHARYNX:no exudate, no erythema and lips, buccal mucosa, and tongue normal  NECK: supple, thyroid normal size, non-tender, without nodularity LYMPH:  no palpable cervical, supraclavicular, axillary, or  inguinal lymphadenopathy  LUNGS: clear to auscultation bilaterally with normal  breathing effort HEART: regular rate & rhythm and no murmurs and no lower extremity edema ABDOMEN:abdomen soft, non-tender and normal bowel sounds. No palpable hepatomegaly  Musculoskeletal:no cyanosis of digits and no clubbing  PSYCH: alert & oriented x 3 with fluent speech NEURO: no focal motor/sensory deficits BREASTS: S/p right mastectomy and left lumpectomy: (+) right breast surgically absent, surgical scars has healed well. (+) diffuse skin erythema and some skin peeling on right, no palpable mass or adenopathy.    CBC Latest Ref Rng & Units 11/05/2017 09/26/2017 09/05/2017  WBC 3.9 - 10.3 K/uL 4.4 3.3(L) 3.8(L)  Hemoglobin 11.6 - 15.9 g/dL 11.4(L) 9.6(L) 8.0(L)  Hematocrit 34.8 - 46.6 % 36.1 30.6(L) 24.5(L)  Platelets 145 - 400 K/uL 211 264 225   CMP Latest Ref Rng & Units 11/05/2017 09/26/2017 09/05/2017  Glucose 70 - 140 mg/dL 170(H) 99 134  BUN 7 - 26 mg/dL _0 Creatinine 0.60 - 1.10 mg/dL 1.13(H) 0.78 0.79  Sodium 136 - 145 mmol/L 143 143 143  Potassium 3.5 - 5.1 mmol/L 4.4 4.2 4.2  Chloride 98 - 109 mmol/L 106 109 110(H)  CO2 22 - 29 mmol/L _1 Calcium 8.4 - 10.4 mg/dL 9.4 9.4 8.7  Total Protein 6.4 - 8.3 g/dL 6.8 6.4 5.9(L)  Total Bilirubin 0.2 - 1.2 mg/dL <0.2(L) 0.3 0.2  Alkaline Phos 40 - 150 U/L 100 98 97  AST 5 - 34 U/L 32 27 30  ALT 0 - 55 U/L 26 27 39     CA 27.29  05/13/17: 59.5 05/31/17: 50.0 07/04/17: 44.2 08/02/17: 49.6 09/05/17: 31.7 09/26/2017: 30.7   Diagnosis 08/22/17 1. Breast, lumpectomy, Left - ATYPICAL DUCTAL HYPERPLASIA - FIBROCYSTIC CHANGES - PREVIOUS BIOPSY SITE CHANGES - SEE COMMENT 2. Lymph node, sentinel, biopsy, Left Axillary #1 - NO CARCINOMA IDENTIFIED IN ONE LYMPH NODE (0/1) - SEE COMMENT 3. Lymph node, sentinel, biopsy, Left Axillary #2 - NO CARCINOMA IDENTIFIED IN ONE LYMPH NODE (0/1) - SEE COMMENT 4. Lymph node, sentinel, biopsy, Left Axillary #3 - NO CARCINOMA IDENTIFIED IN ONE LYMPH NODE (0/1) - SEE COMMENT 5. Lymph  node, sentinel, biopsy, Left - NO CARCINOMA IDENTIFIED IN ONE LYMPH NODE (0/1) - SEE COMMENT 6. 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 Lymph node, sentinel, biopsy, Right Axillary #1 - NO CARCINOMA IDENTIFIED IN ONE LYMPH NODE (0/1) - SEE COMMENT 8. Lymph node, sentinel, biopsy, Right - NO CARCINOMA IDENTIFIED IN ONE LYMPH NODE (0/1) - SEE COMMENT 9. Lymph node, sentinel, biopsy, Right - NO CARCINOMA IDENTIFIED IN ONE LYMPH NODE (0/1) 1 of 5 FINAL for Madison Stokes, Madison Stokes ([id number]) Diagnosis(continued) - SEE COMMENT 10. Lymph node, sentinel, biopsy, Right Axillary #2 - METASTATIC CARCINOMA PRESENT IN ONE LYMPH NODE (1/1) - SEE COMMENT 11. Lymph node, sentinel, biopsy, Right Axillary #3 - NO CARCINOMA IDENTIFIED IN ONE LYMPH NODE (0/1) - SEE COMMENT 12. Lymph node, sentinel, biopsy, Right - NO CARCINOMA IDENTIFIED IN ONE LYMPH NODE (0/1) - SEE COMMENT Microscopic Comment 1. An oncology table was not completed on this specimen because here was no residual carcinoma identified. 2. -5 and 7-12. Cytokeratin AE1/3 was utilized to exclude micrometastasis. Only one lymph node has isolated tumor cells (see part 10); all other lymph nodes were negative. 6. BREAST, STATUS POST NEOADJUVANT TREATMENT Procedure:  Mastectomy Laterality: Right Tumor Size: 6 x 3.5x 2.5 cm (see comment) Histologic Type: Invasive lobular carcinoma Grade: Nottingham Grade 2 Tubular Differentiation: 3 Nuclear Pleomorphism: 3 Mitotic Count: 1 Ductal Carcinoma in Situ (DCIS): Not identified Regional Lymph Nodes: Number of Lymph Nodes Examined:10 Number of Sentinel Lymph Nodes Examined: 10 Lymph Nodes with Macrometastases: 0 Lymph Nodes with Micrometastases: 0 Lymph Nodes with Isolated Tumor Cells: 1 Margins: Uninvolved by  carcinoma Invasive carcinoma, distance from closest margin: 0.9 (anterior soft tissue margin) DCIS, distance from closest margin: N/A Breast Prognostic Profile (pre-neoadjuvant case #: [id number]) Estrogen Receptor: Positive (100%, strong) Progesterone Receptor: Positive (100%, strong) Her2: Positive (Ratio: 2.10) Ki-67: 10% Residual Cancer Burden (RCB): Primary Tumor Bed: 60 mm x 60m Overall Cancer Cellularity: 30% Percentage of Cancer that is in Situ: N/A Number of Positive Lymph Nodes: 1 Diameter of Largest Lymph Node metastasis: 1 mm Residual Cancer Burden : 3.186 Residual Cancer Burden Class: RCB-II Pathologic Stage Classification (p TNM, AJCC 8th Edition): Primary Tumor: ypT3 Regional Lymph Nodes: ypN0(i+) COMMENT: There is residual tumor present in all of the submitted blocks from the upper and lower outer quadrant of the breast and therefore the measurement of the tumor is based on the gross measurement.  6. By immunohistochemistry, the tumor cells are negative for Her2 (0).    Diagnosis 05/03/17  Breast, left, needle core biopsy, central, slightly toward the lower, outer quadrant - INVASIVE DUCTAL CARCINOMA, MSBR GRADE 1/2. - SEE MICROSCOPIC DESCRIPTION. ER 50% positive PR 90% positive Ki67 1% Microscopic Comment Breast prognostic profile will be performed. Called to [facility] 05/06/2017.   Diagnosis 04/24/17  Breast, right, needle core biopsy, UOQ, centered at 9:30 o'clock - INVASIVE MAMMARY CARCINOMA. - SEE COMMENT. Microscopic Comment The carcinoma appears grade II. An E-cadherin and a breast prognostic profile will be performed and the results reported separately. The results were called to [facility] 04/25/2017. (JBK:ecj 04/25/2017) Results: IMMUNOHISTOCHEMICAL AND MORPHOMETRIC ANALYSIS PERFORMED MANUALLY Estrogen Receptor: 100%, POSITIVE, STRONG STAINING INTENSITY Progesterone Receptor: 100%, POSITIVE,  STRONG STAINING INTENSITY Proliferation Marker Ki67: 10% HER2: **POSITIVE** RATIO OF HER2/CEP17 SIGNALS 2.10 AVERAGE HER2 COPY NUMBER PER CELL 3.30  PROCEDURES  Colonoscopy by Dr. KTherisa Doyne1/20/19 IMPRESSION:  - The examined portion of the ileum was normal. - The entire examined colon is normal. - The distal rectum and anal verge are normal on retroflexion view. - No specimens collected    EGD by Dr. KTherisa Doyne1/19/19 IMPRESSION: - Normal esophagus. - Z-line regular, 36 cm from the incisors. - 2 cm hiatal hernia. - Non-bleeding erosive gastropathy. - Erythematous mucosa in the prepyloric region of the stomach. - Duodenal erosions without bleeding. - No specimens collected.   ECHO 05/15/17  Impressions: - LVEF 60-65%, normal wall thickness, normal wall motion and GLS   strain, grade 1 DD, normal LV filling pressure, mild MR, normal   LA size, normal IVC.   RADIOGRAPHIC STUDIES: I have personally reviewed the radiological images as listed and agreed with the findings in the report.  Whole Body Bone Scan 05/22/17 IMPRESSION: 1. No findings of osseous metastatic disease. 2. Lower lumbar activity corresponds to areas of significant benign spondylosis on the CT scan  CT CAP with Contrast IMPRESSION: 1. No specific CT findings of nodal or other metastatic disease in the chest, abdomen, and pelvis. 2. Bosniak category 65F cyst in the right mid kidney, with mild enhancement along a septation but without nodularity. Unless follow up abdominal scans related to  the patient's breast cancer adequately characterize this lesion, renal protocol MRI or CT scan would be recommended in 6 months time. 3. Other imaging findings of potential clinical significance: The Aortic Atherosclerosis (ICD10-I70.0). Ectatic ascending thoracic aorta without aneurysm. Small type 1 hiatal hernia. Several small hypodense liver lesions are likely benign although technically too small to characterize.  Nonobstructive 1.2 cm right renal calculus. Anterior uterine fundal myometrial mass favoring fibroid. Pelvic floor laxity with small cystocele. Notable spondylosis at L3-4 and L5-S1.  Diagnostic Mammogram Bilateral 05/03/17  IMPRESSION: 1. 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 2. 4 mm enhancing mass with associated architectural distortion in the central left breast. This is suspicious for additional focus of Carcinoma.  Diagnostic Mammogram, right 03/08/17 IMPRESSION: 1. Patient has a is palpable mass in the 9:30 position in the lateral right breast 2 cm from the nipple, in the location dense fibroglandular breast tissue. Although no discrete mass is seen sonographically or mammographically, the Clinical change remains concerning. Biopsy is recommended.  No results found.  ASSESSMENT & PLAN:  72 y.o. caucasian postmenopausal woman   1.  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 In her right breast, she has large area of disease that is HER2 initially diagnosed as positive by FISH. -Due to the high risk of recurrence from right breast cancer, she underwent neoadjuvnat chemo with TCHP -Due to her lack of response to chemotherapy, chemo was held after 4 cycles.  -She underwent right mastectomy and left breast lumpectomy with b/l sentinel lymph node biopsy on 08/22/17.  -Pathology results were reviewed with pt which showed a 6cm invasive ductal carcinoma in right breast, with 1 out of 6 positive lymph nodes.  Margins were negative.  Left breast invasive  ductal carcinoma had complete response to chemotherapy tumor.  We discussed right side lymph node dissection, giving her 1 out of 6 positive lymph nodes, I do not feel strongly she needs lymph node dissection.  -I request HER2 to be retested on her surgical right sided tumor, the IHC test was negative (0). I have requested to do HER2 IHC on her initial biopsy of right side tumor, and it was also negative (1+) -Given the final negative HER-2, I have stopped her Herceptin and pejeta maintenance therapy. -I do not recommend adjuvant chemotherapy as her tumor was not sensitive to chemo. -She has completed bilateral adjuvant radiation.  She tolerated well. -Pt has not decided on reconstruction at this time as breast cancer treatment is more urgent. I suggest a prosthetic bra in the meantime.  -She is tolerating letrozole well overall with manageable hot flashes. Continue letrozole daily for 10 years.  -She is scheduled for removal tomorrow. -We discussed breast cancer surveillance.  She will continue self-exam, routine office follow-up with lab and exam, annual screening mammograms.  We discussed the signs of recurrence and what to watch.  She voiced good understanding.   2. Genetics  -I reached out to genetics counselor to see if patient qualifies for genetics referral, she has synchronous bilateral ductal and lobular carcinoma, her brother has prostate cancer and her mother had  uterine cancer in her 36's. I will follow up.  -I previously referred her to genetics   3. 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 Will repeat scan in future    4. Anemia, secondary to chemotherapy, and history of GI bleeding in 06/2017 - Initially she was asymptomatic with no need for blood transfusion -She experienced significant GI bleeding with HG of 5.7 and HCT of 17.4 and was hospitalized on 07/12/17. GI workup showed no  site of bleeding. She received blood transfusion on 07/15/17.  -Hg recovered and showed good response to blood transfusion.  -continue PPI  -Hg at 8.0 today (09/05/17), no need for blood transfusion. Will check her iron study  -Anemia has nearly resolved, hemoglobin 11.4 today.  She feels well.   6. Bone Health  -With start of Letrozole I previously discussed the side effect of weak bone from letrozole   -Will request bone density scan before next visit    PLAN:  -Survivorship clinic in 3 months -Labs and follow-up in 6 months -Bone density scan in the next few months -Repeat CT abdomen with renal protocol in 6 weeks, and I will see her back after the scan      No orders of the defined types were placed in this encounter.   All questions were answered. The patient knows to call the clinic with any problems, questions or concerns.   [name], MD 11/05/2017  This document serves as a record of services personally performed by [name], MD. It was created on her behalf by Steva Colder, a trained medical scribe. The creation of this record is based on the scribe's personal observations and the provider's statements to them.   I have reviewed the above documentation for accuracy and completeness, and I agree with the above.

## 2017-11-05 ENCOUNTER — Inpatient Hospital Stay: Payer: Medicare Other | Attending: Hematology | Admitting: Hematology

## 2017-11-05 ENCOUNTER — Telehealth: Payer: Self-pay | Admitting: Hematology

## 2017-11-05 ENCOUNTER — Inpatient Hospital Stay: Payer: Medicare Other

## 2017-11-05 ENCOUNTER — Ambulatory Visit
Admission: RE | Admit: 2017-11-05 | Discharge: 2017-11-05 | Disposition: A | Discharge status: Home / Self Care | Payer: Medicare Other | Source: Ambulatory Visit | Attending: Hematology | Admitting: Hematology

## 2017-11-05 VITALS — BP 138/84 | HR 94 | Temp 98.2°F | Resp 16 | Ht 62.0 in | Wt 124.9 lb

## 2017-11-05 DIAGNOSIS — Z170 Estrogen receptor positive status [ER+]: Secondary | ICD-10-CM | POA: Diagnosis not present

## 2017-11-05 DIAGNOSIS — C50811 Malignant neoplasm of overlapping sites of right female breast: Secondary | ICD-10-CM | POA: Diagnosis not present

## 2017-11-05 DIAGNOSIS — N281 Cyst of kidney, acquired: Secondary | ICD-10-CM | POA: Diagnosis not present

## 2017-11-05 DIAGNOSIS — L598 Other specified disorders of the skin and subcutaneous tissue related to radiation: Secondary | ICD-10-CM | POA: Diagnosis not present

## 2017-11-05 DIAGNOSIS — C50112 Malignant neoplasm of central portion of left female breast: Secondary | ICD-10-CM

## 2017-11-05 DIAGNOSIS — D6481 Anemia due to antineoplastic chemotherapy: Secondary | ICD-10-CM | POA: Diagnosis not present

## 2017-11-05 DIAGNOSIS — E2839 Other primary ovarian failure: Secondary | ICD-10-CM

## 2017-11-05 DIAGNOSIS — C50411 Malignant neoplasm of upper-outer quadrant of right female breast: Secondary | ICD-10-CM | POA: Diagnosis not present

## 2017-11-05 DIAGNOSIS — Z510 Encounter for antineoplastic radiation therapy: Secondary | ICD-10-CM | POA: Diagnosis not present

## 2017-11-05 LAB — COMPREHENSIVE METABOLIC PANEL
ALT: 26 U/L (ref 0–55)
AST: 32 U/L (ref 5–34)
Albumin: 3.9 g/dL (ref 3.5–5.0)
Alkaline Phosphatase: 100 U/L (ref 40–150)
Anion gap: 8 (ref 3–11)
BUN: 21 mg/dL (ref 7–26)
CO2: 29 mmol/L (ref 22–29)
Calcium: 9.4 mg/dL (ref 8.4–10.4)
Chloride: 106 mmol/L (ref 98–109)
Creatinine, Ser: 1.13 mg/dL — ABNORMAL HIGH (ref 0.60–1.10)
GFR calc Af Amer: 56 mL/min — ABNORMAL LOW (ref >60)
GFR calc non Af Amer: 48 mL/min — ABNORMAL LOW (ref >60)
Glucose, Bld: 170 mg/dL — ABNORMAL HIGH (ref 70–140)
Potassium: 4.4 mmol/L (ref 3.5–5.1)
Sodium: 143 mmol/L (ref 136–145)
Total Bilirubin: <0.2 mg/dL — ABNORMAL LOW (ref 0.2–1.2)
Total Protein: 6.8 g/dL (ref 6.4–8.3)

## 2017-11-05 LAB — CBC WITH DIFFERENTIAL/PLATELET
Basophils Absolute: 0 10*3/uL (ref 0.0–0.1)
Basophils Relative: 1 %
Eosinophils Absolute: 0.3 10*3/uL (ref 0.0–0.5)
Eosinophils Relative: 6 %
HCT: 36.1 % (ref 34.8–46.6)
Hemoglobin: 11.4 g/dL — ABNORMAL LOW (ref 11.6–15.9)
Lymphocytes Relative: 10 %
Lymphs Abs: 0.4 10*3/uL — ABNORMAL LOW (ref 0.9–3.3)
MCH: 29 pg (ref 25.1–34.0)
MCHC: 31.6 g/dL (ref 31.5–36.0)
MCV: 91.9 fL (ref 79.5–101.0)
Monocytes Absolute: 0.6 10*3/uL (ref 0.1–0.9)
Monocytes Relative: 14 %
Neutro Abs: 3.1 10*3/uL (ref 1.5–6.5)
Neutrophils Relative %: 69 %
Platelets: 211 10*3/uL (ref 145–400)
RBC: 3.93 MIL/uL (ref 3.70–5.45)
RDW: 14.8 % — ABNORMAL HIGH (ref 11.2–14.5)
WBC: 4.4 10*3/uL (ref 3.9–10.3)

## 2017-11-05 NOTE — Telephone Encounter (Signed)
Appointments scheduled AVS/Calendar printed per 5/14 los °

## 2017-11-06 ENCOUNTER — Encounter (HOSPITAL_BASED_OUTPATIENT_CLINIC_OR_DEPARTMENT_OTHER): Payer: Self-pay | Admitting: *Deleted

## 2017-11-06 ENCOUNTER — Ambulatory Visit (HOSPITAL_BASED_OUTPATIENT_CLINIC_OR_DEPARTMENT_OTHER): Payer: Medicare Other | Admitting: Anesthesiology

## 2017-11-06 ENCOUNTER — Other Ambulatory Visit: Payer: Self-pay

## 2017-11-06 ENCOUNTER — Telehealth: Payer: Self-pay

## 2017-11-06 ENCOUNTER — Ambulatory Visit (HOSPITAL_BASED_OUTPATIENT_CLINIC_OR_DEPARTMENT_OTHER)
Admission: RE | Admit: 2017-11-06 | Discharge: 2017-11-06 | Disposition: A | Discharge status: Home / Self Care | Payer: Medicare Other | Source: Ambulatory Visit | Attending: Surgery | Admitting: Surgery

## 2017-11-06 ENCOUNTER — Encounter (HOSPITAL_BASED_OUTPATIENT_CLINIC_OR_DEPARTMENT_OTHER)
Admission: RE | Disposition: A | Discharge status: Home / Self Care | Payer: Self-pay | Source: Ambulatory Visit | Attending: Surgery

## 2017-11-06 DIAGNOSIS — Z79811 Long term (current) use of aromatase inhibitors: Secondary | ICD-10-CM | POA: Diagnosis not present

## 2017-11-06 DIAGNOSIS — Z452 Encounter for adjustment and management of vascular access device: Secondary | ICD-10-CM | POA: Insufficient documentation

## 2017-11-06 DIAGNOSIS — E119 Type 2 diabetes mellitus without complications: Secondary | ICD-10-CM | POA: Insufficient documentation

## 2017-11-06 DIAGNOSIS — Z7982 Long term (current) use of aspirin: Secondary | ICD-10-CM | POA: Insufficient documentation

## 2017-11-06 DIAGNOSIS — Z79899 Other long term (current) drug therapy: Secondary | ICD-10-CM | POA: Insufficient documentation

## 2017-11-06 DIAGNOSIS — Z853 Personal history of malignant neoplasm of breast: Secondary | ICD-10-CM | POA: Diagnosis not present

## 2017-11-06 DIAGNOSIS — Z9221 Personal history of antineoplastic chemotherapy: Secondary | ICD-10-CM | POA: Insufficient documentation

## 2017-11-06 DIAGNOSIS — I10 Essential (primary) hypertension: Secondary | ICD-10-CM | POA: Diagnosis not present

## 2017-11-06 DIAGNOSIS — E785 Hyperlipidemia, unspecified: Secondary | ICD-10-CM | POA: Insufficient documentation

## 2017-11-06 DIAGNOSIS — C50411 Malignant neoplasm of upper-outer quadrant of right female breast: Secondary | ICD-10-CM | POA: Diagnosis not present

## 2017-11-06 DIAGNOSIS — Z9011 Acquired absence of right breast and nipple: Secondary | ICD-10-CM | POA: Insufficient documentation

## 2017-11-06 HISTORY — PX: PORT-A-CATH REMOVAL: SHX5289

## 2017-11-06 LAB — CANCER ANTIGEN 27.29: CA 27.29: 36.6 U/mL (ref 0.0–38.6)

## 2017-11-06 SURGERY — REMOVAL PORT-A-CATH
Anesthesia: General | Site: Chest | Laterality: Right

## 2017-11-06 MED ORDER — LACTATED RINGERS IV SOLN
INTRAVENOUS | Status: DC
  Administered 2017-11-06: 08:00:00 via INTRAVENOUS

## 2017-11-06 MED ORDER — PROPOFOL 10 MG/ML IV BOLUS
INTRAVENOUS | Status: DC | PRN
  Administered 2017-11-06: 130 mg via INTRAVENOUS

## 2017-11-06 MED ORDER — ACETAMINOPHEN 500 MG PO TABS
ORAL_TABLET | ORAL | Status: AC
  Filled 2017-11-06: qty 2

## 2017-11-06 MED ORDER — ACETAMINOPHEN 500 MG PO TABS
1000.0000 mg | ORAL_TABLET | ORAL | Status: AC
  Administered 2017-11-06: 1000 mg via ORAL

## 2017-11-06 MED ORDER — ACETAMINOPHEN 325 MG PO TABS: 325.0000 mg | ORAL_TABLET | ORAL | Status: DC | PRN

## 2017-11-06 MED ORDER — MIDAZOLAM HCL 2 MG/2ML IJ SOLN: 1.0000 mg | INTRAMUSCULAR | Status: DC | PRN

## 2017-11-06 MED ORDER — HYDROCODONE-ACETAMINOPHEN 5-325 MG PO TABS: 1.0000 | ORAL_TABLET | Freq: Four times a day (QID) | ORAL | 0 refills | Status: DC | PRN

## 2017-11-06 MED ORDER — OXYCODONE HCL 5 MG PO TABS: 5.0000 mg | ORAL_TABLET | Freq: Once | ORAL | Status: DC | PRN

## 2017-11-06 MED ORDER — ONDANSETRON HCL 4 MG/2ML IJ SOLN
INTRAMUSCULAR | Status: DC | PRN
  Administered 2017-11-06: 4 mg via INTRAVENOUS

## 2017-11-06 MED ORDER — OXYCODONE HCL 5 MG/5ML PO SOLN: 5.0000 mg | Freq: Once | ORAL | Status: DC | PRN

## 2017-11-06 MED ORDER — EPHEDRINE SULFATE 50 MG/ML IJ SOLN
INTRAMUSCULAR | Status: DC | PRN
  Administered 2017-11-06 (×2): 10 mg via INTRAVENOUS

## 2017-11-06 MED ORDER — FENTANYL CITRATE (PF) 100 MCG/2ML IJ SOLN
INTRAMUSCULAR | Status: AC
  Filled 2017-11-06: qty 2

## 2017-11-06 MED ORDER — CHLORHEXIDINE GLUCONATE CLOTH 2 % EX PADS: 6.0000 | MEDICATED_PAD | Freq: Once | CUTANEOUS | Status: DC

## 2017-11-06 MED ORDER — DEXAMETHASONE SODIUM PHOSPHATE 4 MG/ML IJ SOLN
INTRAMUSCULAR | Status: DC | PRN
  Administered 2017-11-06: 10 mg via INTRAVENOUS

## 2017-11-06 MED ORDER — CEFAZOLIN SODIUM-DEXTROSE 2-4 GM/100ML-% IV SOLN
2.0000 g | INTRAVENOUS | Status: AC
  Administered 2017-11-06: 2 g via INTRAVENOUS

## 2017-11-06 MED ORDER — SCOPOLAMINE 1 MG/3DAYS TD PT72
1.0000 | MEDICATED_PATCH | Freq: Once | TRANSDERMAL | Status: AC | PRN
  Administered 2017-11-06: 1 via TRANSDERMAL

## 2017-11-06 MED ORDER — FENTANYL CITRATE (PF) 100 MCG/2ML IJ SOLN
50.0000 ug | INTRAMUSCULAR | Status: DC | PRN
  Administered 2017-11-06: 50 ug via INTRAVENOUS

## 2017-11-06 MED ORDER — BUPIVACAINE-EPINEPHRINE 0.25% -1:200000 IJ SOLN
INTRAMUSCULAR | Status: DC | PRN
  Administered 2017-11-06: 6 mL

## 2017-11-06 MED ORDER — MEPERIDINE HCL 25 MG/ML IJ SOLN: 6.2500 mg | INTRAMUSCULAR | Status: DC | PRN

## 2017-11-06 MED ORDER — CEFAZOLIN SODIUM-DEXTROSE 2-4 GM/100ML-% IV SOLN
INTRAVENOUS | Status: AC
  Filled 2017-11-06: qty 100

## 2017-11-06 MED ORDER — LIDOCAINE HCL (CARDIAC) PF 100 MG/5ML IV SOSY
PREFILLED_SYRINGE | INTRAVENOUS | Status: DC | PRN
  Administered 2017-11-06: 60 mg via INTRAVENOUS

## 2017-11-06 MED ORDER — ACETAMINOPHEN 160 MG/5ML PO SOLN: 325.0000 mg | ORAL | Status: DC | PRN

## 2017-11-06 MED ORDER — FENTANYL CITRATE (PF) 100 MCG/2ML IJ SOLN: 25.0000 ug | INTRAMUSCULAR | Status: DC | PRN

## 2017-11-06 MED ORDER — SCOPOLAMINE 1 MG/3DAYS TD PT72
MEDICATED_PATCH | TRANSDERMAL | Status: AC
  Filled 2017-11-06: qty 1

## 2017-11-06 MED ORDER — ONDANSETRON HCL 4 MG/2ML IJ SOLN: 4.0000 mg | Freq: Once | INTRAMUSCULAR | Status: DC | PRN

## 2017-11-06 SURGICAL SUPPLY — 40 items
APPLICATOR COTTON TIP 6 STRL (MISCELLANEOUS) IMPLANT
APPLICATOR COTTON TIP 6IN STRL (MISCELLANEOUS)
BENZOIN TINCTURE PRP APPL 2/3 (GAUZE/BANDAGES/DRESSINGS) ×2 IMPLANT
BLADE HEX COATED 2.75 (ELECTRODE) ×2 IMPLANT
BLADE SURG 15 STRL LF DISP TIS (BLADE) ×1 IMPLANT
BLADE SURG 15 STRL SS (BLADE) ×1
CANISTER SUCT 1200ML W/VALVE (MISCELLANEOUS) IMPLANT
CHLORAPREP W/TINT 26ML (MISCELLANEOUS) ×2 IMPLANT
COVER BACK TABLE 60X90IN (DRAPES) ×2 IMPLANT
COVER MAYO STAND STRL (DRAPES) ×2 IMPLANT
DECANTER SPIKE VIAL GLASS SM (MISCELLANEOUS) IMPLANT
DRAPE LAPAROTOMY 100X72 PEDS (DRAPES) ×2 IMPLANT
DRAPE UTILITY XL STRL (DRAPES) ×2 IMPLANT
DRSG TEGADERM 4X4.75 (GAUZE/BANDAGES/DRESSINGS) ×2 IMPLANT
ELECT REM PT RETURN 9FT ADLT (ELECTROSURGICAL) ×2
ELECTRODE REM PT RTRN 9FT ADLT (ELECTROSURGICAL) ×1 IMPLANT
GAUZE SPONGE 4X4 12PLY STRL LF (GAUZE/BANDAGES/DRESSINGS) IMPLANT
GLOVE BIO SURGEON STRL SZ7 (GLOVE) ×2 IMPLANT
GLOVE BIOGEL PI IND STRL 7.0 (GLOVE) ×2 IMPLANT
GLOVE BIOGEL PI IND STRL 7.5 (GLOVE) ×1 IMPLANT
GLOVE BIOGEL PI INDICATOR 7.0 (GLOVE) ×2
GLOVE BIOGEL PI INDICATOR 7.5 (GLOVE) ×1
GLOVE ECLIPSE 6.5 STRL STRAW (GLOVE) ×2 IMPLANT
GOWN STRL REUS W/ TWL LRG LVL3 (GOWN DISPOSABLE) ×1 IMPLANT
GOWN STRL REUS W/TWL LRG LVL3 (GOWN DISPOSABLE) ×1
NEEDLE HYPO 25X1 1.5 SAFETY (NEEDLE) ×2 IMPLANT
NS IRRIG 1000ML POUR BTL (IV SOLUTION) IMPLANT
PACK BASIN DAY SURGERY FS (CUSTOM PROCEDURE TRAY) ×2 IMPLANT
PENCIL BUTTON HOLSTER BLD 10FT (ELECTRODE) ×2 IMPLANT
SPONGE GAUZE 2X2 8PLY STRL LF (GAUZE/BANDAGES/DRESSINGS) IMPLANT
SPONGE LAP 4X18 RFD (DISPOSABLE) ×2 IMPLANT
STRIP CLOSURE SKIN 1/2X4 (GAUZE/BANDAGES/DRESSINGS) ×2 IMPLANT
SUT MON AB 4-0 PC3 18 (SUTURE) ×2 IMPLANT
SUT VIC AB 3-0 SH 27 (SUTURE) ×1
SUT VIC AB 3-0 SH 27X BRD (SUTURE) ×1 IMPLANT
SYR CONTROL 10ML LL (SYRINGE) ×2 IMPLANT
TOWEL OR 17X24 6PK STRL BLUE (TOWEL DISPOSABLE) ×2 IMPLANT
TOWEL OR NON WOVEN STRL DISP B (DISPOSABLE) IMPLANT
TUBE CONNECTING 20X1/4 (TUBING) IMPLANT
YANKAUER SUCT BULB TIP NO VENT (SUCTIONS) IMPLANT

## 2017-11-06 NOTE — Discharge Instructions (Signed)
POST OP INSTRUCTIONS  Always review your discharge instruction sheet given to you by the facility where your surgery was performed.   1. A prescription for pain medication may be given to you upon discharge. Take your pain medication as prescribed, if needed. If narcotic pain medicine is not needed, then you make take acetaminophen (Tylenol) or ibuprofen (Advil) as needed. No Tylenol until 2:00pm! 2. Take your usually prescribed medications unless otherwise directed. 3. If you need a refill on your pain medication, please contact our office. All narcotic pain medicine now requires a paper prescription.  Phoned in and fax refills are no longer allowed by law.  Prescriptions will not be filled after 5 pm or on weekends.  4. You should follow a light diet for the remainder of the day after your procedure. 5. Most patients will experience some mild swelling and/or bruising in the area of the incision. It may take several days to resolve. 6. It is common to experience some constipation if taking pain medication after surgery. Increasing fluid intake and taking a stool softener (such as Colace) will usually help or prevent this problem from occurring. A mild laxative (Milk of Magnesia or Miralax) should be taken according to package directions if there are no bowel movements after 48 hours.  7. Unless discharge instructions indicate otherwise, you may remove your bandages 48 hours after surgery, and you may shower at that time. You may have steri-strips (small white skin tapes) in place directly over the incision.  These strips should be left on the skin for 7-10 days.  8. ACTIVITIES:  Limit activity involving your arms for the next 72 hours. Do no strenuous exercise or activity for 1 week. You may drive when you are no longer taking prescription pain medication, you can comfortably wear a seatbelt, and you can maneuver your car. 9.  You may need to see your doctor in the office for a follow-up appointment.   Please       check with your doctor.    WHEN TO CALL YOUR DOCTOR [phone]): 1. Fever over 101.0 2. Chills 3. Continued bleeding from incision 4. Increased redness and tenderness at the site 5. Shortness of breath, difficulty breathing   The clinic staff is available to answer your questions during regular business hours. Please dont hesitate to call and ask to speak to one of the nurses or medical assistants for clinical concerns. If you have a medical emergency, go to the nearest emergency room or call 911.  A surgeon from [facility] Surgery is always on call at the hospital.     For further information, please visit [email or web address]     Post Anesthesia Home Care Instructions  Activity: Get plenty of rest for the remainder of the day. A responsible individual must stay with you for 24 hours following the procedure.  For the next 24 hours, DO NOT: -Drive a car -Paediatric nurse -Drink alcoholic beverages -Take any medication unless instructed by your physician -Make any legal decisions or sign important papers.  Meals: Start with liquid foods such as gelatin or soup. Progress to regular foods as tolerated. Avoid greasy, spicy, heavy foods. If nausea and/or vomiting occur, drink only clear liquids until the nausea and/or vomiting subsides. Call your physician if vomiting continues.  Special Instructions/Symptoms: Your throat may feel dry or sore from the anesthesia or the breathing tube placed in your throat during surgery. If this causes discomfort, gargle with warm salt water. The discomfort should disappear within 24  hours.  If you had a scopolamine patch placed behind your ear for the management of post- operative nausea and/or vomiting:  1. The medication in the patch is effective for 72 hours, after which it should be removed.  Wrap patch in a tissue and discard in the trash. Wash hands thoroughly with soap and water. 2. You may remove the  patch earlier than 72 hours if you experience unpleasant side effects which may include dry mouth, dizziness or visual disturbances. 3. Avoid touching the patch. Wash your hands with soap and water after contact with the patch.

## 2017-11-06 NOTE — H&P (Signed)
Madison Stokes is an 72 y.o. female.   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  She has completed her chemotherapy and Oncology has asked [place] to remove the port.  Past Medical History:  Diagnosis Date  . Anemia    with chemo  . Cancer ([employer]) 04/2017   Bil Breast Ca  . Hyperlipidemia   . Hypertension   . PONV (postoperative nausea and vomiting)    nausea only    Past Surgical History:  Procedure Laterality Date  . BREAST LUMPECTOMY WITH RADIOACTIVE SEED AND SENTINEL LYMPH NODE BIOPSY Left 08/22/2017   Procedure: LEFT BREAST LUMPECTOMY WITH RADIOACTIVE SEED AND SENTINEL LYMPH NODE BIOPSY ERAS PATHWAY;  Surgeon: [name], MD;  Location: [facility];  Service: General;  Laterality: Left;  PECTORAL BLOCK  . COLONOSCOPY WITH PROPOFOL Left 07/14/2017   Procedure: COLONOSCOPY WITH PROPOFOL;  Surgeon: Ronnette Juniper, MD;  Location: [facility];  Service: Gastroenterology;  Laterality: Left;  . DILATION AND CURETTAGE OF UTERUS    . ESOPHAGOGASTRODUODENOSCOPY N/A 07/13/2017   Procedure: ESOPHAGOGASTRODUODENOSCOPY (EGD);  Surgeon: Ronnette Juniper, MD;  Location: [facility];  Service: Gastroenterology;  Laterality: N/A;  . MASTECTOMY W/ SENTINEL NODE BIOPSY Right 08/22/2017   Procedure: RIGHT MASTECTOMY WITH SENTINEL LYMPH NODE BIOPSY ERAS PATHWAY;  Surgeon: [name], MD;  Location: [facility];  Service: General;  Laterality: Right;  PECTORAL BLOCK  . PORTACATH PLACEMENT Right 05/14/2017   Procedure: ULTRASOUND GUIDED PORT PLACEMENT;  Surgeon: [name], MD;  Location: [facility];  Service: General;  Laterality: Right;    Family History  Problem Relation Age of Onset  . Cancer Mother 20       uterine  . Cancer Brother        prostate  .  Heart disease Father   . Heart attack Father   . Diabetes Father   . Hypertension Father    Social History:  reports that she has never smoked. She has never used smokeless tobacco. She reports that she drinks alcohol. She reports that she does not use drugs.  Allergies: No Known Allergies  Medications Prior to Admission  Medication Sig Dispense Refill  . aspirin EC 81 MG tablet Take 81 mg by mouth daily.    . irbesartan (AVAPRO) 150 MG tablet Take 150 mg by mouth daily.     Marland Kitchen letrozole (FEMARA) 2.5 MG tablet Take 1 tablet (2.5 mg total) by mouth daily. 90 tablet 3  . loperamide (IMODIUM) 2 MG capsule Take 2-4 mg by mouth 4 (four) times daily as needed for diarrhea or loose stools.     . Multiple Vitamin (MULTIVITAMIN) tablet Take 1 tablet daily by mouth.    . prochlorperazine (COMPAZINE) 10 MG tablet Take 1 tablet (10 mg total) every 6 (six) hours as needed by mouth (Nausea or vomiting). 30 tablet 1  . simvastatin (ZOCOR) 40 MG tablet Take 40 mg at bedtime by mouth.    Marland Kitchen VITAMIN B COMPLEX-C PO Take by mouth.    . loratadine (CLARITIN) 10 MG tablet Take 10 mg by mouth daily as needed for allergies.      Results for orders placed or performed in visit on 11/05/17 (from the past 48 hour(s))  CA 27.29     Status: None   Collection Time:  11/05/17 12:39 PM  Result Value Ref Range   CA 27.29 36.6 0.0 - 38.6 U/mL    Comment: (NOTE) Siemens Centaur Immunochemiluminometric Methodology [facility]) Values obtained with different assay methods or kits cannot be used interchangeably. Results cannot be interpreted as absolute evidence of the presence or absence of malignant disease. Performed At: [facility] [place], [place] [id number] Rush Farmer MD NW:[id number] Performed at [facility], [place] [address] [place], [place] [address]   Comprehensive metabolic panel     Status: Abnormal   Collection Time: 11/05/17 12:39 PM  Result Value Ref Range    Sodium 143 136 - 145 mmol/L   Potassium 4.4 3.5 - 5.1 mmol/L   Chloride 106 98 - 109 mmol/L   CO2 29 22 - 29 mmol/L   Glucose, Bld 170 (H) 70 - 140 mg/dL   BUN 21 7 - 26 mg/dL   Creatinine, Ser 1.13 (H) 0.60 - 1.10 mg/dL   Calcium 9.4 8.4 - 10.4 mg/dL   Total Protein 6.8 6.4 - 8.3 g/dL   Albumin 3.9 3.5 - 5.0 g/dL   AST 32 5 - 34 U/L   ALT 26 0 - 55 U/L   Alkaline Phosphatase 100 40 - 150 U/L   Total Bilirubin <0.2 (L) 0.2 - 1.2 mg/dL   GFR calc non Af Amer 48 (L) >60 mL/min   GFR calc Af Amer 56 (L) >60 mL/min    Comment: (NOTE) The eGFR has been calculated using the CKD EPI equation. This calculation has not been validated in all clinical situations. eGFR's persistently <60 mL/min signify possible Chronic Kidney Disease.    Anion gap 8 3 - 11    Comment: Performed at [facility], [address], [place], [place] [address]  CBC with Differential     Status: Abnormal   Collection Time: 11/05/17 12:39 PM  Result Value Ref Range   WBC 4.4 3.9 - 10.3 K/uL   RBC 3.93 3.70 - 5.45 MIL/uL   Hemoglobin 11.4 (L) 11.6 - 15.9 g/dL   HCT 36.1 34.8 - 46.6 %   MCV 91.9 79.5 - 101.0 fL   MCH 29.0 25.1 - 34.0 pg   MCHC 31.6 31.5 - 36.0 g/dL   RDW 14.8 (H) 11.2 - 14.5 %   Platelets 211 145 - 400 K/uL   Neutrophils Relative % 69 %   Neutro Abs 3.1 1.5 - 6.5 K/uL   Lymphocytes Relative 10 %   Lymphs Abs 0.4 (L) 0.9 - 3.3 K/uL   Monocytes Relative 14 %   Monocytes Absolute 0.6 0.1 - 0.9 K/uL   Eosinophils Relative 6 %   Eosinophils Absolute 0.3 0.0 - 0.5 K/uL   Basophils Relative 1 %   Basophils Absolute 0.0 0.0 - 0.1 K/uL    Comment: Performed at [facility], [place] [address] [place], [place] [address]   No results found.  Review of Systems  Constitutional: Negative for weight loss.  HENT: Negative for ear discharge, ear pain, hearing loss and tinnitus.   Eyes: Negative for blurred vision, double vision, photophobia and pain.   Respiratory: Negative for cough, sputum production and shortness of breath.   Cardiovascular: Negative for chest pain.  Gastrointestinal: Negative for abdominal pain, nausea and vomiting.  Genitourinary: Negative for dysuria, flank pain, frequency and urgency.  Musculoskeletal: Negative for back pain, falls, joint pain, myalgias and neck pain.  Neurological: Negative for dizziness, tingling, sensory change, focal weakness,  loss of consciousness and headaches.  Endo/Heme/Allergies: Does not bruise/bleed easily.  Psychiatric/Behavioral: Negative for depression, memory loss and substance abuse. The patient is not nervous/anxious.     Blood pressure 133/80, pulse 85, temperature 98.1 F (36.7 C), temperature source Oral, resp. rate 20, height _0  (1.575 m), weight 55.8 kg (123 lb), SpO2 100 %. Physical Exam  WDWN in NAD Right chest - healed mastectomy site; port in place Left - well-healed lumpectomy incision.  Assessment/Plan Port removal under anesthesia.  Imogene Burn. Georgette Dover, MD, [facility] Surgery  General/ Trauma Surgery  11/06/2017 8:00 AM

## 2017-11-06 NOTE — Anesthesia Postprocedure Evaluation (Signed)
Anesthesia Post Note  Patient: Lanay Zinda  Procedure(s) Performed: REMOVAL PORT-A-CATH (Right Chest)     Patient location during evaluation: PACU Anesthesia Type: General Level of consciousness: awake and alert Pain management: pain level controlled Vital Signs Assessment: post-procedure vital signs reviewed and stable Respiratory status: spontaneous breathing, nonlabored ventilation, respiratory function stable and patient connected to nasal cannula oxygen Cardiovascular status: blood pressure returned to baseline and stable Postop Assessment: no apparent nausea or vomiting Anesthetic complications: no    Last Vitals:  Vitals:   11/06/17 0930 11/06/17 0952  BP: 113/81 128/76  Pulse: 95 93  Resp: 15 16  Temp:  36.5 C  SpO2: 96% 97%    Last Pain:  Vitals:   11/06/17 0952  TempSrc: Oral  PainSc: 0-No pain                 Yousef Huge

## 2017-11-06 NOTE — Telephone Encounter (Signed)
-----   Message from [name], MD sent at 11/06/2017 10:15 AM EDT ----- Please let pt know that she has a kidnesy cyst which needs to be followed. She also needs a bone density scan if she has not had one in the past 2 years.  I will schedule a CT abdomen and DEXA to be done in 6 weeks and see her after the scans. I forgot to discuss these with her during her visit yesterday,please let her know and anticipate breast center and [facility] radiology will call her to schedule.   Thanks  Krista Blue

## 2017-11-06 NOTE — Anesthesia Procedure Notes (Signed)
Procedure Name: LMA Insertion Date/Time: 11/06/2017 8:35 AM Performed by: Signe Colt, CRNA Pre-anesthesia Checklist: Patient identified, Emergency Drugs available, Suction available and Patient being monitored Patient Re-evaluated:Patient Re-evaluated prior to induction Oxygen Delivery Method: Circle system utilized Preoxygenation: Pre-oxygenation with 100% oxygen Induction Type: IV induction Ventilation: Mask ventilation without difficulty LMA: LMA inserted LMA Size: 4.0 Number of attempts: 1 Airway Equipment and Method: Bite block Placement Confirmation: positive ETCO2 Tube secured with: Tape Dental Injury: Teeth and Oropharynx as per pre-operative assessment

## 2017-11-06 NOTE — Transfer of Care (Signed)
Immediate Anesthesia Transfer of Care Note  Patient: Madison Stokes  Procedure(s) Performed: REMOVAL PORT-A-CATH (Right Chest)  Patient Location: PACU  Anesthesia Type:General  Level of Consciousness: awake and patient cooperative  Airway & Oxygen Therapy: Patient Spontanous Breathing and Patient connected to face mask oxygen  Post-op Assessment: Report given to RN and Post -op Vital signs reviewed and stable  Post vital signs: Reviewed and stable  Last Vitals:  Vitals Value Taken Time  BP    Temp    Pulse 89 11/06/2017  9:01 AM  Resp    SpO2 96 % 11/06/2017  9:01 AM  Vitals shown include unvalidated device data.  Last Pain:  Vitals:   11/06/17 0744  TempSrc: Oral  PainSc: 0-No pain         Complications: No apparent anesthesia complications

## 2017-11-06 NOTE — Op Note (Signed)
Preop diagnosis: Bilateral breast cancer Postop diagnosis: Same Procedure performed: Port removal Surgeon:Prosperity Darrough K Jermiyah Ricotta Anesthesia: General LMA Indications: This is a 72 year old female status post right mastectomy for a large lobular invasive carcinoma and status post a left lumpectomy for a small invasive ductal carcinoma.  She has completed her chemotherapy.  She presents now for port removal.  She is currently undergoing radiation therapy.  Description of procedure: The patient is brought to the operating room and placed in the supine position on the operating room table.  She has obvious radiation changes to the skin of her right chest and axilla.  Her port is above the area of erythematous skin.  We prepped this area with ChloraPrep and draped in sterile fashion.  A timeout was taken to ensure the proper patient and proper procedure.  We infiltrated the area around the incision with 0.25% Marcaine with epinephrine.  A transverse incision was made.  Dissection was carried down to the port.  We located the hub of the port and grasped this with a hemostat.  The catheter was removed from the internal jugular vein.  Pressure was held for several minutes.  No hematoma or bleeding was noted.  We remove the 2 stay sutures holding the port in place.  The entire port was removed.  We excised part of the fibrin sheath around the port.  We inspected for hemostasis.  The wound was closed with 3-0 Vicryl and 4-0 Monocryl.  Benzoin Steri-Strips were applied.  The patient was then extubated and brought to the recovery room in stable condition.  All sponge, instrument, and needle counts are correct.  Imogene Burn. Georgette Dover, MD, [facility] Surgery  General/ Trauma Surgery  11/06/2017 9:02 AM

## 2017-11-06 NOTE — Anesthesia Preprocedure Evaluation (Signed)
Anesthesia Evaluation  Patient identified by MRN, date of birth, ID band Patient awake    Reviewed: Allergy & Precautions, NPO status , Patient's Chart, lab work & pertinent test results  History of Anesthesia Complications (+) PONV and history of anesthetic complications  Airway Mallampati: III  TM Distance: >3 FB Neck ROM: Full    Dental no notable dental hx.    Pulmonary neg pulmonary ROS,    Pulmonary exam normal breath sounds clear to auscultation       Cardiovascular hypertension, Pt. on medications Normal cardiovascular exam Rhythm:Regular Rate:Normal  ECG: ST, rate 100  ECHO: - LVEF 60-65%, normal wall thickness, normal wall motion and GLS strain, grade 1 DD, normal LV filling pressure, mild MR, normal LA size, normal IVC.      Neuro/Psych negative neurological ROS  negative psych ROS   GI/Hepatic negative GI ROS, Neg liver ROS,   Endo/Other  diabetes, Type 2  Renal/GU negative Renal ROS     Musculoskeletal negative musculoskeletal ROS (+)   Abdominal   Peds  Hematology  (+) anemia , HLD   Anesthesia Other Findings Right invasive lobular carcinoma Left invasive ductal carcinoma    Reproductive/Obstetrics                             Anesthesia Physical  Anesthesia Plan  ASA: III  Anesthesia Plan: General   Post-op Pain Management:    Induction: Intravenous  PONV Risk Score and Plan: 4 or greater and Ondansetron, Dexamethasone, Midazolam, Scopolamine patch - Pre-op and Treatment may vary due to age or medical condition  Airway Management Planned: LMA  Additional Equipment:   Intra-op Plan:   Post-operative Plan: Extubation in OR  Informed Consent: I have reviewed the patients History and Physical, chart, labs and discussed the procedure including the risks, benefits and alternatives for the proposed anesthesia with the patient or authorized representative who  has indicated his/her understanding and acceptance.   Dental advisory given  Plan Discussed with: CRNA, Anesthesiologist and Surgeon  Anesthesia Plan Comments:         Anesthesia Quick Evaluation

## 2017-11-06 NOTE — Telephone Encounter (Signed)
Called patient left voice message on home phone kidney cyst that needs to be followed, also she needs a bone density scan.   Dr. Burr Medico will scheduled CT abdomen and DEXA to done in 6 weeks. We will see her after the scans to discuss.

## 2017-11-07 ENCOUNTER — Other Ambulatory Visit: Payer: Medicare Other

## 2017-11-07 ENCOUNTER — Telehealth: Payer: Self-pay | Admitting: Hematology

## 2017-11-07 ENCOUNTER — Encounter (HOSPITAL_BASED_OUTPATIENT_CLINIC_OR_DEPARTMENT_OTHER): Payer: Self-pay | Admitting: Surgery

## 2017-11-07 ENCOUNTER — Ambulatory Visit: Payer: Medicare Other

## 2017-11-07 NOTE — Telephone Encounter (Signed)
Appointments scheduled letter/calendar mailed to patient per 5/15 sch msg °

## 2017-11-12 ENCOUNTER — Encounter: Payer: Self-pay | Admitting: Radiation Oncology

## 2017-11-12 NOTE — Progress Notes (Signed)
  Radiation Oncology         [phone] ________________________________  Name: Madison Stokes MRN: [id number]  Date: 11/12/2017  DOB: Mar 30, 1946  End of Treatment Note  Diagnosis: Clinical Stage IB, T3 N0 M0, Grade 2-3 Right Breast UOQ-LOQ Invasive Lobular Carcinoma, ER 100% / PR 100% / Her2(+), ypT3N0(i+) Her2 negative post-chemo  And  Clinical Stage IA, T1a N0 M0, Grade 1-2 Left Breast Central-LOQ Invasive Ductal Carcinoma, ER 50%/ PR 90% / Her2(-), ypT0N0    Indication for treatment: Curative       Radiation treatment dates: 09/25/17-11/05/17  Site/dose: 1) Left breast/ 50 Gy in 25 fractions 2) Right Chest wall/ 50 Gy in 25 fractions 3) Right SCLV-PAB/ 45 Gy in 25 fractions 4) Right chest wall boost/ 10 Gy in 5 fractions  Beams/energy: 1) 3D/ 10X, 6X 2) 3D/ 10X, 6X 3) 3D/ 10X, 6X 4) Clinical electron/ 6E  Narrative: The patient tolerated radiation treatment relatively well. The patient had bright erythema with small patches of dry peeling to the right chest. Mild erythema noted in the left breast. The patient noted soreness and tenderness to the right chest but denied pain.  Plan: The patient has completed radiation treatment. The patient will return to radiation oncology clinic for routine followup in one month. I advised them to call or return sooner if they have any questions or concerns related to their recovery or treatment.  -----------------------------------  Eppie Gibson, MD  This document serves as a record of services personally performed by Eppie Gibson, MD. It was created on her behalf by Bethann Humble, a trained medical scribe. The creation of this record is based on the scribe's personal observations and the provider's statements to them. This document has been checked and approved by the attending provider.

## 2017-11-13 ENCOUNTER — Telehealth: Payer: Self-pay | Admitting: Hematology

## 2017-11-13 ENCOUNTER — Other Ambulatory Visit: Payer: Self-pay | Admitting: *Deleted

## 2017-11-13 DIAGNOSIS — N281 Cyst of kidney, acquired: Secondary | ICD-10-CM

## 2017-11-13 NOTE — Telephone Encounter (Signed)
LVM in reference to upcoming scan appointment, left my call back number with any questions

## 2017-11-28 ENCOUNTER — Other Ambulatory Visit: Payer: Medicare Other

## 2017-11-28 ENCOUNTER — Ambulatory Visit: Payer: Medicare Other

## 2017-11-28 ENCOUNTER — Encounter: Payer: Self-pay | Admitting: Radiation Oncology

## 2017-11-28 NOTE — Progress Notes (Signed)
Ms. Barrero presents for follow up of radiation completed 11/05/17 to her Left and Right Breast. She saw Dr. Burr Medico on 11/05/17 and she plans:  -Survivorship clinic in 3 months (scheduled for 02/05/18) -Labs and follow-up in 6 months (scheduled for 12/20/17) -Bone density scan in the next few months (scheduled for 12/18/17) -Repeat CT abdomen with renal protocol in 6 weeks, and I will see her back after the scan  (CT scheduled for 12/17/17).  She is taking Letrozole daily as directed by Dr. Burr Medico. Her skin has healed. She has hyperpigmentation to her Right Chest. Her Left Breast appears normal. She is applying radiaplex once daily to these areas. She was advised to use a vitamin E containing lotion when she completes her radiaplex.   BP (!) 149/89 (BP Location: Left Arm, Patient Position: Sitting, Cuff Size: Normal)   Pulse 76   Temp 97.9 F (36.6 C) (Oral)   Resp 20   Ht 5\' 2"  (1.575 m)   Wt 123 lb 6.4 oz (56 kg)   SpO2 96%   BMI 22.57 kg/m    Wt Readings from Last 3 Encounters:  12/04/17 123 lb 6.4 oz (56 kg)  11/06/17 123 lb (55.8 kg)  11/05/17 124 lb 14.4 oz (56.7 kg)

## 2017-12-04 ENCOUNTER — Other Ambulatory Visit: Payer: Self-pay

## 2017-12-04 ENCOUNTER — Encounter: Payer: Self-pay | Admitting: Radiation Oncology

## 2017-12-04 ENCOUNTER — Ambulatory Visit
Admission: RE | Admit: 2017-12-04 | Discharge: 2017-12-04 | Disposition: A | Discharge status: Home / Self Care | Payer: Medicare Other | Source: Ambulatory Visit | Attending: Radiation Oncology | Admitting: Radiation Oncology

## 2017-12-04 VITALS — BP 149/89 | HR 76 | Temp 97.9°F | Resp 20 | Ht 62.0 in | Wt 123.4 lb

## 2017-12-04 DIAGNOSIS — C50811 Malignant neoplasm of overlapping sites of right female breast: Secondary | ICD-10-CM | POA: Diagnosis present

## 2017-12-04 DIAGNOSIS — Z7982 Long term (current) use of aspirin: Secondary | ICD-10-CM | POA: Insufficient documentation

## 2017-12-04 DIAGNOSIS — Z170 Estrogen receptor positive status [ER+]: Secondary | ICD-10-CM

## 2017-12-04 DIAGNOSIS — Z08 Encounter for follow-up examination after completed treatment for malignant neoplasm: Secondary | ICD-10-CM | POA: Insufficient documentation

## 2017-12-04 DIAGNOSIS — C50112 Malignant neoplasm of central portion of left female breast: Secondary | ICD-10-CM | POA: Diagnosis present

## 2017-12-04 DIAGNOSIS — Z79899 Other long term (current) drug therapy: Secondary | ICD-10-CM | POA: Diagnosis not present

## 2017-12-04 DIAGNOSIS — Z853 Personal history of malignant neoplasm of breast: Secondary | ICD-10-CM | POA: Insufficient documentation

## 2017-12-04 DIAGNOSIS — Z79811 Long term (current) use of aromatase inhibitors: Secondary | ICD-10-CM | POA: Insufficient documentation

## 2017-12-04 HISTORY — DX: Personal history of irradiation: Z92.3

## 2017-12-04 NOTE — Progress Notes (Signed)
Radiation Oncology         [phone] ________________________________  Name: Madison Stokes MRN: [id number]  Date: 12/04/2017  DOB: Dec 28, 1945  Follow-Up Visit Note  Outpatient  CC: [name], MD  [name], MD  Diagnosis and Prior Radiotherapy:    ICD-10-CM   1. 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 Left breast / 50 Gy in 25 fractions 2. Right breast / 50 Gy in 25 fractions 3. Right SCLV / 45 Gy in 25 fractions 4. Right breast boost / 10 Gy in 5 fractions  CHIEF COMPLAINT: Here for follow-up and surveillance of bilateral breast cancer  Narrative:  The patient returns today for routine follow-up of radiation completed 1 month ago to her bilateral breasts. She is taking Letrozole daily as directed by Dr. Burr Medico. She is scheduled for Survivorship clinic in August. She reports her skin has healed well from radiotherapy. She is applying Radiaplex once daily to her skin. She is leaving tomorrow for a cruise with her family.                         ALLERGIES:  has No Known Allergies.  Meds: Current Outpatient Medications  Medication Sig Dispense Refill  . aspirin EC 81 MG tablet Take 81 mg by mouth daily.    . Biotin 10 MG CAPS Take by mouth.    . irbesartan (AVAPRO) 150 MG tablet Take 150 mg by mouth daily.     Marland Kitchen letrozole (FEMARA) 2.5 MG tablet Take 1 tablet (2.5 mg total) by mouth daily. 90 tablet 3  . loperamide (IMODIUM) 2 MG capsule Take 2-4 mg by mouth 4 (four) times daily as needed  for diarrhea or loose stools.     Marland Kitchen loratadine (CLARITIN) 10 MG tablet Take 10 mg by mouth daily as needed for allergies.    . Multiple Vitamin (MULTIVITAMIN) tablet Take 1 tablet daily by mouth.    . simvastatin (ZOCOR) 40 MG tablet Take 40 mg at bedtime by mouth.    Marland Kitchen VITAMIN B COMPLEX-C PO Take by mouth.    Marland Kitchen HYDROcodone-acetaminophen (NORCO/VICODIN) 5-325 MG tablet Take 1 tablet by mouth every 6 (six) hours as needed for moderate pain. (Patient not taking: Reported on 12/04/2017) 12 tablet 0  . prochlorperazine (COMPAZINE) 10 MG tablet Take 1 tablet (10 mg total) every 6 (six) hours as needed by mouth (Nausea or vomiting). (Patient not taking: Reported on 12/04/2017) 30 tablet 1   No current facility-administered medications for this encounter.     Physical Findings: The patient is in no acute distress. Patient is alert and oriented.  height is '5\' 2"'  (1.575 m) and weight is 123 lb 6.4 oz (56 kg). Her oral temperature is 97.9 F (36.6 C). Her blood pressure is 149/89 (abnormal) and her pulse is 76. Her respiration is 20 and oxygen saturation is 96%.   Her skin has healed well, and she has some residual erythema that is mild on the right surface of  her chest, but the skin is totally intact.   Lab Findings: Lab Results  Component Value Date   WBC 4.4 11/05/2017   HGB 11.4 (L) 11/05/2017   HCT 36.1 11/05/2017   MCV 91.9 11/05/2017   PLT 211 11/05/2017    Radiographic Findings: No results found.  Impression/Plan: Healing well from radiotherapy to the breast tissue.  Continue skin care with topical Vitamin E Oil and / or lotion for at least 2 more months for further healing. Advised to apply at least SPF 25 sunscreen when exposed to the sun.  I encouraged her to continue with yearly mammography as appropriate (for intact breast tissue) and followup with medical oncology. I will see her back on an as-needed basis. I have encouraged her to call if she has any issues or concerns in the  future. I wished her the very best.    _____________________________________   Eppie Gibson, MD  This document serves as a record of services personally performed by Eppie Gibson, MD. It was created on her behalf by Rae Lips, a trained medical scribe. The creation of this record is based on the scribe's personal observations and the provider's statements to them. This document has been checked and approved by the attending provider.

## 2017-12-16 ENCOUNTER — Other Ambulatory Visit: Payer: Medicare Other

## 2017-12-17 ENCOUNTER — Other Ambulatory Visit: Payer: Self-pay

## 2017-12-17 ENCOUNTER — Encounter (HOSPITAL_COMMUNITY): Payer: Self-pay

## 2017-12-17 ENCOUNTER — Ambulatory Visit (HOSPITAL_COMMUNITY)
Admission: RE | Admit: 2017-12-17 | Discharge: 2017-12-17 | Disposition: A | Discharge status: Home / Self Care | Payer: Medicare Other | Source: Ambulatory Visit | Attending: Hematology | Admitting: Hematology

## 2017-12-17 ENCOUNTER — Inpatient Hospital Stay: Payer: Medicare Other | Attending: Hematology

## 2017-12-17 ENCOUNTER — Other Ambulatory Visit: Payer: Self-pay | Admitting: Hematology

## 2017-12-17 DIAGNOSIS — C50811 Malignant neoplasm of overlapping sites of right female breast: Secondary | ICD-10-CM

## 2017-12-17 DIAGNOSIS — C50112 Malignant neoplasm of central portion of left female breast: Secondary | ICD-10-CM | POA: Diagnosis not present

## 2017-12-17 DIAGNOSIS — K7689 Other specified diseases of liver: Secondary | ICD-10-CM | POA: Diagnosis not present

## 2017-12-17 DIAGNOSIS — N281 Cyst of kidney, acquired: Secondary | ICD-10-CM | POA: Insufficient documentation

## 2017-12-17 DIAGNOSIS — Z923 Personal history of irradiation: Secondary | ICD-10-CM | POA: Diagnosis not present

## 2017-12-17 DIAGNOSIS — D259 Leiomyoma of uterus, unspecified: Secondary | ICD-10-CM | POA: Diagnosis not present

## 2017-12-17 DIAGNOSIS — Z9221 Personal history of antineoplastic chemotherapy: Secondary | ICD-10-CM | POA: Insufficient documentation

## 2017-12-17 DIAGNOSIS — N200 Calculus of kidney: Secondary | ICD-10-CM | POA: Diagnosis not present

## 2017-12-17 DIAGNOSIS — Z170 Estrogen receptor positive status [ER+]: Secondary | ICD-10-CM | POA: Insufficient documentation

## 2017-12-17 DIAGNOSIS — Z9011 Acquired absence of right breast and nipple: Secondary | ICD-10-CM | POA: Diagnosis not present

## 2017-12-17 DIAGNOSIS — N951 Menopausal and female climacteric states: Secondary | ICD-10-CM | POA: Diagnosis not present

## 2017-12-17 DIAGNOSIS — R634 Abnormal weight loss: Secondary | ICD-10-CM

## 2017-12-17 DIAGNOSIS — Z8049 Family history of malignant neoplasm of other genital organs: Secondary | ICD-10-CM | POA: Diagnosis not present

## 2017-12-17 DIAGNOSIS — Z79811 Long term (current) use of aromatase inhibitors: Secondary | ICD-10-CM | POA: Diagnosis not present

## 2017-12-17 LAB — CBC WITH DIFFERENTIAL/PLATELET
Basophils Absolute: 0.1 10*3/uL (ref 0.0–0.1)
Basophils Relative: 2 %
Eosinophils Absolute: 0.2 10*3/uL (ref 0.0–0.5)
Eosinophils Relative: 7 %
HCT: 39.8 % (ref 34.8–46.6)
Hemoglobin: 13 g/dL (ref 11.6–15.9)
Lymphocytes Relative: 21 %
Lymphs Abs: 0.7 10*3/uL — ABNORMAL LOW (ref 0.9–3.3)
MCH: 28 pg (ref 25.1–34.0)
MCHC: 32.7 g/dL (ref 31.5–36.0)
MCV: 85.7 fL (ref 79.5–101.0)
Monocytes Absolute: 0.6 10*3/uL (ref 0.1–0.9)
Monocytes Relative: 19 %
Neutro Abs: 1.8 10*3/uL (ref 1.5–6.5)
Neutrophils Relative %: 51 %
Platelets: 216 10*3/uL (ref 145–400)
RBC: 4.64 MIL/uL (ref 3.70–5.45)
RDW: 17.4 % — ABNORMAL HIGH (ref 11.2–14.5)
WBC: 3.4 10*3/uL — ABNORMAL LOW (ref 3.9–10.3)

## 2017-12-17 LAB — COMPREHENSIVE METABOLIC PANEL
ALT: 36 U/L (ref 0–44)
AST: 37 U/L (ref 15–41)
Albumin: 4.2 g/dL (ref 3.5–5.0)
Alkaline Phosphatase: 101 U/L (ref 38–126)
Anion gap: 10 (ref 5–15)
BUN: 19 mg/dL (ref 8–23)
CO2: 28 mmol/L (ref 22–32)
Calcium: 9.7 mg/dL (ref 8.9–10.3)
Chloride: 104 mmol/L (ref 98–111)
Creatinine, Ser: 0.93 mg/dL (ref 0.44–1.00)
GFR calc Af Amer: >60 mL/min (ref >60)
GFR calc non Af Amer: >60 mL/min (ref >60)
Glucose, Bld: 112 mg/dL — ABNORMAL HIGH (ref 70–99)
Potassium: 4.3 mmol/L (ref 3.5–5.1)
Sodium: 142 mmol/L (ref 135–145)
Total Bilirubin: 0.4 mg/dL (ref 0.3–1.2)
Total Protein: 7 g/dL (ref 6.5–8.1)

## 2017-12-17 MED ORDER — IOPAMIDOL (ISOVUE-300) INJECTION 61%
100.0000 mL | Freq: Once | INTRAVENOUS | Status: AC | PRN
  Administered 2017-12-17: 100 mL via INTRAVENOUS

## 2017-12-17 MED ORDER — IOPAMIDOL (ISOVUE-300) INJECTION 61%
INTRAVENOUS | Status: AC
  Filled 2017-12-17: qty 100

## 2017-12-17 NOTE — Progress Notes (Signed)
CT

## 2017-12-17 NOTE — Progress Notes (Signed)
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 [facility]) Staging form: Breast, AJCC 8th Edition - Clinical stage from 05/03/2017: Stage IA (cT1a, cN0, cM0, G1, ER: Positive, PR: Positive, HER2: Negative) - Signed by [name], MD on 05/07/2017 - Pathologic stage from 08/22/2017: No Stage Recommended (ypT0, pN0, cM0, GX, ER: Unknown, PR: Unknown, HER2: Unknown) - Signed by [name], MD on 11/06/2017  Cancer of overlapping sites of right female breast [facility]) Staging form: Breast, AJCC 8th Edition - Clinical stage from 04/24/2017: Stage IIA (cT3, cN0, cM0, G2, ER+, PR+, HER2-) - Signed by [name], MD on 11/06/2017 - Pathologic stage from 08/22/2017: No Stage Recommended (ypT3, pN0(i+), cM0, G2, ER+, PR+, HER2-) - Signed by [name], MD on 10/29/2017       Cancer of overlapping sites of right female breast ([place])   03/08/2017 Mammogram    IMPRESSION: 1. Patient has a is palpable mass in the lateral right breast, in the location dense fibroglandular breast tissue. Although no discrete mass is seen sonographically or mammographically, the Clinical change remains concerning. Biopsy is recommended.  RECOMMENDATION: 1. Ultrasound-guided core needle biopsy of palpable abnormality in the lateral right breast.      04/24/2017 Breast US    IMPRESSION: Ultrasound guided biopsy of the right breast. No apparent complications.      04/24/2017 Initial Biopsy    Breast, right, needle core biopsy, UOQ, centered at 9:30 o'clock - INVASIVE  MAMMARY CARCINOMA. - SEE COMMENT. ER 100% positive PR 100% positive Ki67 10% HER2 FISH positive (ratio 2.10, copy number 3.30)  HER2 IHC was done on the biopsy sample after surgery, and was negative (1+). So the final HER2 is NEGATIVE (determined after surgery)      05/02/2017 Breast MRI    IMPRESSION: 1. 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 2. 4 mm enhancing mass with associated architectural distortion in the central left breast. This is suspicious for additional focus of carcinoma.      05/07/2017 Initial Diagnosis    Cancer of overlapping sites of right female breast ([place])      05/15/2017 Echocardiogram    ECHO 05/15/17  Impressions: - LVEF 60-65%, normal wall thickness, normal wall motion and GLS   strain, grade 1 DD, normal LV filling pressure, mild MR, normal   LA size, normal IVC.         05/22/2017 Imaging    CT CAP W Contrast 05/22/17 IMPRESSION: 1. No specific CT findings of nodal or other metastatic disease in the chest, abdomen, and pelvis. 2. Bosniak category 57F cyst in the right mid kidney, with mild enhancement along a septation but without nodularity. Unless follow up abdominal scans related to the patient's breast cancer adequately characterize this lesion, renal protocol MRI or CT scan would be recommended in 6 months time. 3. Other imaging findings of potential clinical significance: The Aortic Atherosclerosis (ICD10-I70.0). Ectatic ascending thoracic aorta without aneurysm. Small type 1 hiatal hernia. Several small hypodense liver lesions are likely  benign although technically too small to characterize. Nonobstructive 1.2 cm right renal calculus. Anterior uterine fundal myometrial mass favoring fibroid. Pelvic floor laxity with small cystocele. Notable spondylosis at L3-4 and L5-S1.       05/22/2017 Imaging    Bone Scan Whole Body 05/22/17 IMPRESSION: 1. No  findings of osseous metastatic disease. 2. Lower lumbar activity corresponds to areas of significant benign spondylosis on the CT scan.      05/24/2017 - 09/05/2017 Chemotherapy    TCHP every 3 weeks for 6 cycles starting 05/24/17 followed by a year of maintenance Herceptin and Perjeta. Given lack of response she stopped TCHP after 4 cycles on 07/26/17.  She will proceeded with Maintenance Herceptin and Perjeta on 08/16/17 and 09/05/17 only due to hesurgical smaple being HER2 negative        07/12/2017 - 07/14/2017 Hospital Admission    Admit date: 07/12/17 Admission diagnosis: GI Hemorrhaging  Additional comments: Colonoscopy and EGD done with no evidence of bleeding site. Given blood transfusion on 07/15/17      08/02/2017 Imaging    MRI Breast Bilateral 2/16//18 IMPRESSION: 1. The known lobular carcinoma of the right breast has not significantly changed since the prior MRI from 05/02/2017. The disease spans approximately 5.6 cm, previously 5.9 cm in the anterior to posterior dimension. 2. 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 3.  No new suspicious findings in either breast. 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      08/16/2017 -  Anti-estrogen oral therapy    Letrozole once daily starting 08/16/17       08/22/2017 Surgery     RIGHT MASTECTOMY WITH SENTINEL LYMPH NODE BIOPSY ERAS PATHWAY by Dr. Georgette Dover      08/22/2017 Pathology Results    See the oncology history under left breast cancer      09/25/2017 - 11/05/2017 Radiation Therapy    She has completed bilateral adjuvant radiation 09/25/17-11/05/17 managed by Dr. Isidore Moos. She tolerated well.      12/17/2017 Imaging    CT AP W Contrast 12/17/17 IMPRESSION: 1. Stable bilateral renal cysts. No change since prior  examination and no worrisome CT imaging features. Stable lower pole right renal cyst. 2. No acute abdominal/pelvic findings or lymphadenopathy. 3. Stable small hepatic cysts. 4. Stable uterine fibroids.       Cancer of central portion of left breast ([place])   05/02/2017 Breast MRI    IMPRESSION: 1. 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 2. 4 mm enhancing mass with associated architectural distortion in the central left breast. This is suspicious for additional focus of carcinoma.      05/03/2017 Mammogram    IMPRESSION: 1. Suspicious area of architectural distortion in the left breast which corresponds to a small enhancing mass on MRI.  RECOMMENDATION: Stereotactic core needle biopsy of the area of architectural distortion in the left breast.       05/03/2017 Breast US    Stereotactic core needle biopsy of the area of architectural distortion in the left breast.      05/03/2017 Initial Biopsy    Breast, left, needle core biopsy, central, slightly toward the lower, outer quadrant - INVASIVE DUCTAL CARCINOMA, MSBR GRADE 1/2. - SEE MICROSCOPIC DESCRIPTION. ER 50% positive PR  90% positive Ki67: 1% HER2 negative       05/07/2017 Initial Diagnosis    Cancer of central portion of left breast ([place])      08/22/2017 Surgery    LEFT BREAST LUMPECTOMY WITH RADIOACTIVE SEED AND SENTINEL LYMPH NODE BIOPSY ERAS PATHWAY by Dr. Georgette Dover 08/22/17       08/22/2017 Pathology Results    Diagnosis 08/22/17 1. Breast, lumpectomy, Left - ATYPICAL DUCTAL HYPERPLASIA - FIBROCYSTIC CHANGES - PREVIOUS BIOPSY SITE CHANGES - SEE COMMENT 2. Lymph node, sentinel, biopsy, a total of 4 nodes, all negative for malignant cells  6. Breast, simple mastectomy, Right - INVASIVE LOBULAR CARCINOMA, NOTTINGHAM GRADE 2/3, 6.0 CM - LOBULAR NEOPLASIA (ATYPICAL LOBULAR HYPERPLASIA) - PREVIOUS BIOPSY SITE CHANGES 7. One out of  6  SLN positive for malignant cells   Repeat HER2 IHC was negative (0) on sample 6      HISTORY OF PRESENTING ILLNESS:  Madison Stokes 72 y.o. female is here because of newly diagnosed bilateral breast cancer. She was referred by Dr. Georgette Dover. She presents to clinic accompanied by her daughter in law and husband. She noticed a right breast mass in early September 2018, she underwent diagnostic right mammogram with ultrasound on 03/08/17; no discrete mass was seen sonographically or mammographically. She underwent right breast biopsy with flip placement on 04/24/17, found to be invasive mammary carcinoma, favoring lobular type. She was referred to Dr. Georgette Dover who then ordered bilateral breast MRI. Imaging identified non mass enhancement spanning 5.9 x 3.2 x 5.6 cm in the right breast as well as a small enhancing 4 mm mass in the central left breast with a small hypoechoic focus. She then underwent diagnostic mammogram with [place] and biopsy in the left breast. Pathology revealed invasive ductal carcinoma. Her last mammogram was 04/2016, no abnormal mammogram or previous breast biopsy. She has not noticed associated breast changes, nipple discharge or inversion, or skin changes. No weight loss, decreased appetite, or fatigue.   She has no significant past medical history, she is on blood pressure and cholesterol medication. She is retired Network engineer, lives with her husband; has 2 adult sons. No tobacco or alcohol history. She reports having a cold lately she caught from her grandson but otherwise feels well. Occasionally has urgent BM after meals, denies abd fullness, pain, early satiety, nausea or vomiting.   GYN HISTORY  Menarchal: 33 LMP: age 36, menopause  Contraceptive:  HRT: None GP: 2 pregnancies, 2 births    CURRENT THERAPY:  adjuvant Letrozole once daily started 08/16/17    INTERVAL HISTORY:  Madison Stokes is here for a follow up for her bilateral breast cancer. She presents to the clinic today  accompanied by her husband. She notes she had a cruise this month which went well. She notes she has a cough from recent time spent in the yard. She notes she is doing well overall, she has recovered well from treatment with little to no residual effects. She notes he is on B complex and biotin, she can continue. She is tolerating letrozole well with manageable hot flashes.      SOCIAL HISTORY: Social History   Socioeconomic History  . Marital status: Married    Spouse name: Not on file  . Number of children: Not on file  . Years of education: Not on file  . Highest education level: Not on file  Occupational History  . Not on file  Social Needs  . Financial resource strain: Not on file  .  Food insecurity:    Worry: Not on file    Inability: Not on file  . Transportation needs:    Medical: Not on file    Non-medical: Not on file  Tobacco Use  . Smoking status: Never Smoker  . Smokeless tobacco: Never Used  Substance and Sexual Activity  . Alcohol use: Yes    Comment: not recently  . Drug use: No  . Sexual activity: Not on file  Lifestyle  . Physical activity:    Days per week: Not on file    Minutes per session: Not on file  . Stress: Not on file  Relationships  . Social connections:    Talks on phone: Not on file    Gets together: Not on file    Attends religious service: Not on file    Active member of club or organization: Not on file    Attends meetings of clubs or organizations: Not on file    Relationship status: Not on file  . Intimate partner violence:    Fear of current or ex partner: Not on file    Emotionally abused: Not on file    Physically abused: Not on file    Forced sexual activity: Not on file  Other Topics Concern  . Not on file  Social History Narrative  . Not on file    FAMILY HISTORY: Family History  Problem Relation Age of Onset  . Cancer Mother 76       uterine  . Cancer Brother        prostate  . Heart disease Father   . Heart  attack Father   . Diabetes Father   . Hypertension Father     ALLERGIES:  has No Known Allergies.  MEDICATIONS:  Current Outpatient Medications  Medication Sig Dispense Refill  . aspirin EC 81 MG tablet Take 81 mg by mouth daily.    . Biotin 10 MG CAPS Take by mouth.    Marland Kitchen HYDROcodone-acetaminophen (NORCO/VICODIN) 5-325 MG tablet Take 1 tablet by mouth every 6 (six) hours as needed for moderate pain. 12 tablet 0  . irbesartan (AVAPRO) 150 MG tablet Take 150 mg by mouth daily.     Marland Kitchen letrozole (FEMARA) 2.5 MG tablet Take 1 tablet (2.5 mg total) by mouth daily. 90 tablet 3  . loperamide (IMODIUM) 2 MG capsule Take 2-4 mg by mouth 4 (four) times daily as needed for diarrhea or loose stools.     Marland Kitchen loratadine (CLARITIN) 10 MG tablet Take 10 mg by mouth daily as needed for allergies.    . Multiple Vitamin (MULTIVITAMIN) tablet Take 1 tablet daily by mouth.    . prochlorperazine (COMPAZINE) 10 MG tablet Take 1 tablet (10 mg total) every 6 (six) hours as needed by mouth (Nausea or vomiting). 30 tablet 1  . simvastatin (ZOCOR) 40 MG tablet Take 40 mg at bedtime by mouth.    Marland Kitchen VITAMIN B COMPLEX-C PO Take by mouth.     No current facility-administered medications for this visit.     REVIEW OF SYSTEMS:  Constitutional: Denies fatigue, fevers, chills or abnormal night sweats  (+) hot flashes, tolerable Eyes: Denies blurriness of vision, double vision or watery eyes Ears, nose, mouth, throat, and face: Denies mucositis or sore throat  Respiratory: Denies dyspnea or wheezes  Cardiovascular: Denies palpitation, chest discomfort or lower extremity swelling Gastrointestinal:  Denies nausea, vomiting   Skin: negative Lymphatics: Denies new lymphadenopathy or easy bruising Neurological:Denies new weaknesses (+) tingling in her  feet, nearly resolved.  Behavioral/Psych: Mood is stable, no new changes  All other systems were reviewed with the patient and are negative.  PHYSICAL EXAMINATION:  ECOG  PERFORMANCE STATUS: 1  Vitals:   12/20/17 1105  BP: (!) 146/83  Pulse: 76  Resp: 17  Temp: 98.5 F (36.9 C)  SpO2: 96%   Filed Weights   12/20/17 1105  Weight: 124 lb 8 oz (56.5 kg)     GENERAL:alert, no distress and comfortable SKIN: skin color, texture, turgor are normal, no rashes or significant lesions EYES: normal, conjunctiva are pink and non-injected, sclera clear OROPHARYNX:no exudate, no erythema and lips, buccal mucosa, and tongue normal  NECK: supple, thyroid normal size, non-tender, without nodularity LYMPH:  no palpable cervical, supraclavicular, axillary, or inguinal lymphadenopathy  LUNGS: clear to auscultation bilaterally with normal breathing effort HEART: regular rate & rhythm and no murmurs and no lower extremity edema ABDOMEN:abdomen soft, non-tender and normal bowel sounds. No palpable hepatomegaly  Musculoskeletal:no cyanosis of digits and no clubbing  PSYCH: alert & oriented x 3 with fluent speech NEURO: no focal motor/sensory deficits BREASTS: S/p right mastectomy and left lumpectomy: (+) right breast surgically absent, surgical scars has healed well. (+) improved diffuse skin hyperpigmentation. No palpable mass or adenopathy.   CBC Latest Ref Rng & Units 12/17/2017 11/05/2017 09/26/2017  WBC 3.9 - 10.3 K/uL 3.4(L) 4.4 3.3(L)  Hemoglobin 11.6 - 15.9 g/dL 13.0 11.4(L) 9.6(L)  Hematocrit 34.8 - 46.6 % 39.8 36.1 30.6(L)  Platelets 145 - 400 K/uL 216 211 264   CMP Latest Ref Rng & Units 12/17/2017 11/05/2017 09/26/2017  Glucose 70 - 99 mg/dL 112(H) 170(H) 99  BUN 8 - 23 mg/dL _0 Creatinine 0.44 - 1.00 mg/dL 0.93 1.13(H) 0.78  Sodium 135 - 145 mmol/L 142 143 143  Potassium 3.5 - 5.1 mmol/L 4.3 4.4 4.2  Chloride 98 - 111 mmol/L 104 106 109  CO2 22 - 32 mmol/L _1 Calcium 8.9 - 10.3 mg/dL 9.7 9.4 9.4  Total Protein 6.5 - 8.1 g/dL 7.0 6.8 6.4  Total Bilirubin 0.3 - 1.2 mg/dL 0.4 <0.2(L) 0.3  Alkaline Phos 38 - 126 U/L 101 100 98  AST 15 - 41 U/L  37 32 27  ALT 0 - 44 U/L 36 26 27     CA 27.29  05/13/17: 59.5 05/31/17: 50.0 07/04/17: 44.2 08/02/17: 49.6 09/05/17: 31.7 09/26/2017: 30.7 11/05/17: 36.6 12/17/17: 103.3   Diagnosis 08/22/17 1. Breast, lumpectomy, Left - ATYPICAL DUCTAL HYPERPLASIA - FIBROCYSTIC CHANGES - PREVIOUS BIOPSY SITE CHANGES - SEE COMMENT 2. Lymph node, sentinel, biopsy, Left Axillary #1 - NO CARCINOMA IDENTIFIED IN ONE LYMPH NODE (0/1) - SEE COMMENT 3. Lymph node, sentinel, biopsy, Left Axillary #2 - NO CARCINOMA IDENTIFIED IN ONE LYMPH NODE (0/1) - SEE COMMENT 4. Lymph node, sentinel, biopsy, Left Axillary #3 - NO CARCINOMA IDENTIFIED IN ONE LYMPH NODE (0/1) - SEE COMMENT 5. Lymph node, sentinel, biopsy, Left - NO CARCINOMA IDENTIFIED IN ONE LYMPH NODE (0/1) - SEE COMMENT 6. 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 Lymph node, sentinel, biopsy, Right Axillary #1 - NO CARCINOMA IDENTIFIED IN ONE LYMPH NODE (0/1) - SEE COMMENT 8. Lymph node, sentinel, biopsy, Right - NO CARCINOMA IDENTIFIED IN ONE LYMPH NODE (0/1) - SEE COMMENT 9. Lymph node, sentinel, biopsy, Right - NO CARCINOMA IDENTIFIED IN  ONE LYMPH NODE (0/1) 1 of 5 FINAL for Prewitt, Megahn ([id number]) Diagnosis(continued) - SEE COMMENT 10. Lymph node, sentinel, biopsy, Right Axillary #2 - METASTATIC CARCINOMA PRESENT IN ONE LYMPH NODE (1/1) - SEE COMMENT 11. Lymph node, sentinel, biopsy, Right Axillary #3 - NO CARCINOMA IDENTIFIED IN ONE LYMPH NODE (0/1) - SEE COMMENT 12. Lymph node, sentinel, biopsy, Right - NO CARCINOMA IDENTIFIED IN ONE LYMPH NODE (0/1) - SEE COMMENT Microscopic Comment 1. An oncology table was not completed on this specimen because here was no residual carcinoma identified. 2. -5 and 7-12. Cytokeratin AE1/3 was utilized to exclude  micrometastasis. Only one lymph node has isolated tumor cells (see part 10); all other lymph nodes were negative. 6. BREAST, STATUS POST NEOADJUVANT TREATMENT Procedure: Mastectomy Laterality: Right Tumor Size: 6 x 3.5x 2.5 cm (see comment) Histologic Type: Invasive lobular carcinoma Grade: Nottingham Grade 2 Tubular Differentiation: 3 Nuclear Pleomorphism: 3 Mitotic Count: 1 Ductal Carcinoma in Situ (DCIS): Not identified Regional Lymph Nodes: Number of Lymph Nodes Examined:10 Number of Sentinel Lymph Nodes Examined: 10 Lymph Nodes with Macrometastases: 0 Lymph Nodes with Micrometastases: 0 Lymph Nodes with Isolated Tumor Cells: 1 Margins: Uninvolved by carcinoma Invasive carcinoma, distance from closest margin: 0.9 (anterior soft tissue margin) DCIS, distance from closest margin: N/A Breast Prognostic Profile (pre-neoadjuvant case #: [id number]) Estrogen Receptor: Positive (100%, strong) Progesterone Receptor: Positive (100%, strong) Her2: Positive (Ratio: 2.10) Ki-67: 10% Residual Cancer Burden (RCB): Primary Tumor Bed: 60 mm x 66m Overall Cancer Cellularity: 30% Percentage of Cancer that is in Situ: N/A Number of Positive Lymph Nodes: 1 Diameter of Largest Lymph Node metastasis: 1 mm Residual Cancer Burden : 3.186 Residual Cancer Burden Class: RCB-II Pathologic Stage Classification (p TNM, AJCC 8th Edition): Primary Tumor: ypT3 Regional Lymph Nodes: ypN0(i+) COMMENT: There is residual tumor present in all of the submitted blocks from the upper and lower outer quadrant of the breast and therefore the measurement of the tumor is based on the gross measurement.  6. By immunohistochemistry, the tumor cells are negative for Her2 (0).    Diagnosis 05/03/17  Breast, left, needle core biopsy, central, slightly toward the lower, outer quadrant - INVASIVE DUCTAL CARCINOMA, MSBR GRADE 1/2. - SEE MICROSCOPIC DESCRIPTION. ER 50% positive PR 90% positive Ki67 1% Microscopic  Comment Breast prognostic profile will be performed. Called to [facility] 05/06/2017.   Diagnosis 04/24/17  Breast, right, needle core biopsy, UOQ, centered at 9:30 o'clock - INVASIVE MAMMARY CARCINOMA. - SEE COMMENT. Microscopic Comment The carcinoma appears grade II. An E-cadherin and a breast prognostic profile will be performed and the results reported separately. The results were called to [facility] 04/25/2017. (JBK:ecj 04/25/2017) Results: IMMUNOHISTOCHEMICAL AND MORPHOMETRIC ANALYSIS PERFORMED MANUALLY Estrogen Receptor: 100%, POSITIVE, STRONG STAINING INTENSITY Progesterone Receptor: 100%, POSITIVE, STRONG STAINING INTENSITY Proliferation Marker Ki67: 10% HER2: **POSITIVE** RATIO OF HER2/CEP17 SIGNALS 2.10 AVERAGE HER2 COPY NUMBER PER CELL 3.30  PROCEDURES  ECHO 09/20/17 Impressions: - Compared to prior study, LV strain has increased from -20% to   -18%  Colonoscopy by Dr. KTherisa Doyne1/20/19 IMPRESSION:  - The examined portion of the ileum was normal. - The entire examined colon is normal. - The distal rectum and anal verge are normal on retroflexion view. - No specimens collected    EGD by Dr. KTherisa Doyne1/19/19 IMPRESSION: - Normal esophagus. - Z-line regular, 36 cm from the incisors. - 2 cm hiatal hernia. - Non-bleeding erosive gastropathy. - Erythematous mucosa in the prepyloric region  of the stomach. - Duodenal erosions without bleeding. - No specimens collected.   ECHO 05/15/17  Impressions: - LVEF 60-65%, normal wall thickness, normal wall motion and GLS   strain, grade 1 DD, normal LV filling pressure, mild MR, normal   LA size, normal IVC.   RADIOGRAPHIC STUDIES: I have personally reviewed the radiological images as listed and agreed with the findings in the report.   CT AP W Contrast 12/17/17 IMPRESSION: 1. Stable bilateral renal cysts. No change since prior examination and no worrisome CT imaging  features. Stable lower pole right renal cyst. 2. No acute abdominal/pelvic findings or lymphadenopathy. 3. Stable small hepatic cysts. 4. Stable uterine fibroids.   Whole Body Bone Scan 05/22/17 IMPRESSION: 1. No findings of osseous metastatic disease. 2. Lower lumbar activity corresponds to areas of significant benign spondylosis on the CT scan  CT CAP with Contrast IMPRESSION: 1. No specific CT findings of nodal or other metastatic disease in the chest, abdomen, and pelvis. 2. Bosniak category 68F cyst in the right mid kidney, with mild enhancement along a septation but without nodularity. Unless follow up abdominal scans related to the patient's breast cancer adequately characterize this lesion, renal protocol MRI or CT scan would be recommended in 6 months time. 3. Other imaging findings of potential clinical significance: The Aortic Atherosclerosis (ICD10-I70.0). Ectatic ascending thoracic aorta without aneurysm. Small type 1 hiatal hernia. Several small hypodense liver lesions are likely benign although technically too small to characterize. Nonobstructive 1.2 cm right renal calculus. Anterior uterine fundal myometrial mass favoring fibroid. Pelvic floor laxity with small cystocele. Notable spondylosis at L3-4 and L5-S1.  Diagnostic Mammogram Bilateral 05/03/17  IMPRESSION: 1. 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 2. 4 mm enhancing mass with associated architectural distortion in the central left breast. This is suspicious for additional focus of Carcinoma.  Diagnostic Mammogram, right 03/08/17 IMPRESSION: 1. Patient has a is palpable mass in the 9:30 position in the lateral right breast 2 cm from the nipple, in the location dense fibroglandular breast tissue. Although no discrete mass is seen sonographically or mammographically, the Clinical change remains concerning. Biopsy is  recommended.  Ct Abdomen Pelvis W Wo Contrast  Result Date: 12/17/2017 CLINICAL DATA:  Followup bilateral renal cysts. Prior history of bilateral breast cancer. EXAM: CT ABDOMEN AND PELVIS WITHOUT AND WITH CONTRAST TECHNIQUE: Multidetector CT imaging of the abdomen and pelvis was performed following the standard protocol before and following the bolus administration of intravenous contrast. CONTRAST:  122m ISOVUE-300 IOPAMIDOL (ISOVUE-300) INJECTION 61% COMPARISON:  CT scan 05/22/2017 FINDINGS: Lower chest: The lung bases are clear. No worrisome pulmonary nodules. The heart is within normal limits in size. Moderate pectus deformity. No pericardial effusion. Small hiatal hernia. Hepatobiliary: Stable small hepatic cysts. No worrisome hepatic lesions. No intrahepatic biliary dilatation. The gallbladder is normal. No common bile duct dilatation. Pancreas: No mass, inflammation or ductal dilatation. Spleen: Normal size.  No focal lesions. Adrenals/Urinary Tract: The adrenal glands are unremarkable. Stable bilateral renal cysts. The large septated cyst associated with the right kidney is unchanged. Thin calcification along the main septum. No worrisome enhancement or nodularity. No worrisome renal lesions. Stable lower pole right renal calculus measuring 12 mm. Stomach/Bowel: The stomach, duodenum, small bowel and colon are grossly normal. No inflammatory changes, mass lesions or obstructive findings. The terminal ileum and appendix are normal. Vascular/Lymphatic: The aorta and branch vessels are normal. The major venous structures are  patent. No mesenteric or retroperitoneal mass or adenopathy. Small scattered lymph nodes are stable. Reproductive: Stable uterine fibroid.  The ovaries are normal. Other: No pelvic mass or adenopathy. No free pelvic fluid collections. No inguinal mass or adenopathy. No abdominal wall hernia or subcutaneous lesions. Musculoskeletal: No significant bony findings. Scoliosis and  degenerative lumbar spondylosis. Significant facet disease in the lower lumbar spine, particularly at L5-S1 on the left. IMPRESSION: 1. Stable bilateral renal cysts. No change since prior examination and no worrisome CT imaging features. Stable lower pole right renal cyst. 2. No acute abdominal/pelvic findings or lymphadenopathy. 3. Stable small hepatic cysts. 4. Stable uterine fibroids. Electronically Signed   By: Marijo Sanes M.D.   On: 12/17/2017 16:55    ASSESSMENT & PLAN:  72 y.o. caucasian postmenopausal woman   1.  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 In her right breast, she has large area of disease that is HER2 initially diagnosed as positive by FISH. -Due to the high risk of recurrence from right breast cancer, she underwent neoadjuvnat chemo with TCHP -Due to her lack of response to chemotherapy, chemo was held after 4 cycles.  -She underwent right mastectomy and left breast lumpectomy with b/l sentinel lymph node biopsy on 08/22/17.  -Pathology results were reviewed with pt which showed a 6cm invasive ductal carcinoma in right breast, with 1 out of 6 positive lymph nodes. Margins were negative. Left breast invasive ductal carcinoma had complete response to chemotherapy tumor.  -We discussed right side lymph node dissection, giving her 1 out of 6 positive lymph nodes, I do not feel strongly she needs lymph node dissection.  -I previously requested HER2 to be retested on her surgical right sided tumor, the IHC test was negative (0). I have requested to do HER2 IHC on her initial biopsy of right side tumor, and it was also negative  (1+) -Given the final negative HER-2, I have stopped her adjuvant Herceptin and pejeta maintenance therapy. She only had 2 doses on 08/16/17 and 09/05/17.  -I do not recommend adjuvant chemotherapy as her tumor was not sensitive to chemo. -She has completed bilateral adjuvant radiation 09/25/17-11/05/17. She tolerated well. -She was started on adjuvant letrozole on 08/16/17. She is tolerating letrozole well overall with manageable hot flashes. Continue letrozole daily for 10 years.  -She had PAC removal on 11/06/17 -We discussed her CT AP from 12/17/17 which showed stable renal and hepatic cysts and uterine fibroids with no acute AP or lymphadenopathy findings. No evidence of recurrence in the abdomen and the pelvis -I reviewed her labs, WBC stable at 3.4 from previous treatment and glucose at 112, otherwise CBC and CMP WNL. Her CEA has significantly increased to 103.3. Will monitor closely with repeat CEA in 1 month, if still elevated or worsened will get PET scan to rule out recurrence/metastasis.  -Otherwise she is clinically doing well. Her physical exam was unremarkable. There is no high clinical concern for recurrence. -Next Mammogram in 02/2018   -F/u in 3 months    2. Genetics  -Given her patient's synchronous bilateral ductal and lobular carcinoma, her brother's prostate cancer and her mother's uterine cancer in her 11's, I previously  referred her to genetics. She has not been seen by them.   3. Bosniak category 107F cyst in the right mid kidney, 1.2cm Non-obstructive kidney stone -found on 05/22/17 CT Scan  -Her 12/17/17 CT AP showed stable renal and hepatic cysts. Will continue to monitor.    4. Anemia, secondary to chemotherapy, and history of GI bleeding in 06/2017 -She experienced significant GI bleeding with HG of 5.7 and HCT of 17.4 and was hospitalized on 07/12/17. GI workup showed no site of bleeding. She received blood transfusion on 07/15/17. Hg recovered and showed good response to blood  transfusion.  -anemia resolved currently.   6. Bone Health  -With start of Letrozole I previously discussed the side effect of weak bone from letrozole   -Her 12/18/17 DEXA had to be reschedule due to CT scan.    PLAN:  -Lab in 3-4 weeks to repeat CA27.29, if still high, will get a PET scan  -Mammogram in 02/2018 -Lab and f/u in 3 months      No orders of the defined types were placed in this encounter.   All questions were answered. The patient knows to call the clinic with any problems, questions or concerns.   [name], MD 12/20/2017 12:02 PM   I, Joslyn Devon, am acting as scribe for [name], MD.   I have reviewed the above documentation for accuracy and completeness, and I agree with the above.

## 2017-12-18 ENCOUNTER — Ambulatory Visit
Admission: RE | Admit: 2017-12-18 | Discharge: 2017-12-18 | Disposition: A | Discharge status: Home / Self Care | Payer: Medicare Other | Source: Ambulatory Visit | Attending: Hematology | Admitting: Hematology

## 2017-12-18 DIAGNOSIS — E2839 Other primary ovarian failure: Secondary | ICD-10-CM

## 2017-12-18 LAB — CANCER ANTIGEN 27.29: CA 27.29: 103.3 U/mL — ABNORMAL HIGH (ref 0.0–38.6)

## 2017-12-20 ENCOUNTER — Telehealth: Payer: Self-pay | Admitting: Hematology

## 2017-12-20 ENCOUNTER — Inpatient Hospital Stay (HOSPITAL_BASED_OUTPATIENT_CLINIC_OR_DEPARTMENT_OTHER): Payer: Medicare Other | Admitting: Hematology

## 2017-12-20 VITALS — BP 146/83 | HR 76 | Temp 98.5°F | Resp 17 | Ht 62.0 in | Wt 124.5 lb

## 2017-12-20 DIAGNOSIS — Z9221 Personal history of antineoplastic chemotherapy: Secondary | ICD-10-CM | POA: Diagnosis not present

## 2017-12-20 DIAGNOSIS — Z79811 Long term (current) use of aromatase inhibitors: Secondary | ICD-10-CM

## 2017-12-20 DIAGNOSIS — D259 Leiomyoma of uterus, unspecified: Secondary | ICD-10-CM

## 2017-12-20 DIAGNOSIS — I10 Essential (primary) hypertension: Secondary | ICD-10-CM

## 2017-12-20 DIAGNOSIS — C50811 Malignant neoplasm of overlapping sites of right female breast: Secondary | ICD-10-CM

## 2017-12-20 DIAGNOSIS — Z8049 Family history of malignant neoplasm of other genital organs: Secondary | ICD-10-CM | POA: Diagnosis not present

## 2017-12-20 DIAGNOSIS — N951 Menopausal and female climacteric states: Secondary | ICD-10-CM | POA: Diagnosis not present

## 2017-12-20 DIAGNOSIS — Z923 Personal history of irradiation: Secondary | ICD-10-CM | POA: Diagnosis not present

## 2017-12-20 DIAGNOSIS — K7689 Other specified diseases of liver: Secondary | ICD-10-CM

## 2017-12-20 DIAGNOSIS — E119 Type 2 diabetes mellitus without complications: Secondary | ICD-10-CM

## 2017-12-20 DIAGNOSIS — C50112 Malignant neoplasm of central portion of left female breast: Secondary | ICD-10-CM | POA: Diagnosis not present

## 2017-12-20 DIAGNOSIS — N200 Calculus of kidney: Secondary | ICD-10-CM

## 2017-12-20 DIAGNOSIS — Z170 Estrogen receptor positive status [ER+]: Secondary | ICD-10-CM | POA: Diagnosis not present

## 2017-12-20 DIAGNOSIS — Z9011 Acquired absence of right breast and nipple: Secondary | ICD-10-CM | POA: Diagnosis not present

## 2017-12-20 DIAGNOSIS — N281 Cyst of kidney, acquired: Secondary | ICD-10-CM | POA: Diagnosis not present

## 2017-12-20 NOTE — Telephone Encounter (Signed)
Appointments scheduled AVS/Calendar printed per 6/28 los

## 2017-12-21 ENCOUNTER — Encounter: Payer: Self-pay | Admitting: Hematology

## 2017-12-30 ENCOUNTER — Telehealth: Payer: Self-pay

## 2017-12-30 NOTE — Telephone Encounter (Signed)
Left patient voice message that Dr. Burr Medico suggests watching her symptoms for one week, if don't resolve call us back and we will want to see her back.

## 2017-12-30 NOTE — Telephone Encounter (Signed)
Patient states that since Friday she has a swollen gland on her neck, it has decreased in size, denies sore throat, no fevers, no congestion.  Since Saturday having numbness in the thighs bilaterally and a knot on top of her knee.  Denies pain.   Wants advice on what she should do.

## 2018-01-13 ENCOUNTER — Inpatient Hospital Stay: Payer: Medicare Other | Attending: Hematology

## 2018-01-13 DIAGNOSIS — C50811 Malignant neoplasm of overlapping sites of right female breast: Secondary | ICD-10-CM | POA: Diagnosis not present

## 2018-01-13 DIAGNOSIS — C50112 Malignant neoplasm of central portion of left female breast: Secondary | ICD-10-CM | POA: Diagnosis not present

## 2018-01-13 DIAGNOSIS — Z170 Estrogen receptor positive status [ER+]: Secondary | ICD-10-CM

## 2018-01-13 LAB — COMPREHENSIVE METABOLIC PANEL
ALT: 36 U/L (ref 0–44)
AST: 35 U/L (ref 15–41)
Albumin: 4.2 g/dL (ref 3.5–5.0)
Alkaline Phosphatase: 99 U/L (ref 38–126)
Anion gap: 8 (ref 5–15)
BUN: 14 mg/dL (ref 8–23)
CO2: 29 mmol/L (ref 22–32)
Calcium: 10.1 mg/dL (ref 8.9–10.3)
Chloride: 106 mmol/L (ref 98–111)
Creatinine, Ser: 0.95 mg/dL (ref 0.44–1.00)
GFR calc Af Amer: >60 mL/min (ref >60)
GFR calc non Af Amer: 60 mL/min — ABNORMAL LOW (ref >60)
Glucose, Bld: 103 mg/dL — ABNORMAL HIGH (ref 70–99)
Potassium: 4.6 mmol/L (ref 3.5–5.1)
Sodium: 143 mmol/L (ref 135–145)
Total Bilirubin: 0.4 mg/dL (ref 0.3–1.2)
Total Protein: 7.1 g/dL (ref 6.5–8.1)

## 2018-01-13 LAB — CBC WITH DIFFERENTIAL/PLATELET
Basophils Absolute: 0.1 10*3/uL (ref 0.0–0.1)
Basophils Relative: 2 %
Eosinophils Absolute: 0.1 10*3/uL (ref 0.0–0.5)
Eosinophils Relative: 3 %
HCT: 42.2 % (ref 34.8–46.6)
Hemoglobin: 13.8 g/dL (ref 11.6–15.9)
Lymphocytes Relative: 24 %
Lymphs Abs: 0.9 10*3/uL (ref 0.9–3.3)
MCH: 28.7 pg (ref 25.1–34.0)
MCHC: 32.7 g/dL (ref 31.5–36.0)
MCV: 87.7 fL (ref 79.5–101.0)
Monocytes Absolute: 0.6 10*3/uL (ref 0.1–0.9)
Monocytes Relative: 16 %
Neutro Abs: 2.2 10*3/uL (ref 1.5–6.5)
Neutrophils Relative %: 55 %
Platelets: 215 10*3/uL (ref 145–400)
RBC: 4.81 MIL/uL (ref 3.70–5.45)
RDW: 18.9 % — ABNORMAL HIGH (ref 11.2–14.5)
WBC: 3.9 10*3/uL (ref 3.9–10.3)

## 2018-01-14 ENCOUNTER — Telehealth: Payer: Self-pay

## 2018-01-14 ENCOUNTER — Other Ambulatory Visit: Payer: Self-pay | Admitting: Hematology

## 2018-01-14 DIAGNOSIS — C50811 Malignant neoplasm of overlapping sites of right female breast: Secondary | ICD-10-CM

## 2018-01-14 DIAGNOSIS — Z170 Estrogen receptor positive status [ER+]: Secondary | ICD-10-CM

## 2018-01-14 LAB — CANCER ANTIGEN 27.29: CA 27.29: 91.1 U/mL — ABNORMAL HIGH (ref 0.0–38.6)

## 2018-01-14 NOTE — Telephone Encounter (Signed)
Left voice message for patient have scheduled her PET scan for Wednesday 7/31 at 8:30 at [facility], NPO 6 hours prior.  Informed her we can either schedule her a follow up with Dr. Burr Medico or call her with results.  Requested to call back with her preference.

## 2018-01-21 ENCOUNTER — Encounter: Payer: Self-pay | Admitting: Hematology

## 2018-01-22 ENCOUNTER — Ambulatory Visit (HOSPITAL_COMMUNITY)
Admission: RE | Admit: 2018-01-22 | Discharge: 2018-01-22 | Disposition: A | Discharge status: Home / Self Care | Payer: Medicare Other | Source: Ambulatory Visit | Attending: Hematology | Admitting: Hematology

## 2018-01-22 DIAGNOSIS — Z170 Estrogen receptor positive status [ER+]: Secondary | ICD-10-CM | POA: Diagnosis not present

## 2018-01-22 DIAGNOSIS — C50912 Malignant neoplasm of unspecified site of left female breast: Secondary | ICD-10-CM | POA: Diagnosis not present

## 2018-01-22 DIAGNOSIS — Z9011 Acquired absence of right breast and nipple: Secondary | ICD-10-CM | POA: Insufficient documentation

## 2018-01-22 DIAGNOSIS — C50811 Malignant neoplasm of overlapping sites of right female breast: Secondary | ICD-10-CM | POA: Diagnosis not present

## 2018-01-22 DIAGNOSIS — C50911 Malignant neoplasm of unspecified site of right female breast: Secondary | ICD-10-CM | POA: Diagnosis not present

## 2018-01-22 LAB — GLUCOSE, CAPILLARY: Glucose-Capillary: 109 mg/dL — ABNORMAL HIGH (ref 70–99)

## 2018-01-22 MED ORDER — FLUDEOXYGLUCOSE F - 18 (FDG) INJECTION
6.1000 | Freq: Once | INTRAVENOUS | Status: AC | PRN
  Administered 2018-01-22: 6.1 via INTRAVENOUS

## 2018-01-29 ENCOUNTER — Ambulatory Visit
Admission: RE | Admit: 2018-01-29 | Discharge: 2018-01-29 | Disposition: A | Discharge status: Home / Self Care | Payer: Medicare Other | Source: Ambulatory Visit | Attending: Hematology | Admitting: Hematology

## 2018-01-29 ENCOUNTER — Telehealth: Payer: Self-pay

## 2018-01-29 DIAGNOSIS — M8589 Other specified disorders of bone density and structure, multiple sites: Secondary | ICD-10-CM | POA: Diagnosis not present

## 2018-01-29 DIAGNOSIS — Z780 Asymptomatic menopausal state: Secondary | ICD-10-CM | POA: Diagnosis not present

## 2018-01-29 NOTE — Telephone Encounter (Signed)
LVM for pt reminding of SCP visit on 02/05/18 at 10 am with NP. Left Center call back number on vm in case of questions.

## 2018-02-05 ENCOUNTER — Encounter: Payer: Self-pay | Admitting: Adult Health

## 2018-02-05 ENCOUNTER — Encounter: Payer: Self-pay | Admitting: Genetic Counselor

## 2018-02-05 ENCOUNTER — Inpatient Hospital Stay (HOSPITAL_BASED_OUTPATIENT_CLINIC_OR_DEPARTMENT_OTHER): Payer: Medicare Other | Admitting: Adult Health

## 2018-02-05 ENCOUNTER — Inpatient Hospital Stay (HOSPITAL_BASED_OUTPATIENT_CLINIC_OR_DEPARTMENT_OTHER): Payer: Medicare Other | Admitting: Genetic Counselor

## 2018-02-05 ENCOUNTER — Telehealth: Payer: Self-pay | Admitting: Hematology

## 2018-02-05 ENCOUNTER — Inpatient Hospital Stay: Payer: Medicare Other | Attending: Hematology

## 2018-02-05 VITALS — BP 139/88 | HR 75 | Temp 97.9°F | Resp 18 | Wt 125.1 lb

## 2018-02-05 DIAGNOSIS — Z170 Estrogen receptor positive status [ER+]: Secondary | ICD-10-CM

## 2018-02-05 DIAGNOSIS — Z8042 Family history of malignant neoplasm of prostate: Secondary | ICD-10-CM

## 2018-02-05 DIAGNOSIS — C50811 Malignant neoplasm of overlapping sites of right female breast: Secondary | ICD-10-CM | POA: Diagnosis not present

## 2018-02-05 DIAGNOSIS — C50112 Malignant neoplasm of central portion of left female breast: Secondary | ICD-10-CM | POA: Diagnosis not present

## 2018-02-05 DIAGNOSIS — Z808 Family history of malignant neoplasm of other organs or systems: Secondary | ICD-10-CM

## 2018-02-05 DIAGNOSIS — N951 Menopausal and female climacteric states: Secondary | ICD-10-CM | POA: Diagnosis not present

## 2018-02-05 DIAGNOSIS — Z315 Encounter for genetic counseling: Secondary | ICD-10-CM | POA: Diagnosis not present

## 2018-02-05 DIAGNOSIS — Z79899 Other long term (current) drug therapy: Secondary | ICD-10-CM | POA: Diagnosis not present

## 2018-02-05 DIAGNOSIS — M7989 Other specified soft tissue disorders: Secondary | ICD-10-CM

## 2018-02-05 DIAGNOSIS — M8580 Other specified disorders of bone density and structure, unspecified site: Secondary | ICD-10-CM

## 2018-02-05 DIAGNOSIS — Z8049 Family history of malignant neoplasm of other genital organs: Secondary | ICD-10-CM

## 2018-02-05 DIAGNOSIS — Z8041 Family history of malignant neoplasm of ovary: Secondary | ICD-10-CM | POA: Diagnosis not present

## 2018-02-05 LAB — COMPREHENSIVE METABOLIC PANEL
ALT: 30 U/L (ref 0–44)
AST: 29 U/L (ref 15–41)
Albumin: 4 g/dL (ref 3.5–5.0)
Alkaline Phosphatase: 89 U/L (ref 38–126)
Anion gap: 10 (ref 5–15)
BUN: 21 mg/dL (ref 8–23)
CO2: 26 mmol/L (ref 22–32)
Calcium: 9.6 mg/dL (ref 8.9–10.3)
Chloride: 108 mmol/L (ref 98–111)
Creatinine, Ser: 0.83 mg/dL (ref 0.44–1.00)
GFR calc Af Amer: >60 mL/min (ref >60)
GFR calc non Af Amer: >60 mL/min (ref >60)
Glucose, Bld: 105 mg/dL — ABNORMAL HIGH (ref 70–99)
Potassium: 4.3 mmol/L (ref 3.5–5.1)
Sodium: 144 mmol/L (ref 135–145)
Total Bilirubin: 0.6 mg/dL (ref 0.3–1.2)
Total Protein: 6.7 g/dL (ref 6.5–8.1)

## 2018-02-05 LAB — CBC WITH DIFFERENTIAL/PLATELET
Basophils Absolute: 0.1 10*3/uL (ref 0.0–0.1)
Basophils Relative: 2 %
Eosinophils Absolute: 0.1 10*3/uL (ref 0.0–0.5)
Eosinophils Relative: 4 %
HCT: 40.4 % (ref 34.8–46.6)
Hemoglobin: 13.4 g/dL (ref 11.6–15.9)
Lymphocytes Relative: 23 %
Lymphs Abs: 0.7 10*3/uL — ABNORMAL LOW (ref 0.9–3.3)
MCH: 29.6 pg (ref 25.1–34.0)
MCHC: 33.1 g/dL (ref 31.5–36.0)
MCV: 89.4 fL (ref 79.5–101.0)
Monocytes Absolute: 0.6 10*3/uL (ref 0.1–0.9)
Monocytes Relative: 18 %
Neutro Abs: 1.7 10*3/uL (ref 1.5–6.5)
Neutrophils Relative %: 53 %
Platelets: 195 10*3/uL (ref 145–400)
RBC: 4.52 MIL/uL (ref 3.70–5.45)
RDW: 20.6 % — ABNORMAL HIGH (ref 11.2–14.5)
WBC: 3.2 10*3/uL — ABNORMAL LOW (ref 3.9–10.3)

## 2018-02-05 NOTE — Progress Notes (Signed)
CLINIC:  Survivorship   REASON FOR VISIT:  Routine follow-up post-treatment for a recent history of breast cancer.  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 [facility]) Staging form: Breast, AJCC 8th Edition - Clinical stage from 05/03/2017: Stage IA (cT1a, cN0, cM0, G1, ER: Positive, PR: Positive, HER2: Negative) - Signed by [name], MD on 05/07/2017 - Pathologic stage from 08/22/2017: No Stage Recommended (ypT0, pN0, cM0, GX, ER: Unknown, PR: Unknown, HER2: Unknown) - Signed by [name], MD on 11/06/2017  Cancer of overlapping sites of right female breast [facility]) Staging form: Breast, AJCC 8th Edition - Clinical stage from 04/24/2017: Stage IIA (cT3, cN0, cM0, G2, ER+, PR+, HER2-) - Signed by [name], MD on 11/06/2017 - Pathologic stage from 08/22/2017: No Stage Recommended (ypT3, pN0(i+), cM0, G2, ER+, PR+, HER2-) - Signed by [name], MD on 10/29/2017       Cancer of overlapping sites of right female breast ([place])   03/08/2017 Mammogram    IMPRESSION: 1. Patient has a is palpable mass in the lateral right breast, in the location dense fibroglandular breast tissue. Although no discrete mass is seen sonographically or mammographically, the Clinical change remains concerning. Biopsy is recommended.  RECOMMENDATION: 1. Ultrasound-guided core needle biopsy of palpable abnormality in the lateral right breast.    04/24/2017 Breast US    IMPRESSION: Ultrasound guided biopsy of the right breast. No apparent complications.    04/24/2017 Initial Biopsy    Breast, right, needle core biopsy, UOQ, centered at 9:30 o'clock - INVASIVE MAMMARY CARCINOMA. - SEE COMMENT. ER 100% positive PR 100% positive Ki67 10% HER2 FISH positive (ratio 2.10, copy number 3.30)  HER2 IHC was done on the biopsy sample after surgery, and was negative (1+). So the final HER2 is NEGATIVE (determined after surgery)    05/02/2017 Breast MRI   IMPRESSION: 1. 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 2. 4 mm enhancing mass with associated architectural distortion in the central left breast. This is suspicious for additional focus of carcinoma.    05/07/2017 Initial Diagnosis    Cancer of overlapping sites of right female breast ([place])    05/15/2017 Echocardiogram    ECHO 05/15/17  Impressions: - LVEF 60-65%, normal wall thickness, normal wall motion and GLS   strain, grade 1 DD, normal LV filling pressure, mild MR, normal   LA size, normal IVC.       05/22/2017 Imaging    CT CAP W Contrast 05/22/17 IMPRESSION: 1. No specific CT findings of nodal or other metastatic disease in the chest, abdomen, and pelvis. 2. Bosniak category 33F cyst in the right mid kidney, with mild enhancement along a septation but without nodularity. Unless follow up abdominal scans related to the patient's breast cancer adequately characterize this lesion, renal protocol MRI or CT scan would be recommended in 6 months time. 3. Other imaging findings of potential clinical significance: The Aortic Atherosclerosis (ICD10-I70.0). Ectatic ascending thoracic aorta without aneurysm. Small type 1 hiatal hernia. Several small hypodense liver lesions are likely benign although technically too small to characterize. Nonobstructive 1.2 cm right renal calculus. Anterior uterine fundal myometrial mass favoring fibroid. Pelvic floor laxity with small cystocele. Notable spondylosis at L3-4 and L5-S1.     05/22/2017 Imaging    Bone Scan Whole Body 05/22/17 IMPRESSION: 1. No findings of osseous metastatic disease. 2. Lower lumbar activity corresponds to  areas of significant benign spondylosis on the CT scan.    05/24/2017 - 09/05/2017 Chemotherapy    TCHP every 3 weeks for 6 cycles starting 05/24/17 followed by a year of maintenance Herceptin and Perjeta. Given lack of response  she stopped TCHP after 4 cycles on 07/26/17.  She will proceeded with Maintenance Herceptin and Perjeta on 08/16/17 and 09/05/17 only due to hesurgical smaple being HER2 negative      07/12/2017 - 07/14/2017 Hospital Admission    Admit date: 07/12/17 Admission diagnosis: GI Hemorrhaging  Additional comments: Colonoscopy and EGD done with no evidence of bleeding site. Given blood transfusion on 07/15/17    08/02/2017 Imaging    MRI Breast Bilateral 2/16//18 IMPRESSION: 1. The known lobular carcinoma of the right breast has not significantly changed since the prior MRI from 05/02/2017. The disease spans approximately 5.6 cm, previously 5.9 cm in the anterior to posterior dimension. 2. 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 3.  No new suspicious findings in either breast. 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    08/16/2017 -  Anti-estrogen oral therapy    Letrozole once daily starting 08/16/17     08/22/2017 Surgery     RIGHT MASTECTOMY WITH SENTINEL LYMPH NODE BIOPSY ERAS PATHWAY by Dr. Georgette Dover    08/22/2017 Pathology Results    See the oncology history under left breast cancer    09/25/2017 - 11/05/2017 Radiation Therapy    She has completed bilateral adjuvant radiation 09/25/17-11/05/17 managed by Dr. Isidore Moos. She tolerated well.    12/17/2017 Imaging    CT AP W Contrast 12/17/17 IMPRESSION: 1. Stable bilateral renal cysts. No change since prior examination and no worrisome CT imaging features. Stable lower pole right renal cyst. 2. No acute abdominal/pelvic findings or lymphadenopathy. 3. Stable small hepatic cysts. 4. Stable uterine fibroids.     Cancer of central portion of left breast ([place])   05/02/2017 Breast MRI    IMPRESSION: 1. Known right breast lobular carcinoma  spanning 5.9 x 3.2 x 5.6 cm, involving the upper outer and lower outer quadrants. 2. 4 mm enhancing mass with associated architectural distortion in the central left breast. This is suspicious for additional focus of carcinoma.    05/03/2017 Mammogram    IMPRESSION: 1. Suspicious area of architectural distortion in the left breast which corresponds to a small enhancing mass on MRI.  RECOMMENDATION: Stereotactic core needle biopsy of the area of architectural distortion in the left breast.     05/03/2017 Breast US    Stereotactic core needle biopsy of the area of architectural distortion in the left breast.    05/03/2017 Initial Biopsy    Breast, left, needle core biopsy, central, slightly toward the lower, outer quadrant - INVASIVE DUCTAL CARCINOMA, MSBR GRADE 1/2. - SEE MICROSCOPIC DESCRIPTION. ER 50% positive PR 90% positive Ki67: 1% HER2 negative     05/07/2017 Initial Diagnosis    Cancer of central portion of left breast ([place])    08/22/2017 Surgery    LEFT BREAST LUMPECTOMY WITH RADIOACTIVE SEED AND SENTINEL LYMPH NODE BIOPSY ERAS PATHWAY by Dr. Georgette Dover 08/22/17     08/22/2017 Pathology Results    Diagnosis 08/22/17 1. Breast, lumpectomy, Left - ATYPICAL DUCTAL HYPERPLASIA - FIBROCYSTIC CHANGES - PREVIOUS BIOPSY SITE CHANGES - SEE COMMENT 2. Lymph node, sentinel, biopsy, a  total of 4 nodes, all negative for malignant cells  6. Breast, simple mastectomy, Right - INVASIVE LOBULAR CARCINOMA, [place] GRADE 2/3, 6.0 CM - LOBULAR NEOPLASIA (ATYPICAL LOBULAR HYPERPLASIA) - PREVIOUS BIOPSY SITE CHANGES 7. One out of  6 SLN positive for malignant cells   Repeat HER2 IHC was negative (0) on sample 6     INTERVAL HISTORY:  Madison Stokes presents to the [facility] today for our initial meeting to review her survivorship care plan detailing her treatment course for breast cancer, as well as monitoring long-term side effects of that treatment, education regarding  health maintenance, screening, and overall wellness and health promotion.     Overall, Madison Stokes reports feeling quite well.  She is taking Letrozole daily and tells me she is doing fine on this.  She has some arthralgias that are intermittent and manageable, she denies vaginal dryness.  She has mild intermittent tolerable hot flashes.    Madison Stokes has noted a couple of bumps on her skin.  She notes a bump on her right inner knee that has been present for the past few weeks.  She notes the one on her throat resolved.  In her lower neck she feels like the skin has swollen in that area.  She is seeing her general practitioner at the end of this month.     REVIEW OF SYSTEMS:  Review of Systems  Constitutional: Negative for appetite change, chills, fatigue and fever.  HENT:   Negative for hearing loss, lump/mass and trouble swallowing.   Eyes: Negative for eye problems and icterus.  Respiratory: Negative for chest tightness, cough and shortness of breath.   Cardiovascular: Negative for chest pain, leg swelling and palpitations.  Gastrointestinal: Negative for abdominal distention, abdominal pain, constipation, diarrhea, nausea and vomiting.  Endocrine: Negative for hot flashes.  Skin: Negative for itching and rash.  Neurological: Negative for dizziness, extremity weakness, headaches and numbness.  Hematological: Negative for adenopathy. Does not bruise/bleed easily.  Psychiatric/Behavioral: Negative for depression. The patient is not nervous/anxious.   Breast: Denies any new nodularity, masses, tenderness, nipple changes, or nipple discharge.      ONCOLOGY TREATMENT TEAM:  1. Surgeon:  Dr. Georgette [facility] at [facility] 2. Medical Oncologist: Dr. Burr Medico  3. Radiation Oncologist: Dr. Isidore Moos    PAST MEDICAL/SURGICAL HISTORY:  Past Medical History:  Diagnosis Date  . Anemia    with chemo  . Cancer ([place]) 04/2017   Bil Breast Ca  . History of radiation therapy 09/25/17- 11/05/17   Left  Breast/ 50 Gy in 25 fractions, right breast/ 50 Gy in 25 fractions, right SCLV/ 45 Gy in 25 fractions, right breast boost/ 10 Gy in 5 fractions.   . Hyperlipidemia   . Hypertension   . PONV (postoperative nausea and vomiting)    nausea only   Past Surgical History:  Procedure Laterality Date  . BREAST LUMPECTOMY WITH RADIOACTIVE SEED AND SENTINEL LYMPH NODE BIOPSY Left 08/22/2017   Procedure: LEFT BREAST LUMPECTOMY WITH RADIOACTIVE SEED AND SENTINEL LYMPH NODE BIOPSY ERAS PATHWAY;  Surgeon: [name], MD;  Location: [place];  Service: General;  Laterality: Left;  PECTORAL BLOCK  . COLONOSCOPY WITH PROPOFOL Left 07/14/2017   Procedure: COLONOSCOPY WITH PROPOFOL;  Surgeon: Ronnette Juniper, MD;  Location: [facility];  Service: Gastroenterology;  Laterality: Left;  . DILATION AND CURETTAGE OF UTERUS    . ESOPHAGOGASTRODUODENOSCOPY N/A 07/13/2017   Procedure: ESOPHAGOGASTRODUODENOSCOPY (EGD);  Surgeon: Ronnette Juniper, MD;  Location: [facility];  Service: Gastroenterology;  Laterality:  N/A;  . MASTECTOMY W/ SENTINEL NODE BIOPSY Right 08/22/2017   Procedure: RIGHT MASTECTOMY WITH SENTINEL LYMPH NODE BIOPSY ERAS PATHWAY;  Surgeon: [name], MD;  Location: [place];  Service: General;  Laterality: Right;  PECTORAL BLOCK  . PORT-A-CATH REMOVAL Right 11/06/2017   Procedure: REMOVAL PORT-A-CATH;  Surgeon: [name], MD;  Location: [place];  Service: General;  Laterality: Right;  . PORTACATH PLACEMENT Right 05/14/2017   Procedure: ULTRASOUND GUIDED PORT PLACEMENT;  Surgeon: [name], MD;  Location: [place];  Service: General;  Laterality: Right;     ALLERGIES:  No Known Allergies   CURRENT MEDICATIONS:  Outpatient Encounter Medications as of 02/05/2018  Medication Sig  . aspirin EC 81 MG tablet Take 81 mg by mouth daily.  . Biotin 10 MG CAPS Take by mouth.  Marland Kitchen HYDROcodone-acetaminophen (NORCO/VICODIN) 5-325 MG tablet Take 1 tablet by mouth every 6 (six)  hours as needed for moderate pain.  Marland Kitchen irbesartan (AVAPRO) 150 MG tablet Take 150 mg by mouth daily.   Marland Kitchen letrozole (FEMARA) 2.5 MG tablet Take 1 tablet (2.5 mg total) by mouth daily.  Marland Kitchen loperamide (IMODIUM) 2 MG capsule Take 2-4 mg by mouth 4 (four) times daily as needed for diarrhea or loose stools.   Marland Kitchen loratadine (CLARITIN) 10 MG tablet Take 10 mg by mouth daily as needed for allergies.  . Multiple Vitamin (MULTIVITAMIN) tablet Take 1 tablet daily by mouth.  . prochlorperazine (COMPAZINE) 10 MG tablet Take 1 tablet (10 mg total) every 6 (six) hours as needed by mouth (Nausea or vomiting).  . simvastatin (ZOCOR) 40 MG tablet Take 40 mg at bedtime by mouth.  Marland Kitchen VITAMIN B COMPLEX-C PO Take by mouth.   No facility-administered encounter medications on file as of 02/05/2018.      ONCOLOGIC FAMILY HISTORY:  Family History  Problem Relation Age of Onset  . Cancer Mother 28       uterine  . Cancer Brother        prostate  . Heart disease Father   . Heart attack Father   . Diabetes Father   . Hypertension Father      GENETIC COUNSELING/TESTING: Not at this time  SOCIAL HISTORY:  Social History   Socioeconomic History  . Marital status: Married    Spouse name: Not on file  . Number of children: Not on file  . Years of education: Not on file  . Highest education level: Not on file  Occupational History  . Not on file  Social Needs  . Financial resource strain: Not on file  . Food insecurity:    Worry: Not on file    Inability: Not on file  . Transportation needs:    Medical: Not on file    Non-medical: Not on file  Tobacco Use  . Smoking status: Never Smoker  . Smokeless tobacco: Never Used  Substance and Sexual Activity  . Alcohol use: Yes    Comment: not recently  . Drug use: No  . Sexual activity: Not on file  Lifestyle  . Physical activity:    Days per week: Not on file    Minutes per session: Not on file  . Stress: Not on file  Relationships  . Social  connections:    Talks on phone: Not on file    Gets together: Not on file    Attends religious service: Not on file    Active member of club or organization: Not on file  Attends meetings of clubs or organizations: Not on file    Relationship status: Not on file  . Intimate partner violence:    Fear of current or ex partner: Not on file    Emotionally abused: Not on file    Physically abused: Not on file    Forced sexual activity: Not on file  Other Topics Concern  . Not on file  Social History Narrative  . Not on file      PHYSICAL EXAMINATION:  Vital Signs:   Vitals:   02/05/18 0949  BP: 139/88  Pulse: 75  Resp: 18  Temp: 97.9 F (36.6 C)  SpO2: 98%   Filed Weights   02/05/18 0949  Weight: 125 lb 1.6 oz (56.7 kg)   General: Well-nourished, well-appearing female in no acute distress.  She is unaccompanied today.   HEENT: Head is normocephalic.  Pupils equal and reactive to light. Conjunctivae clear without exudate.  Sclerae anicteric. Oral mucosa is pink, moist.  Oropharynx is pink without lesions or erythema.  Lymph: No cervical, supraclavicular, or infraclavicular lymphadenopathy noted on palpation.  Cardiovascular: Regular rate and rhythm.Marland Kitchen Respiratory: Clear to auscultation bilaterally. Chest expansion symmetric; breathing non-labored.  GI: Abdomen soft and round; non-tender, non-distended. Bowel sounds normoactive.  GU: Deferred.  Neuro: No focal deficits. Steady gait.  Psych: Mood and affect normal and appropriate for situation.  Extremities: No edema with exception of mild edema in right upper extremity MSK: No focal spinal tenderness to palpation.  Full range of motion in bilateral upper extremities Skin: Warm and dry.  LABORATORY DATA:  None for this visit.  DIAGNOSTIC IMAGING:  None for this visit.      ASSESSMENT AND PLAN:  Ms.. Stokes is a pleasant 72 y.o. 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  She presents to the Survivorship Clinic for our initial meeting and routine follow-up post-completion of treatment for breast cancer.    1. Stage IA right/left breast cancer:  Madison Stokes is continuing to recover from definitive treatment for breast cancer. She will follow-up with her medical oncologist, Dr. Burr Medico next month with history and physical exam per surveillance protocol.  She will continue her anti-estrogen therapy with Letrozole. Thus far, she is tolerating the Letrozole well, with minimal side effects.  Today, a comprehensive survivorship care plan and treatment summary was reviewed with the patient today detailing her breast cancer diagnosis, treatment course, potential late/long-term effects of treatment, appropriate follow-up care with recommendations for the future, and patient education resources.  A copy of this summary, along with a letter will be sent to the patient's primary care provider via mail/fax/In Basket message after today's visit.    2. Right arm swelling: Will order doppler and refer to PT.  The swelling is mild, however I think considering the amount of lymph nodes she has had removed in the right arm, it would be good for her to have evaluation and one on one teaching.    3. Bone health:  Given Madison Stokes's age/history of breast cancer and her current treatment regimen including anti-estrogen therapy with Letrozole, she is at risk for bone demineralization.  Her last DEXA scan showed osteopenia with a T score of -2 in the lumbar spine.  She does not take calcium or vitamin d supplementation.  She  will be due for repeat bone density in 2 years.  In the meantime, she was encouraged to increase her consumption of foods rich in calcium,  as well as increase her weight-bearing activities.  I reviewed this with Dr. Burr Medico who would like for Madison Stokes to receive Zometa.  I reviewed this with Madison Stokes including risks/benefits (osteonecrosis reviewed), and she is agreeable.  She will receive this next month after her appointment with Dr. Burr Medico.  She was given education on specific activities to promote bone health.  4. Cancer screening:  Due to Madison Stokes's history and her age, she should receive screening for skin cancers, colon cancer, and gynecologic cancers.  The information and recommendations are listed on the patient's comprehensive care plan/treatment summary and were reviewed in detail with the patient.    5. Health maintenance and wellness promotion: Madison Stokes was encouraged to consume 5-7 servings of fruits and vegetables per day. We reviewed the "Nutrition Rainbow" handout, as well as the handout "Take Control of Your Health and Reduce Your Cancer Risk" from the [place].  She was also encouraged to engage in moderate to vigorous exercise for 30 minutes per day most days of the week. We discussed the [employer] fitness program, which is designed for cancer survivors to help them become more physically fit after cancer treatments.  She was instructed to limit her alcohol consumption and continue to abstain from tobacco use.     6. Support services/counseling: It is not uncommon for this period of the patient's cancer care trajectory to be one of many emotions and stressors.  We discussed an opportunity for her to participate in the next session of [facility] ("Finding Your New Normal") support group series designed for patients after they have completed treatment.   Madison Stokes was encouraged to take advantage of our many other support services programs, support groups, and/or counseling in coping with her new life as a cancer survivor after completing anti-cancer treatment.  She was offered support today through active  listening and expressive supportive counseling.  She was given information regarding our available services and encouraged to contact me with any questions or for help enrolling in any of our support group/programs.   7. Areas of swelling in lower neck and knee: She is seeing her PCP later this month and I recommended she see them for further evaluation.     Dispo:   -Return to cancer center in one month for f/u with Dr. Burr Medico along with Zometa -Mammogram due in 02/2018 -Bone density due 01/2020 -She is welcome to return back to the Survivorship Clinic at any time; no additional follow-up needed at this time.  -Consider referral back to survivorship as a long-term survivor for continued surveillance  A total of (40) minutes of face-to-face time was spent with this patient with greater than 50% of that time in counseling and care-coordination.   [name], [place] [phone]   Note: PRIMARY CARE PROVIDER [name], [place] [phone]

## 2018-02-05 NOTE — Telephone Encounter (Signed)
Per 8/14 los (Madison Stokes), made Zometa infusion appt.  Gave patient avs and calendar.

## 2018-02-05 NOTE — Progress Notes (Signed)
REFERRING PROVIDER: [name], MD [address] [place], [place] [address]  PRIMARY PROVIDER:  [name], MD  PRIMARY REASON FOR VISIT:  1. Malignant neoplasm of overlapping sites of right breast in female, estrogen receptor positive ([place])   2. Family history of prostate cancer   3. Family history of uterine cancer   4. Cancer of central portion of left breast ([place])      HISTORY OF PRESENT ILLNESS:   Madison Stokes, a 72 y.o. female, was seen for a [facility] cancer genetics consultation at the request of Dr. Burr Medico due to a personal and family history of cancer.  Madison Stokes presents to clinic today to discuss the possibility of a hereditary predisposition to cancer, genetic testing, and to further clarify her future cancer risks, as well as potential cancer risks for family members.   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 This was treated with chemotherapy, right mastectomy and left lumpectomy, and radiation.  She is taking letrozole.     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 [facility]) Staging form: Breast, AJCC 8th Edition - Clinical stage from 05/03/2017: Stage IA (cT1a, cN0, cM0, G1, ER: Positive, PR: Positive, HER2: Negative) - Signed by [name], MD on 05/07/2017 - Pathologic stage from 08/22/2017: No Stage Recommended (ypT0, pN0, cM0, GX, ER: Unknown, PR: Unknown, HER2: Unknown) - Signed by [name], MD on 11/06/2017  Cancer of overlapping sites of right female breast [facility]) Staging form: Breast, AJCC 8th Edition - Clinical stage from 04/24/2017: Stage IIA (cT3, cN0, cM0, G2, ER+, PR+, HER2-) - Signed by [name], MD on 11/06/2017 - Pathologic stage from 08/22/2017: No Stage Recommended (ypT3, pN0(i+), cM0, G2, ER+, PR+, HER2-) - Signed by [name], MD on 10/29/2017       Cancer of overlapping sites of right female breast  ([place])   03/08/2017 Mammogram    IMPRESSION: 1. Patient has a is palpable mass in the lateral right breast, in the location dense fibroglandular breast tissue. Although no discrete mass is seen sonographically or mammographically, the Clinical change remains concerning. Biopsy is recommended.  RECOMMENDATION: 1. Ultrasound-guided core needle biopsy of palpable abnormality in the lateral right breast.    04/24/2017 Breast US    IMPRESSION: Ultrasound guided biopsy of the right breast. No apparent complications.    04/24/2017 Initial Biopsy    Breast, right, needle core biopsy, UOQ, centered at 9:30 o'clock - INVASIVE MAMMARY CARCINOMA. - SEE COMMENT. ER 100% positive PR 100% positive Ki67 10% HER2 FISH positive (ratio 2.10, copy number 3.30)  HER2 IHC was done on the biopsy sample after surgery, and was negative (1+). So the final HER2 is NEGATIVE (determined after surgery)    05/02/2017 Breast MRI    IMPRESSION: 1. 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 2. 4 mm enhancing mass with associated architectural distortion in the central left breast. This is suspicious for additional focus of carcinoma.    05/07/2017 Initial Diagnosis    Cancer of overlapping sites of right female breast ([place])    05/15/2017 Echocardiogram    ECHO 05/15/17  Impressions: - LVEF 60-65%, normal wall thickness, normal wall motion and GLS   strain, grade 1 DD, normal LV filling pressure, mild MR, normal   LA size, normal  IVC.       05/22/2017 Imaging    CT CAP W Contrast 05/22/17 IMPRESSION: 1. No specific CT findings of nodal or other metastatic disease in the chest, abdomen, and pelvis. 2. Bosniak category 54F cyst in the right mid kidney, with mild enhancement along a septation but without nodularity. Unless follow up abdominal scans related to the patient's breast cancer adequately characterize this lesion,  renal protocol MRI or CT scan would be recommended in 6 months time. 3. Other imaging findings of potential clinical significance: The Aortic Atherosclerosis (ICD10-I70.0). Ectatic ascending thoracic aorta without aneurysm. Small type 1 hiatal hernia. Several small hypodense liver lesions are likely benign although technically too small to characterize. Nonobstructive 1.2 cm right renal calculus. Anterior uterine fundal myometrial mass favoring fibroid. Pelvic floor laxity with small cystocele. Notable spondylosis at L3-4 and L5-S1.     05/22/2017 Imaging    Bone Scan Whole Body 05/22/17 IMPRESSION: 1. No findings of osseous metastatic disease. 2. Lower lumbar activity corresponds to areas of significant benign spondylosis on the CT scan.    05/24/2017 - 09/05/2017 Chemotherapy    TCHP every 3 weeks for 6 cycles starting 05/24/17 followed by a year of maintenance Herceptin and Perjeta. Given lack of response she stopped TCHP after 4 cycles on 07/26/17.  She will proceeded with Maintenance Herceptin and Perjeta on 08/16/17 and 09/05/17 only due to hesurgical smaple being HER2 negative      07/12/2017 - 07/14/2017 Hospital Admission    Admit date: 07/12/17 Admission diagnosis: GI Hemorrhaging  Additional comments: Colonoscopy and EGD done with no evidence of bleeding site. Given blood transfusion on 07/15/17    08/02/2017 Imaging    MRI Breast Bilateral 2/16//18 IMPRESSION: 1. The known lobular carcinoma of the right breast has not significantly changed since the prior MRI from 05/02/2017. The disease spans approximately 5.6 cm, previously 5.9 cm in the anterior to posterior dimension. 2. 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 3.  No new suspicious findings in either breast. 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    08/16/2017 -  Anti-estrogen oral therapy    Letrozole once daily starting 08/16/17     08/22/2017 Surgery     RIGHT MASTECTOMY WITH SENTINEL LYMPH NODE BIOPSY ERAS PATHWAY by Dr. Georgette Dover    08/22/2017 Pathology Results    See the oncology history under left breast cancer    09/25/2017 - 11/05/2017 Radiation Therapy    She has completed bilateral adjuvant radiation 09/25/17-11/05/17 managed by Dr. Isidore Moos. She tolerated well.    12/17/2017 Imaging    CT AP W Contrast 12/17/17 IMPRESSION: 1. Stable bilateral renal cysts. No change since prior examination and no worrisome CT imaging features. Stable lower pole right renal cyst. 2. No acute abdominal/pelvic findings or lymphadenopathy. 3. Stable small hepatic cysts. 4. Stable uterine fibroids.     Cancer of central portion of left breast ([place])   05/02/2017 Breast MRI    IMPRESSION: 1. 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 2. 4 mm enhancing mass with associated architectural distortion in the central left breast. This is suspicious for additional focus of carcinoma.    05/03/2017 Mammogram    IMPRESSION: 1. Suspicious area of architectural distortion in the left  breast which corresponds to a small enhancing mass on MRI.  RECOMMENDATION: Stereotactic core needle biopsy of the area of architectural distortion in the left breast.     05/03/2017 Breast US    Stereotactic core needle biopsy of the area of architectural distortion in the left breast.    05/03/2017 Initial Biopsy    Breast, left, needle core biopsy, central, slightly toward the lower, outer quadrant - INVASIVE DUCTAL CARCINOMA, MSBR GRADE 1/2. - SEE MICROSCOPIC DESCRIPTION. ER 50% positive PR 90% positive Ki67: 1% HER2 negative     05/07/2017 Initial Diagnosis    Cancer of central portion of left breast ([place])    08/22/2017 Surgery    LEFT  BREAST LUMPECTOMY WITH RADIOACTIVE SEED AND SENTINEL LYMPH NODE BIOPSY ERAS PATHWAY by Dr. Georgette Dover 08/22/17     08/22/2017 Pathology Results    Diagnosis 08/22/17 1. Breast, lumpectomy, Left - ATYPICAL DUCTAL HYPERPLASIA - FIBROCYSTIC CHANGES - PREVIOUS BIOPSY SITE CHANGES - SEE COMMENT 2. Lymph node, sentinel, biopsy, a total of 4 nodes, all negative for malignant cells  6. Breast, simple mastectomy, Right - INVASIVE LOBULAR CARCINOMA, [place] GRADE 2/3, 6.0 CM - LOBULAR NEOPLASIA (ATYPICAL LOBULAR HYPERPLASIA) - PREVIOUS BIOPSY SITE CHANGES 7. One out of  6 SLN positive for malignant cells   Repeat HER2 IHC was negative (0) on sample 6      HORMONAL RISK FACTORS:  Menarche was at age 68-13.  First live birth at age 38.  OCP use for approximately 10 years.  Ovaries intact: yes.  Hysterectomy: no.  Menopausal status: postmenopausal.  HRT use: 0 years. Colonoscopy: yes; normal. Mammogram within the last year: yes. Number of breast biopsies: 1. Up to date with pelvic exams:  n/a. Any excessive radiation exposure in the past:  no  Past Medical History:  Diagnosis Date  . Anemia    with chemo  . Cancer ([place]) 04/2017   Bil Breast Ca  . Family history of prostate cancer   . Family history of uterine cancer   . History of radiation therapy 09/25/17- 11/05/17   Left Breast/ 50 Gy in 25 fractions, right breast/ 50 Gy in 25 fractions, right SCLV/ 45 Gy in 25 fractions, right breast boost/ 10 Gy in 5 fractions.   . Hyperlipidemia   . Hypertension   . PONV (postoperative nausea and vomiting)    nausea only    Past Surgical History:  Procedure Laterality Date  . BREAST LUMPECTOMY WITH RADIOACTIVE SEED AND SENTINEL LYMPH NODE BIOPSY Left 08/22/2017   Procedure: LEFT BREAST LUMPECTOMY WITH RADIOACTIVE SEED AND SENTINEL LYMPH NODE BIOPSY ERAS PATHWAY;  Surgeon: [name], MD;  Location: [facility];  Service: General;  Laterality: Left;  PECTORAL BLOCK  . COLONOSCOPY WITH PROPOFOL  Left 07/14/2017   Procedure: COLONOSCOPY WITH PROPOFOL;  Surgeon: Ronnette Juniper, MD;  Location: [facility];  Service: Gastroenterology;  Laterality: Left;  . DILATION AND CURETTAGE OF UTERUS    . ESOPHAGOGASTRODUODENOSCOPY N/A 07/13/2017   Procedure: ESOPHAGOGASTRODUODENOSCOPY (EGD);  Surgeon: Ronnette Juniper, MD;  Location: [facility];  Service: Gastroenterology;  Laterality: N/A;  . MASTECTOMY W/ SENTINEL NODE BIOPSY Right 08/22/2017   Procedure: RIGHT MASTECTOMY WITH SENTINEL LYMPH NODE BIOPSY ERAS PATHWAY;  Surgeon: [name], MD;  Location: [facility];  Service: General;  Laterality: Right;  PECTORAL BLOCK  . PORT-A-CATH REMOVAL Right 11/06/2017   Procedure: REMOVAL PORT-A-CATH;  Surgeon: [name], MD;  Location: [facility];  Service: General;  Laterality: Right;  . PORTACATH PLACEMENT Right 05/14/2017  Procedure: ULTRASOUND GUIDED PORT PLACEMENT;  Surgeon: [name], MD;  Location: [facility];  Service: General;  Laterality: Right;    Social History   Socioeconomic History  . Marital status: Married    Spouse name: Not on file  . Number of children: Not on file  . Years of education: Not on file  . Highest education level: Not on file  Occupational History  . Not on file  Social Needs  . Financial resource strain: Not on file  . Food insecurity:    Worry: Not on file    Inability: Not on file  . Transportation needs:    Medical: Not on file    Non-medical: Not on file  Tobacco Use  . Smoking status: Never Smoker  . Smokeless tobacco: Never Used  Substance and Sexual Activity  . Alcohol use: Yes    Comment: not recently  . Drug use: No  . Sexual activity: Not on file  Lifestyle  . Physical activity:    Days per week: Not on file    Minutes per session: Not on file  . Stress: Not on file  Relationships  . Social connections:    Talks on phone: Not on file    Gets together: Not on file    Attends religious service: Not on file     Active member of club or organization: Not on file    Attends meetings of clubs or organizations: Not on file    Relationship status: Not on file  Other Topics Concern  . Not on file  Social History Narrative  . Not on file     FAMILY HISTORY:  We obtained a detailed, 4-generation family history.  Significant diagnoses are listed below: Family History  Problem Relation Age of Onset  . Cancer Mother 58       uterine; d. 58  . Prostate cancer Brother 50  . Heart disease Father   . Heart attack Father   . Diabetes Father   . Hypertension Father   . Bone cancer Maternal Uncle   . Heart attack Paternal Uncle   . Prostate cancer Maternal Grandfather   . Bone cancer Maternal Grandfather   . Bone cancer Maternal Uncle   . Bone cancer Cousin     The patient has two sons who are cancer free.  She has one brother who has prostate cancer.  Both parents are deceased.  Her mother had uterine cancer at 52 and her father died of a heart attack.  The patient's mother had two brothers and a sister.  Both brothers had bone cancer, as did a son of one of the brothers.  Both maternal grandparents are deceased.  The grandmother died of old age and the grandfather had prostate and bone cancer.  The patient's father had one brother who is cancer free.  The paternal grandparents are deceased.  Madison Stokes is unaware of previous family history of genetic testing for hereditary cancer risks. Patient's maternal ancestors are of Caucasian NOS descent, and paternal ancestors are of Caucasian NOS descent. There is no reported Ashkenazi Jewish ancestry. There is no known consanguinity.  GENETIC COUNSELING ASSESSMENT: Madison Stokes is a 72 y.o. female with a personal history of breast cancer and family history of prostate cancer which is somewhat suggestive of a hereditary breast and ovarian cancer and predisposition to cancer. We, therefore, discussed and recommended the following at today's visit.    DISCUSSION: We discussed that about 5-10% of breast  cancer are hereditary with most cases due to BRCA mutations.  There are other genes associated with hereditary breast cancer syndromes including ATM, CHEK2 and PALB2.  We reviewed the characteristics, features and inheritance patterns of hereditary cancer syndromes. We also discussed genetic testing, including the appropriate family members to test, the process of testing, insurance coverage and turn-around-time for results. We discussed the implications of a negative, positive and/or variant of uncertain significant result. We recommended Madison Stokes pursue genetic testing for the common hereditary cancer gene panel. The Hereditary Gene Panel offered by [facility] includes sequencing and/or deletion duplication testing of the following 47 genes: APC, ATM, AXIN2, BARD1, BMPR1A, BRCA1, BRCA2, BRIP1, CDH1, CDK4, CDKN2A (p14ARF), CDKN2A (p16INK4a), CHEK2, CTNNA1, DICER1, EPCAM (Deletion/duplication testing only), GREM1 (promoter region deletion/duplication testing only), KIT, MEN1, MLH1, MSH2, MSH3, MSH6, MUTYH, NBN, NF1, NHTL1, PALB2, PDGFRA, PMS2, POLD1, POLE, PTEN, RAD50, RAD51C, RAD51D, SDHB, SDHC, SDHD, SMAD4, SMARCA4. STK11, TP53, TSC1, TSC2, and VHL.  The following genes were evaluated for sequence changes only: SDHA and HOXB13 c.251G>A variant only.   Medicare guidelines for breast cancer include breast cancer diagnosed at any age with at least one close blood relatives with prostate cancer.  Based on Madison Stokes's personal and family history of cancer, she meets medical criteria for genetic testing. Despite that she meets criteria, she may still have an out of pocket cost. We discussed that if her out of pocket cost for testing is over $100, the laboratory will call and confirm whether she wants to proceed with testing.  If the out of pocket cost of testing is less than $100 she will be billed by the genetic testing laboratory.   PLAN: After considering  the risks, benefits, and limitations, Madison Stokes  provided informed consent to pursue genetic testing and the blood sample was sent to [facility] for analysis of the common hereditary cancer panel. Results should be available within approximately 2-3 weeks' time, at which point they will be disclosed by telephone to Madison Stokes, as will any additional recommendations warranted by these results. Madison Stokes will receive a summary of her genetic counseling visit and a copy of her results once available. This information will also be available in Epic. We encouraged Madison Stokes to remain in contact with cancer genetics annually so that we can continuously update the family history and inform her of any changes in cancer genetics and testing that may be of benefit for her family. Madison Stokes's questions were answered to her satisfaction today. Our contact information was provided should additional questions or concerns arise.  Lastly, we encouraged Madison Stokes to remain in contact with cancer genetics annually so that we can continuously update the family history and inform her of any changes in cancer genetics and testing that may be of benefit for this family.   Ms.  Stokes's questions were answered to her satisfaction today. Our contact information was provided should additional questions or concerns arise. Thank you for the referral and allowing [place] to share in the care of your patient.   Karen P. Florene Glen, [place], [facility] Certified Genetic Counselor [email or web address] phone: [phone]  The patient was seen for a total of 45 minutes in face-to-face genetic counseling.  This patient was discussed with Drs. Magrinat, Lindi Adie and/or Burr Medico who agrees with the above.    _______________________________________________________________________ For Office Staff:  Number of people involved in session: 1 Was an Intern/ student involved with case: no

## 2018-02-05 NOTE — Patient Instructions (Addendum)
Zoledronic Acid injection (Hypercalcemia, Oncology) What is this medicine? ZOLEDRONIC ACID (ZOE le dron ik AS id) lowers the amount of calcium loss from bone. It is used to treat too much calcium in your blood from cancer. It is also used to prevent complications of cancer that has spread to the bone. This medicine may be used for other purposes; ask your health care provider or pharmacist if you have questions. COMMON BRAND NAME(S): Zometa What should I tell my health care provider before I take this medicine? They need to know if you have any of these conditions: -aspirin-sensitive asthma -cancer, especially if you are receiving medicines used to treat cancer -dental disease or wear dentures -infection -kidney disease -receiving corticosteroids like dexamethasone or prednisone -an unusual or allergic reaction to zoledronic acid, other medicines, foods, dyes, or preservatives -pregnant or trying to get pregnant -breast-feeding How should I use this medicine? This medicine is for infusion into a vein. It is given by a health care professional in a hospital or clinic setting. Talk to your pediatrician regarding the use of this medicine in children. Special care may be needed. Overdosage: If you think you have taken too much of this medicine contact a poison control center or emergency room at once. NOTE: This medicine is only for you. Do not share this medicine with others. What if I miss a dose? It is important not to miss your dose. Call your doctor or health care professional if you are unable to keep an appointment. What may interact with this medicine? -certain antibiotics given by injection -NSAIDs, medicines for pain and inflammation, like ibuprofen or naproxen -some diuretics like bumetanide, furosemide -teriparatide -thalidomide This list may not describe all possible interactions. Give your health care provider a list of all the medicines, herbs, non-prescription drugs, or  dietary supplements you use. Also tell them if you smoke, drink alcohol, or use illegal drugs. Some items may interact with your medicine. What should I watch for while using this medicine? Visit your doctor or health care professional for regular checkups. It may be some time before you see the benefit from this medicine. Do not stop taking your medicine unless your doctor tells you to. Your doctor may order blood tests or other tests to see how you are doing. Women should inform their doctor if they wish to become pregnant or think they might be pregnant. There is a potential for serious side effects to an unborn child. Talk to your health care professional or pharmacist for more information. You should make sure that you get enough calcium and vitamin D while you are taking this medicine. Discuss the foods you eat and the vitamins you take with your health care professional. Some people who take this medicine have severe bone, joint, and/or muscle pain. This medicine may also increase your risk for jaw problems or a broken thigh bone. Tell your doctor right away if you have severe pain in your jaw, bones, joints, or muscles. Tell your doctor if you have any pain that does not go away or that gets worse. Tell your dentist and dental surgeon that you are taking this medicine. You should not have major dental surgery while on this medicine. See your dentist to have a dental exam and fix any dental problems before starting this medicine. Take good care of your teeth while on this medicine. Make sure you see your dentist for regular follow-up appointments. What side effects may I notice from receiving this medicine? Side effects that   you should report to your doctor or health care professional as soon as possible: -allergic reactions like skin rash, itching or hives, swelling of the face, lips, or tongue -anxiety, confusion, or depression -breathing problems -changes in vision -eye pain -feeling faint or  lightheaded, falls -jaw pain, especially after dental work -mouth sores -muscle cramps, stiffness, or weakness -redness, blistering, peeling or loosening of the skin, including inside the mouth -trouble passing urine or change in the amount of urine Side effects that usually do not require medical attention (report to your doctor or health care professional if they continue or are bothersome): -bone, joint, or muscle pain -constipation -diarrhea -fever -hair loss -irritation at site where injected -loss of appetite -nausea, vomiting -stomach upset -trouble sleeping -trouble swallowing -weak or tired This list may not describe all possible side effects. Call your doctor for medical advice about side effects. You may report side effects to FDA at 1-800-FDA-1088. Where should I keep my medicine? This drug is given in a hospital or clinic and will not be stored at home. NOTE: This sheet is a summary. It may not cover all possible information. If you have questions about this medicine, talk to your doctor, pharmacist, or health care provider.  2018 Elsevier/Gold Standard (2013-11-07 14:19:39)  Bone Health Bones protect organs, store calcium, and anchor muscles. Good health habits, such as eating nutritious foods and exercising regularly, are important for maintaining healthy bones. They can also help to prevent a condition that causes bones to lose density and become weak and brittle (osteoporosis). Why is bone mass important? Bone mass refers to the amount of bone tissue that you have. The higher your bone mass, the stronger your bones. An important step toward having healthy bones throughout life is to have strong and dense bones during childhood. A young adult who has a high bone mass is more likely to have a high bone mass later in life. Bone mass at its greatest it is called peak bone mass. A large decline in bone mass occurs in older adults. In women, it occurs about the time of  menopause. During this time, it is important to practice good health habits, because if more bone is lost than what is replaced, the bones will become less healthy and more likely to break (fracture). If you find that you have a low bone mass, you may be able to prevent osteoporosis or further bone loss by changing your diet and lifestyle. How can I find out if my bone mass is low? Bone mass can be measured with an X-ray test that is called a bone mineral density (BMD) test. This test is recommended for all women who are age 62 or older. It may also be recommended for men who are age 4 or older, or for people who are more likely to develop osteoporosis due to:  Having bones that break easily.  Having a long-term disease that weakens bones, such as kidney disease or rheumatoid arthritis.  Having menopause earlier than normal.  Taking medicine that weakens bones, such as steroids, thyroid hormones, or hormone treatment for breast cancer or prostate cancer.  Smoking.  Drinking three or more alcoholic drinks each day.  What are the nutritional recommendations for healthy bones? To have healthy bones, you need to get enough of the right minerals and vitamins. Most nutrition experts recommend getting these nutrients from the foods that you eat. Nutritional recommendations vary from person to person. Ask your health care provider what is healthy for you.  Here are some general guidelines. Calcium Recommendations Calcium is the most important (essential) mineral for bone health. Most people can get enough calcium from their diet, but supplements may be recommended for people who are at risk for osteoporosis. Good sources of calcium include:  Dairy products, such as low-fat or nonfat milk, cheese, and yogurt.  Dark green leafy vegetables, such as bok choy and broccoli.  Calcium-fortified foods, such as orange juice, cereal, bread, soy beverages, and tofu products.  Nuts, such as almonds.  Follow  these recommended amounts for daily calcium intake:  Children, age 78?3: 700 mg.  Children, age 66?8: 1,000 mg.  Children, age 65?13: 1,300 mg.  Teens, age 36?18: 1,300 mg.  Adults, age 52?50: 1,000 mg.  Adults, age 92?70: ? Men: 1,000 mg. ? Women: 1,200 mg.  Adults, age 89 or older: 1,200 mg.  Pregnant and breastfeeding females: ? Teens: 1,300 mg. ? Adults: 1,000 mg.  Vitamin D Recommendations Vitamin D is the most essential vitamin for bone health. It helps the body to absorb calcium. Sunlight stimulates the skin to make vitamin D, so be sure to get enough sunlight. If you live in a cold climate or you do not get outside often, your health care provider may recommend that you take vitamin D supplements. Good sources of vitamin D in your diet include:  Egg yolks.  Saltwater fish.  Milk and cereal fortified with vitamin D.  Follow these recommended amounts for daily vitamin D intake:  Children and teens, age 28?18: 80 international units.  Adults, age 28 or younger: 400-800 international units.  Adults, age 6 or older: 800-1,000 international units.  Other Nutrients Other nutrients for bone health include:  Phosphorus. This mineral is found in meat, poultry, dairy foods, nuts, and legumes. The recommended daily intake for adult men and adult women is 700 mg.  Magnesium. This mineral is found in seeds, nuts, dark green vegetables, and legumes. The recommended daily intake for adult men is 400?420 mg. For adult women, it is 310?320 mg.  Vitamin K. This vitamin is found in green leafy vegetables. The recommended daily intake is 120 mg for adult men and 90 mg for adult women.  What type of physical activity is best for building and maintaining healthy bones? Weight-bearing and strength-building activities are important for building and maintaining peak bone mass. Weight-bearing activities cause muscles and bones to work against gravity. Strength-building activities  increases muscle strength that supports bones. Weight-bearing and muscle-building activities include:  Walking and hiking.  Jogging and running.  Dancing.  Gym exercises.  Lifting weights.  Tennis and racquetball.  Climbing stairs.  Aerobics.  Adults should get at least 30 minutes of moderate physical activity on most days. Children should get at least 60 minutes of moderate physical activity on most days. Ask your health care provide what type of exercise is best for you. Where can I find more information? For more information, check out the following websites:  [place]: YardHomes.se  [employer] of Health: http://www.niams.AnonymousEar.fr.asp  This information is not intended to replace advice given to you by your health care provider. Make sure you discuss any questions you have with your health care provider. Document Released: 09/01/2003 Document Revised: 12/30/2015 Document Reviewed: 06/16/2014 Elsevier Interactive Patient Education  [employer].

## 2018-02-06 ENCOUNTER — Ambulatory Visit: Payer: Medicare Other | Attending: Adult Health | Admitting: Physical Therapy

## 2018-02-06 ENCOUNTER — Encounter: Payer: Self-pay | Admitting: Physical Therapy

## 2018-02-06 ENCOUNTER — Other Ambulatory Visit: Payer: Self-pay

## 2018-02-06 DIAGNOSIS — M25611 Stiffness of right shoulder, not elsewhere classified: Secondary | ICD-10-CM | POA: Insufficient documentation

## 2018-02-06 DIAGNOSIS — I972 Postmastectomy lymphedema syndrome: Secondary | ICD-10-CM | POA: Diagnosis not present

## 2018-02-06 DIAGNOSIS — M25612 Stiffness of left shoulder, not elsewhere classified: Secondary | ICD-10-CM | POA: Insufficient documentation

## 2018-02-06 DIAGNOSIS — R293 Abnormal posture: Secondary | ICD-10-CM

## 2018-02-06 LAB — CANCER ANTIGEN 27.29: CA 27.29: 84.7 U/mL — ABNORMAL HIGH (ref 0.0–38.6)

## 2018-02-06 NOTE — Therapy (Signed)
[place] [place], [place], [address] Phone: [phone]   Fax:  [phone]  Physical Therapy Evaluation  Patient Details  Name: Madison Stokes MRN: [id number] Date of Birth: 03-30-1946 Referring Provider: Wilber Bihari, NP   Encounter Date: 02/06/2018  PT End of Session - 02/06/18 1137    Visit Number  1    Number of Visits  8    Date for PT Re-Evaluation  03/06/18    PT Start Time  1005    PT Stop Time  1103    PT Time Calculation (min)  58 min    Activity Tolerance  Patient tolerated treatment well    Behavior During Therapy  [facility] for tasks assessed/performed       Past Medical History:  Diagnosis Date  . Anemia    with chemo  . Cancer ([place]) 04/2017   Bil Breast Ca  . Family history of prostate cancer   . Family history of uterine cancer   . History of radiation therapy 09/25/17- 11/05/17   Left Breast/ 50 Gy in 25 fractions, right breast/ 50 Gy in 25 fractions, right SCLV/ 45 Gy in 25 fractions, right breast boost/ 10 Gy in 5 fractions.   . Hyperlipidemia   . Hypertension   . PONV (postoperative nausea and vomiting)    nausea only    Past Surgical History:  Procedure Laterality Date  . BREAST LUMPECTOMY WITH RADIOACTIVE SEED AND SENTINEL LYMPH NODE BIOPSY Left 08/22/2017   Procedure: LEFT BREAST LUMPECTOMY WITH RADIOACTIVE SEED AND SENTINEL LYMPH NODE BIOPSY ERAS PATHWAY;  Surgeon: [name], MD;  Location: [facility];  Service: General;  Laterality: Left;  PECTORAL BLOCK  . COLONOSCOPY WITH PROPOFOL Left 07/14/2017   Procedure: COLONOSCOPY WITH PROPOFOL;  Surgeon: Ronnette Juniper, MD;  Location: [facility];  Service: Gastroenterology;  Laterality: Left;  . DILATION AND CURETTAGE OF UTERUS    . ESOPHAGOGASTRODUODENOSCOPY N/A 07/13/2017   Procedure: ESOPHAGOGASTRODUODENOSCOPY (EGD);  Surgeon: Ronnette Juniper, MD;  Location: [facility];  Service: Gastroenterology;  Laterality: N/A;  . MASTECTOMY W/  SENTINEL NODE BIOPSY Right 08/22/2017   Procedure: RIGHT MASTECTOMY WITH SENTINEL LYMPH NODE BIOPSY ERAS PATHWAY;  Surgeon: [name], MD;  Location: [facility];  Service: General;  Laterality: Right;  PECTORAL BLOCK  . PORT-A-CATH REMOVAL Right 11/06/2017   Procedure: REMOVAL PORT-A-CATH;  Surgeon: [name], MD;  Location: [facility];  Service: General;  Laterality: Right;  . PORTACATH PLACEMENT Right 05/14/2017   Procedure: ULTRASOUND GUIDED PORT PLACEMENT;  Surgeon: [name], MD;  Location: [facility];  Service: General;  Laterality: Right;    There were no vitals filed for this visit.   Subjective Assessment - 02/06/18 1012    Subjective  Patient reports in June 2019, she felt some edema in her right upper arm for unknown reasons. She reports discomfort in that area at night only.     Pertinent History  Neoadjuvant chemotherapy for bilateral breast cancer ended 2/19 ended early due to poor response. Right mastectomy for invasive lobular carcinoma with 1/6 positive axillary nodes on 08/22/17. Left lumpectomy with 0/4 positive nodes for atypical ductal hyperplasia on 08/22/17. Bilateral breast and right axillary radiation completed 11/05/17. GI bleed in 1/19 which has resolved.     Patient Stated Goals  Get rid of swelling    Currently in Pain?  No/denies         [facility] PT Assessment - 02/06/18 0001  Assessment   Medical Diagnosis  Right arm lymphedema    Referring Provider  Wilber Bihari, NP    Onset Date/Surgical Date  11/23/17    Hand Dominance  Right    Next MD Visit  03/21/18    Prior Therapy  none      Precautions   Precautions  Other (comment)    Precaution Comments  Bilateral UE lymphedema risk      Restrictions   Weight Bearing Restrictions  No      Balance Screen   Has the patient fallen in the past 6 months  No    Has the patient had a decrease in activity level because of a fear of falling?   No    Is the patient reluctant  to leave their home because of a fear of falling?   No      Home Film/video editor residence    Living Arrangements  Spouse/significant other    Available Help at Discharge  Family      Prior Function   Level of [place]  Retired    Leisure  Pension scheme manager   Overall Cognitive Status  Within Functional Limits for tasks assessed      Observation/Other Assessments   Observations  Edema present on right lateral trunk just inferior to axilla. Darkened skin present on left chest from radiation.      Posture/Postural Control   Posture/Postural Control  Postural limitations    Postural Limitations  Rounded Shoulders;Forward head      ROM / Strength   AROM / PROM / Strength  AROM;Strength      AROM   AROM Assessment Site  Shoulder;Cervical    Right/Left Shoulder  Right;Left    Right Shoulder Extension  35 Degrees    Right Shoulder Flexion  122 Degrees    Right Shoulder ABduction  142 Degrees    Right Shoulder Internal Rotation  72 Degrees    Right Shoulder External Rotation  43 Degrees    Left Shoulder Extension  50 Degrees    Left Shoulder Flexion  133 Degrees    Left Shoulder ABduction  122 Degrees    Left Shoulder Internal Rotation  57 Degrees    Left Shoulder External Rotation  87 Degrees    Cervical Flexion  WNL    Cervical Extension  WNL    Cervical - Right Side Bend  25% limited    Cervical - Left Side Bend  25% limited    Cervical - Right Rotation  WNL    Cervical - Left Rotation  WNL      Strength   Overall Strength Comments  BUE WNL        LYMPHEDEMA/ONCOLOGY QUESTIONNAIRE - 02/06/18 1038      Type   Cancer Type  Bilateral breast cancer      Surgeries   Mastectomy Date  08/22/17   Right side   Lumpectomy Date  08/22/17   Left arm   Sentinel Lymph Node Biopsy Date  08/22/17   4 on left; 6 on right   Number Lymph Nodes Removed  --   4 on left; 6 on right     Treatment   Active Chemotherapy  Treatment  No    Past Chemotherapy Treatment  Yes    Date  07/26/17    Active Radiation Treatment  No    Past Radiation Treatment  Yes  Date  11/05/17    Body Site  right chest and axilla; left breast    Current Hormone Treatment  Yes    Drug Name  Femara      What other symptoms do you have   Are you Having Heaviness or Tightness  No    Are you having Pain  No    Are you having pitting edema  No    Is it Hard or Difficult finding clothes that fit  No    Do you have infections  No    Is there Decreased scar mobility  Yes    Stemmer Sign  No      Lymphedema Assessments   Lymphedema Assessments  Upper extremities      Right Upper Extremity Lymphedema   At Axilla   28.3 cm    15 cm Proximal to Olecranon Process  27.9 cm    10 cm Proximal to Olecranon Process  26.2 cm    Olecranon Process  22.3 cm    15 cm Proximal to Ulnar Styloid Process  22.3 cm    10 cm Proximal to Ulnar Styloid Process  19.5 cm    Just Proximal to Ulnar Styloid Process  14.5 cm    Across Hand at [employer]  17.7 cm    At [place] of 2nd Digit  6 cm      Left Upper Extremity Lymphedema   At Axilla   28.3 cm    15 cm Proximal to Olecranon Process  27.5 cm    10 cm Proximal to Olecranon Process  25.5 cm    Olecranon Process  22.8 cm    15 cm Proximal to Ulnar Styloid Process  22.1 cm    10 cm Proximal to Ulnar Styloid Process  20 cm    Just Proximal to Ulnar Styloid Process  14.3 cm    Across Hand at [employer]  17.2 cm    At [place] of 2nd Digit  6 cm             Outpatient Re[facility]06/[facility]  Lymphedema Life Impact Scale Total Score  25 %      Objective measurements completed on examination: See above findings.      [facility] Adult PT Treatment/Exercise - 02/06/18 0001      Exercises   Exercises  Shoulder      Shoulder Exercises: Pulleys   Flexion  2 minutes    ABduction  2 minutes    ABduction Limitations  PT demo for flexion and  abduction and gave verbal cues for proper technique      Manual Therapy   Manual therapy comments  Applied compression foam in stockinette to right lateral trunk between bra and skin to reduce edema.             PT Education - 02/06/18 1136    Education Details  Educated pt briefly on lymphedema risk and treatment; educated her on HEP for shoulder ROM    Person(s) Educated  Patient    Methods  Explanation;Demonstration;Handout    Comprehension  Returned demonstration;Verbalized understanding          PT Long Term Goals - 02/06/18 1148      PT LONG TERM GOAL #1   Title  Patient will increase bilateral shoulder flexion to >/= 150 degrees for increased ease reaching overhead.    Time  4    Period  Weeks  Status  New      PT LONG TERM GOAL #2   Title  Patient will increase bilateral shoulder abduction to >/= 150 degrees for increased ease reaching overhead.    Time  4    Period  Weeks    Status  New      PT LONG TERM GOAL #3   Title  Patient will be independent with her home exercise program for shoulder ROM.    Time  4    Period  Weeks    Status  New      PT LONG TERM GOAL #4   Title  Patient will demonstrate proper technique for self manual lymph drainage.    Time  4    Period  Weeks    Status  New      PT LONG TERM GOAL #5   Title  Patient will verbalize good understanding of lymphedema risk reduction.    Time  4    Period  Weeks    Status  New             Plan - 02/06/18 1138    Clinical Impression Statement  Patient underwent neoadjuvant chemotherapy for bilateral breast cancer from approximately 11/18-2/19. Patient is now s/p left lumpectomy SLNB and right mastectomy SLNB with 1 positive right axillary node, performed on 08/22/17. She has completed radiation and recently noticed edema present on her right lateral trunk just inferior to her axilla. She did not do any exercises post op to restore ROM and now is moderately limited with shoulder ROM.  She will benefit from PT to restore shoulder ROM bilaterally, improve posture, and reduce edema on her right lateral trunk. She will also benefit from attending the ABC class for lymphedema education.    History and Personal Factors relevant to plan of care:  Recent GI bleed; at risk for BUE lymphedema    Clinical Presentation  Stable    Clinical Decision Making  Low    Rehab Potential  Excellent    Clinical Impairments Affecting Rehab Potential  None    PT Frequency  2x / week    PT Duration  4 weeks    PT Treatment/Interventions  ADLs/Self Care Home Management;Therapeutic activities;Therapeutic exercise;Manual techniques;Patient/family education;Manual lymph drainage;Scar mobilization;Passive range of motion    PT Next Visit Plan  Manual lymph drainage to right chest focused on lateral trunk; PROM bilateral shoulders with ROM exercises - pulleys, ball up wall, etc    PT Home Exercise Plan  Shoulder ROM HEP    Consulted and Agree with Plan of Care  Patient       Patient will benefit from skilled therapeutic intervention in order to improve the following deficits and impairments:  Decreased range of motion, Postural dysfunction, Decreased knowledge of use of DME, Increased fascial restricitons, Impaired UE functional use, Decreased scar mobility, Decreased knowledge of precautions  Visit Diagnosis: Stiffness of left shoulder, not elsewhere classified - Plan: PT plan of care cert/re-cert  Stiffness of right shoulder, not elsewhere classified - Plan: PT plan of care cert/re-cert  Postmastectomy lymphedema - Plan: PT plan of care cert/re-cert  Abnormal posture - Plan: PT plan of care cert/re-cert     Problem List Patient Active Problem List   Diagnosis Date Noted  . Family history of prostate cancer   . Family history of uterine cancer   . Carcinoma of upper-outer quadrant of right breast in female, estrogen receptor positive ([place]) 09/17/2017  . Bilateral breast cancer ([place])  08/22/2017  . Acute GI bleed 07/12/2017  . Acute blood loss anemia 07/12/2017  . HTN (hypertension) 07/12/2017  . Type 2 diabetes mellitus without complication ([place]) 69/62/9528  . Cancer of overlapping sites of right female breast ([place]) 05/07/2017  . Cancer of central portion of left breast [facility]) 05/07/2017   Annia Friendly, PT 02/06/18 11:58 AM  [place], [place], [address] Phone: [phone]   Fax:  [phone]  Name: Madison Stokes MRN: [id number] Date of Birth: 09-30-1945

## 2018-02-06 NOTE — Patient Instructions (Signed)
Patient was instructed today in a home exercise program today for post op shoulder range of motion. These included active assist shoulder flexion in sitting, scapular retraction, wall walking with shoulder abduction, and hands behind head external rotation.  She was encouraged to do these twice a day, holding 3 seconds and repeating 5 times.

## 2018-02-07 ENCOUNTER — Ambulatory Visit (HOSPITAL_COMMUNITY)
Admission: RE | Admit: 2018-02-07 | Discharge: 2018-02-07 | Disposition: A | Discharge status: Home / Self Care | Payer: Medicare Other | Source: Ambulatory Visit | Attending: Adult Health | Admitting: Adult Health

## 2018-02-07 DIAGNOSIS — C50112 Malignant neoplasm of central portion of left female breast: Secondary | ICD-10-CM | POA: Insufficient documentation

## 2018-02-07 DIAGNOSIS — M7989 Other specified soft tissue disorders: Secondary | ICD-10-CM | POA: Diagnosis not present

## 2018-02-07 NOTE — Progress Notes (Addendum)
Preliminary notes--Right upper extremity venous duplex exam completed. Negative for deep and superficial veins thrombosis.  Result e-faxed to [facility] (NP) right after exam done. Hongying Murlene Revell (RDMS RVT) 02/07/18 10:19 AM

## 2018-02-10 ENCOUNTER — Ambulatory Visit: Payer: Medicare Other

## 2018-02-10 DIAGNOSIS — M25612 Stiffness of left shoulder, not elsewhere classified: Secondary | ICD-10-CM

## 2018-02-10 DIAGNOSIS — I972 Postmastectomy lymphedema syndrome: Secondary | ICD-10-CM

## 2018-02-10 DIAGNOSIS — R293 Abnormal posture: Secondary | ICD-10-CM

## 2018-02-10 DIAGNOSIS — M25611 Stiffness of right shoulder, not elsewhere classified: Secondary | ICD-10-CM | POA: Diagnosis not present

## 2018-02-10 NOTE — Therapy (Signed)
[place], [place], [address] Phone: [phone]   Fax:  [phone]  Physical Therapy Treatment  Patient Details  Name: Madison Stokes MRN: [id number] Date of Birth: 05-Sep-1945 Referring Provider: Wilber Bihari, NP   Encounter Date: 02/10/2018  PT End of Session - 02/10/18 1104    Visit Number  2    Number of Visits  8    Date for PT Re-Evaluation  03/06/18    PT Start Time  1021    PT Stop Time  1102    PT Time Calculation (min)  41 min    Activity Tolerance  Patient tolerated treatment well    Behavior During Therapy  [facility] for tasks assessed/performed       Past Medical History:  Diagnosis Date  . Anemia    with chemo  . Cancer ([place]) 04/2017   Bil Breast Ca  . Family history of prostate cancer   . Family history of uterine cancer   . History of radiation therapy 09/25/17- 11/05/17   Left Breast/ 50 Gy in 25 fractions, right breast/ 50 Gy in 25 fractions, right SCLV/ 45 Gy in 25 fractions, right breast boost/ 10 Gy in 5 fractions.   . Hyperlipidemia   . Hypertension   . PONV (postoperative nausea and vomiting)    nausea only    Past Surgical History:  Procedure Laterality Date  . BREAST LUMPECTOMY WITH RADIOACTIVE SEED AND SENTINEL LYMPH NODE BIOPSY Left 08/22/2017   Procedure: LEFT BREAST LUMPECTOMY WITH RADIOACTIVE SEED AND SENTINEL LYMPH NODE BIOPSY ERAS PATHWAY;  Surgeon: [name], MD;  Location: [place];  Service: General;  Laterality: Left;  PECTORAL BLOCK  . COLONOSCOPY WITH PROPOFOL Left 07/14/2017   Procedure: COLONOSCOPY WITH PROPOFOL;  Surgeon: Ronnette Juniper, MD;  [facility];  Service: Gastroenterology;  Laterality: Left;  . DILATION AND CURETTAGE OF UTERUS    . ESOPHAGOGASTRODUODENOSCOPY N/A 07/13/2017   Procedure: ESOPHAGOGASTRODUODENOSCOPY (EGD);  Surgeon: Ronnette Juniper, MD;  Location: [facility];  Service: Gastroenterology;  Laterality: N/A;  . MASTECTOMY W/  SENTINEL NODE BIOPSY Right 08/22/2017   Procedure: RIGHT MASTECTOMY WITH SENTINEL LYMPH NODE BIOPSY ERAS PATHWAY;  Surgeon: [name], MD;  Location: [place];  Service: General;  Laterality: Right;  PECTORAL BLOCK  . PORT-A-CATH REMOVAL Right 11/06/2017   Procedure: REMOVAL PORT-A-CATH;  Surgeon: [name], MD;  Location: [place];  Service: General;  Laterality: Right;  . PORTACATH PLACEMENT Right 05/14/2017   Procedure: ULTRASOUND GUIDED PORT PLACEMENT;  Surgeon: [name], MD;  Location: [place];  Service: General;  Laterality: Right;    There were no vitals filed for this visit.  Subjective Assessment - 02/10/18 1022    Subjective  I've been wearing the compression foam in my bra that she gave me last time and that helps when I wear it. My Rt shoulder is starting to feel a little better the more I move it. The muscles are just stubborn.    Pertinent History  Neoadjuvant chemotherapy for bilateral breast cancer ended 2/19 ended early due to poor response. Right mastectomy for invasive lobular carcinoma with 1/6 positive axillary nodes on 08/22/17. Left lumpectomy with 0/4 positive nodes for atypical ductal hyperplasia on 08/22/17. Bilateral breast and right axillary radiation completed 11/05/17. GI bleed in 1/19 which has resolved.     Patient Stated Goals  Get rid of swelling    Currently in Pain?  No/denies  Outpatient Re[facility]  Lymphedema Life Impact Scale Total Score  25 %           [facility] Adult PT Treatment/Exercise - 02/10/18 0001      Shoulder Exercises: Pulleys   Flexion  2 minutes;Limitations    Flexion Limitations  Reviewed proper technique    ABduction  2 minutes;Limitations    ABduction Limitations  Reviewed proper technique      Shoulder Exercises: Therapy Ball   Flexion  Both;10 reps   Forward lean into end of stretch   ABduction   Right;Left;5 reps   Same side lean into end of stretch   ABduction Limitations  Pt returned therapist demo      Manual Therapy   Manual Therapy  Manual Lymphatic Drainage (MLD);Passive ROM;Myofascial release    Myofascial Release  To Rt axilla during P/ROM at area of tightness    Manual Lymphatic Drainage (MLD)  In Supine: Short neck, 5 diaphragmatic breaths, Rt inguinal nodes, Rt axillo-inguinal anastomosis, then focused on lateral trunk at area of swelling redirecting to pathway. beginning to instruct pt in principles and basics of anatomy of lymphatic system throughout    Passive ROM  In Supine into Rt shoulder flexion, abduction and er to pts tolerance                  PT Long Term Goals - 02/10/18 1216      PT LONG TERM GOAL #5   Title  Patient will verbalize good understanding of lymphedema risk reduction.    Baseline  Pt attended ABC class on 02/10/18    Status  Achieved            Plan - 02/10/18 1213    Clinical Impression Statement  Pt attended ABC class after session today so she has been educated in lymphedema risk reduction and infection prevention meeting that goal. Continued with AA/ROM exercises today which pt tolerated very well. Began manual therapy to her Rt UE including P/ROM, manual lymph drainage and myofascial release which she tolerated very well and reported feeling the stretching very beneficial.     Rehab Potential  Excellent    Clinical Impairments Affecting Rehab Potential  None    PT Frequency  2x / week    PT Duration  4 weeks    PT Treatment/Interventions  ADLs/Self Care Home Management;Therapeutic activities;Therapeutic exercise;Manual techniques;Patient/family education;Manual lymph drainage;Scar mobilization;Passive range of motion    PT Next Visit Plan  Manual lymph drainage to right chest focused on lateral trunk only using Rt axillo-inguinal anastomosis; PROM bilateral shoulders with ROM exercises - pulleys, ball up wall, etc     Consulted and Agree with Plan of Care  Patient       Patient will benefit from skilled therapeutic intervention in order to improve the following deficits and impairments:  Decreased range of motion, Postural dysfunction, Decreased knowledge of use of DME, Increased fascial restricitons, Impaired UE functional use, Decreased scar mobility, Decreased knowledge of precautions  Visit Diagnosis: Stiffness of left shoulder, not elsewhere classified  Stiffness of right shoulder, not elsewhere classified  Postmastectomy lymphedema  Abnormal posture     Problem List Patient Active Problem List   Diagnosis Date Noted  . Family history of prostate cancer   . Family history of uterine cancer   . Carcinoma of upper-outer quadrant of right breast in female, estrogen receptor positive ([place]) 09/17/2017  . Bilateral breast cancer ([place]) 08/22/2017  . Acute GI  bleed 07/12/2017  . Acute blood loss anemia 07/12/2017  . HTN (hypertension) 07/12/2017  . Type 2 diabetes mellitus without complication ([place]) 09/38/1829  . Cancer of overlapping sites of right female breast ([place]) 05/07/2017  . Cancer of central portion of left breast ([place]) 05/07/2017    Otelia Limes, PTA 02/10/2018, 12:17 PM  [place], [place], [address] Phone: [phone]   Fax:  [phone]  Name: Madison Stokes MRN: [id number] Date of Birth: 19-May-1946

## 2018-02-11 ENCOUNTER — Telehealth: Payer: Self-pay | Admitting: Emergency Medicine

## 2018-02-11 NOTE — Telephone Encounter (Addendum)
Pt verbalized understanding   ----- Message from [name], MD sent at 02/10/2018  7:42 AM EDT ----- Madison Stokes,  Please let pt know that her tumor marker CA27.29 is trending down, which is a good news, thanks   [name]  02/10/2018

## 2018-02-12 ENCOUNTER — Ambulatory Visit: Payer: Medicare Other

## 2018-02-12 DIAGNOSIS — M25611 Stiffness of right shoulder, not elsewhere classified: Secondary | ICD-10-CM

## 2018-02-12 DIAGNOSIS — M25612 Stiffness of left shoulder, not elsewhere classified: Secondary | ICD-10-CM

## 2018-02-12 DIAGNOSIS — I972 Postmastectomy lymphedema syndrome: Secondary | ICD-10-CM

## 2018-02-12 DIAGNOSIS — R293 Abnormal posture: Secondary | ICD-10-CM

## 2018-02-12 NOTE — Therapy (Signed)
[place], [place], [address] Phone: [phone]   Fax:  [phone]  Physical Therapy Treatment  Patient Details  Name: Madison Stokes MRN: [id number] Date of Birth: 07-19-1945 Referring Provider: Wilber Bihari, NP   Encounter Date: 02/12/2018  PT End of Session - 02/12/18 0911    Visit Number  3    Number of Visits  8    Date for PT Re-Evaluation  03/06/18    PT Start Time  0852    PT Stop Time  0936    PT Time Calculation (min)  44 min    Activity Tolerance  Patient tolerated treatment well    Behavior During Therapy  [facility] for tasks assessed/performed       Past Medical History:  Diagnosis Date  . Anemia    with chemo  . Cancer ([place]) 04/2017   Bil Breast Ca  . Family history of prostate cancer   . Family history of uterine cancer   . History of radiation therapy 09/25/17- 11/05/17   Left Breast/ 50 Gy in 25 fractions, right breast/ 50 Gy in 25 fractions, right SCLV/ 45 Gy in 25 fractions, right breast boost/ 10 Gy in 5 fractions.   . Hyperlipidemia   . Hypertension   . PONV (postoperative nausea and vomiting)    nausea only    Past Surgical History:  Procedure Laterality Date  . BREAST LUMPECTOMY WITH RADIOACTIVE SEED AND SENTINEL LYMPH NODE BIOPSY Left 08/22/2017   Procedure: LEFT BREAST LUMPECTOMY WITH RADIOACTIVE SEED AND SENTINEL LYMPH NODE BIOPSY ERAS PATHWAY;  Surgeon: [name], MD;  Location: [place];  Service: General;  Laterality: Left;  PECTORAL BLOCK  . COLONOSCOPY WITH PROPOFOL Left 07/14/2017   Procedure: COLONOSCOPY WITH PROPOFOL;  Surgeon: Ronnette Juniper, MD;  [facility];  Service: Gastroenterology;  Laterality: Left;  . DILATION AND CURETTAGE OF UTERUS    . ESOPHAGOGASTRODUODENOSCOPY N/A 07/13/2017   Procedure: ESOPHAGOGASTRODUODENOSCOPY (EGD);  Surgeon: Ronnette Juniper, MD;  Location: [facility];  Service: Gastroenterology;  Laterality: N/A;  . MASTECTOMY W/  SENTINEL NODE BIOPSY Right 08/22/2017   Procedure: RIGHT MASTECTOMY WITH SENTINEL LYMPH NODE BIOPSY ERAS PATHWAY;  Surgeon: [name], MD;  Location: [place];  Service: General;  Laterality: Right;  PECTORAL BLOCK  . PORT-A-CATH REMOVAL Right 11/06/2017   Procedure: REMOVAL PORT-A-CATH;  Surgeon: [name], MD;  Location: [place];  Service: General;  Laterality: Right;  . PORTACATH PLACEMENT Right 05/14/2017   Procedure: ULTRASOUND GUIDED PORT PLACEMENT;  Surgeon: [name], MD;  Location: [place];  Service: General;  Laterality: Right;    There were no vitals filed for this visit.  Subjective Assessment - 02/12/18 0853    Subjective  I've been doing the stretches at home and I can tell I'm using those muscles again! Just soreness though, no pain. Wore the sports bra all day yesterday and that really helps my swelling.    Pertinent History  Neoadjuvant chemotherapy for bilateral breast cancer ended 2/19 ended early due to poor response. Right mastectomy for invasive lobular carcinoma with 1/6 positive axillary nodes on 08/22/17. Left lumpectomy with 0/4 positive nodes for atypical ductal hyperplasia on 08/22/17. Bilateral breast and right axillary radiation completed 11/05/17. GI bleed in 1/19 which has resolved.     Patient Stated Goals  Get rid of swelling    Currently in Pain?  No/denies  Outpatient Re[facility] from [facility]06/[facility]  Lymphedema Life Impact Scale Total Score  25 %           [facility] Adult PT Treatment/Exercise - 02/12/18 0001      Shoulder Exercises: Supine   Horizontal ABduction  Strengthening;Both;10 reps;Theraband    Theraband Level (Shoulder Horizontal ABduction)  Level 1 (Yellow)    External Rotation  Strengthening;Both;10 reps;Theraband    Theraband Level (Shoulder External Rotation)  Level 1 (Yellow)    Flexion  Strengthening;Both;10 reps;Theraband    Narrow and Wide Grip, 10 times each   Theraband Level (Shoulder Flexion)  Level 1 (Yellow)    Diagonals  Strengthening;Right;Left;10 reps;Theraband    Theraband Level (Shoulder Diagonals)  Level 1 (Yellow)      Shoulder Exercises: Pulleys   Flexion  2 minutes    Flexion Limitations  VCs to relax shoulders    ABduction  2 minutes;Limitations    ABduction Limitations  Reminded pt to do at slower pace, holding stretch briefly      Shoulder Exercises: Therapy Ball   Flexion  Both;10 reps;Limitations   Forward lean into end of stretch   Flexion Limitations  Tactile cues to decrease scapular compensations    ABduction  Right;Left;5 reps   Same side lean into end of stretch     Manual Therapy   Manual Therapy  Manual Lymphatic Drainage (MLD);Passive ROM;Myofascial release    Myofascial Release  To Rt axilla during P/ROM at area of tightness    Manual Lymphatic Drainage (MLD)  --    Passive ROM  In Supine into Rt shoulder flexion, abduction and er to pts tolerance' then briefly to Lt             PT Education - 02/12/18 0908    Education Details  Supine scapular series with yellow theraband    Person(s) Educated  Patient    Methods  Explanation;Demonstration;Handout    Comprehension  Verbalized understanding;Returned demonstration          PT Long Term Goals - 02/10/18 1216      PT LONG TERM GOAL #5   Title  Patient will verbalize good understanding of lymphedema risk reduction.    Baseline  Pt attended ABC class on 02/10/18    Status  Achieved            Plan - 02/12/18 0912    Clinical Impression Statement  PT reports ABC class was beneficial and does not have any questions regarding lymphedmema risk reductions at this time.  Progressed her HEP to include supine scapular series with yellow theraband which she tolerated very well. Continued with manual therapy to increase end Rt shoulder ROM and decrease swelling at lateral trunk.    Rehab Potential  Excellent     Clinical Impairments Affecting Rehab Potential  None    PT Frequency  2x / week    PT Duration  4 weeks    PT Treatment/Interventions  ADLs/Self Care Home Management;Therapeutic activities;Therapeutic exercise;Manual techniques;Patient/family education;Manual lymph drainage;Scar mobilization;Passive range of motion    PT Next Visit Plan  Manual lymph drainage to right chest focused on lateral trunk only using Rt axillo-inguinal anastomosis; PROM bilateral shoulders with ROM exercises - pulleys, ball up wall, etc Assess HEP.    Consulted and Agree with Plan of Care  Patient       Patient will benefit from skilled therapeutic intervention in order to improve the following deficits and impairments:  Decreased range of motion, Postural  dysfunction, Decreased knowledge of use of DME, Increased fascial restricitons, Impaired UE functional use, Decreased scar mobility, Decreased knowledge of precautions  Visit Diagnosis: Stiffness of left shoulder, not elsewhere classified  Stiffness of right shoulder, not elsewhere classified  Postmastectomy lymphedema  Abnormal posture     Problem List Patient Active Problem List   Diagnosis Date Noted  . Family history of prostate cancer   . Family history of uterine cancer   . Carcinoma of upper-outer quadrant of right breast in female, estrogen receptor positive ([place]) 09/17/2017  . Bilateral breast cancer ([place]) 08/22/2017  . Acute GI bleed 07/12/2017  . Acute blood loss anemia 07/12/2017  . HTN (hypertension) 07/12/2017  . Type 2 diabetes mellitus without complication ([place]) 95/32/0233  . Cancer of overlapping sites of right female breast ([place]) 05/07/2017  . Cancer of central portion of left breast ([place]) 05/07/2017    Otelia Limes, PTA 02/12/2018, 9:37 AM  [place], [place], [address] Phone: [phone]   Fax:  [phone]  Name: Madison Stokes MRN: [id number] Date of Birth: May 21, 1946

## 2018-02-12 NOTE — Patient Instructions (Signed)

## 2018-02-17 ENCOUNTER — Ambulatory Visit: Payer: Medicare Other

## 2018-02-17 ENCOUNTER — Encounter: Payer: Self-pay | Admitting: Hematology

## 2018-02-17 DIAGNOSIS — I972 Postmastectomy lymphedema syndrome: Secondary | ICD-10-CM | POA: Diagnosis not present

## 2018-02-17 DIAGNOSIS — M25611 Stiffness of right shoulder, not elsewhere classified: Secondary | ICD-10-CM

## 2018-02-17 DIAGNOSIS — M25612 Stiffness of left shoulder, not elsewhere classified: Secondary | ICD-10-CM | POA: Diagnosis not present

## 2018-02-17 DIAGNOSIS — R293 Abnormal posture: Secondary | ICD-10-CM

## 2018-02-17 NOTE — Therapy (Signed)
[place], [place], [address] Phone: [phone]   Fax:  [phone]  Physical Therapy Treatment  Patient Details  Name: Madison Stokes MRN: [id number] Date of Birth: 11-06-1945 Referring Provider: Wilber Bihari, NP   Encounter Date: 02/17/2018  PT End of Session - 02/17/18 1106    Visit Number  4    Number of Visits  8    Date for PT Re-Evaluation  03/06/18    PT Start Time  1021    PT Stop Time  1104    PT Time Calculation (min)  43 min    Activity Tolerance  Patient tolerated treatment well    Behavior During Therapy  [facility] for tasks assessed/performed       Past Medical History:  Diagnosis Date  . Anemia    with chemo  . Cancer ([place]) 04/2017   Bil Breast Ca  . Family history of prostate cancer   . Family history of uterine cancer   . History of radiation therapy 09/25/17- 11/05/17   Left Breast/ 50 Gy in 25 fractions, right breast/ 50 Gy in 25 fractions, right SCLV/ 45 Gy in 25 fractions, right breast boost/ 10 Gy in 5 fractions.   . Hyperlipidemia   . Hypertension   . PONV (postoperative nausea and vomiting)    nausea only    Past Surgical History:  Procedure Laterality Date  . BREAST LUMPECTOMY WITH RADIOACTIVE SEED AND SENTINEL LYMPH NODE BIOPSY Left 08/22/2017   Procedure: LEFT BREAST LUMPECTOMY WITH RADIOACTIVE SEED AND SENTINEL LYMPH NODE BIOPSY ERAS PATHWAY;  Surgeon: [name], MD;  Location: [place];  Service: General;  Laterality: Left;  PECTORAL BLOCK  . COLONOSCOPY WITH PROPOFOL Left 07/14/2017   Procedure: COLONOSCOPY WITH PROPOFOL;  Surgeon: Ronnette Juniper, MD;  [facility];  Service: Gastroenterology;  Laterality: Left;  . DILATION AND CURETTAGE OF UTERUS    . ESOPHAGOGASTRODUODENOSCOPY N/A 07/13/2017   Procedure: ESOPHAGOGASTRODUODENOSCOPY (EGD);  Surgeon: Ronnette Juniper, MD;  Location: [facility];  Service: Gastroenterology;  Laterality: N/A;  . MASTECTOMY W/  SENTINEL NODE BIOPSY Right 08/22/2017   Procedure: RIGHT MASTECTOMY WITH SENTINEL LYMPH NODE BIOPSY ERAS PATHWAY;  Surgeon: [name], MD;  Location: [place];  Service: General;  Laterality: Right;  PECTORAL BLOCK  . PORT-A-CATH REMOVAL Right 11/06/2017   Procedure: REMOVAL PORT-A-CATH;  Surgeon: [name], MD;  Location: [place];  Service: General;  Laterality: Right;  . PORTACATH PLACEMENT Right 05/14/2017   Procedure: ULTRASOUND GUIDED PORT PLACEMENT;  Surgeon: [name], MD;  Location: [place];  Service: General;  Laterality: Right;    There were no vitals filed for this visit.  Subjective Assessment - 02/17/18 1023    Subjective  I've been working the exercises inot my day and I can tell my Rt shoulder is improving.     Pertinent History  Neoadjuvant chemotherapy for bilateral breast cancer ended 2/19 ended early due to poor response. Right mastectomy for invasive lobular carcinoma with 1/6 positive axillary nodes on 08/22/17. Left lumpectomy with 0/4 positive nodes for atypical ductal hyperplasia on 08/22/17. Bilateral breast and right axillary radiation completed 11/05/17. GI bleed in 1/19 which has resolved.     Patient Stated Goals  Get rid of swelling    Currently in Pain?  No/denies                  Outpatient Rehab from 02/06/2018 in [place]  Lymphedema Life  Impact Scale Total Score  25 %           [facility] Adult PT Treatment/Exercise - 02/17/18 0001      Shoulder Exercises: Pulleys   Flexion  2 minutes    Flexion Limitations  VCs to decrease Rt scapular compensation    ABduction  2 minutes    ABduction Limitations  VCs to decrease trunnk lean      Shoulder Exercises: Therapy Ball   Flexion  Both;10 reps   Forward lean into end of stretch   ABduction  Right;Left;5 reps   Same side lean into end of stretch     Manual Therapy   Myofascial Release  To Rt axilla during P/ROM at  area of tightness    Manual Lymphatic Drainage (MLD)  In Supine: Short neck, 5 diaphragmatic breaths, Rt inguinal nodes, Rt axillo-inguinal anastomosis, then focused on lateral trunk at area of swelling redirecting to pathway.    Passive ROM  In Supine into Rt shoulder flexion, abduction and er to pts tolerance                  PT Long Term Goals - 02/10/18 1216      PT LONG TERM GOAL #5   Title  Patient will verbalize good understanding of lymphedema risk reduction.    Baseline  Pt attended ABC class on 02/10/18    Status  Achieved            Plan - 02/17/18 1204    Clinical Impression Statement  Ms. Prochazka continues to note improvement from working exercises into her daily routine. Issued red theraband for her to progress to at home as she feels ready with supine scapular series as she reports already noticing some motions feeling easier. Her end ROM is improving and pt reports noticing this with her ADLs.     Rehab Potential  Excellent    Clinical Impairments Affecting Rehab Potential  None    PT Frequency  2x / week    PT Duration  4 weeks    PT Treatment/Interventions  ADLs/Self Care Home Management;Therapeutic activities;Therapeutic exercise;Manual techniques;Patient/family education;Manual lymph drainage;Scar mobilization;Passive range of motion    PT Next Visit Plan  PROM bilateral shoulders with ROM exercises - pulleys, ball up wall, etc. Assess HEP.    Consulted and Agree with Plan of Care  Patient       Patient will benefit from skilled therapeutic intervention in order to improve the following deficits and impairments:  Decreased range of motion, Postural dysfunction, Decreased knowledge of use of DME, Increased fascial restricitons, Impaired UE functional use, Decreased scar mobility, Decreased knowledge of precautions  Visit Diagnosis: Stiffness of left shoulder, not elsewhere classified  Stiffness of right shoulder, not elsewhere  classified  Postmastectomy lymphedema  Abnormal posture     Problem List Patient Active Problem List   Diagnosis Date Noted  . Family history of prostate cancer   . Family history of uterine cancer   . Carcinoma of upper-outer quadrant of right breast in female, estrogen receptor positive ([place]) 09/17/2017  . Bilateral breast cancer ([place]) 08/22/2017  . Acute GI bleed 07/12/2017  . Acute blood loss anemia 07/12/2017  . HTN (hypertension) 07/12/2017  . Type 2 diabetes mellitus without complication ([place]) 18/29/9371  . Cancer of overlapping sites of right female breast ([place]) 05/07/2017  . Cancer of central portion of left breast ([place]) 05/07/2017    Otelia Limes, PTA 02/17/2018, 12:10 PM  [facility]  [place], [place], [address] Phone: [phone]   Fax:  [phone]  Name: Madison Stokes MRN: [id number] Date of Birth: 1945/11/11

## 2018-02-18 ENCOUNTER — Telehealth: Payer: Self-pay | Admitting: Genetic Counselor

## 2018-02-18 ENCOUNTER — Encounter: Payer: Self-pay | Admitting: Genetic Counselor

## 2018-02-18 DIAGNOSIS — Z1379 Encounter for other screening for genetic and chromosomal anomalies: Secondary | ICD-10-CM | POA: Insufficient documentation

## 2018-02-18 NOTE — Telephone Encounter (Signed)
Revealed negative genetic testing.  Discussed that we do not know why she has breast cancer or why there is cancer in the family. It could be due to a different gene that we are not testing, or maybe our current technology may not be able to pick something up.  It will be important for her to keep in contact with genetics to keep up with whether additional testing may be needed.  Discussed that a VUS was identified.  We consider this normal, and would not change medical management based on this result.

## 2018-02-19 ENCOUNTER — Ambulatory Visit: Payer: Medicare Other

## 2018-02-19 DIAGNOSIS — M25611 Stiffness of right shoulder, not elsewhere classified: Secondary | ICD-10-CM | POA: Diagnosis not present

## 2018-02-19 DIAGNOSIS — I972 Postmastectomy lymphedema syndrome: Secondary | ICD-10-CM | POA: Diagnosis not present

## 2018-02-19 DIAGNOSIS — M25612 Stiffness of left shoulder, not elsewhere classified: Secondary | ICD-10-CM | POA: Diagnosis not present

## 2018-02-19 DIAGNOSIS — R293 Abnormal posture: Secondary | ICD-10-CM

## 2018-02-19 NOTE — Patient Instructions (Signed)
Start with circles near the neck, above the collarbones 10 times.  Cancer Rehab [phone] Deep Effective Breath   Standing, sitting, or laying down, place both hands on the belly. Take a deep breath IN, expanding the belly; then breath OUT, contracting the belly. Repeat __5__ times. Do __2-3__ sessions per day and before your self massage.  Axilla to Inguinal Nodes - Sweep   On involved side, make 5 circles at groin at panty line, then pump _5__ times from armpit along side of trunk to outer hip, making your pathway. Can stop at this point and focus on side of trunk swelling if no arm swelling.  Do __1_ time per day.  Copyright  VHI. All rights reserved.  Arm Posterior: Elbow to Shoulder - Sweep   Pump _5__ times from back of elbow to top of shoulder. Then inner to outer upper arm _5_ times, then outer arm again _5_ times. Then back to the pathway _2-3_ times. Do _1__ time per day.  Copyright  VHI. All rights reserved.  ARM: [place] Wrist to Elbow - Sweep   Pump or stationary circles _5__ times from wrist to elbow making sure to do both sides of the forearm. Then retrace your steps to the outer arm, and the pathway _2-3_ times each. Do _1__ time per day.  Copyright  VHI. All rights reserved.  ARM: Dorsum of Hand to Shoulder - Sweep   Pump or stationary circles _5__ times on back of hand including knuckle spaces and individual fingers if needed working up towards the wrist, then retrace all your steps working back up the forearm, doing both sides; upper outer arm and back to your pathway _2-3_ times each. Then do 5 circles again at involved groin where you started! Good job!! Do __1_ time per day.

## 2018-02-19 NOTE — Therapy (Addendum)
[place], [place], [address] Phone: [phone]   Fax:  [phone]  Physical Therapy Treatment  Patient Details  Name: Madison Stokes MRN: [id number] Date of Birth: May 25, 1946 Referring Provider: Wilber Bihari, NP   Encounter Date: 02/19/2018  PT End of Session - 02/19/18 1040    Visit Number  5    Number of Visits  8    Date for PT Re-Evaluation  03/06/18    PT Start Time  1022    PT Stop Time  1108    PT Time Calculation (min)  46 min    Activity Tolerance  Patient tolerated treatment well    Behavior During Therapy  [facility] for tasks assessed/performed       Past Medical History:  Diagnosis Date  . Anemia    with chemo  . Cancer ([place]) 04/2017   Bil Breast Ca  . Family history of prostate cancer   . Family history of uterine cancer   . History of radiation therapy 09/25/17- 11/05/17   Left Breast/ 50 Gy in 25 fractions, right breast/ 50 Gy in 25 fractions, right SCLV/ 45 Gy in 25 fractions, right breast boost/ 10 Gy in 5 fractions.   . Hyperlipidemia   . Hypertension   . PONV (postoperative nausea and vomiting)    nausea only    Past Surgical History:  Procedure Laterality Date  . BREAST LUMPECTOMY WITH RADIOACTIVE SEED AND SENTINEL LYMPH NODE BIOPSY Left 08/22/2017   Procedure: LEFT BREAST LUMPECTOMY WITH RADIOACTIVE SEED AND SENTINEL LYMPH NODE BIOPSY ERAS PATHWAY;  Surgeon: [name], MD;  Location: [place];  Service: General;  Laterality: Left;  PECTORAL BLOCK  . COLONOSCOPY WITH PROPOFOL Left 07/14/2017   Procedure: COLONOSCOPY WITH PROPOFOL;  Surgeon: Ronnette Juniper, MD;  Location: [facility];  Service: Gastroenterology;  Laterality: Left;  . DILATION AND CURETTAGE OF UTERUS    . ESOPHAGOGASTRODUODENOSCOPY N/A 07/13/2017   Procedure: ESOPHAGOGASTRODUODENOSCOPY (EGD);  Surgeon: Ronnette Juniper, MD;  Location: [facility];  Service: Gastroenterology;  Laterality: N/A;  . MASTECTOMY W/  SENTINEL NODE BIOPSY Right 08/22/2017   Procedure: RIGHT MASTECTOMY WITH SENTINEL LYMPH NODE BIOPSY ERAS PATHWAY;  Surgeon: [name], MD;  Location: [place];  Service: General;  Laterality: Right;  PECTORAL BLOCK  . PORT-A-CATH REMOVAL Right 11/06/2017   Procedure: REMOVAL PORT-A-CATH;  Surgeon: [name], MD;  Location: [place];  Service: General;  Laterality: Right;  . PORTACATH PLACEMENT Right 05/14/2017   Procedure: ULTRASOUND GUIDED PORT PLACEMENT;  Surgeon: [name], MD;  Location: [facility];  Service: General;  Laterality: Right;    There were no vitals filed for this visit.  Subjective Assessment - 02/19/18 1402    Subjective  I'm doing much better, don't really feel like I have any limitations and I want to make today my last visit.     Pertinent History  Neoadjuvant chemotherapy for bilateral breast cancer ended 2/19 ended early due to poor response. Right mastectomy for invasive lobular carcinoma with 1/6 positive axillary nodes on 08/22/17. Left lumpectomy with 0/4 positive nodes for atypical ductal hyperplasia on 08/22/17. Bilateral breast and right axillary radiation completed 11/05/17. GI bleed in 1/19 which has resolved.     Patient Stated Goals  Get rid of swelling    Currently in Pain?  No/denies         [facility] PT Assessment - 02/19/18 0001      AROM   Right Shoulder Extension  55 Degrees    Right Shoulder Flexion  146 Degrees    Right Shoulder ABduction  150 Degrees    Right Shoulder Internal Rotation  78 Degrees    Right Shoulder External Rotation  81 Degrees   pt reports this feeling much looser   Left Shoulder Flexion  146 Degrees    Left Shoulder ABduction  146 Degrees              [facility]patient Re[facility]06/[facility]  Lymphedema Life Impact Scale Total Score  25 %           [facility] Adult PT Treatment/Exercise - 02/19/18 0001      Shoulder Exercises: Standing    Other Standing Exercises  Reviewed all scapular series in standing with head/shoulders and back against wall and core engaged with red theraband 5 times each with pt returning therapist demo for all.      Shoulder Exercises: Pulleys   Flexion  2 minutes    Flexion Limitations  VCs to decrease Rt scapular compensation      Shoulder Exercises: Therapy Ball   Flexion  Both;10 reps   Forward lean into end of stretch   ABduction  Right;Left;10 reps   Same side lean into end of stretch     Manual Therapy   Manual Lymphatic Drainage (MLD)  In Supine: Short neck, 5 diaphragmatic breaths, Rt inguinal nodes, Rt axillo-inguinal anastomosis, then focused on lateral trunk at area of swelling redirecting to pathway, also briefly performed sequence to Rt UE instructing pt througout entirety of it and had her return demo, tactile cues/hand over hand pressure for correct pressure             PT Education - 02/19/18 1106    Education Details  Reviewed scapular series with red theraband; instructed pt in self MLD and had her return technique    Person(s) Educated  Patient    Methods  Explanation;Demonstration;Handout    Comprehension  Verbalized understanding;Returned demonstration          PT Long Term Goals - 02/19/18 1025      PT LONG TERM GOAL #1   Title  Patient will increase bilateral shoulder flexion to >/= 150 degrees for increased ease reaching overhead.    Baseline  bil 146 degrees but pt reports this feeling near her full ROM-02/19/18    Status  Partially Met      PT LONG TERM GOAL #2   Title  Patient will increase bilateral shoulder abduction to >/= 150 degrees for increased ease reaching overhead.    Baseline  Rt 150 and Lt 146 degrees, pt reports this motion feeling much looser than at eval- 02/19/18    Status  Partially Met      PT LONG TERM GOAL #3   Title  Patient will be independent with her home exercise program for shoulder ROM.    Status  Achieved      PT LONG  TERM GOAL #4   Title  Patient will demonstrate proper technique for self manual lymph drainage.    Status  Achieved      PT LONG TERM GOAL #5   Title  Patient will verbalize good understanding of lymphedema risk reduction.    Baseline  Pt attended ABC class on 02/10/18    Status  Achieved            Plan - 02/19/18 1041    Clinical Impression Statement  Pt has done  excellent with this episode of care and feels ready for D/C. She has met all goals except for ROM goals which she is only 4 degrees from reaching and she reports ROM feeling full. Instructed her how to progress scapular series at home so as she can continue with this for awhile to help her regain full ROM and scapular mobility. Also finalized instruction of self MLD. Pt returned good demo after multiple cuing.    Rehab Potential  Excellent    Clinical Impairments Affecting Rehab Potential  None    PT Frequency  2x / week    PT Duration  4 weeks    PT Treatment/Interventions  ADLs/Self Care Home Management;Therapeutic activities;Therapeutic exercise;Manual techniques;Patient/family education;Manual lymph drainage;Scar mobilization;Passive range of motion    PT Next Visit Plan  D/C this visit.    Consulted and Agree with Plan of Care  Patient       Patient will benefit from skilled therapeutic intervention in order to improve the following deficits and impairments:  Decreased range of motion, Postural dysfunction, Decreased knowledge of use of DME, Increased fascial restricitons, Impaired UE functional use, Decreased scar mobility, Decreased knowledge of precautions  Visit Diagnosis: Stiffness of left shoulder, not elsewhere classified  Stiffness of right shoulder, not elsewhere classified  Postmastectomy lymphedema  Abnormal posture     Problem List Patient Active Problem List   Diagnosis Date Noted  . Genetic testing 02/18/2018  . Family history of prostate cancer   . Family history of uterine cancer   .  Carcinoma of upper-outer quadrant of right breast in female, estrogen receptor positive ([place]) 09/17/2017  . Bilateral breast cancer ([place]) 08/22/2017  . Acute GI bleed 07/12/2017  . Acute blood loss anemia 07/12/2017  . HTN (hypertension) 07/12/2017  . Type 2 diabetes mellitus without complication ([place]) 32/07/3341  . Cancer of overlapping sites of right female breast ([place]) 05/07/2017  . Cancer of central portion of left breast ([place]) 05/07/2017    Golda Acre 02/19/2018, 2:04 PM  [place], [place], [address] Phone: [phone]   Fax:  [phone]  Name: Madison Stokes MRN: [id number] Date of Birth: 02-11-1946  PHYSICAL THERAPY DISCHARGE SUMMARY  Visits from Start of Care: 5  Current functional level related to goals / functional outcomes: Goals met except ROM goals which she is very close to achieving.   Remaining deficits: Slight limitation in shoulder ROM   Education / Equipment: HEP  Plan: Patient agrees to discharge.  Patient goals were partially met. Patient is being discharged due to being pleased with the current functional level.  ?????    Annia Friendly, [place] 02/19/18 2:04 PM

## 2018-02-20 ENCOUNTER — Ambulatory Visit: Payer: Self-pay | Admitting: Genetic Counselor

## 2018-02-20 DIAGNOSIS — Z8042 Family history of malignant neoplasm of prostate: Secondary | ICD-10-CM

## 2018-02-20 DIAGNOSIS — R7303 Prediabetes: Secondary | ICD-10-CM | POA: Diagnosis not present

## 2018-02-20 DIAGNOSIS — J301 Allergic rhinitis due to pollen: Secondary | ICD-10-CM | POA: Diagnosis not present

## 2018-02-20 DIAGNOSIS — Z1379 Encounter for other screening for genetic and chromosomal anomalies: Secondary | ICD-10-CM

## 2018-02-20 DIAGNOSIS — C50811 Malignant neoplasm of overlapping sites of right female breast: Secondary | ICD-10-CM | POA: Diagnosis not present

## 2018-02-20 DIAGNOSIS — E7800 Pure hypercholesterolemia, unspecified: Secondary | ICD-10-CM | POA: Diagnosis not present

## 2018-02-20 DIAGNOSIS — Z0000 Encounter for general adult medical examination without abnormal findings: Secondary | ICD-10-CM | POA: Diagnosis not present

## 2018-02-20 DIAGNOSIS — Z170 Estrogen receptor positive status [ER+]: Secondary | ICD-10-CM

## 2018-02-20 DIAGNOSIS — I700 Atherosclerosis of aorta: Secondary | ICD-10-CM | POA: Diagnosis not present

## 2018-02-20 DIAGNOSIS — I10 Essential (primary) hypertension: Secondary | ICD-10-CM | POA: Diagnosis not present

## 2018-02-20 DIAGNOSIS — Z8049 Family history of malignant neoplasm of other genital organs: Secondary | ICD-10-CM

## 2018-02-20 DIAGNOSIS — Z23 Encounter for immunization: Secondary | ICD-10-CM | POA: Diagnosis not present

## 2018-02-20 DIAGNOSIS — H903 Sensorineural hearing loss, bilateral: Secondary | ICD-10-CM | POA: Diagnosis not present

## 2018-02-20 NOTE — Progress Notes (Signed)
HPI:  Madison Stokes was previously seen in the [place] clinic due to a personal and family history of cancer and concerns regarding a hereditary predisposition to cancer. Please refer to our prior cancer genetics clinic note for more information regarding Madison Stokes's medical, social and family histories, and our assessment and recommendations, at the time. Madison Stokes's recent genetic test results were disclosed to her, as were recommendations warranted by these results. These results and recommendations are discussed in more detail below.  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 [facility]) Staging form: Breast, AJCC 8th Edition - Clinical stage from 05/03/2017: Stage IA (cT1a, cN0, cM0, G1, ER: Positive, PR: Positive, HER2: Negative) - Signed by [name], MD on 05/07/2017 - Pathologic stage from 08/22/2017: No Stage Recommended (ypT0, pN0, cM0, GX, ER: Unknown, PR: Unknown, HER2: Unknown) - Signed by [name], MD on 11/06/2017  Cancer of overlapping sites of right female breast [facility]) Staging form: Breast, AJCC 8th Edition - Clinical stage from 04/24/2017: Stage IIA (cT3, cN0, cM0, G2, ER+, PR+, HER2-) - Signed by [name], MD on 11/06/2017 - Pathologic stage from 08/22/2017: No Stage Recommended (ypT3, pN0(i+), cM0, G2, ER+, PR+, HER2-) - Signed by [name], MD on 10/29/2017       Cancer of overlapping sites of right female breast ([place])   03/08/2017 Mammogram    IMPRESSION: 1. Patient has a is palpable mass in the lateral right breast, in the location dense fibroglandular breast tissue. Although no discrete mass is seen sonographically or mammographically, the Clinical change remains concerning. Biopsy is recommended.  RECOMMENDATION: 1. Ultrasound-guided core needle biopsy of palpable abnormality in the lateral right breast.    04/24/2017 Breast US    IMPRESSION: Ultrasound guided biopsy of the right breast. No  apparent complications.    04/24/2017 Initial Biopsy    Breast, right, needle core biopsy, UOQ, centered at 9:30 o'clock - INVASIVE MAMMARY CARCINOMA. - SEE COMMENT. ER 100% positive PR 100% positive Ki67 10% HER2 FISH positive (ratio 2.10, copy number 3.30)  HER2 IHC was done on the biopsy sample after surgery, and was negative (1+). So the final HER2 is NEGATIVE (determined after surgery)    05/02/2017 Breast MRI    IMPRESSION: 1. 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 2. 4 mm enhancing mass with associated architectural distortion in the central left breast. This is suspicious for additional focus of carcinoma.    05/07/2017 Initial Diagnosis    Cancer of overlapping sites of right female breast ([place])    05/15/2017 Echocardiogram    ECHO 05/15/17  Impressions: - LVEF 60-65%, normal wall thickness, normal wall motion and GLS   strain, grade 1 DD, normal LV filling pressure, mild MR, normal   LA size, normal IVC.       05/22/2017 Imaging    CT CAP W Contrast 05/22/17 IMPRESSION: 1. No specific CT findings of nodal or other metastatic disease in the chest, abdomen, and pelvis. 2. Bosniak category 61F cyst in the right mid kidney, with mild enhancement along a septation but without nodularity. Unless follow up abdominal scans related to the patient's breast cancer adequately characterize this lesion, renal protocol MRI or CT scan would be recommended in 6 months time. 3. Other imaging findings of potential clinical significance: The Aortic Atherosclerosis (ICD10-I70.0). Ectatic ascending thoracic aorta without aneurysm. Small type 1 hiatal hernia.  Several small hypodense liver lesions are likely benign although technically too small to characterize. Nonobstructive 1.2 cm right renal calculus. Anterior uterine fundal myometrial mass favoring fibroid. Pelvic floor laxity with small cystocele.  Notable spondylosis at L3-4 and L5-S1.     05/22/2017 Imaging    Bone Scan Whole Body 05/22/17 IMPRESSION: 1. No findings of osseous metastatic disease. 2. Lower lumbar activity corresponds to areas of significant benign spondylosis on the CT scan.    05/24/2017 - 09/05/2017 Chemotherapy    TCHP every 3 weeks for 6 cycles starting 05/24/17 followed by a year of maintenance Herceptin and Perjeta. Given lack of response she stopped TCHP after 4 cycles on 07/26/17.  She will proceeded with Maintenance Herceptin and Perjeta on 08/16/17 and 09/05/17 only due to hesurgical smaple being HER2 negative      07/12/2017 - 07/14/2017 Hospital Admission    Admit date: 07/12/17 Admission diagnosis: GI Hemorrhaging  Additional comments: Colonoscopy and EGD done with no evidence of bleeding site. Given blood transfusion on 07/15/17    08/02/2017 Imaging    MRI Breast Bilateral 2/16//18 IMPRESSION: 1. The known lobular carcinoma of the right breast has not significantly changed since the prior MRI from 05/02/2017. The disease spans approximately 5.6 cm, previously 5.9 cm in the anterior to posterior dimension. 2. 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 3.  No new suspicious findings in either breast. 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    08/16/2017 -  Anti-estrogen oral therapy    Letrozole once daily starting 08/16/17     08/22/2017 Surgery     RIGHT MASTECTOMY WITH SENTINEL LYMPH NODE BIOPSY ERAS PATHWAY by Dr. Georgette Dover    08/22/2017 Pathology Results    See the oncology history under left breast cancer    09/25/2017 - 11/05/2017 Radiation Therapy    She has completed bilateral adjuvant radiation 09/25/17-11/05/17 managed by Dr. Isidore Moos. She tolerated well.    12/17/2017 Imaging    CT AP W  Contrast 12/17/17 IMPRESSION: 1. Stable bilateral renal cysts. No change since prior examination and no worrisome CT imaging features. Stable lower pole right renal cyst. 2. No acute abdominal/pelvic findings or lymphadenopathy. 3. Stable small hepatic cysts. 4. Stable uterine fibroids.    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  The Hereditary Gene Panel offered by [facility] includes sequencing and/or deletion duplication testing of the following 47 genes: APC, ATM, AXIN2, BARD1, BMPR1A, BRCA1, BRCA2, BRIP1, CDH1, CDK4, CDKN2A (p14ARF), CDKN2A (p16INK4a), CHEK2, CTNNA1, DICER1, EPCAM (Deletion/duplication testing only), GREM1 (promoter region deletion/duplication testing only), KIT, MEN1, MLH1, MSH2, MSH3, MSH6, MUTYH, NBN, NF1, NHTL1, PALB2, PDGFRA, PMS2, POLD1, POLE, PTEN, RAD50, RAD51C, RAD51D, SDHB, SDHC, SDHD, SMAD4, SMARCA4. STK11, TP53, TSC1, TSC2, and VHL.  The following genes were evaluated for sequence changes only: SDHA and HOXB13 c.251G>A variant only. The report date is February 15, 2018.     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 2. 4 mm enhancing mass with associated architectural distortion in the central left breast. This is suspicious for additional focus of carcinoma.    05/03/2017  Mammogram    IMPRESSION: 1. Suspicious area of architectural distortion in the left breast which corresponds to a small enhancing mass on MRI.  RECOMMENDATION: Stereotactic core needle biopsy of the area of architectural distortion in the left breast.     05/03/2017 Breast US    Stereotactic core needle biopsy of the area of architectural distortion in the left breast.    05/03/2017 Initial Biopsy    Breast, left, needle core biopsy, central, slightly  toward the lower, outer quadrant - INVASIVE DUCTAL CARCINOMA, MSBR GRADE 1/2. - SEE MICROSCOPIC DESCRIPTION. 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 Georgette Dover 08/22/17     08/22/2017 Pathology Results    Diagnosis 08/22/17 1. Breast, lumpectomy, Left - ATYPICAL DUCTAL HYPERPLASIA - FIBROCYSTIC CHANGES - PREVIOUS BIOPSY SITE CHANGES - SEE COMMENT 2. Lymph node, sentinel, biopsy, a total of 4 nodes, all negative for malignant cells  6. Breast, simple mastectomy, Right - INVASIVE LOBULAR CARCINOMA, [place] GRADE 2/3, 6.0 CM - LOBULAR NEOPLASIA (ATYPICAL LOBULAR HYPERPLASIA) - PREVIOUS BIOPSY SITE CHANGES 7. 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  The Hereditary Gene Panel offered by [facility] includes sequencing and/or deletion duplication testing of the following 47 genes: APC, ATM, AXIN2, BARD1, BMPR1A, BRCA1, BRCA2, BRIP1, CDH1, CDK4, CDKN2A (p14ARF), CDKN2A (p16INK4a), CHEK2, CTNNA1, DICER1, EPCAM (Deletion/duplication testing only), GREM1 (promoter region deletion/duplication testing only), KIT, MEN1, MLH1, MSH2, MSH3, MSH6, MUTYH, NBN, NF1, NHTL1, PALB2, PDGFRA, PMS2, POLD1, POLE, PTEN, RAD50, RAD51C, RAD51D, SDHB, SDHC, SDHD, SMAD4, SMARCA4. STK11, TP53, TSC1, TSC2, and VHL.  The following genes were evaluated for sequence changes only: SDHA and HOXB13 c.251G>A variant only. The report date is February 15, 2018.     FAMILY HISTORY:  We obtained a detailed, 4-generation family history.  Significant diagnoses are listed below: Family History  Problem Relation Age of Onset  . Cancer Mother 51        uterine; d. 34  . Prostate cancer Brother 59  . Heart disease Father   . Heart attack Father   . Diabetes Father   . Hypertension Father   . Bone cancer Maternal Uncle   . Heart attack Paternal Uncle   . Prostate cancer Maternal Grandfather   . Bone cancer Maternal Grandfather   . Bone cancer Maternal Uncle   . Bone cancer Cousin     The patient has two sons who are cancer free.  She has one brother who has prostate cancer.  Both parents are deceased.  Her mother had uterine cancer at 52 and her father died of a heart attack.  The patient's mother had two brothers and a sister.  Both brothers had bone cancer, as did a son of one of the brothers.  Both maternal grandparents are deceased.  The grandmother died of old age and the grandfather had prostate and bone cancer.  The patient's father had one brother who is cancer free.  The paternal grandparents are deceased.  Madison Stokes is unaware of previous family history of genetic testing for hereditary cancer risks. Patient's maternal ancestors are  of Caucasian NOS descent, and paternal ancestors are of Caucasian NOS descent. There is no reported Ashkenazi Jewish ancestry. There is no known consanguinity.  GENETIC TEST RESULTS: Genetic testing reported out on February 15, 2018 through the Common Hereditary cancer panel found no deleterious mutations.  The Hereditary Gene Panel offered by [facility] includes sequencing and/or deletion duplication testing of the following 47 genes: APC, ATM, AXIN2, BARD1, BMPR1A, BRCA1, BRCA2, BRIP1, CDH1, CDK4, CDKN2A (p14ARF), CDKN2A (p16INK4a), CHEK2, CTNNA1, DICER1, EPCAM (Deletion/duplication testing only), GREM1 (promoter region deletion/duplication testing only), KIT, MEN1, MLH1, MSH2, MSH3, MSH6, MUTYH, NBN, NF1, NHTL1, PALB2, PDGFRA, PMS2, POLD1, POLE, PTEN, RAD50, RAD51C, RAD51D, SDHB, SDHC, SDHD, SMAD4, SMARCA4. STK11, TP53, TSC1, TSC2, and VHL.  The following genes were evaluated for sequence changes only:  SDHA and HOXB13 c.251G>A variant only.  The test report has been scanned into EPIC and is located under the Molecular Pathology section of the Results Review tab.    We discussed with Madison Stokes that since the current genetic testing is not perfect, it is possible there may be a gene mutation in one of these genes that current testing cannot detect, but that chance is small.  We also discussed, that it is possible that another gene that has not yet been discovered, or that we have not yet tested, is responsible for the cancer diagnoses in the family, and it is, therefore, important to remain in touch with cancer genetics in the future so that we can continue to offer Madison Stokes the most up to date genetic testing.   Genetic testing did detect a Variant of Unknown Significance in the RAD51C gene called c.1043A>T (p.Asp348Val). At this time, it is unknown if this variant is associated with increased cancer risk or if this is a normal finding, but most variants such as this get reclassified to being inconsequential. It should not be used to make medical management decisions. With time, we suspect the lab will determine the significance of this variant, if any. If we do learn more about it, we will try to contact Madison Stokes to discuss it further. However, it is important to stay in touch with [place] periodically and keep the address and phone number up to date.    CANCER SCREENING RECOMMENDATIONS: This result is reassuring and indicates that Madison Stokes likely does not have an increased risk for a future cancer due to a mutation in one of these genes. This normal test also suggests that Madison Stokes's cancer was most likely not due to an inherited predisposition associated with one of these genes.  Most cancers happen by chance and this negative test suggests that her cancer falls into this category.  We, therefore, recommended she continue to follow the cancer management and screening guidelines provided by her  oncology and primary healthcare provider.   An individual's cancer risk and medical management are not determined by genetic test results alone. Overall cancer risk assessment incorporates additional factors, including personal medical history, family history, and any available genetic information that may result in a personalized plan for cancer prevention and surveillance.  RECOMMENDATIONS FOR FAMILY MEMBERS:  Women in this family might be at some increased risk of developing cancer, over the general population risk, simply due to the family history of cancer.  We recommended women in this family have a yearly mammogram beginning at age 8, or 29 years younger than the earliest onset of cancer, an annual clinical breast exam, and perform monthly breast self-exams. Women in this family should  also have a gynecological exam as recommended by their primary provider. All family members should have a colonoscopy by age 64.  FOLLOW-UP: Lastly, we discussed with Madison Stokes that cancer genetics is a rapidly advancing field and it is possible that new genetic tests will be appropriate for her and/or her family members in the future. We encouraged her to remain in contact with cancer genetics on an annual basis so we can update her personal and family histories and let her know of advances in cancer genetics that may benefit this family.   Our contact number was provided. Madison Stokes's questions were answered to her satisfaction, and she knows she is welcome to call us at anytime with additional questions or concerns.   Roma Kayser, MS, [facility] Certified Genetic Counselor [email or web address]

## 2018-03-11 DIAGNOSIS — H903 Sensorineural hearing loss, bilateral: Secondary | ICD-10-CM | POA: Diagnosis not present

## 2018-03-18 NOTE — Progress Notes (Signed)
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 [facility]) Staging form: Breast, AJCC 8th Edition - Clinical stage from 05/03/2017: Stage IA (cT1a, cN0, cM0, G1, ER: Positive, PR: Positive, HER2: Negative) - Signed by [name], MD on 05/07/2017 - Pathologic stage from 08/22/2017: No Stage Recommended (ypT0, pN0, cM0, GX, ER: Unknown, PR: Unknown, HER2: Unknown) - Signed by [name], MD on 11/06/2017  Cancer of overlapping sites of right female breast [facility]) Staging form: Breast, AJCC 8th Edition - Clinical stage from 04/24/2017: Stage IIA (cT3, cN0, cM0, G2, ER+, PR+, HER2-) - Signed by [name], MD on 11/06/2017 - Pathologic stage from 08/22/2017: No Stage Recommended (ypT3, pN0(i+), cM0, G2, ER+, PR+, HER2-) - Signed by [name], MD on 10/29/2017       Cancer of overlapping sites of right female breast ([place])   03/08/2017 Mammogram    IMPRESSION: 1. Patient has a is palpable mass in the lateral right breast, in the location dense fibroglandular breast tissue. Although no discrete mass is seen sonographically or mammographically, the Clinical change remains concerning. Biopsy is recommended.  RECOMMENDATION: 1. Ultrasound-guided core needle biopsy of palpable abnormality in the lateral right breast.    04/24/2017 Breast US    IMPRESSION: Ultrasound guided biopsy of the right breast. No apparent complications.    04/24/2017 Initial Biopsy    Breast,  right, needle core biopsy, UOQ, centered at 9:30 o'clock - INVASIVE MAMMARY CARCINOMA. - SEE COMMENT. ER 100% positive PR 100% positive Ki67 10% HER2 FISH positive (ratio 2.10, copy number 3.30)  HER2 IHC was done on the biopsy sample after surgery, and was negative (1+). So the final HER2 is NEGATIVE (determined after surgery)    05/02/2017 Breast MRI    IMPRESSION: 1. 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 2. 4 mm enhancing mass with associated architectural distortion in the central left breast. This is suspicious for additional focus of carcinoma.    05/07/2017 Initial Diagnosis    Cancer of overlapping sites of right female breast ([place])    05/15/2017 Echocardiogram    ECHO 05/15/17  Impressions: - LVEF 60-65%, normal wall thickness, normal wall motion and GLS   strain, grade 1 DD, normal LV filling pressure, mild MR, normal   LA size, normal IVC.       05/22/2017 Imaging    CT CAP W Contrast 05/22/17 IMPRESSION: 1. No specific CT findings of nodal or other metastatic disease in the chest, abdomen, and pelvis. 2. Bosniak category 35F cyst in the right mid kidney, with mild enhancement along a septation but without nodularity. Unless follow up abdominal scans related to the patient's breast cancer adequately characterize this lesion, renal protocol MRI or CT scan would be recommended in 6 months time. 3. Other imaging findings of potential clinical significance: The Aortic Atherosclerosis (ICD10-I70.0). Ectatic ascending thoracic aorta without aneurysm. Small type 1 hiatal hernia. Several small hypodense liver lesions are likely benign although  technically too small to characterize. Nonobstructive 1.2 cm right renal calculus. Anterior uterine fundal myometrial mass favoring fibroid. Pelvic floor laxity with small cystocele. Notable spondylosis at L3-4 and L5-S1.     05/22/2017 Imaging     Bone Scan Whole Body 05/22/17 IMPRESSION: 1. No findings of osseous metastatic disease. 2. Lower lumbar activity corresponds to areas of significant benign spondylosis on the CT scan.    05/24/2017 - 09/05/2017 Chemotherapy    TCHP every 3 weeks for 6 cycles starting 05/24/17 followed by a year of maintenance Herceptin and Perjeta. Given lack of response she stopped TCHP after 4 cycles on 07/26/17.  She will proceeded with Maintenance Herceptin and Perjeta on 08/16/17 and 09/05/17 only due to hesurgical smaple being HER2 negative      07/12/2017 - 07/14/2017 Hospital Admission    Admit date: 07/12/17 Admission diagnosis: GI Hemorrhaging  Additional comments: Colonoscopy and EGD done with no evidence of bleeding site. Given blood transfusion on 07/15/17    08/02/2017 Imaging    MRI Breast Bilateral 2/16//18 IMPRESSION: 1. The known lobular carcinoma of the right breast has not significantly changed since the prior MRI from 05/02/2017. The disease spans approximately 5.6 cm, previously 5.9 cm in the anterior to posterior dimension. 2. 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 3.  No new suspicious findings in either breast. 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    08/16/2017 -  Anti-estrogen oral therapy    Letrozole once daily starting 08/16/17     08/22/2017 Surgery     RIGHT MASTECTOMY WITH SENTINEL LYMPH NODE BIOPSY ERAS PATHWAY by Dr. Georgette Dover    08/22/2017 Pathology Results    See the oncology history under left breast cancer    09/25/2017 - 11/05/2017 Radiation Therapy    She has completed bilateral adjuvant radiation 09/25/17-11/05/17 managed by Dr. Isidore Moos. She tolerated well.    12/17/2017 Imaging    CT AP W Contrast 12/17/17 IMPRESSION: 1. Stable bilateral renal cysts. No  change since prior examination and no worrisome CT imaging features. Stable lower pole right renal cyst. 2. No acute abdominal/pelvic findings or lymphadenopathy. 3. Stable small hepatic cysts. 4. Stable uterine fibroids.    01/22/2018 PET scan    PET 01/22/18 IMPRESSION: 1. Status post right mastectomy and left breast lumpectomy. 2. Minimal left axillary scarring changes near a surgical clip. No findings suspicious for chest wall tumor or axillary or supraclavicular adenopathy. 3. 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  The Hereditary Gene Panel offered by [facility] includes sequencing and/or deletion duplication testing of the following 47 genes: APC, ATM, AXIN2, BARD1, BMPR1A, BRCA1, BRCA2, BRIP1, CDH1, CDK4, CDKN2A (p14ARF), CDKN2A (p16INK4a), CHEK2, CTNNA1, DICER1, EPCAM (Deletion/duplication testing only), GREM1 (promoter region deletion/duplication testing only), KIT, MEN1, MLH1, MSH2, MSH3, MSH6, MUTYH, NBN, NF1, NHTL1, PALB2, PDGFRA, PMS2, POLD1, POLE, PTEN, RAD50, RAD51C, RAD51D, SDHB, SDHC, SDHD, SMAD4, SMARCA4. STK11, TP53, TSC1, TSC2, and VHL.  The following genes were evaluated for sequence changes only: SDHA and HOXB13 c.251G>A variant only. The report date is February 15, 2018.     Cancer of central portion of left breast ([place])   05/02/2017 Breast MRI    IMPRESSION:  1. 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 2. 4 mm enhancing mass with associated architectural distortion in the central left breast. This is suspicious for additional focus of carcinoma.    05/03/2017 Mammogram    IMPRESSION: 1. Suspicious area of architectural distortion in the left breast which corresponds to a small enhancing mass on  MRI.  RECOMMENDATION: Stereotactic core needle biopsy of the area of architectural distortion in the left breast.     05/03/2017 Breast US    Stereotactic core needle biopsy of the area of architectural distortion in the left breast.    05/03/2017 Initial Biopsy    Breast, left, needle core biopsy, central, slightly toward the lower, outer quadrant - INVASIVE DUCTAL CARCINOMA, MSBR GRADE 1/2. - SEE MICROSCOPIC DESCRIPTION. ER 50% positive PR 90% positive Ki67: 1% HER2 negative     05/07/2017 Initial Diagnosis    Cancer of central portion of left breast ([place])    08/22/2017 Surgery    LEFT BREAST LUMPECTOMY WITH RADIOACTIVE SEED AND SENTINEL LYMPH NODE BIOPSY ERAS PATHWAY by Dr. Georgette Dover 08/22/17     08/22/2017 Pathology Results    Diagnosis 08/22/17 1. Breast, lumpectomy, Left - ATYPICAL DUCTAL HYPERPLASIA - FIBROCYSTIC CHANGES - PREVIOUS BIOPSY SITE CHANGES - SEE COMMENT 2. Lymph node, sentinel, biopsy, a total of 4 nodes, all negative for malignant cells  6. Breast, simple mastectomy, Right - INVASIVE LOBULAR CARCINOMA, NOTTINGHAM GRADE 2/3, 6.0 CM - LOBULAR NEOPLASIA (ATYPICAL LOBULAR HYPERPLASIA) - PREVIOUS BIOPSY SITE CHANGES 7. 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  The Hereditary Gene Panel offered by [facility] includes sequencing and/or deletion duplication testing of the following 47 genes: APC, ATM, AXIN2, BARD1, BMPR1A, BRCA1, BRCA2, BRIP1, CDH1, CDK4, CDKN2A (p14ARF), CDKN2A (p16INK4a), CHEK2, CTNNA1, DICER1, EPCAM (Deletion/duplication testing only), GREM1 (promoter region deletion/duplication testing only), KIT, MEN1, MLH1, MSH2, MSH3, MSH6, MUTYH, NBN, NF1, NHTL1, PALB2, PDGFRA, PMS2, POLD1, POLE, PTEN, RAD50, RAD51C, RAD51D, SDHB, SDHC, SDHD, SMAD4, SMARCA4. STK11, TP53, TSC1, TSC2, and VHL.  The  following genes were evaluated for sequence changes only: SDHA and HOXB13 c.251G>A variant only. The report date is February 15, 2018.    HISTORY OF PRESENTING ILLNESS:  Madison Stokes 72 y.o. female is here because of newly diagnosed bilateral breast cancer. She was referred by Dr. Georgette Dover. She presents to clinic accompanied by her daughter in law and husband. She noticed a right breast mass in early September 2018, she underwent diagnostic right mammogram with ultrasound on 03/08/17; no discrete mass was seen sonographically or mammographically. She underwent right breast biopsy with flip placement on 04/24/17, found to be invasive mammary carcinoma, favoring lobular type. She was referred to Dr. Georgette Dover who then ordered bilateral breast MRI. Imaging identified non mass enhancement spanning 5.9 x 3.2 x 5.6 cm in the right breast as well as a small enhancing 4 mm mass in the central left breast with a small hypoechoic focus. She then underwent diagnostic mammogram with [place] and biopsy in the left breast. Pathology revealed invasive ductal carcinoma. Her last mammogram was 04/2016, no abnormal mammogram or previous breast biopsy. She has not noticed associated breast changes, nipple discharge or inversion, or skin changes. No weight loss, decreased appetite, or fatigue.   She has  no significant past medical history, she is on blood pressure and cholesterol medication. She is retired Network engineer, lives with her husband; has 2 adult sons. No tobacco or alcohol history. She reports having a cold lately she caught from her grandson but otherwise feels well. Occasionally has urgent BM after meals, denies abd fullness, pain, early satiety, nausea or vomiting.   GYN HISTORY  Menarchal: 55 LMP: age 60, menopause  Contraceptive:  HRT: None GP: 2 pregnancies, 2 births    CURRENT THERAPY:   -adjuvant Letrozole once daily started 08/16/17  -Zometa injections every 6 months for 2 years starting 03/21/18   INTERVAL  HISTORY:  Madison Stokes is here for a follow up for her bilateral breast cancer. Since her last visit she had a Bone density scan which she was found to be osteopenic. She also had a PET scan which was completely benign. She also attended Survivorship Clinic with NP Willaim Bane last month.   She presents to the clinic today accompanied by her family member. She notes she feels well overall.  She notes tingling in her right arm which is intermittent. She has no swelling or issues with her hand function. She notes pain in her left chest wall that comes and goes. She denies having acid reflux. She notes her appetite is adequate.   She has questions regarding her Zometa injection and lymphedema. She has no swelling now, but has compression sleeve at home. She notes soft tissue swelling below her neck and the front of her right knee.     SOCIAL HISTORY: Social History   Socioeconomic History  . Marital status: Married    Spouse name: Not on file  . Number of children: Not on file  . Years of education: Not on file  . Highest education level: Not on file  Occupational History  . Not on file  Social Needs  . Financial resource strain: Not on file  . Food insecurity:    Worry: Not on file    Inability: Not on file  . Transportation needs:    Medical: Not on file    Non-medical: Not on file  Tobacco Use  . Smoking status: Never Smoker  . Smokeless tobacco: Never Used  Substance and Sexual Activity  . Alcohol use: Yes    Comment: not recently  . Drug use: No  . Sexual activity: Not on file  Lifestyle  . Physical activity:    Days per week: Not on file    Minutes per session: Not on file  . Stress: Not on file  Relationships  . Social connections:    Talks on phone: Not on file    Gets together: Not on file    Attends religious service: Not on file    Active member of club or organization: Not on file    Attends meetings of clubs or organizations: Not on file     Relationship status: Not on file  . Intimate partner violence:    Fear of current or ex partner: Not on file    Emotionally abused: Not on file    Physically abused: Not on file    Forced sexual activity: Not on file  Other Topics Concern  . Not on file  Social History Narrative  . Not on file    FAMILY HISTORY: Family History  Problem Relation Age of Onset  . Cancer Mother 74       uterine; d. 12  . Prostate cancer Brother 68  .  Heart disease Father   . Heart attack Father   . Diabetes Father   . Hypertension Father   . Bone cancer Maternal Uncle   . Heart attack Paternal Uncle   . Prostate cancer Maternal Grandfather   . Bone cancer Maternal Grandfather   . Bone cancer Maternal Uncle   . Bone cancer Cousin     ALLERGIES:  has No Known Allergies.  MEDICATIONS:  Current Outpatient Medications  Medication Sig Dispense Refill  . aspirin EC 81 MG tablet Take 81 mg by mouth daily.    . Biotin 10 MG CAPS Take by mouth.    . Calcium Citrate-Vitamin D 500-500 MG-UNIT PACK Take 2 tablets by mouth daily.    Marland Kitchen HYDROcodone-acetaminophen (NORCO/VICODIN) 5-325 MG tablet Take 1 tablet by mouth every 6 (six) hours as needed for moderate pain. 12 tablet 0  . irbesartan (AVAPRO) 150 MG tablet Take 150 mg by mouth daily.     Marland Kitchen letrozole (FEMARA) 2.5 MG tablet Take 1 tablet (2.5 mg total) by mouth daily. 90 tablet 3  . loperamide (IMODIUM) 2 MG capsule Take 2-4 mg by mouth 4 (four) times daily as needed for diarrhea or loose stools.     Marland Kitchen loratadine (CLARITIN) 10 MG tablet Take 10 mg by mouth daily as needed for allergies.    . Multiple Vitamin (MULTIVITAMIN) tablet Take 1 tablet daily by mouth.    . simvastatin (ZOCOR) 40 MG tablet Take 40 mg at bedtime by mouth.    Marland Kitchen VITAMIN B COMPLEX-C PO Take by mouth.     No current facility-administered medications for this visit.    Facility-Administered Medications Ordered in Other Visits  Medication Dose Route Frequency Provider Last Rate Last  Dose  . zolendronic acid (ZOMETA) 4 mg in sodium chloride 0.9 % 100 mL IVPB  4 mg Intravenous Once Causey, Charlestine Massed, NP        REVIEW OF SYSTEMS:  Constitutional: Denies fatigue, fevers, chills or abnormal night sweats  Eyes: Denies blurriness of vision, double vision or watery eyes Ears, nose, mouth, throat, and face: Denies mucositis or sore throat  Respiratory: Denies dyspnea or wheezes  Cardiovascular: Denies palpitation, chest discomfort or lower extremity swelling Gastrointestinal:  Denies nausea, vomiting   Skin: negative (+) Soft tissue swelling below neck and on right anterior knee Lymphatics: Denies new lymphadenopathy or easy bruising Neurological:Denies new weaknesses  Behavioral/Psych: Mood is stable, no new changes  All other systems were reviewed with the patient and are negative.  PHYSICAL EXAMINATION:  ECOG PERFORMANCE STATUS: 1  Vitals:   03/21/18 0944  BP: (!) 149/82  Pulse: 67  Resp: 17  Temp: 98.6 F (37 C)  SpO2: 97%   Filed Weights   03/21/18 0944  Weight: 124 lb 1.6 oz (56.3 kg)     GENERAL:alert, no distress and comfortable SKIN: skin color, texture, turgor are normal, no rashes or significant lesions (+) Mild soft tissue swelling below neck and anterior right knee EYES: normal, conjunctiva are pink and non-injected, sclera clear OROPHARYNX:no exudate, no erythema and lips, buccal mucosa, and tongue normal  NECK: supple, thyroid normal size, non-tender, without nodularity LYMPH:  no palpable cervical, supraclavicular, axillary, or inguinal lymphadenopathy  LUNGS: clear to auscultation bilaterally with normal breathing effort HEART: regular rate & rhythm and no murmurs and no lower extremity edema ABDOMEN:abdomen soft, non-tender and normal bowel sounds. No palpable hepatomegaly  Musculoskeletal:no cyanosis of digits and no clubbing  PSYCH: alert & oriented x 3 with fluent speech  NEURO: no focal motor/sensory deficits BREASTS: S/p right  mastectomy and left lumpectomy: (+) right breast surgically absent, surgical scars has healed well with some soft tissue fullness of right chest wall. Left breast incision healed well, tender and mild swelling. No palpable mass or adenopathy.   CBC Latest Ref Rng & Units 03/21/2018 02/05/2018 01/13/2018  WBC 3.9 - 10.3 K/uL 3.0(L) 3.2(L) 3.9  Hemoglobin 11.6 - 15.9 g/dL 13.9 13.4 13.8  Hematocrit 34.8 - 46.6 % 41.9 40.4 42.2  Platelets 145 - 400 K/uL 190 195 215   CMP Latest Ref Rng & Units 03/21/2018 02/05/2018 01/13/2018  Glucose 70 - 99 mg/dL 99 105(H) 103(H)  BUN 8 - 23 mg/dL _0 Creatinine 0.44 - 1.00 mg/dL 0.86 0.83 0.95  Sodium 135 - 145 mmol/L 145 144 143  Potassium 3.5 - 5.1 mmol/L 4.4 4.3 4.6  Chloride 98 - 111 mmol/L 109 108 106  CO2 22 - 32 mmol/L _1 Calcium 8.9 - 10.3 mg/dL 9.9 9.6 10.1  Total Protein 6.5 - 8.1 g/dL 7.1 6.7 7.1  Total Bilirubin 0.3 - 1.2 mg/dL 0.3 0.6 0.4  Alkaline Phos 38 - 126 U/L 95 89 99  AST 15 - 41 U/L 30 29 35  ALT 0 - 44 U/L 27 30 36     CA 27.29  05/13/17: 59.5 05/31/17: 50.0 07/04/17: 44.2 08/02/17: 49.6 09/05/17: 31.7 09/26/2017: 30.7 11/05/17: 36.6 12/17/17: 103.3 01/13/18: 91.1 02/05/18: 84.7 03/21/18: PENDING    Diagnosis 08/22/17 1. Breast, lumpectomy, Left - ATYPICAL DUCTAL HYPERPLASIA - FIBROCYSTIC CHANGES - PREVIOUS BIOPSY SITE CHANGES - SEE COMMENT 2. Lymph node, sentinel, biopsy, Left Axillary #1 - NO CARCINOMA IDENTIFIED IN ONE LYMPH NODE (0/1) - SEE COMMENT 3. Lymph node, sentinel, biopsy, Left Axillary #2 - NO CARCINOMA IDENTIFIED IN ONE LYMPH NODE (0/1) - SEE COMMENT 4. Lymph node, sentinel, biopsy, Left Axillary #3 - NO CARCINOMA IDENTIFIED IN ONE LYMPH NODE (0/1) - SEE COMMENT 5. Lymph node, sentinel, biopsy, Left - NO CARCINOMA IDENTIFIED IN ONE LYMPH NODE (0/1) - SEE COMMENT 6. Breast, simple mastectomy, Right - INVASIVE LOBULAR CARCINOMA, NOTTINGHAM GRADE 2/3, 6.0 CM - LOBULAR NEOPLASIA (ATYPICAL LOBULAR  HYPERPLASIA) - PREVIOUS BIOPSY SITE CHANGES - SEE ONCOLOGY TABLE AND COMMENT BELOW 7. Lymph node, sentinel, biopsy, Right Axillary #1 - NO CARCINOMA IDENTIFIED IN ONE LYMPH NODE (0/1) - SEE COMMENT 8. Lymph node, sentinel, biopsy, Right - NO CARCINOMA IDENTIFIED IN ONE LYMPH NODE (0/1) - SEE COMMENT 9. Lymph node, sentinel, biopsy, Right - NO CARCINOMA IDENTIFIED IN ONE LYMPH NODE (0/1) 1 of 5 FINAL for Madison Stokes, Madison Stokes ([id number]) Diagnosis(continued) - SEE COMMENT 10. Lymph node, sentinel, biopsy, Right Axillary #2 - METASTATIC CARCINOMA PRESENT IN ONE LYMPH NODE (1/1) - SEE COMMENT 11. Lymph node, sentinel, biopsy, Right Axillary #3 - NO CARCINOMA IDENTIFIED IN ONE LYMPH NODE (0/1) - SEE COMMENT 12. Lymph node, sentinel, biopsy, Right - NO CARCINOMA IDENTIFIED IN ONE LYMPH NODE (0/1) - SEE COMMENT Microscopic Comment 1. An oncology table was not completed on this specimen because here was no residual carcinoma identified. 2. -5 and 7-12. Cytokeratin AE1/3 was utilized to exclude micrometastasis. Only one lymph node has isolated tumor cells (see part 10); all other lymph nodes were negative. 6. BREAST, STATUS POST NEOADJUVANT TREATMENT Procedure: Mastectomy Laterality: Right Tumor Size: 6 x 3.5x 2.5 cm (see comment) Histologic Type: Invasive lobular carcinoma Grade: Nottingham Grade 2 Tubular Differentiation: 3 Nuclear Pleomorphism: 3 Mitotic Count: 1 Ductal Carcinoma in Situ (DCIS):  Not identified Regional Lymph Nodes: Number of Lymph Nodes Examined:10 Number of Sentinel Lymph Nodes Examined: 10 Lymph Nodes with Macrometastases: 0 Lymph Nodes with Micrometastases: 0 Lymph Nodes with Isolated Tumor Cells: 1 Margins: Uninvolved by carcinoma Invasive carcinoma, distance from closest margin: 0.9 (anterior soft tissue margin) DCIS, distance from closest margin: N/A Breast Prognostic Profile (pre-neoadjuvant case #: [id number]) Estrogen Receptor: Positive (100%,  strong) Progesterone Receptor: Positive (100%, strong) Her2: Positive (Ratio: 2.10) Ki-67: 10% Residual Cancer Burden (RCB): Primary Tumor Bed: 60 mm x 26m Overall Cancer Cellularity: 30% Percentage of Cancer that is in Situ: N/A Number of Positive Lymph Nodes: 1 Diameter of Largest Lymph Node metastasis: 1 mm Residual Cancer Burden : 3.186 Residual Cancer Burden Class: RCB-II Pathologic Stage Classification (p TNM, AJCC 8th Edition): Primary Tumor: ypT3 Regional Lymph Nodes: ypN0(i+) COMMENT: There is residual tumor present in all of the submitted blocks from the upper and lower outer quadrant of the breast and therefore the measurement of the tumor is based on the gross measurement.  6. By immunohistochemistry, the tumor cells are negative for Her2 (0).    Diagnosis 05/03/17  Breast, left, needle core biopsy, central, slightly toward the lower, outer quadrant - INVASIVE DUCTAL CARCINOMA, MSBR GRADE 1/2. - SEE MICROSCOPIC DESCRIPTION. ER 50% positive PR 90% positive Ki67 1% Microscopic Comment Breast prognostic profile will be performed. [facility]2017.   Diagnosis 04/24/17  Breast, right, needle core biopsy, UOQ, centered at 9:30 o'clock - INVASIVE MAMMARY CARCINOMA. - SEE COMMENT. Microscopic Comment The carcinoma appears grade II. An E-cadherin and a breast prognostic profile will be performed and the results reported separately. The results were called to [facility] 04/25/2017. (JBK:ecj 04/25/2017) Results: IMMUNOHISTOCHEMICAL AND MORPHOMETRIC ANALYSIS PERFORMED MANUALLY Estrogen Receptor: 100%, POSITIVE, STRONG STAINING INTENSITY Progesterone Receptor: 100%, POSITIVE, STRONG STAINING INTENSITY Proliferation Marker Ki67: 10% HER2: **POSITIVE** RATIO OF HER2/CEP17 SIGNALS 2.10 AVERAGE HER2 COPY NUMBER PER CELL 3.30  PROCEDURES  ECHO 09/20/17 Impressions: - Compared to prior study, LV strain has  increased from -20% to   -18%  Colonoscopy by Dr. KTherisa Doyne1/20/19 IMPRESSION:  - The examined portion of the ileum was normal. - The entire examined colon is normal. - The distal rectum and anal verge are normal on retroflexion view. - No specimens collected    EGD by Dr. KTherisa Doyne1/19/19 IMPRESSION: - Normal esophagus. - Z-line regular, 36 cm from the incisors. - 2 cm hiatal hernia. - Non-bleeding erosive gastropathy. - Erythematous mucosa in the prepyloric region of the stomach. - Duodenal erosions without bleeding. - No specimens collected.   ECHO 05/15/17  Impressions: - LVEF 60-65%, normal wall thickness, normal wall motion and GLS   strain, grade 1 DD, normal LV filling pressure, mild MR, normal   LA size, normal IVC.   RADIOGRAPHIC STUDIES: I have personally reviewed the radiological images as listed and agreed with the findings in the report.  Bone Density 01/29/18 ASSESSMENT: The BMD measured at AP Spine L1-L2 is 0.929 g/cm2 with a T-score of -2.0. This patient is considered osteopenic according to [place] criteria. The scan quality is good. L-3, L-4 were excluded due to degenerative changes. Site Region Measured Date Measured Age YA BMD Significant CHANGE T-score AP Spine  L1-L2      01/29/2016    [age]         -2.0    0.929 g/cm2 DualFemur Neck Left  01/29/2018    [age]         -  1.5    0.830 g/cm2 DualFemur Total Mean 01/29/2018    69.8         -0.8    0.912 g/cm2   PET 01/22/18 IMPRESSION: 1. Status post right mastectomy and left breast lumpectomy. 2. Minimal left axillary scarring changes near a surgical clip. No findings suspicious for chest wall tumor or axillary or supraclavicular adenopathy. 3. No findings for metastatic disease involving the lungs, abdomen/pelvis or osseous structures   CT AP W Contrast 12/17/17 IMPRESSION: 1. Stable bilateral renal cysts. No change since prior examination and no worrisome CT imaging features.  Stable lower pole right renal cyst. 2. No acute abdominal/pelvic findings or lymphadenopathy. 3. Stable small hepatic cysts. 4. Stable uterine fibroids.   Whole Body Bone Scan 05/22/17 IMPRESSION: 1. No findings of osseous metastatic disease. 2. Lower lumbar activity corresponds to areas of significant benign spondylosis on the CT scan  CT CAP with Contrast IMPRESSION: 1. No specific CT findings of nodal or other metastatic disease in the chest, abdomen, and pelvis. 2. Bosniak category 41F cyst in the right mid kidney, with mild enhancement along a septation but without nodularity. Unless follow up abdominal scans related to the patient's breast cancer adequately characterize this lesion, renal protocol MRI or CT scan would be recommended in 6 months time. 3. Other imaging findings of potential clinical significance: The Aortic Atherosclerosis (ICD10-I70.0). Ectatic ascending thoracic aorta without aneurysm. Small type 1 hiatal hernia. Several small hypodense liver lesions are likely benign although technically too small to characterize. Nonobstructive 1.2 cm right renal calculus. Anterior uterine fundal myometrial mass favoring fibroid. Pelvic floor laxity with small cystocele. Notable spondylosis at L3-4 and L5-S1.  Diagnostic Mammogram Bilateral 05/03/17  IMPRESSION: 1. 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 2. 4 mm enhancing mass with associated architectural distortion in the central left breast. This is suspicious for additional focus of Carcinoma.  Diagnostic Mammogram, right 03/08/17 IMPRESSION: 1. Patient has a is palpable mass in the 9:30 position in the lateral right breast 2 cm from the nipple, in the location dense fibroglandular breast tissue. Although no discrete mass is seen sonographically or mammographically, the Clinical change remains concerning. Biopsy is recommended.  No  results found.  ASSESSMENT & PLAN:  72 y.o. caucasian postmenopausal woman   1. 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 In her right breast, she has large area of disease that is HER2 initially diagnosed as positive by FISH. -Due to the high risk of recurrence from right breast cancer, she underwent neoadjuvnat chemo with TCHP -Due to her lack of response to chemotherapy, chemo was held after 4 cycles.  -She underwent right mastectomy and left breast lumpectomy with b/l sentinel lymph node biopsy on 08/22/17.  -Pathology results were reviewed with pt which showed a 6cm invasive ductal carcinoma in right breast, with 1 out of 6 positive lymph nodes. Margins were negative. Left breast invasive ductal carcinoma had complete response to chemotherapy tumor.  -We previously discussed right side lymph node dissection, giving her 1 out of 6 positive lymph nodes, I do not feel strongly she needs lymph node  dissection.  -I previously requested HER2 to be retested on her surgical right sided tumor, the IHC test was negative (0). I have requested to do HER2 IHC on her initial biopsy of right side tumor, and it was also negative (1+) -Given the final negative HER-2, I have stopped her adjuvant Herceptin and perjeta maintenance therapy. She only had 2 doses on 08/16/17 and 09/05/17.  -I do not recommend adjuvant chemotherapy as her tumor was not sensitive to chemo. -She has completed bilateral adjuvant radiation 09/25/17-11/05/17. She tolerated well. -She was started on adjuvant letrozole on 08/16/17. She is tolerating letrozole well overall with manageable hot  flashes. Continue letrozole daily for 10 years.  -She had PAC removal on 11/06/17 -We discussed her CT AP from 12/17/17 which showed stable renal and hepatic cysts and uterine fibroids with no acute AP or lymphadenopathy findings. No evidence of recurrence in the abdomen and the pelvis -Her CA 27.29 significantly increased from 36.6 in 10/2017 to 103.3 in 11/2017 and remained elevated. For further evaluation she underwent a PET scan on 01/22/18 which showed no evidence of recurrence or metastasis. I discussed with her today  -She attended Survivorship Clinic with NP Jamelle Haring on 02/05/18. -she was screened for the NATALEE clinical trial but did not qualify  -I discussed she can use her compression sleeve to prevent any lymphedema especially while traveling.  -Labs reviewed, WBC at 3, lymphocytes at 0.7, CMP WNL. Her Ca 27.29 is still pending.  She is clinically doing very well.  Her tumor marker CA 27.29 has been trending down. -She will continue Letrozole daily  -Next Mammogram in 02/2018   -She has already had her Flu shot for this year.  -F/u in 3 months    2. Genetics  -Given her patient's synchronous bilateral ductal and lobular carcinoma, her brother's prostate cancer and her mother's uterine cancer in her 4's, I previously referred her to genetics.  -She was seen by them on 02/05/18. 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 Otherwise her results were negative.    3. Bosniak category 65F cyst in the right mid kidney, 1.2cm Non-obstructive kidney stone -found on 05/22/17 CT Scan  -Her 12/17/17 CT AP showed stable renal and hepatic cysts. Will continue to monitor.    4. Anemia, secondary to chemotherapy, and history of GI bleeding in 06/2017 -She experienced significant GI bleeding with HG of 5.7 and HCT of 17.4 and was hospitalized on 07/12/17. GI workup showed no site of bleeding. She received blood transfusion on  07/15/17. Hg recovered and showed good response to blood transfusion.  -anemia resolved currently.   6. Osteopenia  -With start of Letrozole I previously discussed the side effect of weak bone from letrozole   -Her 01/2018 DEXA showed osteopenia with the lowest T-Score at AP Spine at -2.  -As she is on Letrozole, I recommend Zometa injections every 6 months for 2 years total to improve her bone health and reduce risk of fracture. I discussed side effects in great detail, especially the possibility of jaw necrosins. I advised her to have routine dental checkups and cleanings and to hold injections if she needs invasive dental procedures. She is interested in proceeding. She will start Zometa injections today (03/21/18) and continue every 6 months for 2 years  -Continue Calcium and VitD supplement daily.   PLAN:  -PET scan and Labs reviewed with pt today  -Proceed with Zometa injection today and every 6 months -  Continue Letrozole daily  -Mammogram in 02/2018  -Lab and f/u in 3 months    No orders of the defined types were placed in this encounter.  All questions were answered. The patient knows to call the clinic with any problems, questions or concerns.   [name], MD 03/21/2018 10:56 AM   I, Joslyn Devon, am acting as scribe for [name], MD.   I have reviewed the above documentation for accuracy and completeness, and I agree with the above.

## 2018-03-21 ENCOUNTER — Encounter: Payer: Self-pay | Admitting: Hematology

## 2018-03-21 ENCOUNTER — Telehealth: Payer: Self-pay | Admitting: Hematology

## 2018-03-21 ENCOUNTER — Inpatient Hospital Stay: Payer: Medicare Other

## 2018-03-21 ENCOUNTER — Inpatient Hospital Stay (HOSPITAL_BASED_OUTPATIENT_CLINIC_OR_DEPARTMENT_OTHER): Payer: Medicare Other | Admitting: Hematology

## 2018-03-21 ENCOUNTER — Inpatient Hospital Stay: Payer: Medicare Other | Attending: Hematology

## 2018-03-21 VITALS — BP 149/82 | HR 67 | Temp 98.6°F | Resp 17 | Ht 62.0 in | Wt 124.1 lb

## 2018-03-21 DIAGNOSIS — E119 Type 2 diabetes mellitus without complications: Secondary | ICD-10-CM

## 2018-03-21 DIAGNOSIS — C50811 Malignant neoplasm of overlapping sites of right female breast: Secondary | ICD-10-CM | POA: Diagnosis not present

## 2018-03-21 DIAGNOSIS — C50112 Malignant neoplasm of central portion of left female breast: Secondary | ICD-10-CM

## 2018-03-21 DIAGNOSIS — Z170 Estrogen receptor positive status [ER+]: Secondary | ICD-10-CM

## 2018-03-21 DIAGNOSIS — M8580 Other specified disorders of bone density and structure, unspecified site: Secondary | ICD-10-CM | POA: Diagnosis not present

## 2018-03-21 DIAGNOSIS — I10 Essential (primary) hypertension: Secondary | ICD-10-CM

## 2018-03-21 LAB — COMPREHENSIVE METABOLIC PANEL
ALT: 27 U/L (ref 0–44)
AST: 30 U/L (ref 15–41)
Albumin: 4.1 g/dL (ref 3.5–5.0)
Alkaline Phosphatase: 95 U/L (ref 38–126)
Anion gap: 9 (ref 5–15)
BUN: 22 mg/dL (ref 8–23)
CO2: 27 mmol/L (ref 22–32)
Calcium: 9.9 mg/dL (ref 8.9–10.3)
Chloride: 109 mmol/L (ref 98–111)
Creatinine, Ser: 0.86 mg/dL (ref 0.44–1.00)
GFR calc Af Amer: >60 mL/min (ref >60)
GFR calc non Af Amer: >60 mL/min (ref >60)
Glucose, Bld: 99 mg/dL (ref 70–99)
Potassium: 4.4 mmol/L (ref 3.5–5.1)
Sodium: 145 mmol/L (ref 135–145)
Total Bilirubin: 0.3 mg/dL (ref 0.3–1.2)
Total Protein: 7.1 g/dL (ref 6.5–8.1)

## 2018-03-21 LAB — CBC WITH DIFFERENTIAL/PLATELET
Basophils Absolute: 0 10*3/uL (ref 0.0–0.1)
Basophils Relative: 2 %
Eosinophils Absolute: 0.2 10*3/uL (ref 0.0–0.5)
Eosinophils Relative: 6 %
HCT: 41.9 % (ref 34.8–46.6)
Hemoglobin: 13.9 g/dL (ref 11.6–15.9)
Lymphocytes Relative: 24 %
Lymphs Abs: 0.7 10*3/uL — ABNORMAL LOW (ref 0.9–3.3)
MCH: 31 pg (ref 25.1–34.0)
MCHC: 33.3 g/dL (ref 31.5–36.0)
MCV: 93.1 fL (ref 79.5–101.0)
Monocytes Absolute: 0.5 10*3/uL (ref 0.1–0.9)
Monocytes Relative: 17 %
Neutro Abs: 1.6 10*3/uL (ref 1.5–6.5)
Neutrophils Relative %: 51 %
Platelets: 190 10*3/uL (ref 145–400)
RBC: 4.5 MIL/uL (ref 3.70–5.45)
RDW: 16 % — ABNORMAL HIGH (ref 11.2–14.5)
WBC: 3 10*3/uL — ABNORMAL LOW (ref 3.9–10.3)

## 2018-03-21 MED ORDER — SODIUM CHLORIDE 0.9 % IV SOLN
INTRAVENOUS | Status: DC
  Administered 2018-03-21: 11:00:00 via INTRAVENOUS
  Filled 2018-03-21: qty 250

## 2018-03-21 MED ORDER — ZOLEDRONIC ACID 4 MG/100ML IV SOLN
4.0000 mg | Freq: Once | INTRAVENOUS | Status: AC
  Administered 2018-03-21: 4 mg via INTRAVENOUS
  Filled 2018-03-21: qty 100

## 2018-03-21 NOTE — Telephone Encounter (Signed)
Appts scheduled avs declined/calendar printed per 9/27 los

## 2018-03-21 NOTE — Patient Instructions (Signed)

## 2018-03-22 LAB — CANCER ANTIGEN 27.29: CA 27.29: 82.5 U/mL — ABNORMAL HIGH (ref 0.0–38.6)

## 2018-03-24 ENCOUNTER — Telehealth: Payer: Self-pay

## 2018-03-24 NOTE — Telephone Encounter (Signed)
-----   Message from [name], MD sent at 03/22/2018  6:15 PM EDT ----- Please let her know the tumor marker result, stable, no concerns, thanks   [name]  03/22/2018

## 2018-03-24 NOTE — Telephone Encounter (Signed)
Spoke with patient per Dr. Burr Medico notified her tumor marker result, stable, no concerns, patient verbalized an understanding.

## 2018-04-09 ENCOUNTER — Telehealth: Payer: Self-pay

## 2018-04-09 NOTE — Telephone Encounter (Signed)
Faxed signed order for silicone breast prosthesis to [facility].

## 2018-05-06 ENCOUNTER — Ambulatory Visit
Admission: RE | Admit: 2018-05-06 | Discharge: 2018-05-06 | Disposition: A | Discharge status: Home / Self Care | Payer: Medicare Other | Source: Ambulatory Visit | Attending: Hematology | Admitting: Hematology

## 2018-05-06 DIAGNOSIS — R922 Inconclusive mammogram: Secondary | ICD-10-CM | POA: Diagnosis not present

## 2018-05-06 DIAGNOSIS — Z170 Estrogen receptor positive status [ER+]: Secondary | ICD-10-CM

## 2018-05-06 DIAGNOSIS — Z853 Personal history of malignant neoplasm of breast: Secondary | ICD-10-CM | POA: Diagnosis not present

## 2018-05-06 DIAGNOSIS — C50811 Malignant neoplasm of overlapping sites of right female breast: Secondary | ICD-10-CM

## 2018-05-06 HISTORY — DX: Personal history of antineoplastic chemotherapy: Z92.21

## 2018-05-06 HISTORY — DX: Personal history of irradiation: Z92.3

## 2018-05-07 DIAGNOSIS — H1131 Conjunctival hemorrhage, right eye: Secondary | ICD-10-CM | POA: Diagnosis not present

## 2018-05-07 DIAGNOSIS — H43811 Vitreous degeneration, right eye: Secondary | ICD-10-CM | POA: Diagnosis not present

## 2018-05-07 DIAGNOSIS — Z961 Presence of intraocular lens: Secondary | ICD-10-CM | POA: Diagnosis not present

## 2018-05-07 DIAGNOSIS — H35372 Puckering of macula, left eye: Secondary | ICD-10-CM | POA: Diagnosis not present

## 2018-05-08 ENCOUNTER — Ambulatory Visit: Payer: Medicare Other | Admitting: Hematology

## 2018-05-08 ENCOUNTER — Other Ambulatory Visit: Payer: Medicare Other

## 2018-06-20 NOTE — Progress Notes (Signed)
Madison Stokes   [phone] [phone]   Clinic Follow up Note   Patient Care Team: [name], MD as PCP - General (Family Medicine) [name], MD as Consulting Physician (Hematology) [name], NP as Nurse Practitioner (Nurse Practitioner) [name], MD as Consulting Physician (General Surgery) [name], NP as Nurse Practitioner (Hematology and Oncology)  Date of Service:  06/23/2018  CHIEF COMPLAINT: F/u of bilateral breast cancer  SUMMARY OF ONCOLOGIC HISTORY: Oncology History   Cancer Staging Cancer of central portion of left breast [facility]) Staging form: Breast, AJCC 8th Edition - Clinical stage from 05/03/2017: Stage IA (cT1a, cN0, cM0, G1, ER: Positive, PR: Positive, HER2: Negative) - Signed by [name], MD on 05/07/2017 - Pathologic stage from 08/22/2017: No Stage Recommended (ypT0, pN0, cM0, GX, ER: Unknown, PR: Unknown, HER2: Unknown) - Signed by [name], MD on 11/06/2017  Cancer of overlapping sites of right female breast [facility]) Staging form: Breast, AJCC 8th Edition - Clinical stage from 04/24/2017: Stage IIA (cT3, cN0, cM0, G2, ER+, PR+, HER2-) - Signed by [name], MD on 11/06/2017 - Pathologic stage from 08/22/2017: No Stage Recommended (ypT3, pN0(i+), cM0, G2, ER+, PR+, HER2-) - Signed by [name], MD on 10/29/2017       Cancer of overlapping sites of right female breast ([place])   03/08/2017 Mammogram    IMPRESSION: 1. Patient has a is palpable mass in the lateral right breast, in the location dense fibroglandular breast tissue. Although no discrete mass is seen sonographically or mammographically, the Clinical change remains concerning. Biopsy is recommended.  RECOMMENDATION: 1. Ultrasound-guided core needle biopsy of palpable abnormality in the lateral right breast.    04/24/2017 Breast US    IMPRESSION: Ultrasound guided biopsy of the right breast. No apparent complications.    04/24/2017  Initial Biopsy    Breast, right, needle core biopsy, UOQ, centered at 9:30 o'clock - INVASIVE MAMMARY CARCINOMA. - SEE COMMENT. ER 100% positive PR 100% positive Ki67 10% HER2 FISH positive (ratio 2.10, copy number 3.30)  HER2 IHC was done on the biopsy sample after surgery, and was negative (1+). So the final HER2 is NEGATIVE (determined after surgery)    05/02/2017 Breast MRI    IMPRESSION: 1. 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 2. 4 mm enhancing mass with associated architectural distortion in the central left breast. This is suspicious for additional focus of carcinoma.    05/07/2017 Initial Diagnosis    Cancer of overlapping sites of right female breast ([place])    05/15/2017 Echocardiogram    ECHO 05/15/17  Impressions: - LVEF 60-65%, normal wall thickness, normal wall motion and GLS   strain, grade 1 DD, normal LV filling pressure, mild MR, normal   LA size, normal IVC.       05/22/2017 Imaging    CT CAP W Contrast 05/22/17 IMPRESSION: 1. No specific CT findings of nodal or other metastatic disease in the chest, abdomen, and pelvis. 2. Bosniak category 76F cyst in the right mid kidney, with mild enhancement along a septation but without nodularity. Unless follow up abdominal scans related to the patient's breast cancer adequately characterize this lesion, renal protocol MRI or CT scan would be recommended in 6 months time. 3. Other imaging findings of potential clinical significance: The Aortic Atherosclerosis (ICD10-I70.0). Ectatic ascending thoracic aorta without aneurysm. Small type 1 hiatal hernia. Several small hypodense liver lesions are likely  benign although technically too small to characterize. Nonobstructive 1.2 cm right renal calculus. Anterior uterine fundal myometrial mass favoring fibroid. Pelvic floor laxity with small cystocele. Notable spondylosis at L3-4  and L5-S1.     05/22/2017 Imaging    Bone Scan Whole Body 05/22/17 IMPRESSION: 1. No findings of osseous metastatic disease. 2. Lower lumbar activity corresponds to areas of significant benign spondylosis on the CT scan.    05/24/2017 - 09/05/2017 Chemotherapy    TCHP every 3 weeks for 6 cycles starting 05/24/17 followed by a year of maintenance Herceptin and Perjeta. Given lack of response she stopped TCHP after 4 cycles on 07/26/17.  She will proceeded with Maintenance Herceptin and Perjeta on 08/16/17 and 09/05/17 only due to hesurgical smaple being HER2 negative      07/12/2017 - 07/14/2017 Hospital Admission    Admit date: 07/12/17 Admission diagnosis: GI Hemorrhaging  Additional comments: Colonoscopy and EGD done with no evidence of bleeding site. Given blood transfusion on 07/15/17    08/02/2017 Imaging    MRI Breast Bilateral 2/16//18 IMPRESSION: 1. The known lobular carcinoma of the right breast has not significantly changed since the prior MRI from 05/02/2017. The disease spans approximately 5.6 cm, previously 5.9 cm in the anterior to posterior dimension. 2. 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 3.  No new suspicious findings in either breast. 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    08/16/2017 -  Anti-estrogen oral therapy    Letrozole once daily starting 08/16/17     08/22/2017 Surgery     RIGHT MASTECTOMY WITH SENTINEL LYMPH NODE BIOPSY ERAS PATHWAY by Dr. Georgette Dover    08/22/2017 Pathology Results    See the oncology history under left breast cancer    09/25/2017 - 11/05/2017 Radiation Therapy    She has completed bilateral adjuvant radiation 09/25/17-11/05/17 managed by Dr. Isidore Moos. She tolerated well.    12/17/2017 Imaging    CT AP W Contrast  12/17/17 IMPRESSION: 1. Stable bilateral renal cysts. No change since prior examination and no worrisome CT imaging features. Stable lower pole right renal cyst. 2. No acute abdominal/pelvic findings or lymphadenopathy. 3. Stable small hepatic cysts. 4. Stable uterine fibroids.    01/22/2018 PET scan    PET 01/22/18 IMPRESSION: 1. Status post right mastectomy and left breast lumpectomy. 2. Minimal left axillary scarring changes near a surgical clip. No findings suspicious for chest wall tumor or axillary or supraclavicular adenopathy. 3. 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  The Hereditary Gene Panel offered by [facility] includes sequencing and/or deletion duplication testing of the following 47 genes: APC, ATM, AXIN2, BARD1, BMPR1A, BRCA1, BRCA2, BRIP1, CDH1, CDK4, CDKN2A (p14ARF), CDKN2A (p16INK4a), CHEK2, CTNNA1, DICER1, EPCAM (Deletion/duplication testing only), GREM1 (promoter region deletion/duplication testing only), KIT, MEN1, MLH1, MSH2, MSH3, MSH6, MUTYH, NBN, NF1, NHTL1, PALB2, PDGFRA, PMS2, POLD1, POLE, PTEN, RAD50, RAD51C, RAD51D, SDHB, SDHC, SDHD, SMAD4, SMARCA4. STK11, TP53, TSC1, TSC2, and VHL.  The following genes were evaluated for sequence changes only: SDHA and HOXB13 c.251G>A variant only. The report date is February 15, 2018.     Cancer of central portion of left breast ([place])   05/02/2017 Breast MRI  IMPRESSION: 1. 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 2. 4 mm enhancing mass with associated architectural distortion in the central left breast. This is suspicious for additional focus of carcinoma.    05/03/2017 Mammogram    IMPRESSION: 1. Suspicious area of architectural distortion in the left breast which corresponds  to a small enhancing mass on MRI.  RECOMMENDATION: Stereotactic core needle biopsy of the area of architectural distortion in the left breast.     05/03/2017 Breast US    Stereotactic core needle biopsy of the area of architectural distortion in the left breast.    05/03/2017 Initial Biopsy    Breast, left, needle core biopsy, central, slightly toward the lower, outer quadrant - INVASIVE DUCTAL CARCINOMA, MSBR GRADE 1/2. - SEE MICROSCOPIC DESCRIPTION. ER 50% positive PR 90% positive Ki67: 1% HER2 negative     05/07/2017 Initial Diagnosis    Cancer of central portion of left breast ([place])    08/22/2017 Surgery    LEFT BREAST LUMPECTOMY WITH RADIOACTIVE SEED AND SENTINEL LYMPH NODE BIOPSY ERAS PATHWAY by Dr. Georgette Dover 08/22/17     08/22/2017 Pathology Results    Diagnosis 08/22/17 1. Breast, lumpectomy, Left - ATYPICAL DUCTAL HYPERPLASIA - FIBROCYSTIC CHANGES - PREVIOUS BIOPSY SITE CHANGES - SEE COMMENT 2. Lymph node, sentinel, biopsy, a total of 4 nodes, all negative for malignant cells  6. Breast, simple mastectomy, Right - INVASIVE LOBULAR CARCINOMA, [place] GRADE 2/3, 6.0 CM - LOBULAR NEOPLASIA (ATYPICAL LOBULAR HYPERPLASIA) - PREVIOUS BIOPSY SITE CHANGES 7. 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  The Hereditary Gene Panel offered by [facility] includes sequencing and/or deletion duplication testing of the following 47 genes: APC, ATM, AXIN2, BARD1, BMPR1A, BRCA1, BRCA2, BRIP1, CDH1, CDK4, CDKN2A (p14ARF), CDKN2A (p16INK4a), CHEK2, CTNNA1, DICER1, EPCAM (Deletion/duplication testing only), GREM1 (promoter region deletion/duplication testing only), KIT, MEN1, MLH1, MSH2, MSH3, MSH6, MUTYH, NBN, NF1, NHTL1, PALB2, PDGFRA, PMS2, POLD1, POLE, PTEN, RAD50, RAD51C, RAD51D, SDHB, SDHC, SDHD, SMAD4, SMARCA4. STK11,  TP53, TSC1, TSC2, and VHL.  The following genes were evaluated for sequence changes only: SDHA and HOXB13 c.251G>A variant only. The report date is February 15, 2018.      CURRENT THERAPY:  -adjuvant Letrozole once daily started 08/16/17  -Zometa injections every 6 months for 2 years starting 03/21/18  INTERVAL HISTORY:  Madison Stokes is here for a follow up of her b/l breast cancer. She presents to the clinic today by herself. She notes she is doing well overall. She notes intermittent tingling in her hands. She is on Multivitamin, Biotic, B complex and Calcium and Vit D. She notes she is tolerating letrozole well with hot flashes with are manageable. She denies joint pain.  She plans to go to [place] in 3/15-3/26 in 2020.     REVIEW OF SYSTEMS:   Constitutional: Denies fevers, chills or abnormal weight loss (+) hot flashes, manageable  Eyes: Denies blurriness of vision Ears, nose, mouth, throat, and face: Denies mucositis or sore throat Respiratory: Denies cough, dyspnea or wheezes Cardiovascular: Denies palpitation, chest discomfort or lower extremity swelling Gastrointestinal:  Denies nausea, heartburn or change in bowel habits Skin: Denies abnormal skin rashes (+) hair growth  Lymphatics: Denies new lymphadenopathy or easy bruising Neurological:Denies numbness, tingling or  new weaknesses Behavioral/Psych: Mood is stable, no new changes  All other systems were reviewed with the patient and are negative.  MEDICAL HISTORY:  Past Medical History:  Diagnosis Date  . Anemia    with chemo  . Cancer ([place]) 04/2017   Bil Breast Ca  . Family history of prostate cancer   . Family history of uterine cancer   . History of radiation therapy 09/25/17- 11/05/17   Left Breast/ 50 Gy in 25 fractions, right breast/ 50 Gy in 25 fractions, right SCLV/ 45 Gy in 25 fractions, right breast boost/ 10 Gy in 5 fractions.   . Hyperlipidemia   . Hypertension   . Personal history of chemotherapy   .  Personal history of radiation therapy   . PONV (postoperative nausea and vomiting)    nausea only    SURGICAL HISTORY: Past Surgical History:  Procedure Laterality Date  . BREAST BIOPSY    . BREAST LUMPECTOMY Left    2019  . BREAST LUMPECTOMY WITH RADIOACTIVE SEED AND SENTINEL LYMPH NODE BIOPSY Left 08/22/2017   Procedure: LEFT BREAST LUMPECTOMY WITH RADIOACTIVE SEED AND SENTINEL LYMPH NODE BIOPSY ERAS PATHWAY;  Surgeon: [name], MD;  Location: [facility];  Service: General;  Laterality: Left;  PECTORAL BLOCK  . COLONOSCOPY WITH PROPOFOL Left 07/14/2017   Procedure: COLONOSCOPY WITH PROPOFOL;  Surgeon: Ronnette Juniper, MD;  Location: [facility];  Service: Gastroenterology;  Laterality: Left;  . DILATION AND CURETTAGE OF UTERUS    . ESOPHAGOGASTRODUODENOSCOPY N/A 07/13/2017   Procedure: ESOPHAGOGASTRODUODENOSCOPY (EGD);  Surgeon: Ronnette Juniper, MD;  Location: [facility];  Service: Gastroenterology;  Laterality: N/A;  . MASTECTOMY Right    2019  . MASTECTOMY W/ SENTINEL NODE BIOPSY Right 08/22/2017   Procedure: RIGHT MASTECTOMY WITH SENTINEL LYMPH NODE BIOPSY ERAS PATHWAY;  Surgeon: [name], MD;  Location: [facility];  Service: General;  Laterality: Right;  PECTORAL BLOCK  . PORT-A-CATH REMOVAL Right 11/06/2017   Procedure: REMOVAL PORT-A-CATH;  Surgeon: [name], MD;  Location: [place];  Service: General;  Laterality: Right;  . PORTACATH PLACEMENT Right 05/14/2017   Procedure: ULTRASOUND GUIDED PORT PLACEMENT;  Surgeon: [name], MD;  Location: [facility];  Service: General;  Laterality: Right;    I have reviewed the social history and family history with the patient and they are unchanged from previous note.  ALLERGIES:  has No Known Allergies.  MEDICATIONS:  Current Outpatient Medications  Medication Sig Dispense Refill  . aspirin EC 81 MG tablet Take 81 mg by mouth daily.    . Biotin 10 MG CAPS Take by mouth.    . Calcium  Citrate-Vitamin D 500-500 MG-UNIT PACK Take 2 tablets by mouth daily.    Marland Kitchen HYDROcodone-acetaminophen (NORCO/VICODIN) 5-325 MG tablet Take 1 tablet by mouth every 6 (six) hours as needed for moderate pain. 12 tablet 0  . irbesartan (AVAPRO) 150 MG tablet Take 150 mg by mouth daily.     Marland Kitchen letrozole (FEMARA) 2.5 MG tablet Take 1 tablet (2.5 mg total) by mouth daily. 90 tablet 3  . loperamide (IMODIUM) 2 MG capsule Take 2-4 mg by mouth 4 (four) times daily as needed for diarrhea or loose stools.     Marland Kitchen loratadine (CLARITIN) 10 MG tablet Take 10 mg by mouth daily as needed for allergies.    . Multiple Vitamin (MULTIVITAMIN) tablet Take 1 tablet daily by mouth.    . simvastatin (ZOCOR) 40 MG tablet Take 40 mg at bedtime by mouth.    Marland Kitchen  VITAMIN B COMPLEX-C PO Take by mouth.     No current facility-administered medications for this visit.     PHYSICAL EXAMINATION: ECOG PERFORMANCE STATUS: 0 - Asymptomatic  Vitals:   06/23/18 1028  BP: (!) 146/98  Pulse: 67  Resp: 18  Temp: 98.4 F (36.9 C)  SpO2: 99%   Filed Weights   06/23/18 1028  Weight: 127 lb (57.6 kg)    GENERAL:alert, no distress and comfortable SKIN: skin color, texture, turgor are normal, no rashes or significant lesions EYES: normal, Conjunctiva are pink and non-injected, sclera clear OROPHARYNX:no exudate, no erythema and lips, buccal mucosa, and tongue normal  NECK: supple, thyroid normal size, non-tender, without nodularity LYMPH:  no palpable lymphadenopathy in the cervical, axillary or inguinal LUNGS: clear to auscultation and percussion with normal breathing effort HEART: regular rate & rhythm and no murmurs and no lower extremity edema ABDOMEN:abdomen soft, non-tender and normal bowel sounds Musculoskeletal:no cyanosis of digits and no clubbing  NEURO: alert & oriented x 3 with fluent speech, no focal motor/sensory deficits BREAST: s/p right mastectomy and left breast lumpectomy: Surgical incisions healed with mild  tenderness and mild swelling of left breast incision.   LABORATORY DATA:  I have reviewed the data as listed CBC Latest Ref Rng & Units 06/23/2018 03/21/2018 02/05/2018  WBC 4.0 - 10.5 K/uL 3.8(L) 3.0(L) 3.2(L)  Hemoglobin 12.0 - 15.0 g/dL 14.5 13.9 13.4  Hematocrit 36.0 - 46.0 % 43.6 41.9 40.4  Platelets 150 - 400 K/uL 195 190 195     CMP Latest Ref Rng & Units 06/23/2018 03/21/2018 02/05/2018  Glucose 70 - 99 mg/dL 97 99 105(H)  BUN 8 - 23 mg/dL _0 Creatinine 0.44 - 1.00 mg/dL 0.84 0.86 0.83  Sodium 135 - 145 mmol/L 144 145 144  Potassium 3.5 - 5.1 mmol/L 4.4 4.4 4.3  Chloride 98 - 111 mmol/L 108 109 108  CO2 22 - 32 mmol/L _1 Calcium 8.9 - 10.3 mg/dL 9.9 9.9 9.6  Total Protein 6.5 - 8.1 g/dL 6.9 7.1 6.7  Total Bilirubin 0.3 - 1.2 mg/dL 0.4 0.3 0.6  Alkaline Phos 38 - 126 U/L 76 95 89  AST 15 - 41 U/L 32 30 29  ALT 0 - 44 U/L 40 27 30      RADIOGRAPHIC STUDIES: I have personally reviewed the radiological images as listed and agreed with the findings in the report. No results found.   ASSESSMENT & PLAN:  Madison Stokes is a 72 y.o. female with   1. 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 She underwent neoadjuvant TCHP and maintenance Herceptin/Perjeta, s/p right mastectomy, left breast lumpectomy and B/l adjuvant Radiation.  -She attended Survivorship Clinic with NP Wilber Bihari on 02/05/18. -she was screened for the NATALEE clinical trial but did not qualify  -She is currently on Letrozole since 07/2017 and tolerating well with manageable hot flashes. -due to her elevated tumor  marker, she had PET scan in 12/2017 which was negative  -She is clinically doing well. Lab reviewed, her CBC and CMP are within normal limits except WBC at 3.8. Her Ca 27.29 is  still pending. Her physical exam and her 04/2018 mammogram were unremarkable. There is no clinical concern for recurrence. -She will continue Letrozole daily, refilled today  -Next Mammogram in 04/2018   -F/u in 09/2018    2. 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 Otherwise her results were negative.    3. Bosniak category 34F cyst in the right mid kidney, 1.2cm Non-obstructive kidney stone -found on 05/22/17 CT Scan  -Her 12/17/17 CT AP showed stable renal and hepatic cysts. Will continue to monitor.    4. Osteopenia  -Her 01/2018 DEXA showed osteopenia with the lowest T-Score at AP Spine at -2.  -She is on Zometa injections since 03/21/18 and continue every 6 months for 2 years  -Continue Calcium and VitD supplement daily.   PLAN:  -Continue Letrozole  -Lab, f/u and zometa the first week of April 2020     No problem-specific Assessment & Plan notes found for this encounter.   No orders of the defined types were placed in this encounter.  All questions were answered. The patient knows to call the clinic with any problems, questions or concerns. No barriers to learning was detected. I spent 20 minutes counseling the patient face to face. The total time spent in the appointment was 25 minutes and more than 50% was on counseling and review of test results     [name], MD 06/23/2018   I, Joslyn Devon, am acting as scribe for [name], MD.   I have reviewed the above documentation for accuracy and completeness, and I agree with the above.

## 2018-06-23 ENCOUNTER — Inpatient Hospital Stay: Payer: Medicare Other | Attending: Hematology

## 2018-06-23 ENCOUNTER — Other Ambulatory Visit: Payer: Self-pay | Admitting: Hematology

## 2018-06-23 ENCOUNTER — Telehealth: Payer: Self-pay | Admitting: Hematology

## 2018-06-23 ENCOUNTER — Inpatient Hospital Stay (HOSPITAL_BASED_OUTPATIENT_CLINIC_OR_DEPARTMENT_OTHER): Payer: Medicare Other | Admitting: Hematology

## 2018-06-23 VITALS — BP 146/98 | HR 67 | Temp 98.4°F | Resp 18 | Ht 62.0 in | Wt 127.0 lb

## 2018-06-23 DIAGNOSIS — N951 Menopausal and female climacteric states: Secondary | ICD-10-CM

## 2018-06-23 DIAGNOSIS — Z170 Estrogen receptor positive status [ER+]: Secondary | ICD-10-CM | POA: Insufficient documentation

## 2018-06-23 DIAGNOSIS — R202 Paresthesia of skin: Secondary | ICD-10-CM | POA: Diagnosis not present

## 2018-06-23 DIAGNOSIS — C50112 Malignant neoplasm of central portion of left female breast: Secondary | ICD-10-CM

## 2018-06-23 DIAGNOSIS — C50811 Malignant neoplasm of overlapping sites of right female breast: Secondary | ICD-10-CM

## 2018-06-23 DIAGNOSIS — M8580 Other specified disorders of bone density and structure, unspecified site: Secondary | ICD-10-CM | POA: Diagnosis not present

## 2018-06-23 DIAGNOSIS — N281 Cyst of kidney, acquired: Secondary | ICD-10-CM | POA: Insufficient documentation

## 2018-06-23 LAB — CBC WITH DIFFERENTIAL (CANCER CENTER ONLY)
Abs Immature Granulocytes: 0.01 10*3/uL (ref 0.00–0.07)
Basophils Absolute: 0.1 10*3/uL (ref 0.0–0.1)
Basophils Relative: 1 %
Eosinophils Absolute: 0.1 10*3/uL (ref 0.0–0.5)
Eosinophils Relative: 3 %
HCT: 43.6 % (ref 36.0–46.0)
Hemoglobin: 14.5 g/dL (ref 12.0–15.0)
Immature Granulocytes: 0 %
Lymphocytes Relative: 22 %
Lymphs Abs: 0.8 10*3/uL (ref 0.7–4.0)
MCH: 31.9 pg (ref 26.0–34.0)
MCHC: 33.3 g/dL (ref 30.0–36.0)
MCV: 95.8 fL (ref 80.0–100.0)
Monocytes Absolute: 0.7 10*3/uL (ref 0.1–1.0)
Monocytes Relative: 17 %
Neutro Abs: 2.1 10*3/uL (ref 1.7–7.7)
Neutrophils Relative %: 57 %
Platelet Count: 195 10*3/uL (ref 150–400)
RBC: 4.55 MIL/uL (ref 3.87–5.11)
RDW: 13.5 % (ref 11.5–15.5)
WBC Count: 3.8 10*3/uL — ABNORMAL LOW (ref 4.0–10.5)
nRBC: 0 % (ref 0.0–0.2)

## 2018-06-23 LAB — CMP (CANCER CENTER ONLY)
ALT: 40 U/L (ref 0–44)
AST: 32 U/L (ref 15–41)
Albumin: 4 g/dL (ref 3.5–5.0)
Alkaline Phosphatase: 76 U/L (ref 38–126)
Anion gap: 10 (ref 5–15)
BUN: 19 mg/dL (ref 8–23)
CO2: 26 mmol/L (ref 22–32)
Calcium: 9.9 mg/dL (ref 8.9–10.3)
Chloride: 108 mmol/L (ref 98–111)
Creatinine: 0.84 mg/dL (ref 0.44–1.00)
GFR, Est AFR Am: >60 mL/min (ref >60)
GFR, Estimated: >60 mL/min (ref >60)
Glucose, Bld: 97 mg/dL (ref 70–99)
Potassium: 4.4 mmol/L (ref 3.5–5.1)
Sodium: 144 mmol/L (ref 135–145)
Total Bilirubin: 0.4 mg/dL (ref 0.3–1.2)
Total Protein: 6.9 g/dL (ref 6.5–8.1)

## 2018-06-23 NOTE — Telephone Encounter (Signed)
Printed calendar and avs. °

## 2018-06-24 ENCOUNTER — Encounter: Payer: Self-pay | Admitting: Hematology

## 2018-06-24 LAB — CANCER ANTIGEN 27.29: CA 27.29: 82.5 U/mL — ABNORMAL HIGH (ref 0.0–38.6)

## 2018-06-26 ENCOUNTER — Telehealth: Payer: Self-pay

## 2018-06-26 NOTE — Telephone Encounter (Signed)
-----   Message from [name], MD sent at 06/25/2018  9:16 AM EST ----- Please let pt know her CA27.29 is stable, no concerns, thanks   [name]  06/25/2018

## 2018-06-26 NOTE — Telephone Encounter (Signed)
Left voice message for patient per Dr. Burr Medico that CA27.29 is stable, no concerns.

## 2018-08-25 DIAGNOSIS — F5101 Primary insomnia: Secondary | ICD-10-CM | POA: Diagnosis not present

## 2018-08-25 DIAGNOSIS — R7303 Prediabetes: Secondary | ICD-10-CM | POA: Diagnosis not present

## 2018-08-25 DIAGNOSIS — C50811 Malignant neoplasm of overlapping sites of right female breast: Secondary | ICD-10-CM | POA: Diagnosis not present

## 2018-08-25 DIAGNOSIS — I10 Essential (primary) hypertension: Secondary | ICD-10-CM | POA: Diagnosis not present

## 2018-08-25 DIAGNOSIS — E7800 Pure hypercholesterolemia, unspecified: Secondary | ICD-10-CM | POA: Diagnosis not present

## 2018-09-21 ENCOUNTER — Encounter: Payer: Self-pay | Admitting: Hematology

## 2018-09-23 NOTE — Progress Notes (Signed)
Madison Stokes   [phone] [phone]   Clinic Follow up Note   Patient Care Team: [name], MD as PCP - General (Family Medicine) [name], MD as Consulting Physician (Hematology) [name], NP as Nurse Practitioner (Nurse Practitioner) [name], MD as Consulting Physician (General Surgery) Madison Bison Charlestine Massed, NP as Nurse Practitioner (Hematology and Oncology)  I connected with Madison Stokes on 09/25/2018 at  9:30 AM EDT by telephone and verified that I am speaking with the correct person using two identifiers.   I discussed the limitations, risks, security and privacy concerns of performing an evaluation and management service by telephone and the availability of in person appointments. I also discussed with the patient that there may be a patient responsible charge related to this service. The patient expressed understanding and agreed to proceed.   CHIEF COMPLAINT: F/u of bilateral breast cancer  SUMMARY OF ONCOLOGIC HISTORY: Oncology History   Cancer Staging Cancer of central portion of left breast ([name]) Staging form: Breast, AJCC 8th Edition - Clinical stage from 05/03/2017: Stage IA (cT1a, cN0, cM0, G1, ER: Positive, PR: Positive, HER2: Negative) - Signed by [name], MD on 05/07/2017 - Pathologic stage from 08/22/2017: No Stage Recommended (ypT0, pN0, cM0, GX, ER: Unknown, PR: Unknown, HER2: Unknown) - Signed by [name], MD on 11/06/2017  Cancer of overlapping sites of right female breast [facility]) Staging form: Breast, AJCC 8th Edition - Clinical stage from 04/24/2017: Stage IIA (cT3, cN0, cM0, G2, ER+, PR+, HER2-) - Signed by [name], MD on 11/06/2017 - Pathologic stage from 08/22/2017: No Stage Recommended (ypT3, pN0(i+), cM0, G2, ER+, PR+, HER2-) - Signed by [name], MD on 10/29/2017       Cancer of overlapping sites of right female breast ([place])   03/08/2017 Mammogram    IMPRESSION: 1. Patient has a is palpable  mass in the lateral right breast, in the location dense fibroglandular breast tissue. Although no discrete mass is seen sonographically or mammographically, the Clinical change remains concerning. Biopsy is recommended.  RECOMMENDATION: 1. Ultrasound-guided core needle biopsy of palpable abnormality in the lateral right breast.    04/24/2017 Breast US    IMPRESSION: Ultrasound guided biopsy of the right breast. No apparent complications.    04/24/2017 Initial Biopsy    Breast, right, needle core biopsy, UOQ, centered at 9:30 o'clock - INVASIVE MAMMARY CARCINOMA. - SEE COMMENT. ER 100% positive PR 100% positive Ki67 10% HER2 FISH positive (ratio 2.10, copy number 3.30)  HER2 IHC was done on the biopsy sample after surgery, and was negative (1+). So the final HER2 is NEGATIVE (determined after surgery)    05/02/2017 Breast MRI    IMPRESSION: 1. 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 2. 4 mm enhancing mass with associated architectural distortion in the central left breast. This is suspicious for additional focus of carcinoma.    05/07/2017 Initial Diagnosis    Cancer of overlapping sites of right female breast ([name])    05/15/2017 Echocardiogram    ECHO 05/15/17  Impressions: - LVEF 60-65%, normal wall thickness, normal wall motion and GLS   strain, grade 1 DD, normal LV filling pressure, mild MR, normal   LA size, normal IVC.       05/22/2017 Imaging    CT CAP W Contrast 05/22/17 IMPRESSION: 1. No specific CT findings of nodal or other metastatic disease in the chest, abdomen, and pelvis.  2. Bosniak category 31F cyst in the right mid kidney, with mild enhancement along a septation but without nodularity. Unless follow up abdominal scans related to the patient's breast cancer adequately characterize this lesion, renal protocol MRI or CT scan would be recommended in 6 months time. 3. Other  imaging findings of potential clinical significance: The Aortic Atherosclerosis (ICD10-I70.0). Ectatic ascending thoracic aorta without aneurysm. Small type 1 hiatal hernia. Several small hypodense liver lesions are likely benign although technically too small to characterize. Nonobstructive 1.2 cm right renal calculus. Anterior uterine fundal myometrial mass favoring fibroid. Pelvic floor laxity with small cystocele. Notable spondylosis at L3-4 and L5-S1.     05/22/2017 Imaging    Bone Scan Whole Body 05/22/17 IMPRESSION: 1. No findings of osseous metastatic disease. 2. Lower lumbar activity corresponds to areas of significant benign spondylosis on the CT scan.    05/24/2017 - 09/05/2017 Chemotherapy    TCHP every 3 weeks for 6 cycles starting 05/24/17 followed by a year of maintenance Herceptin and Perjeta. Given lack of response she stopped TCHP after 4 cycles on 07/26/17.  She will proceeded with Maintenance Herceptin and Perjeta on 08/16/17 and 09/05/17 only due to hesurgical smaple being HER2 negative      07/12/2017 - 07/14/2017 Hospital Admission    Admit date: 07/12/17 Admission diagnosis: GI Hemorrhaging  Additional comments: Colonoscopy and EGD done with no evidence of bleeding site. Given blood transfusion on 07/15/17    08/02/2017 Imaging    MRI Breast Bilateral 2/16//18 IMPRESSION: 1. The known lobular carcinoma of the right breast has not significantly changed since the prior MRI from 05/02/2017. The disease spans approximately 5.6 cm, previously 5.9 cm in the anterior to posterior dimension. 2. 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 3.  No new suspicious findings in either breast. 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    08/16/2017 -  Anti-estrogen oral  therapy    Letrozole once daily starting 08/16/17     08/22/2017 Surgery     RIGHT MASTECTOMY WITH SENTINEL LYMPH NODE BIOPSY ERAS PATHWAY by Dr. Georgette Dover    08/22/2017 Pathology Results    See the oncology history under left breast cancer    09/25/2017 - 11/05/2017 Radiation Therapy    She has completed bilateral adjuvant radiation 09/25/17-11/05/17 managed by Dr. Isidore Moos. She tolerated well.    12/17/2017 Imaging    CT AP W Contrast 12/17/17 IMPRESSION: 1. Stable bilateral renal cysts. No change since prior examination and no worrisome CT imaging features. Stable lower pole right renal cyst. 2. No acute abdominal/pelvic findings or lymphadenopathy. 3. Stable small hepatic cysts. 4. Stable uterine fibroids.    01/22/2018 PET scan    PET 01/22/18 IMPRESSION: 1. Status post right mastectomy and left breast lumpectomy. 2. Minimal left axillary scarring changes near a surgical clip. No findings suspicious for chest wall tumor or axillary or supraclavicular adenopathy. 3. 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  The Hereditary Gene Panel offered by [facility] includes sequencing and/or deletion duplication testing of the following 47 genes: APC, ATM, AXIN2, BARD1, BMPR1A, BRCA1, BRCA2, BRIP1, CDH1, CDK4, CDKN2A (p14ARF), CDKN2A (p16INK4a), CHEK2, CTNNA1, DICER1, EPCAM (Deletion/duplication testing only), GREM1 (  promoter region deletion/duplication testing only), KIT, MEN1, MLH1, MSH2, MSH3, MSH6, MUTYH, NBN, NF1, NHTL1, PALB2, PDGFRA, PMS2, POLD1, POLE, PTEN, RAD50, RAD51C, RAD51D, SDHB, SDHC, SDHD, SMAD4, SMARCA4. STK11, TP53, TSC1, TSC2, and VHL.  The following genes were evaluated for sequence changes only: SDHA and HOXB13 c.251G>A variant only. The report date is February 15, 2018.     Cancer of central portion of left breast ([place])    05/02/2017 Breast MRI    IMPRESSION: 1. 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 2. 4 mm enhancing mass with associated architectural distortion in the central left breast. This is suspicious for additional focus of carcinoma.    05/03/2017 Mammogram    IMPRESSION: 1. Suspicious area of architectural distortion in the left breast which corresponds to a small enhancing mass on MRI.  RECOMMENDATION: Stereotactic core needle biopsy of the area of architectural distortion in the left breast.     05/03/2017 Breast US    Stereotactic core needle biopsy of the area of architectural distortion in the left breast.    05/03/2017 Initial Biopsy    Breast, left, needle core biopsy, central, slightly toward the lower, outer quadrant - INVASIVE DUCTAL CARCINOMA, MSBR GRADE 1/2. - SEE MICROSCOPIC DESCRIPTION. ER 50% positive PR 90% positive Ki67: 1% HER2 negative     05/07/2017 Initial Diagnosis    Cancer of central portion of left breast ([place])    08/22/2017 Surgery    LEFT BREAST LUMPECTOMY WITH RADIOACTIVE SEED AND SENTINEL LYMPH NODE BIOPSY ERAS PATHWAY by Dr. Georgette Dover 08/22/17     08/22/2017 Pathology Results    Diagnosis 08/22/17 1. Breast, lumpectomy, Left - ATYPICAL DUCTAL HYPERPLASIA - FIBROCYSTIC CHANGES - PREVIOUS BIOPSY SITE CHANGES - SEE COMMENT 2. Lymph node, sentinel, biopsy, a total of 4 nodes, all negative for malignant cells  6. Breast, simple mastectomy, Right - INVASIVE LOBULAR CARCINOMA, [place] GRADE 2/3, 6.0 CM - LOBULAR NEOPLASIA (ATYPICAL LOBULAR HYPERPLASIA) - PREVIOUS BIOPSY SITE CHANGES 7. 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  The Hereditary Gene Panel offered by [facility] includes sequencing and/or deletion  duplication testing of the following 47 genes: APC, ATM, AXIN2, BARD1, BMPR1A, BRCA1, BRCA2, BRIP1, CDH1, CDK4, CDKN2A (p14ARF), CDKN2A (p16INK4a), CHEK2, CTNNA1, DICER1, EPCAM (Deletion/duplication testing only), GREM1 (promoter region deletion/duplication testing only), KIT, MEN1, MLH1, MSH2, MSH3, MSH6, MUTYH, NBN, NF1, NHTL1, PALB2, PDGFRA, PMS2, POLD1, POLE, PTEN, RAD50, RAD51C, RAD51D, SDHB, SDHC, SDHD, SMAD4, SMARCA4. STK11, TP53, TSC1, TSC2, and VHL.  The following genes were evaluated for sequence changes only: SDHA and HOXB13 c.251G>A variant only. The report date is February 15, 2018.      CURRENT THERAPY:  -adjuvant Letrozole once daily started 08/16/17 -Zometa injections every 6 months for 2 years starting 03/21/18   INTERVAL HISTORY:  Madison Stokes is here for a follow up of b/l breast cancer. She was able to identify herself by birthdate. She notes she is doing well. She notes she is tolerating letrozole well with no joint pain and occasional hot flashes with are manageable. She notes after gaining some weight she is trying to lose weight now. She tries to exercise. Her next infusion is in 11/2018 due to COVID-19. She still takes calcium and Vit d.  She last  saw her PCP 2 months ago. She notes her DM is doing well. I reviewed her medication list with her. There are no current changes.     REVIEW OF SYSTEMS:   Constitutional: Denies fevers, chills or abnormal weight loss (+) Occasional hot flashes  Eyes: Denies blurriness of vision Ears, nose, mouth, throat, and face: Denies mucositis or sore throat Respiratory: Denies cough, dyspnea or wheezes Cardiovascular: Denies palpitation, chest discomfort or lower extremity swelling Gastrointestinal:  Denies nausea, heartburn or change in bowel habits Skin: Denies abnormal skin rashes Lymphatics: Denies new lymphadenopathy or easy bruising Neurological:Denies numbness, tingling or new weaknesses Behavioral/Psych: Mood is stable, no new  changes  All other systems were reviewed with the patient and are negative.  MEDICAL HISTORY:  Past Medical History:  Diagnosis Date  . Anemia    with chemo  . Cancer ([place]) 04/2017   Bil Breast Ca  . Family history of prostate cancer   . Family history of uterine cancer   . History of radiation therapy 09/25/17- 11/05/17   Left Breast/ 50 Gy in 25 fractions, right breast/ 50 Gy in 25 fractions, right SCLV/ 45 Gy in 25 fractions, right breast boost/ 10 Gy in 5 fractions.   . Hyperlipidemia   . Hypertension   . Personal history of chemotherapy   . Personal history of radiation therapy   . PONV (postoperative nausea and vomiting)    nausea only    SURGICAL HISTORY: Past Surgical History:  Procedure Laterality Date  . BREAST BIOPSY    . BREAST LUMPECTOMY Left    2019  . BREAST LUMPECTOMY WITH RADIOACTIVE SEED AND SENTINEL LYMPH NODE BIOPSY Left 08/22/2017   Procedure: LEFT BREAST LUMPECTOMY WITH RADIOACTIVE SEED AND SENTINEL LYMPH NODE BIOPSY ERAS PATHWAY;  Surgeon: [name], MD;  Location: [facility];  Service: General;  Laterality: Left;  PECTORAL BLOCK  . COLONOSCOPY WITH PROPOFOL Left 07/14/2017   Procedure: COLONOSCOPY WITH PROPOFOL;  Surgeon: Ronnette Juniper, MD;  Location: [facility];  Service: Gastroenterology;  Laterality: Left;  . DILATION AND CURETTAGE OF UTERUS    . ESOPHAGOGASTRODUODENOSCOPY N/A 07/13/2017   Procedure: ESOPHAGOGASTRODUODENOSCOPY (EGD);  Surgeon: Ronnette Juniper, MD;  Location: [facility];  Service: Gastroenterology;  Laterality: N/A;  . MASTECTOMY Right    2019  . MASTECTOMY W/ SENTINEL NODE BIOPSY Right 08/22/2017   Procedure: RIGHT MASTECTOMY WITH SENTINEL LYMPH NODE BIOPSY ERAS PATHWAY;  Surgeon: [name], MD;  Location: [facility];  Service: General;  Laterality: Right;  PECTORAL BLOCK  . PORT-A-CATH REMOVAL Right 11/06/2017   Procedure: REMOVAL PORT-A-CATH;  Surgeon: [name], MD;  Location: [facility];  Service: General;  Laterality:  Right;  . PORTACATH PLACEMENT Right 05/14/2017   Procedure: ULTRASOUND GUIDED PORT PLACEMENT;  Surgeon: [name], MD;  Location: [facility];  Service: General;  Laterality: Right;    I have reviewed the social history and family history with the patient and they are unchanged from previous note.  ALLERGIES:  has No Known Allergies.  MEDICATIONS:  Current Outpatient Medications  Medication Sig Dispense Refill  . aspirin EC 81 MG tablet Take 81 mg by mouth daily.    . Biotin 10 MG CAPS Take by mouth.    . Calcium Citrate-Vitamin D 500-500 MG-UNIT PACK Take 2 tablets by mouth daily.    Marland Kitchen HYDROcodone-acetaminophen (NORCO/VICODIN) 5-325 MG tablet Take 1 tablet by mouth every 6 (six) hours as needed for moderate pain. 12 tablet 0  . irbesartan (AVAPRO) 150 MG  tablet Take 150 mg by mouth daily.     Marland Kitchen letrozole (FEMARA) 2.5 MG tablet Take 1 tablet (2.5 mg total) by mouth daily. 90 tablet 3  . loperamide (IMODIUM) 2 MG capsule Take 2-4 mg by mouth 4 (four) times daily as needed for diarrhea or loose stools.     Marland Kitchen loratadine (CLARITIN) 10 MG tablet Take 10 mg by mouth daily as needed for allergies.    . Multiple Vitamin (MULTIVITAMIN) tablet Take 1 tablet daily by mouth.    . simvastatin (ZOCOR) 40 MG tablet Take 40 mg at bedtime by mouth.    Marland Kitchen VITAMIN B COMPLEX-C PO Take by mouth.     No current facility-administered medications for this visit.     PHYSICAL EXAMINATION: ECOG PERFORMANCE STATUS: 0 - Asymptomatic  -No vitals taken today   GENERAL:alert, no distress and comfortable SKIN: skin color, texture, turgor are normal, no rashes or significant lesions EYES: normal, Conjunctiva are pink and non-injected, sclera clear OROPHARYNX:no exudate, no erythema and lips, buccal mucosa, and tongue normal  NECK: supple, thyroid normal size, non-tender, without nodularity LYMPH:  no palpable lymphadenopathy in the cervical, axillary or inguinal LUNGS: clear to auscultation  and percussion with normal breathing effort HEART: regular rate & rhythm and no murmurs and no lower extremity edema ABDOMEN:abdomen soft, non-tender and normal bowel sounds Musculoskeletal:no cyanosis of digits and no clubbing  NEURO: alert & oriented x 3 with fluent speech, no focal motor/sensory deficits -Virtual exam deferred today   LABORATORY DATA:  I have reviewed the data as listed CBC Latest Ref Rng & Units 06/23/2018 03/21/2018 02/05/2018  WBC 4.0 - 10.5 K/uL 3.8(L) 3.0(L) 3.2(L)  Hemoglobin 12.0 - 15.0 g/dL 14.5 13.9 13.4  Hematocrit 36.0 - 46.0 % 43.6 41.9 40.4  Platelets 150 - 400 K/uL 195 190 195     CMP Latest Ref Rng & Units 06/23/2018 03/21/2018 02/05/2018  Glucose 70 - 99 mg/dL 97 99 105(H)  BUN 8 - 23 mg/dL '19 22 21  ' Creatinine 0.44 - 1.00 mg/dL 0.84 0.86 0.83  Sodium 135 - 145 mmol/L 144 145 144  Potassium 3.5 - 5.1 mmol/L 4.4 4.4 4.3  Chloride 98 - 111 mmol/L 108 109 108  CO2 22 - 32 mmol/L '26 27 26  ' Calcium 8.9 - 10.3 mg/dL 9.9 9.9 9.6  Total Protein 6.5 - 8.1 g/dL 6.9 7.1 6.7  Total Bilirubin 0.3 - 1.2 mg/dL 0.4 0.3 0.6  Alkaline Phos 38 - 126 U/L 76 95 89  AST 15 - 41 U/L 32 30 29  ALT 0 - 44 U/L 40 27 30      RADIOGRAPHIC STUDIES: I have personally reviewed the radiological images as listed and agreed with the findings in the report. No results found.   ASSESSMENT & PLAN:  Madison Stokes is a 73 y.o. female with   1. 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 She underwent neoadjuvant TCHP and maintenance Herceptin/Perjeta, s/p right mastectomy, left breast lumpectomy and  B/l adjuvant Radiation.  -She started anti-estrogen therapy with Letrozole in 07/2017. She is tolerating well with occasional hot flashes, improving.  -She attended [facility]  with NP Wilber Bihari on 02/05/18. -she was previously screened for the NATALEE clinical trial but did not qualify.  -She is clinically doing well. Her 04/2018 mammogram were unremarkable. She is asymptomatic. There is no clinical concern for recurrence. -due to the current COVID 19 pandemic, I encouraged her to continue social distancing. She completed chemo 1 year ago and will be immunocompromised for 3 years post chemo treatment. I reviewed COVID-19 precautions.  -Continue Letrozole  -Next Mammogram in 04/2019  -F/u in 3-4 months with lab and injection    2. Genetics  -Her 02/05/18 results showed positive forRAD51C c.1043A>T (p.Asp348Val) VUS identified on the common hereditary cancer panel.Otherwise her results were negative.   3. Bosniak category 69F cyst in the right mid kidney, 1.2cm Non-obstructive kidney stone -found on 05/22/17 CT Scan  -Her 12/17/17 CT AP showed stable renal and hepatic cysts. Will continue to monitor.   4.Osteopenia -Her 01/2018 DEXA showed osteopenia with the lowest T-Score at AP Spine at -2.  -She is on Zometa injectionssince 9/27/19and continue every 6 months for 2 years -Continue Calcium and VitD supplement daily. -will postpone next Zometa injection to 12/2018 due to COVID-19  PLAN:  -Continue Letrozole  -Lab and f/u and zometa infusion in 3 months     No problem-specific Assessment & Plan notes found for this encounter.   No orders of the defined types were placed in this encounter.  All questions were answered. The patient knows to call the clinic with any problems, questions or concerns. No barriers to learning was detected. I spent 10 minutes counseling the patient on the phone. The total time spent in the appointment was 15 minutes.     [name],  MD 09/25/2018   I, Joslyn Devon, am acting as scribe for [name], MD.   I have reviewed the above documentation for accuracy and completeness, and I agree with the above.

## 2018-09-25 ENCOUNTER — Inpatient Hospital Stay: Payer: Medicare Other | Attending: Hematology | Admitting: Hematology

## 2018-09-25 ENCOUNTER — Other Ambulatory Visit: Payer: Medicare Other

## 2018-09-25 ENCOUNTER — Ambulatory Visit: Payer: Medicare Other

## 2018-09-25 ENCOUNTER — Telehealth: Payer: Self-pay | Admitting: Hematology

## 2018-09-25 ENCOUNTER — Encounter: Payer: Self-pay | Admitting: Hematology

## 2018-09-25 DIAGNOSIS — C50811 Malignant neoplasm of overlapping sites of right female breast: Secondary | ICD-10-CM

## 2018-09-25 DIAGNOSIS — Z170 Estrogen receptor positive status [ER+]: Secondary | ICD-10-CM | POA: Diagnosis not present

## 2018-09-25 DIAGNOSIS — C50112 Malignant neoplasm of central portion of left female breast: Secondary | ICD-10-CM | POA: Diagnosis not present

## 2018-09-25 NOTE — Telephone Encounter (Signed)
Called and scheduled appt per 4/2 sch message.  Patient aware of scheduled appt date and time.

## 2018-09-29 ENCOUNTER — Other Ambulatory Visit: Payer: Self-pay

## 2018-09-29 DIAGNOSIS — Z170 Estrogen receptor positive status [ER+]: Secondary | ICD-10-CM

## 2018-09-29 DIAGNOSIS — C50811 Malignant neoplasm of overlapping sites of right female breast: Secondary | ICD-10-CM

## 2018-09-29 MED ORDER — LETROZOLE 2.5 MG PO TABS: 2.5000 mg | ORAL_TABLET | Freq: Every day | ORAL | 3 refills | Status: DC

## 2018-09-30 IMAGING — MR MR BILATERAL BREAST WITHOUT AND WITH CONTRAST
7 of 12 series · 25 of 48 positions shown · IV contrast (10 ml multihance)
Comparison: Previous exam(s).

CLINICAL DATA: 71-year-old female diagnosed with invasive lobular
carcinoma of the right breast after initially presenting with a
palpable lump in the right breast. Prior MRI indicated a 5.9 cm span
of malignant non mass enhancement in the right breast, as well as a
suspicious area of distortion in the left breast. Following workup
and biopsy, this area in the left breast was found to be a grade 1-2
invasive ductal carcinoma in the central slightly lower outer
quadrant.

LABS:  Creatinine of 1.00 and GFR of 56 on 08/02/2017.
EXAM:
BILATERAL BREAST MRI WITH AND WITHOUT CONTRAST
TECHNIQUE: Multiplanar, multisequence MR images of both breasts were obtained
prior to and following the intravenous administration of 10 ml of
MultiHance.

[Series 2: STIR · axial · 3.0mm · 0.94mm/px · 1 of 64 slices shown]
[im 1/64]
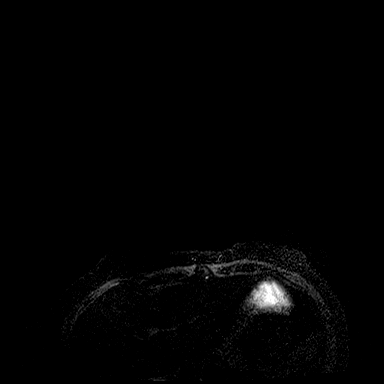

[Series 3: fl3d pre no · axial · non-contrast · 1.0mm · 0.80mm/px · z∈[-108,+99]mm · 5 of 208 slices shown]
[im 1/208]
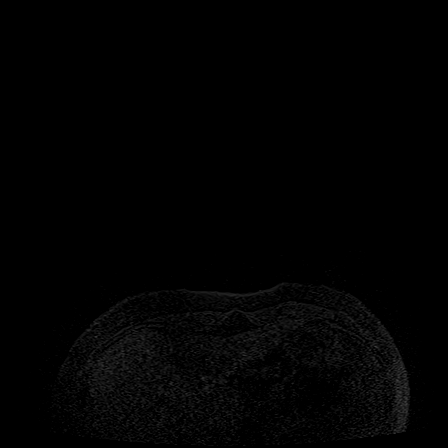
[im 52/208]
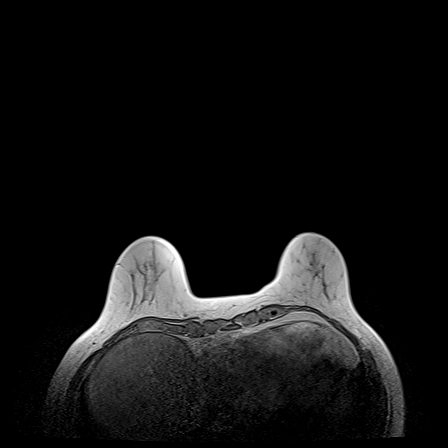
[im 104/208]
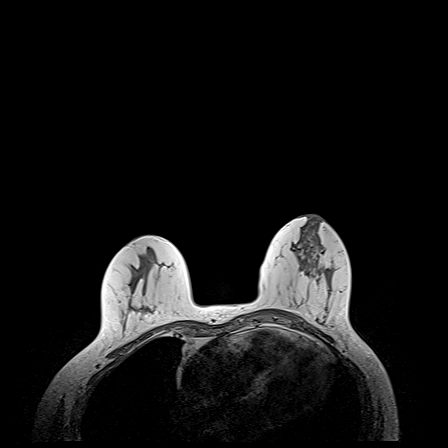
[im 156/208]
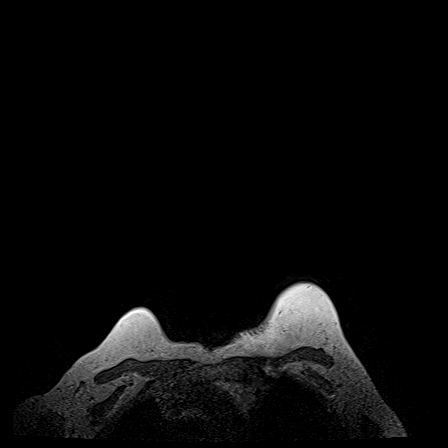
[im 208/208]
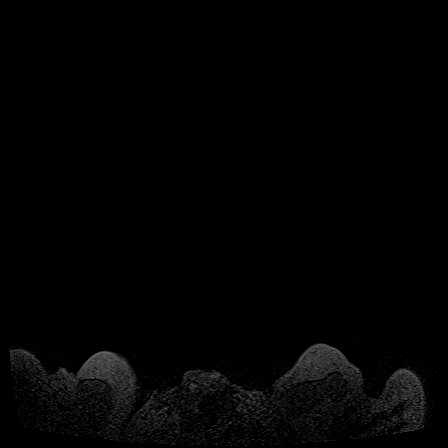

[Series 4: axial pre fs · axial · non-contrast · 1.0mm · 0.80mm/px · z∈[-108,+99]mm · 5 of 208 slices shown]
[im 1/208]
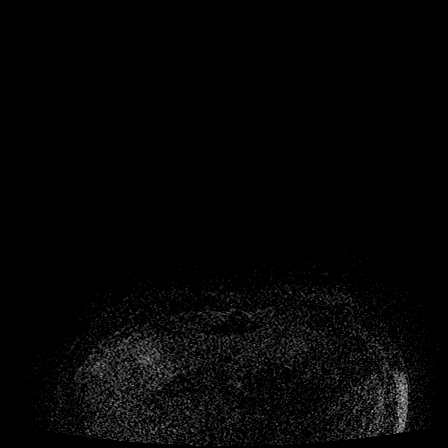
[im 52/208]
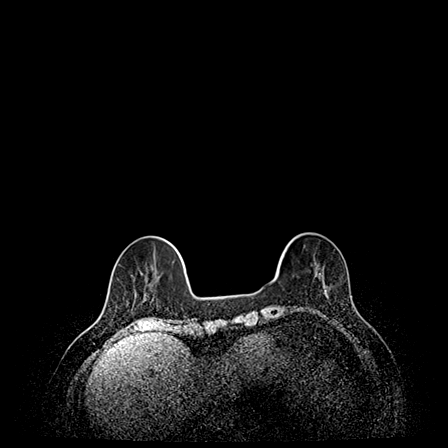
[im 104/208]
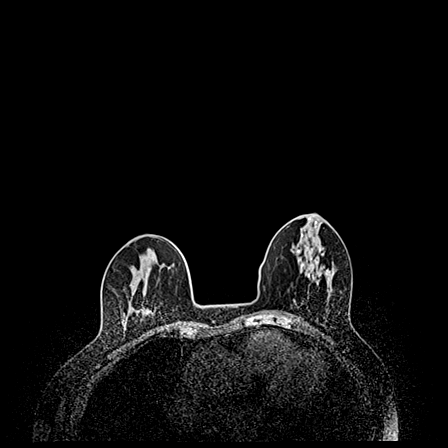
[im 156/208]
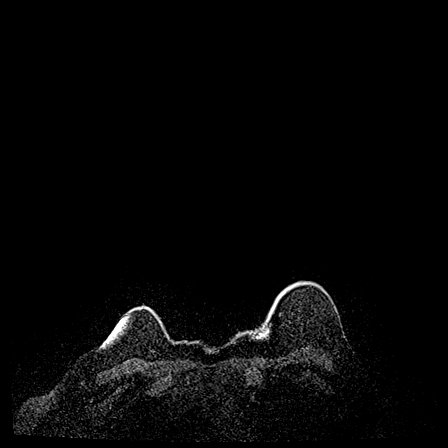
[im 208/208]
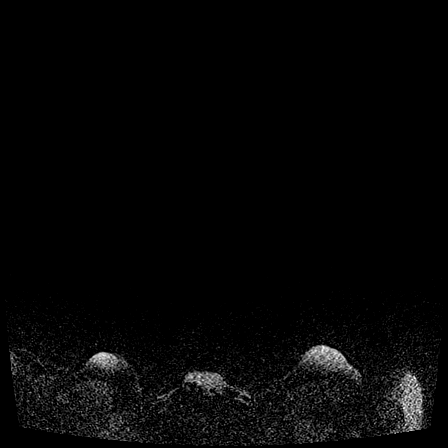

[Series 5: axial post 20 · axial · 1.0mm · 0.80mm/px · z∈[-108,+99]mm · 5 of 208 slices shown (1 of 3)]
[im 1/208]
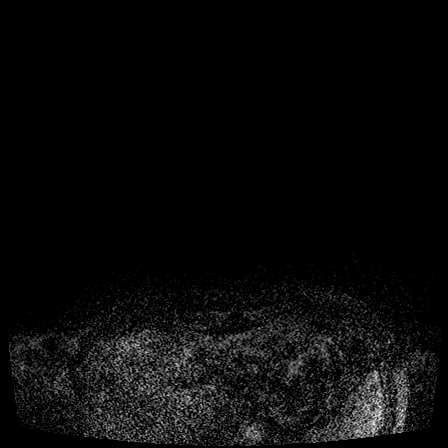
[im 52/208]
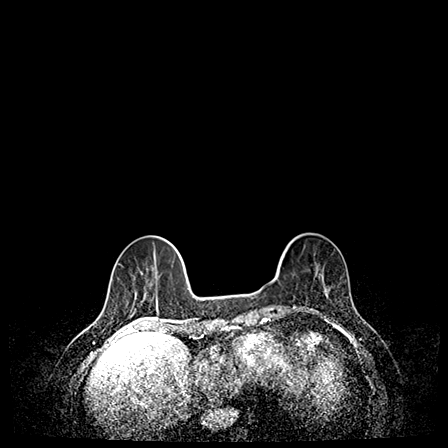
[im 104/208]
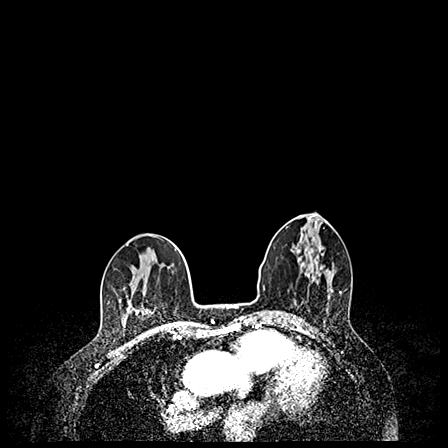
[im 156/208]
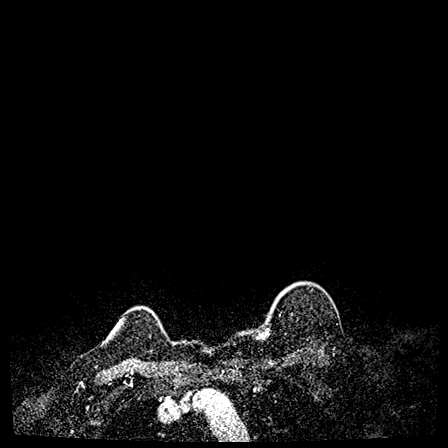
[im 208/208]
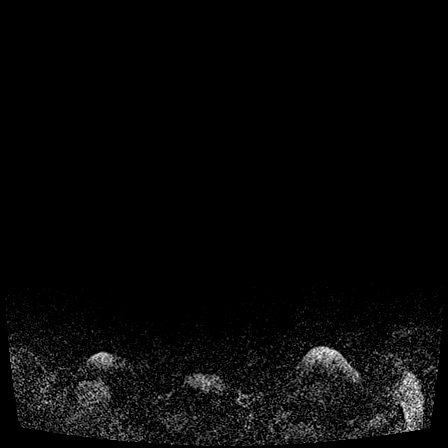

[Series 6: axial post 20 · axial · 1.0mm · 0.80mm/px · z∈[-108,+99]mm · 5 of 208 slices shown (2 of 3)]
[im 1/208]
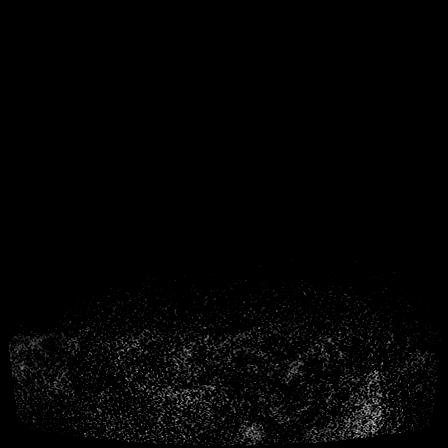
[im 52/208]
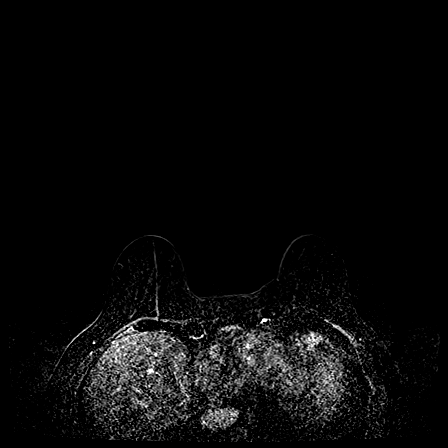
[im 104/208]
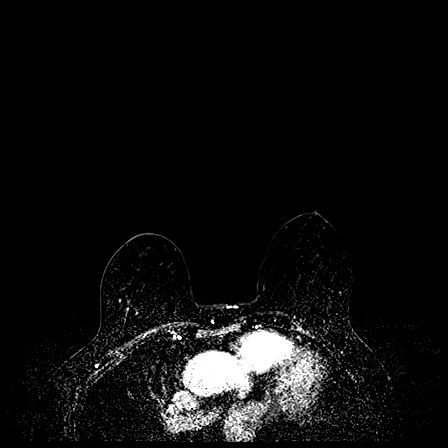
[im 156/208]
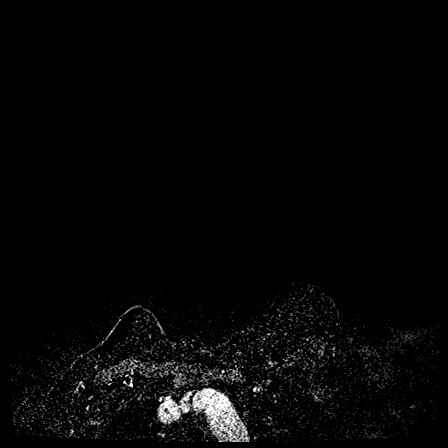
[im 208/208]
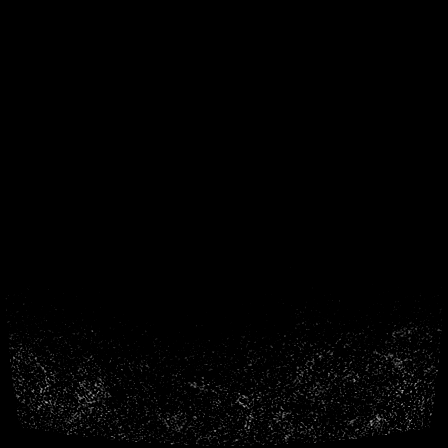

[Series 7: axial post 20 · axial · 208.0mm · 0.80mm/px · 1 of 1 slices shown (3 of 3)]
[im 1/1]
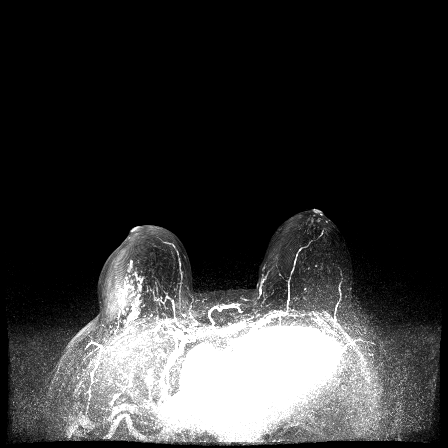

[Series 8: axial post 3 · axial · 1.0mm · 0.80mm/px · z∈[-108,-26]mm · 3 of 208 slices shown]
[im 1/208]
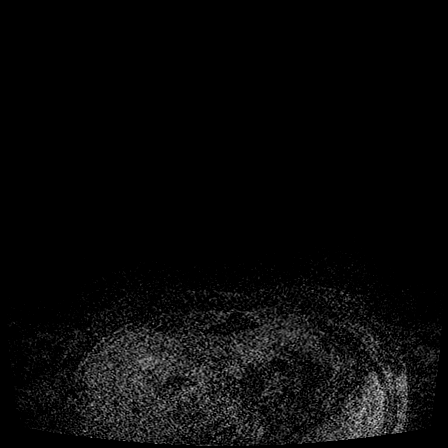
[im 42/208]
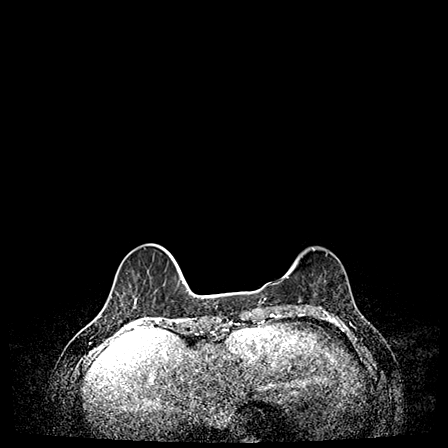
[im 83/208]
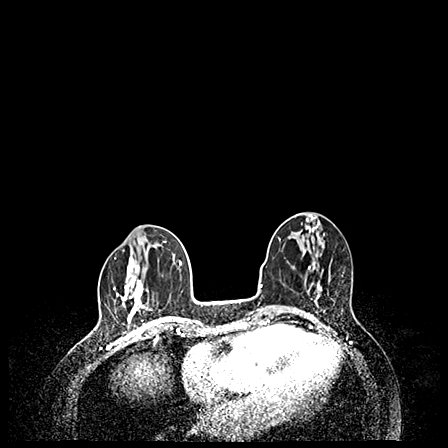

[25 of 48 positions shown; findings below may reference images not displayed]

THREE-DIMENSIONAL MR IMAGE RENDERING ON INDEPENDENT WORKSTATION:

Three-dimensional MR images were rendered by post-processing of the
original MR data on an independent workstation. The
three-dimensional MR images were interpreted, and findings are
reported in the following complete MRI report for this study. Three
dimensional images were evaluated at the independent [employer]
workstation
FINDINGS: Breast composition: c. Heterogeneous fibroglandular tissue.

Background parenchymal enhancement: Minimal.

Right breast: The non mass enhancement involving the majority of the
upper-outer and lower-outer quadrants of the right breast has not
significantly changed. A measure the greatest anterior to posterior
span of abnormal enhancement to be 5.6 cm, previously measured at
5.9 cm.

Left breast: Susceptibility artifact from the biopsy marking clip in
the slightly lower outer quadrant of the left breast demonstrates
stable distortion, however there is no significant associated
enhancement. The 4 mm mass previously seen at this site now is only
1 mm in size. No new left breast abnormalities are identified.

Lymph nodes: No abnormal appearing lymph nodes.

Ancillary findings:  None.
IMPRESSION: 1. The known lobular carcinoma of the right breast has not
significantly changed since the prior MRI from 05/02/2017. The
disease spans approximately 5.6 cm, previously 5.9 cm in the
anterior to posterior dimension.

2. Slight decrease in size of the biopsy proven invasive ductal
carcinoma in the left breast, previously measuring 4 mm, and
measuring 1 mm on today's exam.

3.  No new suspicious findings in either breast.

RECOMMENDATION:
Continue treatment plan for invasive lobular cancer of the right
breast and invasive ductal carcinoma in the left breast.

BI-RADS CATEGORY  6: Known biopsy-proven malignancy.

## 2018-12-11 ENCOUNTER — Other Ambulatory Visit: Payer: Medicare Other

## 2018-12-11 ENCOUNTER — Ambulatory Visit: Payer: Medicare Other

## 2018-12-23 NOTE — Progress Notes (Signed)
Madison Stokes   [phone] [phone]   Clinic Follow up Note   Patient Care Team: [name], MD as PCP - General (Family Medicine) [name], MD as Consulting Physician (Hematology) [name], NP as Nurse Practitioner (Nurse Practitioner) [name], MD as Consulting Physician (General Surgery) [name], NP as Nurse Practitioner (Hematology and Oncology)  Date of Service:  12/25/2018  CHIEF COMPLAINT: F/u of bilateral breast cancer  SUMMARY OF ONCOLOGIC HISTORY: Oncology History Overview Note  Cancer Staging Cancer of central portion of left breast [facility]) Staging form: Breast, AJCC 8th Edition - Clinical stage from 05/03/2017: Stage IA (cT1a, cN0, cM0, G1, ER: Positive, PR: Positive, HER2: Negative) - Signed by [name], MD on 05/07/2017 - Pathologic stage from 08/22/2017: No Stage Recommended (ypT0, pN0, cM0, GX, ER: Unknown, PR: Unknown, HER2: Unknown) - Signed by [name], MD on 11/06/2017  Cancer of overlapping sites of right female breast [facility]) Staging form: Breast, AJCC 8th Edition - Clinical stage from 04/24/2017: Stage IIA (cT3, cN0, cM0, G2, ER+, PR+, HER2-) - Signed by [name], MD on 11/06/2017 - Pathologic stage from 08/22/2017: No Stage Recommended (ypT3, pN0(i+), cM0, G2, ER+, PR+, HER2-) - Signed by [name], MD on 10/29/2017     Cancer of overlapping sites of right female breast ([place])  03/08/2017 Mammogram   IMPRESSION: 1. Patient has a is palpable mass in the lateral right breast, in the location dense fibroglandular breast tissue. Although no discrete mass is seen sonographically or mammographically, the Clinical change remains concerning. Biopsy is recommended.  RECOMMENDATION: 1. Ultrasound-guided core needle biopsy of palpable abnormality in the lateral right breast.   04/24/2017 Breast US   IMPRESSION: Ultrasound guided biopsy of the right breast. No apparent complications.   04/24/2017  Initial Biopsy   Breast, right, needle core biopsy, UOQ, centered at 9:30 o'clock - INVASIVE MAMMARY CARCINOMA. - SEE COMMENT. ER 100% positive PR 100% positive Ki67 10% HER2 FISH positive (ratio 2.10, copy number 3.30)  HER2 IHC was done on the biopsy sample after surgery, and was negative (1+). So the final HER2 is NEGATIVE (determined after surgery)   05/02/2017 Breast MRI   IMPRESSION: 1. 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 2. 4 mm enhancing mass with associated architectural distortion in the central left breast. This is suspicious for additional focus of carcinoma.   05/07/2017 Initial Diagnosis   Cancer of overlapping sites of right female breast ([place])   05/15/2017 Echocardiogram   ECHO 05/15/17  Impressions: - LVEF 60-65%, normal wall thickness, normal wall motion and GLS   strain, grade 1 DD, normal LV filling pressure, mild MR, normal   LA size, normal IVC.      05/22/2017 Imaging   CT CAP W Contrast 05/22/17 IMPRESSION: 1. No specific CT findings of nodal or other metastatic disease in the chest, abdomen, and pelvis. 2. Bosniak category 72F cyst in the right mid kidney, with mild enhancement along a septation but without nodularity. Unless follow up abdominal scans related to the patient's breast cancer adequately characterize this lesion, renal protocol MRI or CT scan would be recommended in 6 months time. 3. Other imaging findings of potential clinical significance: The Aortic Atherosclerosis (ICD10-I70.0). Ectatic ascending thoracic aorta without aneurysm. Small type 1 hiatal hernia. Several small hypodense liver lesions are likely benign although technically too small to characterize. Nonobstructive 1.2 cm right renal calculus. Anterior uterine  fundal myometrial mass favoring fibroid. Pelvic floor laxity with small cystocele. Notable spondylosis at L3-4 and L5-S1.     05/22/2017 Imaging   Bone Scan Whole Body 05/22/17 IMPRESSION: 1. No findings of osseous metastatic disease. 2. Lower lumbar activity corresponds to areas of significant benign spondylosis on the CT scan.   05/24/2017 - 09/05/2017 Chemotherapy   TCHP every 3 weeks for 6 cycles starting 05/24/17 followed by a year of maintenance Herceptin and Perjeta. Given lack of response she stopped TCHP after 4 cycles on 07/26/17.  She will proceeded with Maintenance Herceptin and Perjeta on 08/16/17 and 09/05/17 only due to hesurgical smaple being HER2 negative     07/12/2017 - 07/14/2017 Hospital Admission   Admit date: 07/12/17 Admission diagnosis: GI Hemorrhaging  Additional comments: Colonoscopy and EGD done with no evidence of bleeding site. Given blood transfusion on 07/15/17   08/02/2017 Imaging   MRI Breast Bilateral 2/16//18 IMPRESSION: 1. The known lobular carcinoma of the right breast has not significantly changed since the prior MRI from 05/02/2017. The disease spans approximately 5.6 cm, previously 5.9 cm in the anterior to posterior dimension. 2. 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 3.  No new suspicious findings in either breast. 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   08/16/2017 -  Anti-estrogen oral therapy   Letrozole once daily starting 08/16/17    08/22/2017 Surgery    RIGHT MASTECTOMY WITH SENTINEL LYMPH NODE BIOPSY ERAS PATHWAY by Dr. Georgette Dover   08/22/2017 Pathology Results   See the oncology history under left breast cancer   09/25/2017 - 11/05/2017 Radiation Therapy   She has completed bilateral adjuvant radiation 09/25/17-11/05/17 managed by Dr. Isidore Moos. She tolerated well.   12/17/2017 Imaging   CT AP W Contrast 12/17/17 IMPRESSION: 1. Stable bilateral renal cysts.  No change since prior examination and no worrisome CT imaging features. Stable lower pole right renal cyst. 2. No acute abdominal/pelvic findings or lymphadenopathy. 3. Stable small hepatic cysts. 4. Stable uterine fibroids.   01/22/2018 PET scan   PET 01/22/18 IMPRESSION: 1. Status post right mastectomy and left breast lumpectomy. 2. Minimal left axillary scarring changes near a surgical clip. No findings suspicious for chest wall tumor or axillary or supraclavicular adenopathy. 3. 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  The Hereditary Gene Panel offered by [facility] includes sequencing and/or deletion duplication testing of the following 47 genes: APC, ATM, AXIN2, BARD1, BMPR1A, BRCA1, BRCA2, BRIP1, CDH1, CDK4, CDKN2A (p14ARF), CDKN2A (p16INK4a), CHEK2, CTNNA1, DICER1, EPCAM (Deletion/duplication testing only), GREM1 (promoter region deletion/duplication testing only), KIT, MEN1, MLH1, MSH2, MSH3, MSH6, MUTYH, NBN, NF1, NHTL1, PALB2, PDGFRA, PMS2, POLD1, POLE, PTEN, RAD50, RAD51C, RAD51D, SDHB, SDHC, SDHD, SMAD4, SMARCA4. STK11, TP53, TSC1, TSC2, and VHL.  The following genes were evaluated for sequence changes only: SDHA and HOXB13 c.251G>A variant only. The report date is February 15, 2018.   Cancer of central portion of left breast ([place])  05/02/2017 Breast MRI   IMPRESSION: 1. 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 2. 4 mm enhancing mass with associated architectural distortion in the central left breast. This is suspicious for  additional focus of carcinoma.   05/03/2017 Mammogram   IMPRESSION: 1. Suspicious area of architectural distortion in the left breast which corresponds to a small enhancing mass on MRI.  RECOMMENDATION: Stereotactic  core needle biopsy of the area of architectural distortion in the left breast.    05/03/2017 Breast US   Stereotactic core needle biopsy of the area of architectural distortion in the left breast.   05/03/2017 Initial Biopsy   Breast, left, needle core biopsy, central, slightly toward the lower, outer quadrant - INVASIVE DUCTAL CARCINOMA, MSBR GRADE 1/2. - SEE MICROSCOPIC DESCRIPTION. ER 50% positive PR 90% positive Ki67: 1% HER2 negative    05/07/2017 Initial Diagnosis   Cancer of central portion of left breast ([place])   08/22/2017 Surgery   LEFT BREAST LUMPECTOMY WITH RADIOACTIVE SEED AND SENTINEL LYMPH NODE BIOPSY ERAS PATHWAY by Dr. Georgette Dover 08/22/17    08/22/2017 Pathology Results   Diagnosis 08/22/17 1. Breast, lumpectomy, Left - ATYPICAL DUCTAL HYPERPLASIA - FIBROCYSTIC CHANGES - PREVIOUS BIOPSY SITE CHANGES - SEE COMMENT 2. Lymph node, sentinel, biopsy, a total of 4 nodes, all negative for malignant cells  6. Breast, simple mastectomy, Right - INVASIVE LOBULAR CARCINOMA, [place] GRADE 2/3, 6.0 CM - LOBULAR NEOPLASIA (ATYPICAL LOBULAR HYPERPLASIA) - PREVIOUS BIOPSY SITE CHANGES 7. 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  The Hereditary Gene Panel offered by [facility] includes sequencing and/or deletion duplication testing of the following 47 genes: APC, ATM, AXIN2, BARD1, BMPR1A, BRCA1, BRCA2, BRIP1, CDH1, CDK4, CDKN2A (p14ARF), CDKN2A (p16INK4a), CHEK2, CTNNA1, DICER1, EPCAM (Deletion/duplication testing only), GREM1 (promoter region deletion/duplication testing only), KIT, MEN1, MLH1, MSH2, MSH3, MSH6, MUTYH, NBN, NF1, NHTL1, PALB2, PDGFRA, PMS2, POLD1, POLE, PTEN, RAD50, RAD51C, RAD51D, SDHB, SDHC, SDHD, SMAD4, SMARCA4. STK11, TP53, TSC1, TSC2, and VHL.  The following genes were evaluated for sequence  changes only: SDHA and HOXB13 c.251G>A variant only. The report date is February 15, 2018.      CURRENT THERAPY:  -adjuvant Letrozole once daily started 08/16/17 -Zometa injections every 6 months for 2 years starting 03/21/18  INTERVAL HISTORY:  Madison Stokes is here for a follow up and injection. She presents to the clinic alone. She notes she is doing well. She notes she has increased energy levels and will go out in her yard. She notes she changed her diet and reduced her sugar intake due to recent hyperglycemia. She has lost a few pounds from this.  She denies any GI issues. Her appetite is adequate. She notes her BP medication was increased 2 months ago by her PCP but her BP is still elevated.  She has been tolerating Zometa and Letrozole well with no issues.    REVIEW OF SYSTEMS:   Constitutional: Denies fevers, chills (+) purposeful weight loss Eyes: Denies blurriness of vision Ears, nose, mouth, throat, and face: Denies mucositis or sore throat Respiratory: Denies cough, dyspnea or wheezes Cardiovascular: Denies palpitation, chest discomfort or lower extremity swelling Gastrointestinal:  Denies nausea, heartburn or change in bowel habits Skin: Denies abnormal skin rashes Lymphatics: Denies new lymphadenopathy or easy bruising Neurological:Denies numbness, tingling or new weaknesses Behavioral/Psych: Mood is stable, no new changes  All other systems were reviewed with the patient and are negative.  MEDICAL HISTORY:  Past Medical History:  Diagnosis Date  . Anemia    with chemo  .  Cancer ([place]) 04/2017   Bil Breast Ca  . Family history of prostate cancer   . Family history of uterine cancer   . History of radiation therapy 09/25/17- 11/05/17   Left Breast/ 50 Gy in 25 fractions, right breast/ 50 Gy in 25 fractions, right SCLV/ 45 Gy in 25 fractions, right breast boost/ 10 Gy in 5 fractions.   . Hyperlipidemia   . Hypertension   . Personal history of chemotherapy   .  Personal history of radiation therapy   . PONV (postoperative nausea and vomiting)    nausea only    SURGICAL HISTORY: Past Surgical History:  Procedure Laterality Date  . BREAST BIOPSY    . BREAST LUMPECTOMY Left    2019  . BREAST LUMPECTOMY WITH RADIOACTIVE SEED AND SENTINEL LYMPH NODE BIOPSY Left 08/22/2017   Procedure: LEFT BREAST LUMPECTOMY WITH RADIOACTIVE SEED AND SENTINEL LYMPH NODE BIOPSY ERAS PATHWAY;  Surgeon: [name], MD;  Location: [facility];  Service: General;  Laterality: Left;  PECTORAL BLOCK  . COLONOSCOPY WITH PROPOFOL Left 07/14/2017   Procedure: COLONOSCOPY WITH PROPOFOL;  Surgeon: Ronnette Juniper, MD;  Location: [facility];  Service: Gastroenterology;  Laterality: Left;  . DILATION AND CURETTAGE OF UTERUS    . ESOPHAGOGASTRODUODENOSCOPY N/A 07/13/2017   Procedure: ESOPHAGOGASTRODUODENOSCOPY (EGD);  Surgeon: Ronnette Juniper, MD;  Location: [facility];  Service: Gastroenterology;  Laterality: N/A;  . MASTECTOMY Right    2019  . MASTECTOMY W/ SENTINEL NODE BIOPSY Right 08/22/2017   Procedure: RIGHT MASTECTOMY WITH SENTINEL LYMPH NODE BIOPSY ERAS PATHWAY;  Surgeon: [name], MD;  Location: [facility];  Service: General;  Laterality: Right;  PECTORAL BLOCK  . PORT-A-CATH REMOVAL Right 11/06/2017   Procedure: REMOVAL PORT-A-CATH;  Surgeon: [name], MD;  Location: [facility];  Service: General;  Laterality: Right;  . PORTACATH PLACEMENT Right 05/14/2017   Procedure: ULTRASOUND GUIDED PORT PLACEMENT;  Surgeon: [name], MD;  Location: [facility];  Service: General;  Laterality: Right;    I have reviewed the social history and family history with the patient and they are unchanged from previous note.  ALLERGIES:  has No Known Allergies.  MEDICATIONS:  Current Outpatient Medications  Medication Sig Dispense Refill  . aspirin EC 81 MG tablet Take 81 mg by mouth daily.    . Biotin 10 MG CAPS Take by mouth.    . Calcium  Citrate-Vitamin D 500-500 MG-UNIT PACK Take 2 tablets by mouth daily.    Marland Kitchen HYDROcodone-acetaminophen (NORCO/VICODIN) 5-325 MG tablet Take 1 tablet by mouth every 6 (six) hours as needed for moderate pain. 12 tablet 0  . irbesartan (AVAPRO) 150 MG tablet Take 300 mg by mouth daily.     Marland Kitchen letrozole (FEMARA) 2.5 MG tablet Take 1 tablet (2.5 mg total) by mouth daily. 90 tablet 3  . loperamide (IMODIUM) 2 MG capsule Take 2-4 mg by mouth 4 (four) times daily as needed for diarrhea or loose stools.     Marland Kitchen loratadine (CLARITIN) 10 MG tablet Take 10 mg by mouth daily as needed for allergies.    . Multiple Vitamin (MULTIVITAMIN) tablet Take 1 tablet daily by mouth.    . simvastatin (ZOCOR) 40 MG tablet Take 40 mg at bedtime by mouth.    Marland Kitchen VITAMIN B COMPLEX-C PO Take by mouth.     No current facility-administered medications for this visit.     PHYSICAL EXAMINATION: ECOG PERFORMANCE STATUS: 0 - Asymptomatic  Vitals:   12/25/18 1005  BP: Marland Kitchen)  154/90  Pulse: 75  Resp: 18  Temp: 98.9 F (37.2 C)  SpO2: 98%   Filed Weights   12/25/18 1005  Weight: 123 lb 11.2 oz (56.1 kg)    GENERAL:alert, no distress and comfortable SKIN: skin color, texture, turgor are normal, no rashes or significant lesions EYES: normal, Conjunctiva are pink and non-injected, sclera clear  NECK: supple, thyroid normal size, non-tender, without nodularity LYMPH:  no palpable lymphadenopathy in the cervical, axillary  LUNGS: clear to auscultation and percussion with normal breathing effort HEART: regular rate & rhythm and no murmurs and no lower extremity edema ABDOMEN:abdomen soft, non-tender and normal bowel sounds Musculoskeletal:no cyanosis of digits and no clubbing  NEURO: alert & oriented x 3 with fluent speech, no focal motor/sensory deficits BREAST: (+)s/p right mastectomy, left breast lumpectomy and B/l adjuvant Radiation: Surgical incisions healed well  (+) No palpable mass or adenopathy.    LABORATORY DATA:  I  have reviewed the data as listed CBC Latest Ref Rng & Units 12/25/2018 06/23/2018 03/21/2018  WBC 4.0 - 10.5 K/uL 3.9(L) 3.8(L) 3.0(L)  Hemoglobin 12.0 - 15.0 g/dL 14.5 14.5 13.9  Hematocrit 36.0 - 46.0 % 43.6 43.6 41.9  Platelets 150 - 400 K/uL 189 195 190     CMP Latest Ref Rng & Units 12/25/2018 06/23/2018 03/21/2018  Glucose 70 - 99 mg/dL 106(H) 97 99  BUN 8 - 23 mg/dL '21 19 22  ' Creatinine 0.44 - 1.00 mg/dL 0.86 0.84 0.86  Sodium 135 - 145 mmol/L 143 144 145  Potassium 3.5 - 5.1 mmol/L 4.0 4.4 4.4  Chloride 98 - 111 mmol/L 109 108 109  CO2 22 - 32 mmol/L '24 26 27  ' Calcium 8.9 - 10.3 mg/dL 9.6 9.9 9.9  Total Protein 6.5 - 8.1 g/dL 7.3 6.9 7.1  Total Bilirubin 0.3 - 1.2 mg/dL 0.5 0.4 0.3  Alkaline Phos 38 - 126 U/L 87 76 95  AST 15 - 41 U/L 30 32 30  ALT 0 - 44 U/L 28 40 27      RADIOGRAPHIC STUDIES: I have personally reviewed the radiological images as listed and agreed with the findings in the report. No results found.   ASSESSMENT & PLAN:  Madison Stokes is a 73 y.o. female with   1. 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 She underwent neoadjuvant TCHP and maintenance Herceptin/Perjeta, s/p right mastectomy, left breast lumpectomy and B/l adjuvant Radiation. -She started anti-estrogen therapy with Letrozole in 07/2017. She is tolerating well with occasional hot flashes, improving.  -She attended Survivorship Clinic with NP Lolita Cram 02/05/18. -she was previously screened for the NATALEE clinical trial but did not qualify.  -She is clinically doing well. Lab reviewed, her CBC and CMP are  within normal limits except WBC 3.9. Ca 27.29 still pending. CA27.29 has been elevated since surgery, probably non-specific.Marland KitchenPET in 2019 was negative. Her physical exam and her 04/2018 mammogram were unremarkable. There is no clinical concern for recurrence. -Continue surveillance, next L mammogram in 04/2019.  -Continue Letrozole  -f/u every 6 months. I encouraged her to contact clinic for any concerning symptoms of breast cancer recurrence.   2. Genetics  -  Her8/14/19results showed positive forRAD51C c.1043A>T (p.Asp348Val) VUS identified on the common hereditary cancer panel.Otherwise her results were negative.  3. Bosniak category 19F cyst in the right mid kidney, 1.2cm Non-obstructive kidney stone -found on 05/22/17 CT Scan  -Her 12/17/17 CT AP showed stable renal and hepatic cysts. Will continue to monitor.  4.Osteopenia -Her 01/2018 DEXA showed osteopenia with the lowest T-Score at AP Spine at -2. -She is onZometa injectionssince9/27/19and continue every 6 months for 2 years -Continue Calcium and VitD supplement daily. -Will proceed with Zometa injection today, next in 06/2019  5. HTN, recent Hyperglycemia  -She recent seen by her PCP 2 months ago. She was found to be have elevated BG. He also increased her irbesartan dose.  -She has reduced sugar in her diet and able to lose a few pounds.  -Her BP at 154/90 today (12/25/18). I encouraged her to monitor at home.  -I discussed letrozole can slow down her metabolism. I encouraged her to continue healthy balanced diet, exercise and following with PCP.   PLAN: -She is clinically doing well.  -Continue Letrozole  -Lab and f/u and Zometa infusion in 6 months    No problem-specific Assessment & Plan notes found for this encounter.   No orders of the defined types were placed in this encounter.  All questions were answered. The patient knows to call the clinic with any problems, questions or concerns. No barriers to  learning was detected. I spent 15 minutes counseling the patient face to face. The total time spent in the appointment was 20 minutes and more than 50% was on counseling and review of test results     [name], MD 12/25/2018   I, Joslyn Devon, am acting as scribe for [name], MD.   I have reviewed the above documentation for accuracy and completeness, and I agree with the above.

## 2018-12-25 ENCOUNTER — Inpatient Hospital Stay: Payer: Medicare Other | Attending: Hematology | Admitting: Hematology

## 2018-12-25 ENCOUNTER — Inpatient Hospital Stay: Payer: Medicare Other

## 2018-12-25 ENCOUNTER — Other Ambulatory Visit: Payer: Self-pay

## 2018-12-25 ENCOUNTER — Telehealth: Payer: Self-pay | Admitting: Hematology

## 2018-12-25 VITALS — BP 154/90 | HR 75 | Temp 98.9°F | Resp 18 | Ht 62.0 in | Wt 123.7 lb

## 2018-12-25 DIAGNOSIS — R739 Hyperglycemia, unspecified: Secondary | ICD-10-CM | POA: Insufficient documentation

## 2018-12-25 DIAGNOSIS — Z170 Estrogen receptor positive status [ER+]: Secondary | ICD-10-CM | POA: Diagnosis not present

## 2018-12-25 DIAGNOSIS — C50112 Malignant neoplasm of central portion of left female breast: Secondary | ICD-10-CM | POA: Insufficient documentation

## 2018-12-25 DIAGNOSIS — C50811 Malignant neoplasm of overlapping sites of right female breast: Secondary | ICD-10-CM | POA: Diagnosis not present

## 2018-12-25 DIAGNOSIS — M8580 Other specified disorders of bone density and structure, unspecified site: Secondary | ICD-10-CM | POA: Diagnosis not present

## 2018-12-25 DIAGNOSIS — I10 Essential (primary) hypertension: Secondary | ICD-10-CM | POA: Insufficient documentation

## 2018-12-25 LAB — CBC WITH DIFFERENTIAL (CANCER CENTER ONLY)
Abs Immature Granulocytes: 0.01 10*3/uL (ref 0.00–0.07)
Basophils Absolute: 0.1 10*3/uL (ref 0.0–0.1)
Basophils Relative: 2 %
Eosinophils Absolute: 0.1 10*3/uL (ref 0.0–0.5)
Eosinophils Relative: 3 %
HCT: 43.6 % (ref 36.0–46.0)
Hemoglobin: 14.5 g/dL (ref 12.0–15.0)
Immature Granulocytes: 0 %
Lymphocytes Relative: 26 %
Lymphs Abs: 1 10*3/uL (ref 0.7–4.0)
MCH: 31.5 pg (ref 26.0–34.0)
MCHC: 33.3 g/dL (ref 30.0–36.0)
MCV: 94.6 fL (ref 80.0–100.0)
Monocytes Absolute: 0.6 10*3/uL (ref 0.1–1.0)
Monocytes Relative: 15 %
Neutro Abs: 2.1 10*3/uL (ref 1.7–7.7)
Neutrophils Relative %: 54 %
Platelet Count: 189 10*3/uL (ref 150–400)
RBC: 4.61 MIL/uL (ref 3.87–5.11)
RDW: 13.4 % (ref 11.5–15.5)
WBC Count: 3.9 10*3/uL — ABNORMAL LOW (ref 4.0–10.5)
nRBC: 0 % (ref 0.0–0.2)

## 2018-12-25 LAB — CMP (CANCER CENTER ONLY)
ALT: 28 U/L (ref 0–44)
AST: 30 U/L (ref 15–41)
Albumin: 4.2 g/dL (ref 3.5–5.0)
Alkaline Phosphatase: 87 U/L (ref 38–126)
Anion gap: 10 (ref 5–15)
BUN: 21 mg/dL (ref 8–23)
CO2: 24 mmol/L (ref 22–32)
Calcium: 9.6 mg/dL (ref 8.9–10.3)
Chloride: 109 mmol/L (ref 98–111)
Creatinine: 0.86 mg/dL (ref 0.44–1.00)
GFR, Est AFR Am: >60 mL/min (ref >60)
GFR, Estimated: >60 mL/min (ref >60)
Glucose, Bld: 106 mg/dL — ABNORMAL HIGH (ref 70–99)
Potassium: 4 mmol/L (ref 3.5–5.1)
Sodium: 143 mmol/L (ref 135–145)
Total Bilirubin: 0.5 mg/dL (ref 0.3–1.2)
Total Protein: 7.3 g/dL (ref 6.5–8.1)

## 2018-12-25 MED ORDER — ZOLEDRONIC ACID 4 MG/100ML IV SOLN
4.0000 mg | Freq: Once | INTRAVENOUS | Status: AC
  Administered 2018-12-25: 4 mg via INTRAVENOUS
  Filled 2018-12-25: qty 100

## 2018-12-25 MED ORDER — SODIUM CHLORIDE 0.9 % IV SOLN
Freq: Once | INTRAVENOUS | Status: AC
  Administered 2018-12-25: 11:00:00 via INTRAVENOUS
  Filled 2018-12-25: qty 250

## 2018-12-25 NOTE — Telephone Encounter (Signed)
Scheduled appt per 7/2 los. °

## 2018-12-25 NOTE — Patient Instructions (Signed)
Zoledronic Acid injection (Hypercalcemia, Oncology) What is this medicine? ZOLEDRONIC ACID (ZOE le dron ik AS id) lowers the amount of calcium loss from bone. It is used to treat too much calcium in your blood from cancer. It is also used to prevent complications of cancer that has spread to the bone. This medicine may be used for other purposes; ask your health care provider or pharmacist if you have questions. COMMON BRAND NAME(S): Zometa What should I tell my health care provider before I take this medicine? They need to know if you have any of these conditions:  aspirin-sensitive asthma  cancer, especially if you are receiving medicines used to treat cancer  dental disease or wear dentures  infection  kidney disease  receiving corticosteroids like dexamethasone or prednisone  an unusual or allergic reaction to zoledronic acid, other medicines, foods, dyes, or preservatives  pregnant or trying to get pregnant  breast-feeding How should I use this medicine? This medicine is for infusion into a vein. It is given by a health care professional in a hospital or clinic setting. Talk to your pediatrician regarding the use of this medicine in children. Special care may be needed. Overdosage: If you think you have taken too much of this medicine contact a poison control center or emergency room at once. NOTE: This medicine is only for you. Do not share this medicine with others. What if I miss a dose? It is important not to miss your dose. Call your doctor or health care professional if you are unable to keep an appointment. What may interact with this medicine?  certain antibiotics given by injection  NSAIDs, medicines for pain and inflammation, like ibuprofen or naproxen  some diuretics like bumetanide, furosemide  teriparatide  thalidomide This list may not describe all possible interactions. Give your health care provider a list of all the medicines, herbs, non-prescription  drugs, or dietary supplements you use. Also tell them if you smoke, drink alcohol, or use illegal drugs. Some items may interact with your medicine. What should I watch for while using this medicine? Visit your doctor or health care professional for regular checkups. It may be some time before you see the benefit from this medicine. Do not stop taking your medicine unless your doctor tells you to. Your doctor may order blood tests or other tests to see how you are doing. Women should inform their doctor if they wish to become pregnant or think they might be pregnant. There is a potential for serious side effects to an unborn child. Talk to your health care professional or pharmacist for more information. You should make sure that you get enough calcium and vitamin D while you are taking this medicine. Discuss the foods you eat and the vitamins you take with your health care professional. Some people who take this medicine have severe bone, joint, and/or muscle pain. This medicine may also increase your risk for jaw problems or a broken thigh bone. Tell your doctor right away if you have severe pain in your jaw, bones, joints, or muscles. Tell your doctor if you have any pain that does not go away or that gets worse. Tell your dentist and dental surgeon that you are taking this medicine. You should not have major dental surgery while on this medicine. See your dentist to have a dental exam and fix any dental problems before starting this medicine. Take good care of your teeth while on this medicine. Make sure you see your dentist for regular follow-up   appointments. What side effects may I notice from receiving this medicine? Side effects that you should report to your doctor or health care professional as soon as possible:  allergic reactions like skin rash, itching or hives, swelling of the face, lips, or tongue  anxiety, confusion, or depression  breathing problems  changes in vision  eye  pain  feeling faint or lightheaded, falls  jaw pain, especially after dental work  mouth sores  muscle cramps, stiffness, or weakness  redness, blistering, peeling or loosening of the skin, including inside the mouth  trouble passing urine or change in the amount of urine Side effects that usually do not require medical attention (report to your doctor or health care professional if they continue or are bothersome):  bone, joint, or muscle pain  constipation  diarrhea  fever  hair loss  irritation at site where injected  loss of appetite  nausea, vomiting  stomach upset  trouble sleeping  trouble swallowing  weak or tired This list may not describe all possible side effects. Call your doctor for medical advice about side effects. You may report side effects to FDA at 1-800-FDA-1088. Where should I keep my medicine? This drug is given in a hospital or clinic and will not be stored at home. NOTE: This sheet is a summary. It may not cover all possible information. If you have questions about this medicine, talk to your doctor, pharmacist, or health care provider.  2020 Elsevier/Gold Standard (2013-11-07 14:19:39)  Coronavirus (COVID-19) Are you at risk?  Are you at risk for the Coronavirus (COVID-19)?  To be considered HIGH RISK for Coronavirus (COVID-19), you have to meet the following criteria:  . Traveled to [place], [place], [place], [place] or [place]; or in the [place] to [place], [place], [place], or [place]; and have fever, cough, and shortness of breath within the last 2 weeks of travel OR . Been in close contact with a person diagnosed with COVID-19 within the last 2 weeks and have fever, cough, and shortness of breath . IF YOU DO NOT MEET THESE CRITERIA, YOU ARE CONSIDERED LOW RISK FOR COVID-19.  What to do if you are HIGH RISK for COVID-19?  . If you are having a medical emergency, call 911. . Seek medical care right away. Before you go  to a doctor's office, urgent care or emergency department, call ahead and tell them about your recent travel, contact with someone diagnosed with COVID-19, and your symptoms. You should receive instructions from your physician's office regarding next steps of care.  . When you arrive at healthcare provider, tell the healthcare staff immediately you have returned from visiting [place], [place], [place], [place] or [place]; or traveled in the [place] to [place], [place], [place], or [place]; in the last two weeks or you have been in close contact with a person diagnosed with COVID-19 in the last 2 weeks.   . Tell the health care staff about your symptoms: fever, cough and shortness of breath. . After you have been seen by a medical provider, you will be either: o Tested for (COVID-19) and discharged home on quarantine except to seek medical care if symptoms worsen, and asked to  - Stay home and avoid contact with others until you get your results (4-5 days)  - Avoid travel on public transportation if possible (such as bus, train, or airplane) or o Sent to the Emergency Department by EMS for evaluation, COVID-19 testing, and possible admission depending   on your condition and test results.  What to do if you are LOW RISK for COVID-19?  Reduce your risk of any infection by using the same precautions used for avoiding the common cold or flu:  . Wash your hands often with soap and warm water for at least 20 seconds.  If soap and water are not readily available, use an alcohol-based hand sanitizer with at least 60% alcohol.  . If coughing or sneezing, cover your mouth and nose by coughing or sneezing into the elbow areas of your shirt or coat, into a tissue or into your sleeve (not your hands). . Avoid shaking hands with others and consider head nods or verbal greetings only. . Avoid touching your eyes, nose, or mouth with unwashed hands.  . Avoid close contact with people who are sick. . Avoid  places or events with large numbers of people in one location, like concerts or sporting events. . Carefully consider travel plans you have or are making. . If you are planning any travel outside or inside the [place], visit the CDC's Travelers' Health webpage for the latest health notices. . If you have some symptoms but not all symptoms, continue to monitor at home and seek medical attention if your symptoms worsen. . If you are having a medical emergency, call 911.   ADDITIONAL HEALTHCARE OPTIONS FOR PATIENTS  [facility] / e-Visit: [email or web address]         [facility]: [phone]  [facility]: [phone]                   [facility]: [phone]   

## 2018-12-26 ENCOUNTER — Encounter: Payer: Self-pay | Admitting: Hematology

## 2018-12-26 LAB — CANCER ANTIGEN 27.29: CA 27.29: 83.5 U/mL — ABNORMAL HIGH (ref 0.0–38.6)

## 2018-12-29 ENCOUNTER — Telehealth: Payer: Self-pay

## 2018-12-29 NOTE — Telephone Encounter (Signed)
Left voice message for patient regarding lab results.  CA27.29 result, is stable per Dr. Burr Medico.  Likely nonspecific, no concerns.  Encouraged her to call back if she has any questions.

## 2018-12-29 NOTE — Telephone Encounter (Signed)
-----   Message from [name], MD sent at 12/28/2018 10:24 AM EDT ----- Please let pt know her CA27.29 result, stable, likely nonspecific, no concerns, thanks   [name]  12/28/2018

## 2019-03-12 DIAGNOSIS — Z0000 Encounter for general adult medical examination without abnormal findings: Secondary | ICD-10-CM | POA: Diagnosis not present

## 2019-03-12 DIAGNOSIS — R7303 Prediabetes: Secondary | ICD-10-CM | POA: Diagnosis not present

## 2019-03-12 DIAGNOSIS — I10 Essential (primary) hypertension: Secondary | ICD-10-CM | POA: Diagnosis not present

## 2019-03-12 DIAGNOSIS — E7800 Pure hypercholesterolemia, unspecified: Secondary | ICD-10-CM | POA: Diagnosis not present

## 2019-03-17 ENCOUNTER — Other Ambulatory Visit: Payer: Self-pay | Admitting: Hematology

## 2019-03-17 DIAGNOSIS — Z853 Personal history of malignant neoplasm of breast: Secondary | ICD-10-CM

## 2019-03-27 DIAGNOSIS — Z23 Encounter for immunization: Secondary | ICD-10-CM | POA: Diagnosis not present

## 2019-03-27 DIAGNOSIS — I10 Essential (primary) hypertension: Secondary | ICD-10-CM | POA: Diagnosis not present

## 2019-03-27 DIAGNOSIS — R7303 Prediabetes: Secondary | ICD-10-CM | POA: Diagnosis not present

## 2019-03-27 DIAGNOSIS — E7800 Pure hypercholesterolemia, unspecified: Secondary | ICD-10-CM | POA: Diagnosis not present

## 2019-05-08 ENCOUNTER — Ambulatory Visit
Admission: RE | Admit: 2019-05-08 | Discharge: 2019-05-08 | Disposition: A | Discharge status: Home / Self Care | Payer: Medicare Other | Source: Ambulatory Visit | Attending: Hematology | Admitting: Hematology

## 2019-05-08 ENCOUNTER — Other Ambulatory Visit: Payer: Self-pay

## 2019-05-08 DIAGNOSIS — R928 Other abnormal and inconclusive findings on diagnostic imaging of breast: Secondary | ICD-10-CM | POA: Diagnosis not present

## 2019-05-08 DIAGNOSIS — Z853 Personal history of malignant neoplasm of breast: Secondary | ICD-10-CM

## 2019-06-24 NOTE — Progress Notes (Signed)
[place]   [phone] [phone]   Clinic Follow up Note   Patient Care Team: [name], MD as PCP - General (Family Medicine) [name], MD as Consulting Physician (Hematology) [name], NP as Nurse Practitioner (Nurse Practitioner) [name], MD as Consulting Physician (General Surgery) [name], NP as Nurse Practitioner (Hematology and Oncology)  Date of Service:  06/29/2019  CHIEF COMPLAINT: F/u of bilateral breast cancer  SUMMARY OF ONCOLOGIC HISTORY: Oncology History Overview Note  Cancer Staging Cancer of central portion of left breast [facility]) Staging form: Breast, AJCC 8th Edition - Clinical stage from 05/03/2017: Stage IA (cT1a, cN0, cM0, G1, ER: Positive, PR: Positive, HER2: Negative) - Signed by [name], MD on 05/07/2017 - Pathologic stage from 08/22/2017: No Stage Recommended (ypT0, pN0, cM0, GX, ER: Unknown, PR: Unknown, HER2: Unknown) - Signed by [name], MD on 11/06/2017  Cancer of overlapping sites of right female breast [facility]) Staging form: Breast, AJCC 8th Edition - Clinical stage from 04/24/2017: Stage IIA (cT3, cN0, cM0, G2, ER+, PR+, HER2-) - Signed by [name], MD on 11/06/2017 - Pathologic stage from 08/22/2017: No Stage Recommended (ypT3, pN0(i+), cM0, G2, ER+, PR+, HER2-) - Signed by [name], MD on 10/29/2017     Cancer of overlapping sites of right female breast ([place])  03/08/2017 Mammogram   IMPRESSION: 1. Patient has a is palpable mass in the lateral right breast, in the location dense fibroglandular breast tissue. Although no discrete mass is seen sonographically or mammographically, the Clinical change remains concerning. Biopsy is recommended.  RECOMMENDATION: 1. Ultrasound-guided core needle biopsy of palpable abnormality in the lateral right breast.   04/24/2017 Breast US   IMPRESSION: Ultrasound guided biopsy of the right breast. No apparent complications.   04/24/2017  Initial Biopsy   Breast, right, needle core biopsy, UOQ, centered at 9:30 o'clock - INVASIVE MAMMARY CARCINOMA. - SEE COMMENT. ER 100% positive PR 100% positive Ki67 10% HER2 FISH positive (ratio 2.10, copy number 3.30)  HER2 IHC was done on the biopsy sample after surgery, and was negative (1+). So the final HER2 is NEGATIVE (determined after surgery)   05/02/2017 Breast MRI   IMPRESSION: 1. 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 2. 4 mm enhancing mass with associated architectural distortion in the central left breast. This is suspicious for additional focus of carcinoma.   05/07/2017 Initial Diagnosis   Cancer of overlapping sites of right female breast ([place])   05/15/2017 Echocardiogram   ECHO 05/15/17  Impressions: - LVEF 60-65%, normal wall thickness, normal wall motion and GLS   strain, grade 1 DD, normal LV filling pressure, mild MR, normal   LA size, normal IVC.      05/22/2017 Imaging   CT CAP W Contrast 05/22/17 IMPRESSION: 1. No specific CT findings of nodal or other metastatic disease in the chest, abdomen, and pelvis. 2. Bosniak category 54F cyst in the right mid kidney, with mild enhancement along a septation but without nodularity. Unless follow up abdominal scans related to the patient's breast cancer adequately characterize this lesion, renal protocol MRI or CT scan would be recommended in 6 months time. 3. Other imaging findings of potential clinical significance: The Aortic Atherosclerosis (ICD10-I70.0). Ectatic ascending thoracic aorta without aneurysm. Small type 1 hiatal hernia. Several small hypodense liver lesions are likely benign although technically too small to characterize. Nonobstructive 1.2 cm right renal calculus. Anterior uterine  fundal myometrial mass favoring fibroid. Pelvic floor laxity with small cystocele. Notable spondylosis at L3-4 and L5-S1.      05/22/2017 Imaging   Bone Scan Whole Body 05/22/17 IMPRESSION: 1. No findings of osseous metastatic disease. 2. Lower lumbar activity corresponds to areas of significant benign spondylosis on the CT scan.   05/24/2017 - 09/05/2017 Chemotherapy   TCHP every 3 weeks for 6 cycles starting 05/24/17 followed by a year of maintenance Herceptin and Perjeta. Given lack of response she stopped TCHP after 4 cycles on 07/26/17.  She will proceeded with Maintenance Herceptin and Perjeta on 08/16/17 and 09/05/17 only due to hesurgical smaple being HER2 negative     07/12/2017 - 07/14/2017 Hospital Admission   Admit date: 07/12/17 Admission diagnosis: GI Hemorrhaging  Additional comments: Colonoscopy and EGD done with no evidence of bleeding site. Given blood transfusion on 07/15/17   08/02/2017 Imaging   MRI Breast Bilateral 2/16//18 IMPRESSION: 1. The known lobular carcinoma of the right breast has not significantly changed since the prior MRI from 05/02/2017. The disease spans approximately 5.6 cm, previously 5.9 cm in the anterior to posterior dimension. 2. 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 3.  No new suspicious findings in either breast. 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   08/16/2017 -  Anti-estrogen oral therapy   Letrozole once daily starting 08/16/17    08/22/2017 Surgery    RIGHT MASTECTOMY WITH SENTINEL LYMPH NODE BIOPSY ERAS PATHWAY by Dr. Georgette Dover   08/22/2017 Pathology Results   See the oncology history under left breast cancer   09/25/2017 - 11/05/2017 Radiation Therapy   She has completed bilateral adjuvant radiation 09/25/17-11/05/17 managed by Dr. Isidore Moos. She tolerated well.   12/17/2017 Imaging   CT AP W Contrast 12/17/17 IMPRESSION: 1. Stable bilateral renal  cysts. No change since prior examination and no worrisome CT imaging features. Stable lower pole right renal cyst. 2. No acute abdominal/pelvic findings or lymphadenopathy. 3. Stable small hepatic cysts. 4. Stable uterine fibroids.   01/22/2018 PET scan   PET 01/22/18 IMPRESSION: 1. Status post right mastectomy and left breast lumpectomy. 2. Minimal left axillary scarring changes near a surgical clip. No findings suspicious for chest wall tumor or axillary or supraclavicular adenopathy. 3. 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  The Hereditary Gene Panel offered by [facility] includes sequencing and/or deletion duplication testing of the following 47 genes: APC, ATM, AXIN2, BARD1, BMPR1A, BRCA1, BRCA2, BRIP1, CDH1, CDK4, CDKN2A (p14ARF), CDKN2A (p16INK4a), CHEK2, CTNNA1, DICER1, EPCAM (Deletion/duplication testing only), GREM1 (promoter region deletion/duplication testing only), KIT, MEN1, MLH1, MSH2, MSH3, MSH6, MUTYH, NBN, NF1, NHTL1, PALB2, PDGFRA, PMS2, POLD1, POLE, PTEN, RAD50, RAD51C, RAD51D, SDHB, SDHC, SDHD, SMAD4, SMARCA4. STK11, TP53, TSC1, TSC2, and VHL.  The following genes were evaluated for sequence changes only: SDHA and HOXB13 c.251G>A variant only. The report date is February 15, 2018.   Cancer of central portion of left breast ([place])  05/02/2017 Breast MRI   IMPRESSION: 1. 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 2. 4 mm enhancing mass with associated architectural distortion in the central left breast. This is suspicious  for additional focus of carcinoma.   05/03/2017 Mammogram   IMPRESSION: 1. Suspicious area of architectural distortion in the left breast which corresponds to a small enhancing mass on  MRI.  RECOMMENDATION: Stereotactic core needle biopsy of the area of architectural distortion in the left breast.    05/03/2017 Breast US   Stereotactic core needle biopsy of the area of architectural distortion in the left breast.   05/03/2017 Initial Biopsy   Breast, left, needle core biopsy, central, slightly toward the lower, outer quadrant - INVASIVE DUCTAL CARCINOMA, MSBR GRADE 1/2. - SEE MICROSCOPIC DESCRIPTION. ER 50% positive PR 90% positive Ki67: 1% HER2 negative    05/07/2017 Initial Diagnosis   Cancer of central portion of left breast ([place])   08/22/2017 Surgery   LEFT BREAST LUMPECTOMY WITH RADIOACTIVE SEED AND SENTINEL LYMPH NODE BIOPSY ERAS PATHWAY by Dr. Georgette Dover 08/22/17    08/22/2017 Pathology Results   Diagnosis 08/22/17 1. Breast, lumpectomy, Left - ATYPICAL DUCTAL HYPERPLASIA - FIBROCYSTIC CHANGES - PREVIOUS BIOPSY SITE CHANGES - SEE COMMENT 2. Lymph node, sentinel, biopsy, a total of 4 nodes, all negative for malignant cells  6. Breast, simple mastectomy, Right - INVASIVE LOBULAR CARCINOMA, [place] GRADE 2/3, 6.0 CM - LOBULAR NEOPLASIA (ATYPICAL LOBULAR HYPERPLASIA) - PREVIOUS BIOPSY SITE CHANGES 7. 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  The Hereditary Gene Panel offered by [facility] includes sequencing and/or deletion duplication testing of the following 47 genes: APC, ATM, AXIN2, BARD1, BMPR1A, BRCA1, BRCA2, BRIP1, CDH1, CDK4, CDKN2A (p14ARF), CDKN2A (p16INK4a), CHEK2, CTNNA1, DICER1, EPCAM (Deletion/duplication testing only), GREM1 (promoter region deletion/duplication testing only), KIT, MEN1, MLH1, MSH2, MSH3, MSH6, MUTYH, NBN, NF1, NHTL1, PALB2, PDGFRA, PMS2, POLD1, POLE, PTEN, RAD50, RAD51C, RAD51D, SDHB, SDHC, SDHD, SMAD4, SMARCA4. STK11, TP53, TSC1, TSC2, and VHL.  The following  genes were evaluated for sequence changes only: SDHA and HOXB13 c.251G>A variant only. The report date is February 15, 2018.      CURRENT THERAPY:  -adjuvant Letrozole once daily started 08/16/17 -Zometa injections every 6 months for 2 years starting 03/21/18   INTERVAL HISTORY:  Madison Stokes is here for a follow up and injection. She was last seen by me 6 months ago. She presents to the clinic alone. She notes she is doing well. She denies nay new changes. She denies chest, back or stomach pain. She notes right leg pain only in the morning and resolves when she gets up. This does not limit what she does. She denies any falls or weakness. She notes she has not been as active lately.  She notes her BP at home is elevated at 145. BP elevated at 168/93 today. She denies HA's. She plans to see her PCP in March or April.    REVIEW OF SYSTEMS:   Constitutional: Denies fevers, chills or abnormal weight loss Eyes: Denies blurriness of vision Ears, nose, mouth, throat, and face: Denies mucositis or sore throat Respiratory: Denies cough, dyspnea or wheezes Cardiovascular: Denies palpitation, chest discomfort or lower extremity swelling Gastrointestinal:  Denies nausea, heartburn or change in bowel habits Skin: Denies abnormal skin rashes MSK: (+) Morning right leg pain Lymphatics: Denies new lymphadenopathy or easy bruising Neurological:Denies numbness, tingling or new weaknesses Behavioral/Psych: Mood is stable, no new changes  All other systems were reviewed with the patient and are negative.  MEDICAL  HISTORY:  Past Medical History:  Diagnosis Date  . Anemia    with chemo  . Cancer ([place]) 04/2017   Bil Breast Ca  . Family history of prostate cancer   . Family history of uterine cancer   . History of radiation therapy 09/25/17- 11/05/17   Left Breast/ 50 Gy in 25 fractions, right breast/ 50 Gy in 25 fractions, right SCLV/ 45 Gy in 25 fractions, right breast boost/ 10 Gy in 5 fractions.    . Hyperlipidemia   . Hypertension   . Personal history of chemotherapy   . Personal history of radiation therapy   . PONV (postoperative nausea and vomiting)    nausea only    SURGICAL HISTORY: Past Surgical History:  Procedure Laterality Date  . BREAST BIOPSY    . BREAST LUMPECTOMY Left    2019  . BREAST LUMPECTOMY WITH RADIOACTIVE SEED AND SENTINEL LYMPH NODE BIOPSY Left 08/22/2017   Procedure: LEFT BREAST LUMPECTOMY WITH RADIOACTIVE SEED AND SENTINEL LYMPH NODE BIOPSY ERAS PATHWAY;  Surgeon: [name], MD;  Location: [facility];  Service: General;  Laterality: Left;  PECTORAL BLOCK  . COLONOSCOPY WITH PROPOFOL Left 07/14/2017   Procedure: COLONOSCOPY WITH PROPOFOL;  Surgeon: Ronnette Juniper, MD;  Location: [facility];  Service: Gastroenterology;  Laterality: Left;  . DILATION AND CURETTAGE OF UTERUS    . ESOPHAGOGASTRODUODENOSCOPY N/A 07/13/2017   Procedure: ESOPHAGOGASTRODUODENOSCOPY (EGD);  Surgeon: Ronnette Juniper, MD;  Location: [facility];  Service: Gastroenterology;  Laterality: N/A;  . MASTECTOMY Right    2019  . MASTECTOMY W/ SENTINEL NODE BIOPSY Right 08/22/2017   Procedure: RIGHT MASTECTOMY WITH SENTINEL LYMPH NODE BIOPSY ERAS PATHWAY;  Surgeon: [name], MD;  Location: [facility];  Service: General;  Laterality: Right;  PECTORAL BLOCK  . PORT-A-CATH REMOVAL Right 11/06/2017   Procedure: REMOVAL PORT-A-CATH;  Surgeon: [name], MD;  Location: [place];  Service: General;  Laterality: Right;  . PORTACATH PLACEMENT Right 05/14/2017   Procedure: ULTRASOUND GUIDED PORT PLACEMENT;  Surgeon: [name], MD;  Location: [facility];  Service: General;  Laterality: Right;    I have reviewed the social history and family history with the patient and they are unchanged from previous note.  ALLERGIES:  has No Known Allergies.  MEDICATIONS:  Current Outpatient Medications  Medication Sig Dispense Refill  . Cholecalciferol (VITAMIN D3 PO) Take  500 Units by mouth daily.    Marland Kitchen aspirin EC 81 MG tablet Take 81 mg by mouth daily.    . Biotin 10 MG CAPS Take by mouth.    . Calcium Citrate-Vitamin D 500-500 MG-UNIT PACK Take 2 tablets by mouth daily.    Marland Kitchen HYDROcodone-acetaminophen (NORCO/VICODIN) 5-325 MG tablet Take 1 tablet by mouth every 6 (six) hours as needed for moderate pain. 12 tablet 0  . irbesartan (AVAPRO) 150 MG tablet Take 300 mg by mouth daily.     Marland Kitchen letrozole (FEMARA) 2.5 MG tablet Take 1 tablet (2.5 mg total) by mouth daily. 90 tablet 3  . Multiple Vitamin (MULTIVITAMIN) tablet Take 1 tablet daily by mouth.    . simvastatin (ZOCOR) 40 MG tablet Take 40 mg at bedtime by mouth.    Marland Kitchen VITAMIN B COMPLEX-C PO Take by mouth.     No current facility-administered medications for this visit.    PHYSICAL EXAMINATION: ECOG PERFORMANCE STATUS: 0 - Asymptomatic  Vitals:   06/29/19 1005 06/29/19 1010  BP: (!) 182/100 (!) 168/93  Pulse: 70   Resp: 20  Temp: 98.3 F (36.8 C)   SpO2: 98%    Filed Weights   06/29/19 1005  Weight: 126 lb 12.8 oz (57.5 kg)    GENERAL:alert, no distress and comfortable SKIN: skin color, texture, turgor are normal, no rashes or significant lesions EYES: normal, Conjunctiva are pink and non-injected (+) right eye redness NECK: supple, thyroid normal size, non-tender, without nodularity LYMPH:  no palpable lymphadenopathy in the cervical, axillary  LUNGS: clear to auscultation and percussion with normal breathing effort HEART: regular rate & rhythm and no murmurs and no lower extremity edema ABDOMEN:abdomen soft, non-tender and normal bowel sounds Musculoskeletal:no cyanosis of digits and no clubbing  NEURO: alert & oriented x 3 with fluent speech, no focal motor/sensory deficits BREAST: s/p right mastectomy with breast surgically absent: Surgical incision healed well. (+) Skin redness lines of right breast. No palpable mass, nodules or adenopathy bilaterally. Breast exam benign.   LABORATORY  DATA:  I have reviewed the data as listed CBC Latest Ref Rng & Units 06/29/2019 12/25/2018 06/23/2018  WBC 4.0 - 10.5 K/uL 4.2 3.9(L) 3.8(L)  Hemoglobin 12.0 - 15.0 g/dL 15.5(H) 14.5 14.5  Hematocrit 36.0 - 46.0 % 45.6 43.6 43.6  Platelets 150 - 400 K/uL 195 189 195     CMP Latest Ref Rng & Units 06/29/2019 12/25/2018 06/23/2018  Glucose 70 - 99 mg/dL 95 106(H) 97  BUN 8 - 23 mg/dL _0 Creatinine 0.44 - 1.00 mg/dL 0.84 0.86 0.84  Sodium 135 - 145 mmol/L 144 143 144  Potassium 3.5 - 5.1 mmol/L 3.8 4.0 4.4  Chloride 98 - 111 mmol/L 106 109 108  CO2 22 - 32 mmol/L _1 Calcium 8.9 - 10.3 mg/dL 9.5 9.6 9.9  Total Protein 6.5 - 8.1 g/dL 7.5 7.3 6.9  Total Bilirubin 0.3 - 1.2 mg/dL 0.6 0.5 0.4  Alkaline Phos 38 - 126 U/L 96 87 76  AST 15 - 41 U/L 28 30 32  ALT 0 - 44 U/L 31 28 40      RADIOGRAPHIC STUDIES: I have personally reviewed the radiological images as listed and agreed with the findings in the report. No results found.   ASSESSMENT & PLAN:  Madison Stokes is a 73 y.o. female with    1. 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 She underwent neoadjuvant TCHP and maintenance Herceptin/Perjeta, s/p right mastectomy, left breast lumpectomy and B/l adjuvant Radiation. -She started anti-estrogen therapy with Letrozole in 07/2017. She is tolerating well with occasional hot flashes and morning right leg pain. Manageable.  -She attended [facility]orship [facility] with NP Lolita Cram 02/05/18. -She waspreviouslyscreened for the NATALEE clinical trial but did not qualify. -She is clinically doing  well. Lab reviewed, her CBC and CMP are within normal limits. Her physical exam and her 04/2019 mammogram were unremarkable. There is no clinical concern for recurrence. -I encouraged her to use lighter bra prosthesis. I also encouraged her to exercise more, such as yoga.  -Continue surveillance. Next mammogram in 04/2020.  -Continue Letrozole.  -F/u in 6 months   2. Genetic was negative for pathogenetic mutations.   3. Bosniak category 41F cyst  in the right mid kidney, 1.2cm Non-obstructive kidney stone -found on 05/22/17 CT Scan  -Her 12/17/17 CT AP showed stable renal and hepatic cysts. No further f/u scan is recommended by radiology   4.Osteopenia -Her 01/2018 DEXA showed osteopenia with the lowest T-Score at AP Spine at -2. -She is onZometa injectionssince9/27/19and continue every 6 months for 2 years -Continue Calcium and VitD supplement daily. -Will proceed with Zometa injectiontoday, next and last in 12/2019. Due for DEXA in 2021.   5. HTN, recent Hyperglycemia  -She is not on medication currently.  -Per pt her BP has been elevated around 145. BP at 168/93 today (06/29/19). I discussed if this remains higher or higher at home she should see her PCP within the next month.  -BG has been normal lately.    PLAN: -Proceed with Zometa infusion today  -Lab and f/uand Zometa infusionin 6 months  -Continue Letrozole    No problem-specific Assessment & Plan notes found for this encounter.   No orders of the defined types were placed in this encounter.  All questions were answered. The patient knows to call the clinic with any problems, questions or concerns. No barriers to learning was detected. The total time spent in the appointment was 20 minutes.     [name], MD 06/29/2019   I, Joslyn Devon, am acting as scribe for [name], MD.   I have reviewed the above documentation for accuracy and completeness, and I agree with the above.

## 2019-06-29 ENCOUNTER — Inpatient Hospital Stay: Payer: Medicare Other | Attending: Hematology

## 2019-06-29 ENCOUNTER — Inpatient Hospital Stay (HOSPITAL_BASED_OUTPATIENT_CLINIC_OR_DEPARTMENT_OTHER): Payer: Medicare Other | Admitting: Hematology

## 2019-06-29 ENCOUNTER — Inpatient Hospital Stay: Payer: Medicare Other

## 2019-06-29 ENCOUNTER — Other Ambulatory Visit: Payer: Self-pay

## 2019-06-29 ENCOUNTER — Encounter: Payer: Self-pay | Admitting: Hematology

## 2019-06-29 VITALS — BP 168/93 | HR 70 | Temp 98.3°F | Resp 20 | Ht 62.0 in | Wt 126.8 lb

## 2019-06-29 DIAGNOSIS — M8580 Other specified disorders of bone density and structure, unspecified site: Secondary | ICD-10-CM | POA: Diagnosis not present

## 2019-06-29 DIAGNOSIS — C50811 Malignant neoplasm of overlapping sites of right female breast: Secondary | ICD-10-CM | POA: Insufficient documentation

## 2019-06-29 DIAGNOSIS — Z170 Estrogen receptor positive status [ER+]: Secondary | ICD-10-CM

## 2019-06-29 DIAGNOSIS — C50112 Malignant neoplasm of central portion of left female breast: Secondary | ICD-10-CM | POA: Diagnosis not present

## 2019-06-29 LAB — CMP (CANCER CENTER ONLY)
ALT: 31 U/L (ref 0–44)
AST: 28 U/L (ref 15–41)
Albumin: 4.4 g/dL (ref 3.5–5.0)
Alkaline Phosphatase: 96 U/L (ref 38–126)
Anion gap: 12 (ref 5–15)
BUN: 18 mg/dL (ref 8–23)
CO2: 26 mmol/L (ref 22–32)
Calcium: 9.5 mg/dL (ref 8.9–10.3)
Chloride: 106 mmol/L (ref 98–111)
Creatinine: 0.84 mg/dL (ref 0.44–1.00)
GFR, Est AFR Am: >60 mL/min (ref >60)
GFR, Estimated: >60 mL/min (ref >60)
Glucose, Bld: 95 mg/dL (ref 70–99)
Potassium: 3.8 mmol/L (ref 3.5–5.1)
Sodium: 144 mmol/L (ref 135–145)
Total Bilirubin: 0.6 mg/dL (ref 0.3–1.2)
Total Protein: 7.5 g/dL (ref 6.5–8.1)

## 2019-06-29 LAB — CBC WITH DIFFERENTIAL (CANCER CENTER ONLY)
Abs Immature Granulocytes: 0.02 10*3/uL (ref 0.00–0.07)
Basophils Absolute: 0.1 10*3/uL (ref 0.0–0.1)
Basophils Relative: 1 %
Eosinophils Absolute: 0.2 10*3/uL (ref 0.0–0.5)
Eosinophils Relative: 5 %
HCT: 45.6 % (ref 36.0–46.0)
Hemoglobin: 15.5 g/dL — ABNORMAL HIGH (ref 12.0–15.0)
Immature Granulocytes: 1 %
Lymphocytes Relative: 27 %
Lymphs Abs: 1.1 10*3/uL (ref 0.7–4.0)
MCH: 32.2 pg (ref 26.0–34.0)
MCHC: 34 g/dL (ref 30.0–36.0)
MCV: 94.8 fL (ref 80.0–100.0)
Monocytes Absolute: 0.5 10*3/uL (ref 0.1–1.0)
Monocytes Relative: 11 %
Neutro Abs: 2.4 10*3/uL (ref 1.7–7.7)
Neutrophils Relative %: 55 %
Platelet Count: 195 10*3/uL (ref 150–400)
RBC: 4.81 MIL/uL (ref 3.87–5.11)
RDW: 13.1 % (ref 11.5–15.5)
WBC Count: 4.2 10*3/uL (ref 4.0–10.5)
nRBC: 0 % (ref 0.0–0.2)

## 2019-06-29 MED ORDER — ZOLEDRONIC ACID 4 MG/100ML IV SOLN
INTRAVENOUS | Status: AC
  Filled 2019-06-29: qty 100

## 2019-06-29 MED ORDER — SODIUM CHLORIDE 0.9 % IV SOLN
Freq: Once | INTRAVENOUS | Status: AC
  Filled 2019-06-29: qty 250

## 2019-06-29 MED ORDER — LETROZOLE 2.5 MG PO TABS: 2.5000 mg | ORAL_TABLET | Freq: Every day | ORAL | 3 refills | Status: DC

## 2019-06-29 MED ORDER — ZOLEDRONIC ACID 4 MG/100ML IV SOLN
4.0000 mg | Freq: Once | INTRAVENOUS | Status: AC
  Administered 2019-06-29: 4 mg via INTRAVENOUS

## 2019-06-30 ENCOUNTER — Encounter: Payer: Self-pay | Admitting: Hematology

## 2019-06-30 ENCOUNTER — Telehealth: Payer: Self-pay | Admitting: Hematology

## 2019-06-30 LAB — CANCER ANTIGEN 27.29: CA 27.29: 79.4 U/mL — ABNORMAL HIGH (ref 0.0–38.6)

## 2019-06-30 NOTE — Telephone Encounter (Signed)
I talk with patient regarding schedule  

## 2019-07-09 ENCOUNTER — Encounter: Payer: Self-pay | Admitting: Hematology

## 2019-07-10 ENCOUNTER — Telehealth: Payer: Self-pay

## 2019-07-10 NOTE — Telephone Encounter (Signed)
Faxed signed orders back to [facility] [employer], received confirmation went through, sent to HIM for scan to chart.

## 2019-09-17 DIAGNOSIS — E7800 Pure hypercholesterolemia, unspecified: Secondary | ICD-10-CM | POA: Diagnosis not present

## 2019-09-17 DIAGNOSIS — D509 Iron deficiency anemia, unspecified: Secondary | ICD-10-CM | POA: Diagnosis not present

## 2019-09-17 DIAGNOSIS — I10 Essential (primary) hypertension: Secondary | ICD-10-CM | POA: Diagnosis not present

## 2019-09-17 DIAGNOSIS — C50811 Malignant neoplasm of overlapping sites of right female breast: Secondary | ICD-10-CM | POA: Diagnosis not present

## 2019-09-22 DIAGNOSIS — I10 Essential (primary) hypertension: Secondary | ICD-10-CM | POA: Diagnosis not present

## 2019-10-21 ENCOUNTER — Ambulatory Visit
Admission: RE | Admit: 2019-10-21 | Discharge: 2019-10-21 | Disposition: A | Discharge status: Home / Self Care | Payer: Medicare Other | Source: Ambulatory Visit | Attending: Family Medicine | Admitting: Family Medicine

## 2019-10-21 ENCOUNTER — Other Ambulatory Visit: Payer: Self-pay | Admitting: Family Medicine

## 2019-10-21 ENCOUNTER — Other Ambulatory Visit: Payer: Self-pay

## 2019-10-21 DIAGNOSIS — E7800 Pure hypercholesterolemia, unspecified: Secondary | ICD-10-CM | POA: Diagnosis not present

## 2019-10-21 DIAGNOSIS — R0602 Shortness of breath: Secondary | ICD-10-CM

## 2019-10-21 DIAGNOSIS — I700 Atherosclerosis of aorta: Secondary | ICD-10-CM | POA: Diagnosis not present

## 2019-10-21 DIAGNOSIS — R7303 Prediabetes: Secondary | ICD-10-CM | POA: Diagnosis not present

## 2019-10-21 DIAGNOSIS — C50811 Malignant neoplasm of overlapping sites of right female breast: Secondary | ICD-10-CM | POA: Diagnosis not present

## 2019-10-21 DIAGNOSIS — I10 Essential (primary) hypertension: Secondary | ICD-10-CM | POA: Diagnosis not present

## 2019-12-03 ENCOUNTER — Other Ambulatory Visit: Payer: Self-pay

## 2019-12-03 ENCOUNTER — Ambulatory Visit (INDEPENDENT_AMBULATORY_CARE_PROVIDER_SITE_OTHER): Payer: Medicare Other | Admitting: Internal Medicine

## 2019-12-03 ENCOUNTER — Encounter: Payer: Self-pay | Admitting: *Deleted

## 2019-12-03 ENCOUNTER — Encounter: Payer: Self-pay | Admitting: Internal Medicine

## 2019-12-03 VITALS — BP 120/80 | HR 83 | Ht 62.0 in | Wt 129.0 lb

## 2019-12-03 DIAGNOSIS — I10 Essential (primary) hypertension: Secondary | ICD-10-CM

## 2019-12-03 DIAGNOSIS — Z8249 Family history of ischemic heart disease and other diseases of the circulatory system: Secondary | ICD-10-CM | POA: Diagnosis not present

## 2019-12-03 DIAGNOSIS — E785 Hyperlipidemia, unspecified: Secondary | ICD-10-CM

## 2019-12-03 DIAGNOSIS — I493 Ventricular premature depolarization: Secondary | ICD-10-CM | POA: Diagnosis not present

## 2019-12-03 DIAGNOSIS — R0602 Shortness of breath: Secondary | ICD-10-CM | POA: Diagnosis not present

## 2019-12-03 NOTE — Progress Notes (Signed)
OFFICE CONSULT NOTE  Chief Complaint:  Dyspnea on exertion  Primary Care Physician: [name], MD  HPI:  Madison Stokes is a 74 y.o. female who is being seen today for the evaluation of dyspnea at the request of [name], MD.  This is a pleasant 74 year old female patient whose husband is also my patient.  Unfortunately she has a history of breast cancer recently in 2018 undergoing chemotherapy as well as radiation which was significant.  Other risk factors for heart disease include hypertension and dyslipidemia and there is a family history of heart disease including her father who apparently had an MI in his 72s but died later of heart disease.  Please she has been having some increase in shortness of breath.  She is also noted some worsening palpitations but is generally ignore them.  Her blood pressure has been rising and required the addition of amlodipine recently but is well controlled at home.  Initially was elevated today however came down to 120/80.  She did struggle with some anemia but labs in January showed that that has improved.  Her EKG shows sinus rhythm with poor R wave progression anteriorly and PVCs.  Denies any anginal pain.  Her symptoms are worse typically when walking up hills, cycling or trying to do physical activity.  She says is not consistent but does seem to be recurrent despite the fact that she tries to give it some time to resolve.  PMHx:  Past Medical History:  Diagnosis Date  . Anemia    with chemo  . Cancer ([place]) 04/2017   Bil Breast Ca  . Family history of prostate cancer   . Family history of uterine cancer   . History of radiation therapy 09/25/17- 11/05/17   Left Breast/ 50 Gy in 25 fractions, right breast/ 50 Gy in 25 fractions, right SCLV/ 45 Gy in 25 fractions, right breast boost/ 10 Gy in 5 fractions.   . Hyperlipidemia   . Hypertension   . Personal history of chemotherapy   . Personal history of radiation therapy   . PONV  (postoperative nausea and vomiting)    nausea only    Past Surgical History:  Procedure Laterality Date  . BREAST BIOPSY    . BREAST LUMPECTOMY Left    2019  . BREAST LUMPECTOMY WITH RADIOACTIVE SEED AND SENTINEL LYMPH NODE BIOPSY Left 08/22/2017   Procedure: LEFT BREAST LUMPECTOMY WITH RADIOACTIVE SEED AND SENTINEL LYMPH NODE BIOPSY ERAS PATHWAY;  Surgeon: [name], MD;  Location: [facility];  Service: General;  Laterality: Left;  PECTORAL BLOCK  . COLONOSCOPY WITH PROPOFOL Left 07/14/2017   Procedure: COLONOSCOPY WITH PROPOFOL;  Surgeon: Ronnette Juniper, MD;  Location: [facility];  Service: Gastroenterology;  Laterality: Left;  . DILATION AND CURETTAGE OF UTERUS    . ESOPHAGOGASTRODUODENOSCOPY N/A 07/13/2017   Procedure: ESOPHAGOGASTRODUODENOSCOPY (EGD);  Surgeon: Ronnette Juniper, MD;  Location: [facility];  Service: Gastroenterology;  Laterality: N/A;  . MASTECTOMY Right    2019  . MASTECTOMY W/ SENTINEL NODE BIOPSY Right 08/22/2017   Procedure: RIGHT MASTECTOMY WITH SENTINEL LYMPH NODE BIOPSY ERAS PATHWAY;  Surgeon: [name], MD;  Location: [facility];  Service: General;  Laterality: Right;  PECTORAL BLOCK  . PORT-A-CATH REMOVAL Right 11/06/2017   Procedure: REMOVAL PORT-A-CATH;  Surgeon: [name], MD;  Location: [facility];  Service: General;  Laterality: Right;  . PORTACATH PLACEMENT Right 05/14/2017   Procedure: ULTRASOUND GUIDED PORT PLACEMENT;  Surgeon: [name], MD;  Location: [facility]  ;  Service: General;  Laterality: Right;    FAMHx:  Family History  Problem Relation Age of Onset  . Cancer Mother 58       uterine; d. 76  . Prostate cancer Brother 14  . Heart disease Father   . Heart attack Father   . Diabetes Father   . Hypertension Father   . Bone cancer Maternal Uncle   . Heart attack Paternal Uncle   . Prostate cancer Maternal Grandfather   . Bone cancer Maternal Grandfather   . Bone cancer Maternal Uncle   . Bone cancer  Cousin     SOCHx:   reports that she has never smoked. She has never used smokeless tobacco. She reports current alcohol use. She reports that she does not use drugs.  ALLERGIES:  No Known Allergies  ROS: Pertinent items noted in HPI and remainder of comprehensive ROS otherwise negative.  HOME MEDS: Current Outpatient Medications on File Prior to Visit  Medication Sig Dispense Refill  . aspirin EC 81 MG tablet Take 81 mg by mouth daily.    . Biotin 10 MG CAPS Take by mouth.    . Calcium Citrate-Vitamin D 500-500 MG-UNIT PACK Take 2 tablets by mouth daily.    . Cholecalciferol (VITAMIN D3 PO) Take 500 Units by mouth daily.    Marland Kitchen HYDROcodone-acetaminophen (NORCO/VICODIN) 5-325 MG tablet Take 1 tablet by mouth every 6 (six) hours as needed for moderate pain. 12 tablet 0  . irbesartan (AVAPRO) 150 MG tablet Take 300 mg by mouth daily.     Marland Kitchen letrozole (FEMARA) 2.5 MG tablet Take 1 tablet (2.5 mg total) by mouth daily. 90 tablet 3  . Multiple Vitamin (MULTIVITAMIN) tablet Take 1 tablet daily by mouth.    . simvastatin (ZOCOR) 40 MG tablet Take 40 mg at bedtime by mouth.    Marland Kitchen VITAMIN B COMPLEX-C PO Take by mouth.    . [DISCONTINUED] prochlorperazine (COMPAZINE) 10 MG tablet Take 1 tablet (10 mg total) every 6 (six) hours as needed by mouth (Nausea or vomiting). 30 tablet 1   No current facility-administered medications on file prior to visit.    LABS/IMAGING: No results found for this or any previous visit (from the past 48 hour(s)). No results found.  LIPID PANEL: No results found for: CHOL, TRIG, HDL, CHOLHDL, VLDL, LDLCALC, LDLDIRECT  WEIGHTS: Wt Readings from Last 3 Encounters:  12/03/19 129 lb (58.5 kg)  06/29/19 126 lb 12.8 oz (57.5 kg)  12/25/18 123 lb 11.2 oz (56.1 kg)    VITALS: BP 120/80   Pulse 83   Ht 5\' 2"  (1.575 m)   Wt 129 lb (58.5 kg)   SpO2 98%   BMI 23.59 kg/m   EXAM: General appearance: alert and Mildly anxious, thin Neck: no carotid bruit, no JVD and  thyroid not enlarged, symmetric, no tenderness/mass/nodules Lungs: clear to auscultation bilaterally Heart: regular rate and rhythm, S1, S2 normal, no murmur, click, rub or gallop Abdomen: soft, non-tender; bowel sounds normal; no masses,  no organomegaly Extremities: extremities normal, atraumatic, no cyanosis or edema Pulses: 2+ and symmetric Skin: Pale, warm, dry Neurologic: Grossly normal Psych: Mildly anxious  EKG: Sinus rhythm with PVCs at 83, poor R wave progression anteriorly- personally reviewed  ASSESSMENT: 1. Progressive dyspnea on exertion 2. Hypertension 3. Dyslipidemia 4. PVCs 5. Family history of premature coronary disease  PLAN: 1.   Madison Stokes is describing progressive dyspnea on exertion which may be an anginal equivalent.  She has multiple  cardiac risk factors and recently has had more PVCs as well as more difficult to control hypertension which could be due to ischemia.  I am recommending a Lexiscan Myoview to evaluate for ischemia.  She is not a good candidate for cardiac CT given her anxiety, elevated heart rate and frequent PVCs which could interfere with gating.  We will also get an echo to evaluate LV function and look for pulmonary hypertension or diastolic dysfunction.  Plan follow-up with me afterwards.  She is ready on good medical therapy including aspirin, statin and has good blood pressure control.  Thanks again for the kind referral.  Pixie Casino, MD, FACC, [place] Director of the [facility]  [facility] Diplomate of the American Board of Clinical Lipidology Attending Cardiologist  Direct Dial: [phone]  Fax: [phone]  Website:  [email or web address] 12/03/2019, 5:07 PM

## 2019-12-03 NOTE — Patient Instructions (Signed)
Medication Instructions:  Your physician recommends that you continue on your current medications as directed. Please refer to the Current Medication list given to you today.  *If you need a refill on your cardiac medications before your next appointment, please call your pharmacy*  Testing/Procedures: Echocardiogram @ [address] - 3rd Floor Echocardiography is a painless test that uses sound waves to create images of your heart. It provides your doctor with information about the size and shape of your heart and how well your heart's chambers and valves are working. This procedure takes approximately one hour. There are no restrictions for this procedure.  [facility] Stress Test @ [address] - 3rd Floor Please arrive 15 minutes prior to your appointment time for registration and insurance purposes.   The test will take approximately 3 to 4 hours to complete; you may bring reading material.  If someone comes with you to your appointment, they will need to remain in the main lobby due to limited space in the testing area.    How to prepare for your Myocardial Perfusion Test:  Do not eat or drink 3 hours prior to your test, except you may have water.  Do not consume products containing caffeine (regular or decaffeinated) 12 hours prior to your test. (ex: coffee, chocolate, sodas, tea).  Do wear comfortable clothes (no dresses or overalls) and walking shoes, tennis shoes preferred (No heels or open toe shoes are allowed).  Do NOT wear cologne, perfume, aftershave, or lotions (deodorant is allowed).  If you use an inhaler, use it the AM of your test and bring it with you.   If you use a nebulizer, use it the AM of your test.   If these instructions are not followed, your test will have to be rescheduled.    Follow-Up: At [facility], you and your health needs are our priority.  As part of our continuing mission to provide you with exceptional heart care, we  have created designated Provider Care Teams.  These Care Teams include your primary Cardiologist (physician) and Advanced Practice Providers (APPs -  Physician Assistants and Nurse Practitioners) who all work together to provide you with the care you need, when you need it.  We recommend signing up for the patient portal called "MyChart".  Sign up information is provided on this After Visit Summary.  MyChart is used to connect with patients for Virtual Visits (Telemedicine).  Patients are able to view lab/test results, encounter notes, upcoming appointments, etc.  Non-urgent messages can be sent to your provider as well.   To learn more about what you can do with MyChart, go to [email or web address].    Your next appointment:   After testing is compelted  The format for your next appointment:   In Person  Provider:   You may see Dr. [name] or one of the following Advanced Practice Providers on your designated Care Team:    Almyra Deforest, PA-C  Fabian Sharp, [place] or   Roby Lofts, [place]    Other Instructions

## 2019-12-23 NOTE — Progress Notes (Signed)
Madison Stokes   [phone] [phone]   Clinic Follow up Note   Patient Care Team: [name], MD as PCP - General (Family Medicine) [name], [name], MD as PCP - Cardiology (Cardiology) [name], MD as Consulting Physician (Hematology) [name], NP as Nurse Practitioner (Nurse Practitioner) [name], MD as Consulting Physician (General Surgery) [name], NP as Nurse Practitioner (Hematology and Oncology)  Date of Service:  12/30/2019  CHIEF COMPLAINT: F/u of bilateral breast cancer  SUMMARY OF ONCOLOGIC HISTORY: Oncology History Overview Note  Cancer Staging Cancer of central portion of left breast [facility]) Staging form: Breast, AJCC 8th Edition - Clinical stage from 05/03/2017: Stage IA (cT1a, cN0, cM0, G1, ER: Positive, PR: Positive, HER2: Negative) - Signed by [name], MD on 05/07/2017 - Pathologic stage from 08/22/2017: No Stage Recommended (ypT0, pN0, cM0, GX, ER: Unknown, PR: Unknown, HER2: Unknown) - Signed by [name], MD on 11/06/2017  Cancer of overlapping sites of right female breast [facility]) Staging form: Breast, AJCC 8th Edition - Clinical stage from 04/24/2017: Stage IIA (cT3, cN0, cM0, G2, ER+, PR+, HER2-) - Signed by [name], MD on 11/06/2017 - Pathologic stage from 08/22/2017: No Stage Recommended (ypT3, pN0(i+), cM0, G2, ER+, PR+, HER2-) - Signed by [name], MD on 10/29/2017     Cancer of overlapping sites of right female breast ([place])  03/08/2017 Mammogram   IMPRESSION: 1. Patient has a is palpable mass in the lateral right breast, in the location dense fibroglandular breast tissue. Although no discrete mass is seen sonographically or mammographically, the Clinical change remains concerning. Biopsy is recommended.  RECOMMENDATION: 1. Ultrasound-guided core needle biopsy of palpable abnormality in the lateral right breast.   04/24/2017 Breast US   IMPRESSION: Ultrasound guided biopsy of the  right breast. No apparent complications.   04/24/2017 Initial Biopsy   Breast, right, needle core biopsy, UOQ, centered at 9:30 o'clock - INVASIVE MAMMARY CARCINOMA. - SEE COMMENT. ER 100% positive PR 100% positive Ki67 10% HER2 FISH positive (ratio 2.10, copy number 3.30)  HER2 IHC was done on the biopsy sample after surgery, and was negative (1+). So the final HER2 is NEGATIVE (determined after surgery)   05/02/2017 Breast MRI   IMPRESSION: 1. 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 2. 4 mm enhancing mass with associated architectural distortion in the central left breast. This is suspicious for additional focus of carcinoma.   05/07/2017 Initial Diagnosis   Cancer of overlapping sites of right female breast ([place])   05/15/2017 Echocardiogram   ECHO 05/15/17  Impressions: - LVEF 60-65%, normal wall thickness, normal wall motion and GLS   strain, grade 1 DD, normal LV filling pressure, mild MR, normal   LA size, normal IVC.      05/22/2017 Imaging   CT CAP W Contrast 05/22/17 IMPRESSION: 1. No specific CT findings of nodal or other metastatic disease in the chest, abdomen, and pelvis. 2. Bosniak category 61F cyst in the right mid kidney, with mild enhancement along a septation but without nodularity. Unless follow up abdominal scans related to the patient's breast cancer adequately characterize this lesion, renal protocol MRI or CT scan would be recommended in 6 months time. 3. Other imaging findings of potential clinical significance: The Aortic Atherosclerosis (ICD10-I70.0). Ectatic ascending thoracic aorta without aneurysm. Small type 1 hiatal hernia. Several small hypodense liver lesions are likely benign although technically too small to  characterize. Nonobstructive 1.2 cm right renal calculus. Anterior uterine fundal myometrial mass favoring fibroid. Pelvic floor laxity with small  cystocele. Notable spondylosis at L3-4 and L5-S1.    05/22/2017 Imaging   Bone Scan Whole Body 05/22/17 IMPRESSION: 1. No findings of osseous metastatic disease. 2. Lower lumbar activity corresponds to areas of significant benign spondylosis on the CT scan.   05/24/2017 - 09/05/2017 Chemotherapy   TCHP every 3 weeks for 6 cycles starting 05/24/17 followed by a year of maintenance Herceptin and Perjeta. Given lack of response she stopped TCHP after 4 cycles on 07/26/17.  She will proceeded with Maintenance Herceptin and Perjeta on 08/16/17 and 09/05/17 only due to hesurgical smaple being HER2 negative     07/12/2017 - 07/14/2017 Hospital Admission   Admit date: 07/12/17 Admission diagnosis: GI Hemorrhaging  Additional comments: Colonoscopy and EGD done with no evidence of bleeding site. Given blood transfusion on 07/15/17   08/02/2017 Imaging   MRI Breast Bilateral 2/16//18 IMPRESSION: 1. The known lobular carcinoma of the right breast has not significantly changed since the prior MRI from 05/02/2017. The disease spans approximately 5.6 cm, previously 5.9 cm in the anterior to posterior dimension. 2. 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 3.  No new suspicious findings in either breast. 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   08/16/2017 -  Anti-estrogen oral therapy   Letrozole once daily starting 08/16/17    08/22/2017 Surgery    RIGHT MASTECTOMY WITH SENTINEL LYMPH NODE BIOPSY ERAS PATHWAY by Dr. Georgette Dover   08/22/2017 Pathology Results   See the oncology history under left breast cancer   09/25/2017 - 11/05/2017 Radiation Therapy   She has completed bilateral adjuvant radiation 09/25/17-11/05/17 managed by Dr. Isidore Moos. She tolerated well.   12/17/2017 Imaging   CT AP W  Contrast 12/17/17 IMPRESSION: 1. Stable bilateral renal cysts. No change since prior examination and no worrisome CT imaging features. Stable lower pole right renal cyst. 2. No acute abdominal/pelvic findings or lymphadenopathy. 3. Stable small hepatic cysts. 4. Stable uterine fibroids.   01/22/2018 PET scan   PET 01/22/18 IMPRESSION: 1. Status post right mastectomy and left breast lumpectomy. 2. Minimal left axillary scarring changes near a surgical clip. No findings suspicious for chest wall tumor or axillary or supraclavicular adenopathy. 3. No findings for metastatic disease involving the lungs, abdomen/pelvis or osseous structures   03/21/2018 - 12/30/2019 Chemotherapy   Zometa injections every 6 months for 2 years 03/21/18-12/30/19   Cancer of central portion of left breast ([place])  05/02/2017 Breast MRI   IMPRESSION: 1. 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 2. 4 mm enhancing mass with associated architectural distortion in the central left breast. This is suspicious for additional focus of carcinoma.   05/03/2017 Mammogram   IMPRESSION: 1. Suspicious area of architectural distortion in the left breast which corresponds to a small enhancing mass on MRI.  RECOMMENDATION: Stereotactic core needle biopsy of the area of architectural distortion in the left breast.    05/03/2017 Breast US   Stereotactic core needle biopsy of the area of architectural distortion in the left breast.   05/03/2017 Initial Biopsy   Breast, left, needle core biopsy, central, slightly toward the lower, outer quadrant - INVASIVE DUCTAL CARCINOMA,  MSBR GRADE 1/2. - SEE MICROSCOPIC DESCRIPTION. ER 50% positive PR 90% positive Ki67: 1% HER2 negative    05/07/2017 Initial Diagnosis   Cancer of central portion of left breast ([place])   08/22/2017 Surgery   LEFT BREAST LUMPECTOMY WITH RADIOACTIVE SEED AND SENTINEL LYMPH NODE  BIOPSY ERAS PATHWAY by Dr. Georgette Dover 08/22/17    08/22/2017 Pathology Results   Diagnosis 08/22/17 1. Breast, lumpectomy, Left - ATYPICAL DUCTAL HYPERPLASIA - FIBROCYSTIC CHANGES - PREVIOUS BIOPSY SITE CHANGES - SEE COMMENT 2. Lymph node, sentinel, biopsy, a total of 4 nodes, all negative for malignant cells  6. Breast, simple mastectomy, Right - INVASIVE LOBULAR CARCINOMA, [place] GRADE 2/3, 6.0 CM - LOBULAR NEOPLASIA (ATYPICAL LOBULAR HYPERPLASIA) - PREVIOUS BIOPSY SITE CHANGES 7. 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  The Hereditary Gene Panel offered by [facility] includes sequencing and/or deletion duplication testing of the following 47 genes: APC, ATM, AXIN2, BARD1, BMPR1A, BRCA1, BRCA2, BRIP1, CDH1, CDK4, CDKN2A (p14ARF), CDKN2A (p16INK4a), CHEK2, CTNNA1, DICER1, EPCAM (Deletion/duplication testing only), GREM1 (promoter region deletion/duplication testing only), KIT, MEN1, MLH1, MSH2, MSH3, MSH6, MUTYH, NBN, NF1, NHTL1, PALB2, PDGFRA, PMS2, POLD1, POLE, PTEN, RAD50, RAD51C, RAD51D, SDHB, SDHC, SDHD, SMAD4, SMARCA4. STK11, TP53, TSC1, TSC2, and VHL.  The following genes were evaluated for sequence changes only: SDHA and HOXB13 c.251G>A variant only. The report date is February 15, 2018.      CURRENT THERAPY:  -adjuvant Letrozole once daily started 08/16/17 -Zometa injections every 6 months for 2 years 03/21/18-12/30/19  INTERVAL HISTORY:  Madison Stokes is here for a follow up of b/l breast cancer. She was last seen by me 6 months ago. She presents to the clinic alone. She notes she is doing well. She notes she had her HTN medications improved. Her cardiac stress test yesterday was good. Her BP improved. She notes she is eating well and denies breathing or abdominal issues. She still has chronic right hip pain,   manageable. She has been tolerating her Zometa infusions and Letrozole. She has been taking calcium and Vit D. She notes upper posterior cervical tenderness for the past few days. She is note sure what the cause is.     REVIEW OF SYSTEMS:   Constitutional: Denies fevers, chills or abnormal weight loss Eyes: Denies blurriness of vision Ears, nose, mouth, throat, and face: Denies mucositis or sore throat Respiratory: Denies cough, dyspnea or wheezes Cardiovascular: Denies palpitation, chest discomfort or lower extremity swelling Gastrointestinal:  Denies nausea, heartburn or change in bowel habits Skin: Denies abnormal skin rashes MSK: (+) Chronic right hip pain (+) Upper posterior neck pain, tenderness  Lymphatics: Denies new lymphadenopathy or easy bruising Neurological:Denies numbness, tingling or new weaknesses Behavioral/Psych: Mood is stable, no new changes  All other systems were reviewed with the patient and are negative.  MEDICAL HISTORY:  Past Medical History:  Diagnosis Date  . Anemia    with chemo  . Cancer ([place]) 04/2017   Bil Breast Ca  . Family history of prostate cancer   . Family history of uterine cancer   . History of radiation therapy 09/25/17- 11/05/17   Left Breast/ 50 Gy in 25 fractions, right breast/ 50 Gy in 25 fractions, right SCLV/ 45 Gy in 25 fractions, right breast boost/ 10 Gy in 5 fractions.   . Hyperlipidemia   .  Hypertension   . Personal history of chemotherapy   . Personal history of radiation therapy   . PONV (postoperative nausea and vomiting)    nausea only    SURGICAL HISTORY: Past Surgical History:  Procedure Laterality Date  . BREAST BIOPSY    . BREAST LUMPECTOMY Left    2019  . BREAST LUMPECTOMY WITH RADIOACTIVE SEED AND SENTINEL LYMPH NODE BIOPSY Left 08/22/2017   Procedure: LEFT BREAST LUMPECTOMY WITH RADIOACTIVE SEED AND SENTINEL LYMPH NODE BIOPSY ERAS PATHWAY;  Surgeon: [name], MD;  Location: [facility];  Service: General;   Laterality: Left;  PECTORAL BLOCK  . COLONOSCOPY WITH PROPOFOL Left 07/14/2017   Procedure: COLONOSCOPY WITH PROPOFOL;  Surgeon: Ronnette Juniper, MD;  Location: [facility];  Service: Gastroenterology;  Laterality: Left;  . DILATION AND CURETTAGE OF UTERUS    . ESOPHAGOGASTRODUODENOSCOPY N/A 07/13/2017   Procedure: ESOPHAGOGASTRODUODENOSCOPY (EGD);  Surgeon: Ronnette Juniper, MD;  Location: [facility];  Service: Gastroenterology;  Laterality: N/A;  . MASTECTOMY Right    2019  . MASTECTOMY W/ SENTINEL NODE BIOPSY Right 08/22/2017   Procedure: RIGHT MASTECTOMY WITH SENTINEL LYMPH NODE BIOPSY ERAS PATHWAY;  Surgeon: [name], MD;  Location: [facility];  Service: General;  Laterality: Right;  PECTORAL BLOCK  . PORT-A-CATH REMOVAL Right 11/06/2017   Procedure: REMOVAL PORT-A-CATH;  Surgeon: [name], MD;  Location: [facility];  Service: General;  Laterality: Right;  . PORTACATH PLACEMENT Right 05/14/2017   Procedure: ULTRASOUND GUIDED PORT PLACEMENT;  Surgeon: [name], MD;  Location: [facility];  Service: General;  Laterality: Right;    I have reviewed the social history and family history with the patient and they are unchanged from previous note.  ALLERGIES:  has No Known Allergies.  MEDICATIONS:  Current Outpatient Medications  Medication Sig Dispense Refill  . amLODipine (NORVASC) 5 MG tablet Take 5 mg by mouth daily.    Marland Kitchen aspirin EC 81 MG tablet Take 81 mg by mouth daily.    Marland Kitchen atorvastatin (LIPITOR) 40 MG tablet Take 40 mg by mouth daily.    . Biotin 10 MG CAPS Take by mouth.    . Calcium Citrate-Vitamin D 500-500 MG-UNIT PACK Take 2 tablets by mouth daily.    . Cholecalciferol (VITAMIN D3 PO) Take 500 Units by mouth daily.    . irbesartan (AVAPRO) 150 MG tablet Take 300 mg by mouth daily.     . irbesartan (AVAPRO) 300 MG tablet Take 300 mg by mouth daily.    Marland Kitchen letrozole (FEMARA) 2.5 MG tablet Take 1 tablet (2.5 mg total) by mouth daily. 90 tablet 3    . Multiple Vitamin (MULTIVITAMIN) tablet Take 1 tablet daily by mouth.    Marland Kitchen VITAMIN B COMPLEX-C PO Take by mouth.     No current facility-administered medications for this visit.    PHYSICAL EXAMINATION: ECOG PERFORMANCE STATUS: 0 - Asymptomatic  Vitals:   12/30/19 1051  BP: (!) 144/81  Pulse: 71  Resp: 18  Temp: 97.9 F (36.6 C)  SpO2: 100%   Filed Weights   12/30/19 1051  Weight: 125 lb 4.8 oz (56.8 kg)    GENERAL:alert, no distress and comfortable SKIN: skin color, texture, turgor are normal, no rashes or significant lesions EYES: normal, Conjunctiva are pink and non-injected, sclera clear (+) Mild pinkish skin of b/l lower legs NECK: supple, thyroid normal size, non-tender, without nodularity  LYMPH:  no palpable lymphadenopathy in the cervical, axillary  LUNGS: clear to auscultation and percussion with normal  breathing effort HEART: regular rate & rhythm and no murmurs and no lower extremity edema ABDOMEN:abdomen soft, non-tender and normal bowel sounds Musculoskeletal:no cyanosis of digits and no clubbing (+) upper posterior cervical tenderness  NEURO: alert & oriented x 3 with fluent speech, no focal motor/sensory deficits BREAST: S/p right mastectomy and left lumpectomy: Surgical incisions healed well with mild scar tissue. No palpable mass, nodules or adenopathy bilaterally. Breast exam benign.   LABORATORY DATA:  I have reviewed the data as listed CBC Latest Ref Rng & Units 12/30/2019 06/29/2019 12/25/2018  WBC 4.0 - 10.5 K/uL 4.7 4.2 3.9(L)  Hemoglobin 12.0 - 15.0 g/dL 14.7 15.5(H) 14.5  Hematocrit 36 - 46 % 43.9 45.6 43.6  Platelets 150 - 400 K/uL 212 195 189     CMP Latest Ref Rng & Units 12/30/2019 06/29/2019 12/25/2018  Glucose 70 - 99 mg/dL 92 95 106(H)  BUN 8 - 23 mg/dL _0 Creatinine 0.44 - 1.00 mg/dL 0.86 0.84 0.86  Sodium 135 - 145 mmol/L 143 144 143  Potassium 3.5 - 5.1 mmol/L 4.3 3.8 4.0  Chloride 98 - 111 mmol/L 108 106 109  CO2 22 - 32 mmol/L _1 Calcium 8.9 - 10.3 mg/dL 9.9 9.5 9.6  Total Protein 6.5 - 8.1 g/dL 7.4 7.5 7.3  Total Bilirubin 0.3 - 1.2 mg/dL 0.7 0.6 0.5  Alkaline Phos 38 - 126 U/L 79 96 87  AST 15 - 41 U/L 32 28 30  ALT 0 - 44 U/L 37 31 28      RADIOGRAPHIC STUDIES: I have personally reviewed the radiological images as listed and agreed with the findings in the report. MYOCARDIAL PERFUSION IMAGING  Result Date: 12/29/2019  Nuclear stress EF: 70%.  There was no ST segment deviation noted during stress.  No prior study for comparison.  The study is normal.  This is a low risk study.  There are overall low counts in the myocardium and elevated extracardiac counts. Imaging is good enough to exclude ischemia or scar. Low risk study.   ECHOCARDIOGRAM COMPLETE  Result Date: 12/29/2019    ECHOCARDIOGRAM REPORT   Patient Name:   Madison Stokes Date of Exam: 12/29/2019 Medical Rec #:  [id number]        Height:       62.0 in Accession #:    [id number]       Weight:       129.0 lb Date of Birth:  February 13, 1946        BSA:          1.587 m Patient Age:    74 years         BP:           120/80 mmHg Patient Gender: F                HR:           102 bpm. Exam Location:  [address] Procedure: 2D Echo, Cardiac Doppler and Color Doppler Indications:    R06.00 Dyspnea  History:        Patient has prior history of Echocardiogram examinations, most                 recent 09/20/2017. Signs/Symptoms:Dyspnea and Shortness of                 Breath; Risk Factors:Hypertension, Dyslipidemia and Family  History of Coronary Artery Disease. Bilateral Breast Cancer                 (2018- statust post Lumpectomy/Mastectomy and Chemo/Radiaiton).  Sonographer:    Deliah [place] RDCS Referring Phys: [phone] KENNETH C HILTY IMPRESSIONS  1. Left ventricular ejection fraction, by estimation, is 60 to 65%. The left ventricle has normal function. The left ventricle has no regional wall motion abnormalities. Left ventricular diastolic  parameters are consistent with Grade I diastolic dysfunction (impaired relaxation).  2. Right ventricular systolic function is normal. The right ventricular size is normal. Tricuspid regurgitation signal is inadequate for assessing PA pressure.  3. The mitral valve is grossly normal. Trivial mitral valve regurgitation.  4. The aortic valve is tricuspid. Aortic valve regurgitation is not visualized.  5. Aortic dilatation noted. There is mild dilatation of the ascending aorta measuring 39 mm.  6. The inferior vena cava is normal in size with greater than 50% respiratory variability, suggesting right atrial pressure of 3 mmHg. FINDINGS  Left Ventricle: Left ventricular ejection fraction, by estimation, is 60 to 65%. The left ventricle has normal function. The left ventricle has no regional wall motion abnormalities. The left ventricular internal cavity size was normal in size. There is  no left ventricular hypertrophy. Left ventricular diastolic parameters are consistent with Grade I diastolic dysfunction (impaired relaxation). Normal left ventricular filling pressure. Right Ventricle: The right ventricular size is normal. No increase in right ventricular wall thickness. Right ventricular systolic function is normal. Tricuspid regurgitation signal is inadequate for assessing PA pressure. Left Atrium: Left atrial size was normal in size. Right Atrium: Right atrial size was normal in size. Pericardium: There is no evidence of pericardial effusion. Mitral Valve: The mitral valve is grossly normal. Trivial mitral valve regurgitation. Tricuspid Valve: The tricuspid valve is grossly normal. Tricuspid valve regurgitation is trivial. Aortic Valve: The aortic valve is tricuspid. Aortic valve regurgitation is not visualized. Pulmonic Valve: The pulmonic valve was grossly normal. Pulmonic valve regurgitation is trivial. Aorta: Aortic dilatation noted. There is mild dilatation of the ascending aorta measuring 39 mm. Venous: The  inferior vena cava is normal in size with greater than 50% respiratory variability, suggesting right atrial pressure of 3 mmHg. IAS/Shunts: No atrial level shunt detected by color flow Doppler.  LEFT VENTRICLE PLAX 2D LVIDd:         4.20 cm  Diastology LVIDs:         2.30 cm  LV e' lateral:   7.62 cm/s LV PW:         0.80 cm  LV E/e' lateral: 6.2 LV IVS:        0.70 cm  LV e' medial:    5.66 cm/s LVOT diam:     2.10 cm  LV E/e' medial:  8.4 LV SV:         76 LV SV Index:   48 LVOT Area:     3.46 cm  RIGHT VENTRICLE RV S prime:     9.64 cm/s TAPSE (M-mode): 1.4 cm LEFT ATRIUM             Index       RIGHT ATRIUM           Index LA diam:        3.30 cm 2.08 cm/m  RA Area:     10.70 cm LA Vol (A2C):   37.1 ml 23.38 ml/m RA Volume:   21.00 ml  13.24 ml/m LA Vol (A4C):  26.8 ml 16.89 ml/m LA Biplane Vol: 31.4 ml 19.79 ml/m  AORTIC VALVE LVOT Vmax:   95.00 cm/s LVOT Vmean:  55.200 cm/s LVOT VTI:    0.219 m  AORTA Ao Root diam: 3.65 cm Ao Asc diam:  3.90 cm MITRAL VALVE MV Area (PHT): cm         SHUNTS MV Decel Time: 257 msec    Systemic VTI:  0.22 m MV E velocity: 47.30 cm/s  Systemic Diam: 2.10 cm MV A velocity: 85.85 cm/s MV E/A ratio:  0.55 Lyman Bishop MD Electronically signed by Lyman Bishop MD Signature Date/Time: 12/29/2019/11:02:24 AM    Final      ASSESSMENT & PLAN:  Madison Stokes is a 74 y.o. female with    1. 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 She underwent neoadjuvant TCHP and maintenance Herceptin/Perjeta, s/p right mastectomy, left breast lumpectomy and B/l adjuvant Radiation. -She started  anti-estrogen therapy with Letrozole in 07/2017. She is tolerating well with occasional hot flashes and morning right leg pain. Manageable.  -She attended [facility]orship [facility] with NP Lolita Cram 02/05/18. -She waspreviouslyscreened for the NATALEE clinical trial but did not qualify. -She is clinically doing well and stable. Lab reviewed, her CBC and CMP are within normal limits. Her physical exam and her 04/2019 mammogram were unremarkable. There is no clinical concern for recurrence. -Continue surveillance. Next left mammogram in 04/2020.  -Continue Letrozole.  -F/u in 6 months   2. Genetic was negative for pathogenetic mutations.   3. Bosniak category 21F cyst in the right mid kidney, 1.2cm Non-obstructive kidney stone -found on 05/22/17 CT Scan  -Her 12/17/17 CT AP showed stable renal and hepatic cysts. No further f/u scan is recommended by radiology   4.Osteopenia -Her 01/2018 DEXA showed osteopenia with the lowest T-Score at [facility] at -2. -She is onZometa injectionssince9/27/19and continue every 6 months for 2 years. Proceed with last Zometa today (12/30/19).  -Continue Calcium and VitD supplement daily. -Next Bone Density due 01/2020.   5. HTN -In the past 6 months her HTN medications were restarted and increased.  -BP has improved.    PLAN: -Proceed with last Zometa infusion today  -Left Mammogram and DEXA in 04/2020  -Continue Letrozole  -Lab and f/u in 6 months    No problem-specific Assessment & Plan notes found for this encounter.   Orders Placed This Encounter  Procedures  . MM DIAG BREAST TOMO UNI LEFT    Standing Status:   Future    Standing Expiration Date:   12/29/2020    Order Specific Question:   Reason for Exam (SYMPTOM  OR DIAGNOSIS REQUIRED)    Answer:   screening    Order Specific Question:   Preferred imaging location?    Answer:   [facility]  . DG Bone Density    Standing Status:   Future    Standing Expiration Date:    12/29/2020    Order Specific Question:   Reason for Exam (SYMPTOM  OR DIAGNOSIS REQUIRED)    Answer:   screening    Order Specific Question:   Preferred imaging location?    Answer:  [facility]   All questions were answered. The patient knows to call the clinic with any problems, questions or concerns. No barriers to learning was detected. The total time spent in the appointment was 30 minutes.     [name], MD 12/30/2019   I, Joslyn Devon, am acting as scribe for [name], MD.   I have reviewed the above documentation for accuracy and completeness, and I agree with the above.

## 2019-12-25 ENCOUNTER — Telehealth (HOSPITAL_COMMUNITY): Payer: Self-pay | Admitting: *Deleted

## 2019-12-25 NOTE — Telephone Encounter (Signed)
Patient given detailed instructions per Myocardial Perfusion Study Information Sheet for the test on 12/29/19 at 10:00. Patient notified to arrive 15 minutes early and that it is imperative to arrive on time for appointment to keep from having the test rescheduled.  If you need to cancel or reschedule your appointment, please call the office within 24 hours of your appointment. . Patient verbalized understanding.Madison Stokes

## 2019-12-29 ENCOUNTER — Ambulatory Visit (HOSPITAL_BASED_OUTPATIENT_CLINIC_OR_DEPARTMENT_OTHER): Payer: Medicare Other

## 2019-12-29 ENCOUNTER — Ambulatory Visit (HOSPITAL_COMMUNITY): Payer: Medicare Other | Attending: Internal Medicine

## 2019-12-29 ENCOUNTER — Other Ambulatory Visit: Payer: Self-pay

## 2019-12-29 DIAGNOSIS — I10 Essential (primary) hypertension: Secondary | ICD-10-CM | POA: Diagnosis not present

## 2019-12-29 DIAGNOSIS — Z8249 Family history of ischemic heart disease and other diseases of the circulatory system: Secondary | ICD-10-CM | POA: Insufficient documentation

## 2019-12-29 DIAGNOSIS — E785 Hyperlipidemia, unspecified: Secondary | ICD-10-CM

## 2019-12-29 DIAGNOSIS — R0602 Shortness of breath: Secondary | ICD-10-CM | POA: Diagnosis not present

## 2019-12-29 LAB — MYOCARDIAL PERFUSION IMAGING
LV dias vol: 43 mL (ref 46–106)
LV sys vol: 13 mL
Peak HR: 127 {beats}/min
Rest HR: 85 {beats}/min
SDS: 2
SRS: 0
SSS: 2
TID: 0.96

## 2019-12-29 MED ORDER — TECHNETIUM TC 99M TETROFOSMIN IV KIT
32.7000 | PACK | Freq: Once | INTRAVENOUS | Status: AC | PRN
  Administered 2019-12-29: 32.7 via INTRAVENOUS
  Filled 2019-12-29: qty 33

## 2019-12-29 MED ORDER — REGADENOSON 0.4 MG/5ML IV SOLN
0.4000 mg | Freq: Once | INTRAVENOUS | Status: AC
  Administered 2019-12-29: 0.4 mg via INTRAVENOUS

## 2019-12-29 MED ORDER — TECHNETIUM TC 99M TETROFOSMIN IV KIT
11.0000 | PACK | Freq: Once | INTRAVENOUS | Status: AC | PRN
  Administered 2019-12-29: 11 via INTRAVENOUS
  Filled 2019-12-29: qty 11

## 2019-12-30 ENCOUNTER — Other Ambulatory Visit: Payer: Self-pay

## 2019-12-30 ENCOUNTER — Encounter: Payer: Self-pay | Admitting: Hematology

## 2019-12-30 ENCOUNTER — Inpatient Hospital Stay: Payer: Medicare Other

## 2019-12-30 ENCOUNTER — Inpatient Hospital Stay (HOSPITAL_BASED_OUTPATIENT_CLINIC_OR_DEPARTMENT_OTHER): Payer: Medicare Other | Admitting: Hematology

## 2019-12-30 ENCOUNTER — Inpatient Hospital Stay: Payer: Medicare Other | Attending: Hematology

## 2019-12-30 VITALS — BP 144/81 | HR 71 | Temp 97.9°F | Resp 18 | Ht 62.0 in | Wt 125.3 lb

## 2019-12-30 DIAGNOSIS — Z170 Estrogen receptor positive status [ER+]: Secondary | ICD-10-CM | POA: Diagnosis not present

## 2019-12-30 DIAGNOSIS — C50811 Malignant neoplasm of overlapping sites of right female breast: Secondary | ICD-10-CM | POA: Diagnosis not present

## 2019-12-30 DIAGNOSIS — C50112 Malignant neoplasm of central portion of left female breast: Secondary | ICD-10-CM

## 2019-12-30 DIAGNOSIS — E2839 Other primary ovarian failure: Secondary | ICD-10-CM | POA: Diagnosis not present

## 2019-12-30 DIAGNOSIS — M8580 Other specified disorders of bone density and structure, unspecified site: Secondary | ICD-10-CM | POA: Diagnosis not present

## 2019-12-30 LAB — CBC WITH DIFFERENTIAL (CANCER CENTER ONLY)
Abs Immature Granulocytes: 0.01 10*3/uL (ref 0.00–0.07)
Basophils Absolute: 0.1 10*3/uL (ref 0.0–0.1)
Basophils Relative: 1 %
Eosinophils Absolute: 0.2 10*3/uL (ref 0.0–0.5)
Eosinophils Relative: 4 %
HCT: 43.9 % (ref 36.0–46.0)
Hemoglobin: 14.7 g/dL (ref 12.0–15.0)
Immature Granulocytes: 0 %
Lymphocytes Relative: 26 %
Lymphs Abs: 1.2 10*3/uL (ref 0.7–4.0)
MCH: 31.7 pg (ref 26.0–34.0)
MCHC: 33.5 g/dL (ref 30.0–36.0)
MCV: 94.8 fL (ref 80.0–100.0)
Monocytes Absolute: 0.6 10*3/uL (ref 0.1–1.0)
Monocytes Relative: 13 %
Neutro Abs: 2.6 10*3/uL (ref 1.7–7.7)
Neutrophils Relative %: 56 %
Platelet Count: 212 10*3/uL (ref 150–400)
RBC: 4.63 MIL/uL (ref 3.87–5.11)
RDW: 13.5 % (ref 11.5–15.5)
WBC Count: 4.7 10*3/uL (ref 4.0–10.5)
nRBC: 0 % (ref 0.0–0.2)

## 2019-12-30 LAB — CMP (CANCER CENTER ONLY)
ALT: 37 U/L (ref 0–44)
AST: 32 U/L (ref 15–41)
Albumin: 4.3 g/dL (ref 3.5–5.0)
Alkaline Phosphatase: 79 U/L (ref 38–126)
Anion gap: 12 (ref 5–15)
BUN: 21 mg/dL (ref 8–23)
CO2: 23 mmol/L (ref 22–32)
Calcium: 9.9 mg/dL (ref 8.9–10.3)
Chloride: 108 mmol/L (ref 98–111)
Creatinine: 0.86 mg/dL (ref 0.44–1.00)
GFR, Est AFR Am: >60 mL/min (ref >60)
GFR, Estimated: >60 mL/min (ref >60)
Glucose, Bld: 92 mg/dL (ref 70–99)
Potassium: 4.3 mmol/L (ref 3.5–5.1)
Sodium: 143 mmol/L (ref 135–145)
Total Bilirubin: 0.7 mg/dL (ref 0.3–1.2)
Total Protein: 7.4 g/dL (ref 6.5–8.1)

## 2019-12-30 MED ORDER — SODIUM CHLORIDE 0.9 % IV SOLN
Freq: Once | INTRAVENOUS | Status: AC
  Filled 2019-12-30: qty 250

## 2019-12-30 MED ORDER — ZOLEDRONIC ACID 4 MG/100ML IV SOLN
4.0000 mg | Freq: Once | INTRAVENOUS | Status: AC
  Administered 2019-12-30: 4 mg via INTRAVENOUS

## 2019-12-30 MED ORDER — ZOLEDRONIC ACID 4 MG/100ML IV SOLN
INTRAVENOUS | Status: AC
  Filled 2019-12-30: qty 100

## 2019-12-30 NOTE — Patient Instructions (Signed)
[facility] Discharge Instructions for Patients Receiving Chemotherapy  Today you received the following chemotherapy agents: Zometa  To help prevent nausea and vomiting after your treatment, we encourage you to take your nausea medication as prescribed.    If you develop nausea and vomiting that is not controlled by your nausea medication, call the clinic.   BELOW ARE SYMPTOMS THAT SHOULD BE REPORTED IMMEDIATELY:  *FEVER GREATER THAN 100.5 F  *CHILLS WITH OR WITHOUT FEVER  NAUSEA AND VOMITING THAT IS NOT CONTROLLED WITH YOUR NAUSEA MEDICATION  *UNUSUAL SHORTNESS OF BREATH  *UNUSUAL BRUISING OR BLEEDING  TENDERNESS IN MOUTH AND THROAT WITH OR WITHOUT PRESENCE OF ULCERS  *URINARY PROBLEMS  *BOWEL PROBLEMS  UNUSUAL RASH Items with * indicate a potential emergency and should be followed up as soon as possible.  Feel free to call the clinic should you have any questions or concerns. The clinic phone number is [phone].  Please show the [place] at check-in to the Emergency Department and triage nurse.

## 2019-12-31 ENCOUNTER — Telehealth: Payer: Self-pay | Admitting: Hematology

## 2019-12-31 LAB — CANCER ANTIGEN 27.29: CA 27.29: 82 U/mL — ABNORMAL HIGH (ref 0.0–38.6)

## 2019-12-31 NOTE — Telephone Encounter (Signed)
Scheduled per 7/7 los. Pt is aware of appt time and date. 

## 2020-01-02 ENCOUNTER — Encounter: Payer: Self-pay | Admitting: Hematology

## 2020-01-11 ENCOUNTER — Other Ambulatory Visit: Payer: Self-pay

## 2020-01-11 ENCOUNTER — Ambulatory Visit (INDEPENDENT_AMBULATORY_CARE_PROVIDER_SITE_OTHER): Payer: Medicare Other | Admitting: Internal Medicine

## 2020-01-11 ENCOUNTER — Encounter: Payer: Self-pay | Admitting: Internal Medicine

## 2020-01-11 VITALS — BP 125/72 | HR 72 | Temp 96.8°F | Ht 62.0 in | Wt 125.0 lb

## 2020-01-11 DIAGNOSIS — I493 Ventricular premature depolarization: Secondary | ICD-10-CM | POA: Diagnosis not present

## 2020-01-11 DIAGNOSIS — R0602 Shortness of breath: Secondary | ICD-10-CM | POA: Diagnosis not present

## 2020-01-11 DIAGNOSIS — I10 Essential (primary) hypertension: Secondary | ICD-10-CM | POA: Diagnosis not present

## 2020-01-11 DIAGNOSIS — I77810 Thoracic aortic ectasia: Secondary | ICD-10-CM

## 2020-01-11 NOTE — Patient Instructions (Signed)
Medication Instructions:  Your physician recommends that you continue on your current medications as directed. Please refer to the Current Medication list given to you today.  *If you need a refill on your cardiac medications before your next appointment, please call your pharmacy*   Follow-Up: At [facility], you and your health needs are our priority.  As part of our continuing mission to provide you with exceptional heart care, we have created designated Provider Care Teams.  These Care Teams include your primary Cardiologist (physician) and Advanced Practice Providers (APPs -  Physician Assistants and Nurse Practitioners) who all work together to provide you with the care you need, when you need it.  We recommend signing up for the patient portal called "MyChart".  Sign up information is provided on this After Visit Summary.  MyChart is used to connect with patients for Virtual Visits (Telemedicine).  Patients are able to view lab/test results, encounter notes, upcoming appointments, etc.  Non-urgent messages can be sent to your provider as well.   To learn more about what you can do with MyChart, go to [email or web address].    Your next appointment:   AS NEEDED  Provider:   You may see Pixie Casino, MD or one of the following Advanced Practice Providers on your designated Care Team:    Almyra Deforest, PA-C  Fabian Sharp, PA-C or   Roby Lofts, [place]    Other Instructions

## 2020-01-11 NOTE — Progress Notes (Signed)
OFFICE CONSULT NOTE  Chief Complaint:  Follow-up dyspnea on exertion  Primary Care Physician: [name], MD  HPI:  Madison Stokes is a 74 y.o. female who is being seen today for the evaluation of dyspnea at the request of [name], MD.  This is a pleasant 74 year old female patient whose husband is also my patient.  Unfortunately she has a history of breast cancer recently in 2018 undergoing chemotherapy as well as radiation which was significant.  Other risk factors for heart disease include hypertension and dyslipidemia and there is a family history of heart disease including her father who apparently had an MI in his 17s but died later of heart disease.  Please she has been having some increase in shortness of breath.  She is also noted some worsening palpitations but is generally ignore them.  Her blood pressure has been rising and required the addition of amlodipine recently but is well controlled at home.  Initially was elevated today however came down to 120/80.  She did struggle with some anemia but labs in January showed that that has improved.  Her EKG shows sinus rhythm with poor R wave progression anteriorly and PVCs.  Denies any anginal pain.  Her symptoms are worse typically when walking up hills, cycling or trying to do physical activity.  She says is not consistent but does seem to be recurrent despite the fact that she tries to give it some time to resolve.  01/11/2020  Madison Stokes returns today for follow-up of dyspnea.  She reports marked improvement in her symptoms.  She says she is cut back on her caffeine intake.  She underwent an echo which showed no significant abnormal findings and normal LV function as well as a stress test which was normal.  There was mild dilation of the aortic root to 3.9 cm which will need to be followed up.  PMHx:  Past Medical History:  Diagnosis Date  . Anemia    with chemo  . Cancer ([place]) 04/2017   Bil Breast Ca  . Family  history of prostate cancer   . Family history of uterine cancer   . History of radiation therapy 09/25/17- 11/05/17   Left Breast/ 50 Gy in 25 fractions, right breast/ 50 Gy in 25 fractions, right SCLV/ 45 Gy in 25 fractions, right breast boost/ 10 Gy in 5 fractions.   . Hyperlipidemia   . Hypertension   . Personal history of chemotherapy   . Personal history of radiation therapy   . PONV (postoperative nausea and vomiting)    nausea only    Past Surgical History:  Procedure Laterality Date  . BREAST BIOPSY    . BREAST LUMPECTOMY Left    2019  . BREAST LUMPECTOMY WITH RADIOACTIVE SEED AND SENTINEL LYMPH NODE BIOPSY Left 08/22/2017   Procedure: LEFT BREAST LUMPECTOMY WITH RADIOACTIVE SEED AND SENTINEL LYMPH NODE BIOPSY ERAS PATHWAY;  Surgeon: [name], MD;  Location: [facility];  Service: General;  Laterality: Left;  PECTORAL BLOCK  . COLONOSCOPY WITH PROPOFOL Left 07/14/2017   Procedure: COLONOSCOPY WITH PROPOFOL;  Surgeon: Ronnette Juniper, MD;  Location: [facility];  Service: Gastroenterology;  Laterality: Left;  . DILATION AND CURETTAGE OF UTERUS    . ESOPHAGOGASTRODUODENOSCOPY N/A 07/13/2017   Procedure: ESOPHAGOGASTRODUODENOSCOPY (EGD);  Surgeon: Ronnette Juniper, MD;  Location: [facility];  Service: Gastroenterology;  Laterality: N/A;  . MASTECTOMY Right    2019  . MASTECTOMY W/ SENTINEL NODE BIOPSY Right 08/22/2017   Procedure: RIGHT MASTECTOMY  WITH SENTINEL LYMPH NODE BIOPSY ERAS PATHWAY;  Surgeon: [name], MD;  Location: [facility];  Service: General;  Laterality: Right;  PECTORAL BLOCK  . PORT-A-CATH REMOVAL Right 11/06/2017   Procedure: REMOVAL PORT-A-CATH;  Surgeon: [name], MD;  Location: [facility];  Service: General;  Laterality: Right;  . PORTACATH PLACEMENT Right 05/14/2017   Procedure: ULTRASOUND GUIDED PORT PLACEMENT;  Surgeon: [name], MD;  Location: [facility];  Service: General;  Laterality: Right;    FAMHx:  Family History    Problem Relation Age of Onset  . Cancer Mother 38       uterine; d. 27  . Prostate cancer Brother 75  . Heart disease Father   . Heart attack Father   . Diabetes Father   . Hypertension Father   . Bone cancer Maternal Uncle   . Heart attack Paternal Uncle   . Prostate cancer Maternal Grandfather   . Bone cancer Maternal Grandfather   . Bone cancer Maternal Uncle   . Bone cancer Cousin     SOCHx:   reports that she has never smoked. She has never used smokeless tobacco. She reports current alcohol use. She reports that she does not use drugs.  ALLERGIES:  No Known Allergies  ROS: Pertinent items noted in HPI and remainder of comprehensive ROS otherwise negative.  HOME MEDS: Current Outpatient Medications on File Prior to Visit  Medication Sig Dispense Refill  . amLODipine (NORVASC) 5 MG tablet Take 5 mg by mouth daily.    Marland Kitchen aspirin EC 81 MG tablet Take 81 mg by mouth daily.    Marland Kitchen atorvastatin (LIPITOR) 40 MG tablet Take 40 mg by mouth daily.    . Biotin 10 MG CAPS Take by mouth.    . Calcium Citrate-Vitamin D 500-500 MG-UNIT PACK Take 2 tablets by mouth daily.    . Cholecalciferol (VITAMIN D3 PO) Take 500 Units by mouth daily.    . irbesartan (AVAPRO) 300 MG tablet Take 300 mg by mouth daily.    Marland Kitchen letrozole (FEMARA) 2.5 MG tablet Take 1 tablet (2.5 mg total) by mouth daily. 90 tablet 3  . Multiple Vitamin (MULTIVITAMIN) tablet Take 1 tablet daily by mouth.    Marland Kitchen VITAMIN B COMPLEX-C PO Take by mouth.    . [DISCONTINUED] prochlorperazine (COMPAZINE) 10 MG tablet Take 1 tablet (10 mg total) every 6 (six) hours as needed by mouth (Nausea or vomiting). 30 tablet 1   No current facility-administered medications on file prior to visit.    LABS/IMAGING: No results found for this or any previous visit (from the past 48 hour(s)). No results found.  LIPID PANEL: No results found for: CHOL, TRIG, HDL, CHOLHDL, VLDL, LDLCALC, LDLDIRECT  WEIGHTS: Wt Readings from Last 3  Encounters:  01/11/20 125 lb (56.7 kg)  12/30/19 125 lb 4.8 oz (56.8 kg)  12/29/19 129 lb (58.5 kg)    VITALS: BP 125/72   Pulse 72   Temp (!) 96.8 F (36 C)   Ht 5\' 2"  (1.575 m)   Wt 125 lb (56.7 kg)   SpO2 95%   BMI 22.86 kg/m   EXAM: Deferred  EKG: Deferred  ASSESSMENT: 1. Progressive dyspnea on exertion -low risk Myoview no significant abnormal echo findings (12/2019) 2. 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 Hypertension 4. Dyslipidemia 5. PVCs 6. Family history of premature coronary disease  PLAN: 63.   Madison Stokes had no real identifiable cause for her dyspnea.  She did have  some mild dilation of her aortic root and this should be followed up on by CT scan.  We will order repeat CT angiogram of the aorta in 1 year to reassess her dilated aortic root.  Follow-up with me as needed.  Pixie Casino, MD, [facility], [place] Director of the Advanced Lipid Disorders &  Cardiovascular Risk Reduction Clinic Diplomate of the American Board of Clinical Lipidology Attending Cardiologist  Direct Dial: [phone]  Fax: [phone]  Website:  [email or web address] 01/11/2020, 3:31 PM

## 2020-01-29 ENCOUNTER — Other Ambulatory Visit: Payer: Medicare Other

## 2020-02-09 DIAGNOSIS — E7800 Pure hypercholesterolemia, unspecified: Secondary | ICD-10-CM | POA: Diagnosis not present

## 2020-02-09 DIAGNOSIS — I10 Essential (primary) hypertension: Secondary | ICD-10-CM | POA: Diagnosis not present

## 2020-02-09 DIAGNOSIS — D509 Iron deficiency anemia, unspecified: Secondary | ICD-10-CM | POA: Diagnosis not present

## 2020-02-09 DIAGNOSIS — C50811 Malignant neoplasm of overlapping sites of right female breast: Secondary | ICD-10-CM | POA: Diagnosis not present

## 2020-03-30 ENCOUNTER — Other Ambulatory Visit: Payer: Self-pay

## 2020-03-30 DIAGNOSIS — Z170 Estrogen receptor positive status [ER+]: Secondary | ICD-10-CM

## 2020-03-30 DIAGNOSIS — C50811 Malignant neoplasm of overlapping sites of right female breast: Secondary | ICD-10-CM

## 2020-03-30 MED ORDER — LETROZOLE 2.5 MG PO TABS: 2.5000 mg | ORAL_TABLET | Freq: Every day | ORAL | 3 refills | Status: DC

## 2020-04-26 DIAGNOSIS — R7309 Other abnormal glucose: Secondary | ICD-10-CM | POA: Diagnosis not present

## 2020-04-26 DIAGNOSIS — Z0000 Encounter for general adult medical examination without abnormal findings: Secondary | ICD-10-CM | POA: Diagnosis not present

## 2020-04-26 DIAGNOSIS — I10 Essential (primary) hypertension: Secondary | ICD-10-CM | POA: Diagnosis not present

## 2020-04-26 DIAGNOSIS — Z23 Encounter for immunization: Secondary | ICD-10-CM | POA: Diagnosis not present

## 2020-04-26 DIAGNOSIS — R3915 Urgency of urination: Secondary | ICD-10-CM | POA: Diagnosis not present

## 2020-04-26 DIAGNOSIS — R7303 Prediabetes: Secondary | ICD-10-CM | POA: Diagnosis not present

## 2020-04-26 DIAGNOSIS — I700 Atherosclerosis of aorta: Secondary | ICD-10-CM | POA: Diagnosis not present

## 2020-04-26 DIAGNOSIS — E7800 Pure hypercholesterolemia, unspecified: Secondary | ICD-10-CM | POA: Diagnosis not present

## 2020-04-26 DIAGNOSIS — C50811 Malignant neoplasm of overlapping sites of right female breast: Secondary | ICD-10-CM | POA: Diagnosis not present

## 2020-05-09 ENCOUNTER — Other Ambulatory Visit: Payer: Self-pay

## 2020-05-09 ENCOUNTER — Ambulatory Visit
Admission: RE | Admit: 2020-05-09 | Discharge: 2020-05-09 | Disposition: A | Discharge status: Home / Self Care | Payer: Medicare Other | Source: Ambulatory Visit | Attending: Hematology | Admitting: Hematology

## 2020-05-09 DIAGNOSIS — Z780 Asymptomatic menopausal state: Secondary | ICD-10-CM | POA: Diagnosis not present

## 2020-05-09 DIAGNOSIS — C50112 Malignant neoplasm of central portion of left female breast: Secondary | ICD-10-CM

## 2020-05-09 DIAGNOSIS — R922 Inconclusive mammogram: Secondary | ICD-10-CM | POA: Diagnosis not present

## 2020-05-09 DIAGNOSIS — M8588 Other specified disorders of bone density and structure, other site: Secondary | ICD-10-CM | POA: Diagnosis not present

## 2020-05-09 DIAGNOSIS — E2839 Other primary ovarian failure: Secondary | ICD-10-CM

## 2020-05-10 ENCOUNTER — Telehealth: Payer: Self-pay | Admitting: *Deleted

## 2020-05-10 NOTE — Telephone Encounter (Signed)
Called pt & informed of Dexa result per DR Burr Medico & need to cont with Ca+ & Vit D & wt bearing exercise.  She expressed understanding.

## 2020-05-10 NOTE — Telephone Encounter (Signed)
-----   Message from [name], MD sent at 05/10/2020 10:47 AM EST ----- Please let pt know her DEXA results, it has improved since two years ago, but still in osteopenia range, continue calcium vit D and weight bearing exercise. She completed 2 years zometa. Thanks   [name]  05/10/2020

## 2020-06-23 DIAGNOSIS — H35372 Puckering of macula, left eye: Secondary | ICD-10-CM | POA: Diagnosis not present

## 2020-06-23 DIAGNOSIS — Z961 Presence of intraocular lens: Secondary | ICD-10-CM | POA: Diagnosis not present

## 2020-06-23 DIAGNOSIS — H43813 Vitreous degeneration, bilateral: Secondary | ICD-10-CM | POA: Diagnosis not present

## 2020-06-23 DIAGNOSIS — H35361 Drusen (degenerative) of macula, right eye: Secondary | ICD-10-CM | POA: Diagnosis not present

## 2020-06-29 NOTE — Progress Notes (Signed)
[name]   [phone] [phone]   Clinic Follow up Note   Patient Care Team: [name], MD as PCP - General (Family Medicine) [name], [name], MD as PCP - Cardiology (Cardiology) [name], MD as Consulting Physician (Hematology) [name], NP as Nurse Practitioner (Nurse Practitioner) [name], MD as Consulting Physician (General Surgery) [name], NP as Nurse Practitioner (Hematology and Oncology)  Date of Service:  07/01/2020  CHIEF COMPLAINT: F/u of bilateral breast cancer  SUMMARY OF ONCOLOGIC HISTORY: Oncology History Overview Note  Cancer Staging Cancer of central portion of left breast [facility]) Staging form: Breast, AJCC 8th Edition - Clinical stage from 05/03/2017: Stage IA (cT1a, cN0, cM0, G1, ER: Positive, PR: Positive, HER2: Negative) - Signed by [name], MD on 05/07/2017 - Pathologic stage from 08/22/2017: No Stage Recommended (ypT0, pN0, cM0, GX, ER: Unknown, PR: Unknown, HER2: Unknown) - Signed by [name], MD on 11/06/2017  Cancer of overlapping sites of right female breast [facility]) Staging form: Breast, AJCC 8th Edition - Clinical stage from 04/24/2017: Stage IIA (cT3, cN0, cM0, G2, ER+, PR+, HER2-) - Signed by [name], MD on 11/06/2017 - Pathologic stage from 08/22/2017: No Stage Recommended (ypT3, pN0(i+), cM0, G2, ER+, PR+, HER2-) - Signed by [name], MD on 10/29/2017     Cancer of overlapping sites of right female breast ([place])  03/08/2017 Mammogram   IMPRESSION: 1. Patient has a is palpable mass in the lateral right breast, in the location dense fibroglandular breast tissue. Although no discrete mass is seen sonographically or mammographically, the Clinical change remains concerning. Biopsy is recommended.  RECOMMENDATION: 1. Ultrasound-guided core needle biopsy of palpable abnormality in the lateral right breast.   04/24/2017 Breast US   IMPRESSION: Ultrasound guided biopsy of the  right breast. No apparent complications.   04/24/2017 Initial Biopsy   Breast, right, needle core biopsy, UOQ, centered at 9:30 o'clock - INVASIVE MAMMARY CARCINOMA. - SEE COMMENT. ER 100% positive PR 100% positive Ki67 10% HER2 FISH positive (ratio 2.10, copy number 3.30)  HER2 IHC was done on the biopsy sample after surgery, and was negative (1+). So the final HER2 is NEGATIVE (determined after surgery)   05/02/2017 Breast MRI   IMPRESSION: 1. 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 2. 4 mm enhancing mass with associated architectural distortion in the central left breast. This is suspicious for additional focus of carcinoma.   05/07/2017 Initial Diagnosis   Cancer of overlapping sites of right female breast ([place])   05/15/2017 Echocardiogram   ECHO 05/15/17  Impressions: - LVEF 60-65%, normal wall thickness, normal wall motion and GLS   strain, grade 1 DD, normal LV filling pressure, mild MR, normal   LA size, normal IVC.      05/22/2017 Imaging   CT CAP W Contrast 05/22/17 IMPRESSION: 1. No specific CT findings of nodal or other metastatic disease in the chest, abdomen, and pelvis. 2. Bosniak category 2F cyst in the right mid kidney, with mild enhancement along a septation but without nodularity. Unless follow up abdominal scans related to the patient's breast cancer adequately characterize this lesion, renal protocol MRI or CT scan would be recommended in 6 months time. 3. Other imaging findings of potential clinical significance: The Aortic Atherosclerosis (ICD10-I70.0). Ectatic ascending thoracic aorta without aneurysm. Small type 1 hiatal hernia. Several small hypodense liver lesions are likely benign although technically too small to  characterize. Nonobstructive 1.2 cm right renal calculus. Anterior uterine fundal myometrial mass favoring fibroid. Pelvic floor laxity with small  cystocele. Notable spondylosis at L3-4 and L5-S1.    05/22/2017 Imaging   Bone Scan Whole Body 05/22/17 IMPRESSION: 1. No findings of osseous metastatic disease. 2. Lower lumbar activity corresponds to areas of significant benign spondylosis on the CT scan.   05/24/2017 - 09/05/2017 Chemotherapy   TCHP every 3 weeks for 6 cycles starting 05/24/17 followed by a year of maintenance Herceptin and Perjeta. Given lack of response she stopped TCHP after 4 cycles on 07/26/17.  She will proceeded with Maintenance Herceptin and Perjeta on 08/16/17 and 09/05/17 only due to hesurgical smaple being HER2 negative     07/12/2017 - 07/14/2017 Hospital Admission   Admit date: 07/12/17 Admission diagnosis: GI Hemorrhaging  Additional comments: Colonoscopy and EGD done with no evidence of bleeding site. Given blood transfusion on 07/15/17   08/02/2017 Imaging   MRI Breast Bilateral 2/16//18 IMPRESSION: 1. The known lobular carcinoma of the right breast has not significantly changed since the prior MRI from 05/02/2017. The disease spans approximately 5.6 cm, previously 5.9 cm in the anterior to posterior dimension. 2. 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 3.  No new suspicious findings in either breast. 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   08/16/2017 -  Anti-estrogen oral therapy   Letrozole once daily starting 08/16/17    08/22/2017 Surgery    RIGHT MASTECTOMY WITH SENTINEL LYMPH NODE BIOPSY ERAS PATHWAY by Dr. Georgette Dover   08/22/2017 Pathology Results   See the oncology history under left breast cancer   09/25/2017 - 11/05/2017 Radiation Therapy   She has completed bilateral adjuvant radiation 09/25/17-11/05/17 managed by Dr. Isidore Moos. She tolerated well.   12/17/2017 Imaging   CT AP W  Contrast 12/17/17 IMPRESSION: 1. Stable bilateral renal cysts. No change since prior examination and no worrisome CT imaging features. Stable lower pole right renal cyst. 2. No acute abdominal/pelvic findings or lymphadenopathy. 3. Stable small hepatic cysts. 4. Stable uterine fibroids.   01/22/2018 PET scan   PET 01/22/18 IMPRESSION: 1. Status post right mastectomy and left breast lumpectomy. 2. Minimal left axillary scarring changes near a surgical clip. No findings suspicious for chest wall tumor or axillary or supraclavicular adenopathy. 3. No findings for metastatic disease involving the lungs, abdomen/pelvis or osseous structures   03/21/2018 - 12/30/2019 Chemotherapy   Zometa injections for osteopenia every 6 months for 2 years 03/21/18-12/30/19   03/21/2018 - 12/30/2019 Anti-estrogen oral therapy   -Zometa injections every 6 months for 2 years 03/21/18-12/30/19   Cancer of central portion of left breast ([place])  05/02/2017 Breast MRI   IMPRESSION: 1. 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 2. 4 mm enhancing mass with associated architectural distortion in the central left breast. This is suspicious for additional focus of carcinoma.   05/03/2017 Mammogram   IMPRESSION: 1. Suspicious area of architectural distortion in the left breast which corresponds to a small enhancing mass on MRI.  RECOMMENDATION: Stereotactic core needle biopsy of the area of architectural distortion in the left breast.    05/03/2017 Breast US   Stereotactic core needle biopsy of the area of architectural distortion in the left breast.  05/03/2017 Initial Biopsy   Breast, left, needle core biopsy, central, slightly toward the lower, outer quadrant - INVASIVE DUCTAL CARCINOMA, MSBR GRADE 1/2. - SEE MICROSCOPIC DESCRIPTION. ER 50% positive PR 90% positive Ki67: 1% HER2 negative    05/07/2017 Initial Diagnosis   Cancer of  central portion of left breast ([place])   08/22/2017 Surgery   LEFT BREAST LUMPECTOMY WITH RADIOACTIVE SEED AND SENTINEL LYMPH NODE BIOPSY ERAS PATHWAY by Dr. Georgette Dover 08/22/17    08/22/2017 Pathology Results   Diagnosis 08/22/17 1. Breast, lumpectomy, Left - ATYPICAL DUCTAL HYPERPLASIA - FIBROCYSTIC CHANGES - PREVIOUS BIOPSY SITE CHANGES - SEE COMMENT 2. Lymph node, sentinel, biopsy, a total of 4 nodes, all negative for malignant cells  6. Breast, simple mastectomy, Right - INVASIVE LOBULAR CARCINOMA, [place] GRADE 2/3, 6.0 CM - LOBULAR NEOPLASIA (ATYPICAL LOBULAR HYPERPLASIA) - PREVIOUS BIOPSY SITE CHANGES 7. 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  The Hereditary Gene Panel offered by [facility] includes sequencing and/or deletion duplication testing of the following 47 genes: APC, ATM, AXIN2, BARD1, BMPR1A, BRCA1, BRCA2, BRIP1, CDH1, CDK4, CDKN2A (p14ARF), CDKN2A (p16INK4a), CHEK2, CTNNA1, DICER1, EPCAM (Deletion/duplication testing only), GREM1 (promoter region deletion/duplication testing only), KIT, MEN1, MLH1, MSH2, MSH3, MSH6, MUTYH, NBN, NF1, NHTL1, PALB2, PDGFRA, PMS2, POLD1, POLE, PTEN, RAD50, RAD51C, RAD51D, SDHB, SDHC, SDHD, SMAD4, SMARCA4. STK11, TP53, TSC1, TSC2, and VHL.  The following genes were evaluated for sequence changes only: SDHA and HOXB13 c.251G>A variant only. The report date is February 15, 2018.      CURRENT THERAPY:  adjuvant Letrozole once daily started 08/16/17   INTERVAL HISTORY:  Madison Stokes is here for a follow up of b/l breast cancer. She was last seen by me 6 months ago. She presents to the clinic alone. She notes she is doing well. She was previously having SOB with change in diet temporarily and seen by Cardiologist. She notes her breaching is now back to normal. She denies any  complaints or any new changes. I reviewed her medication list with her. She notes she had mild changes in her HTN and cholesterol medication in the past year. She notes she does not drink alcohol.     REVIEW OF SYSTEMS:   Constitutional: Denies fevers, chills or abnormal weight loss Eyes: Denies blurriness of vision Ears, nose, mouth, throat, and face: Denies mucositis or sore throat Respiratory: Denies cough, dyspnea or wheezes Cardiovascular: Denies palpitation, chest discomfort or lower extremity swelling Gastrointestinal:  Denies nausea, heartburn or change in bowel habits Skin: Denies abnormal skin rashes Lymphatics: Denies new lymphadenopathy or easy bruising Neurological:Denies numbness, tingling or new weaknesses Behavioral/Psych: Mood is stable, no new changes  All other systems were reviewed with the patient and are negative.  MEDICAL HISTORY:  Past Medical History:  Diagnosis Date  . Anemia    with chemo  . Cancer ([place]) 04/2017   Bil Breast Ca  . Family history of prostate cancer   . Family history of uterine cancer   . History of radiation therapy 09/25/17- 11/05/17   Left Breast/ 50 Gy in 25 fractions, right breast/ 50 Gy in 25 fractions, right SCLV/ 45 Gy in 25 fractions, right breast boost/ 10 Gy in 5 fractions.   . Hyperlipidemia   . Hypertension   . Personal history of chemotherapy   . Personal history  of radiation therapy   . PONV (postoperative nausea and vomiting)    nausea only    SURGICAL HISTORY: Past Surgical History:  Procedure Laterality Date  . BREAST BIOPSY    . BREAST LUMPECTOMY Left    2019  . BREAST LUMPECTOMY WITH RADIOACTIVE SEED AND SENTINEL LYMPH NODE BIOPSY Left 08/22/2017   Procedure: LEFT BREAST LUMPECTOMY WITH RADIOACTIVE SEED AND SENTINEL LYMPH NODE BIOPSY ERAS PATHWAY;  Surgeon: [name], MD;  Location: [place];  Service: General;  Laterality: Left;  PECTORAL BLOCK  . COLONOSCOPY WITH PROPOFOL Left 07/14/2017   Procedure:  COLONOSCOPY WITH PROPOFOL;  Surgeon: Ronnette Juniper, MD;  Location: [facility];  Service: Gastroenterology;  Laterality: Left;  . DILATION AND CURETTAGE OF UTERUS    . ESOPHAGOGASTRODUODENOSCOPY N/A 07/13/2017   Procedure: ESOPHAGOGASTRODUODENOSCOPY (EGD);  Surgeon: Ronnette Juniper, MD;  Location: [facility];  Service: Gastroenterology;  Laterality: N/A;  . MASTECTOMY Right    2019  . MASTECTOMY W/ SENTINEL NODE BIOPSY Right 08/22/2017   Procedure: RIGHT MASTECTOMY WITH SENTINEL LYMPH NODE BIOPSY ERAS PATHWAY;  Surgeon: [name], MD;  Location: [place];  Service: General;  Laterality: Right;  PECTORAL BLOCK  . PORT-A-CATH REMOVAL Right 11/06/2017   Procedure: REMOVAL PORT-A-CATH;  Surgeon: [name], MD;  Location: [place];  Service: General;  Laterality: Right;  . PORTACATH PLACEMENT Right 05/14/2017   Procedure: ULTRASOUND GUIDED PORT PLACEMENT;  Surgeon: [name], MD;  Location: [place];  Service: General;  Laterality: Right;    I have reviewed the social history and family history with the patient and they are unchanged from previous note.  ALLERGIES:  has No Known Allergies.  MEDICATIONS:  Current Outpatient Medications  Medication Sig Dispense Refill  . amLODipine (NORVASC) 5 MG tablet Take 5 mg by mouth daily.    Marland Kitchen aspirin EC 81 MG tablet Take 81 mg by mouth daily.    Marland Kitchen atorvastatin (LIPITOR) 40 MG tablet Take 40 mg by mouth daily.    . Biotin 10 MG CAPS Take by mouth.    . Calcium Citrate-Vitamin D 500-500 MG-UNIT PACK Take 2 tablets by mouth daily.    . Cholecalciferol (VITAMIN D3 PO) Take 500 Units by mouth daily.    . irbesartan (AVAPRO) 300 MG tablet Take 300 mg by mouth daily.    Marland Kitchen letrozole (FEMARA) 2.5 MG tablet Take 1 tablet (2.5 mg total) by mouth daily. 90 tablet 3  . Multiple Vitamin (MULTIVITAMIN) tablet Take 1 tablet daily by mouth.    Marland Kitchen VITAMIN B COMPLEX-C PO Take by mouth.     No current facility-administered  medications for this visit.    PHYSICAL EXAMINATION: ECOG PERFORMANCE STATUS: 0 - Asymptomatic  Vitals:   07/01/20 0949  BP: 132/76  Pulse: 74  Resp: 17  Temp: 98.3 F (36.8 C)  SpO2: 98%   Filed Weights   07/01/20 0949  Weight: 128 lb 8 oz (58.3 kg)    GENERAL:alert, no distress and comfortable SKIN: skin color, texture, turgor are normal, no rashes or significant lesions EYES: normal, Conjunctiva are pink and non-injected, sclera clear  NECK: supple, thyroid normal size, non-tender, without nodularity LYMPH:  no palpable lymphadenopathy in the cervical, axillary  LUNGS: clear to auscultation and percussion with normal breathing effort HEART: regular rate & rhythm and no murmurs and no lower extremity edema ABDOMEN:abdomen soft, non-tender and normal bowel sounds. No hepatomegaly (+) Minimal abdominal tenderness Musculoskeletal:no cyanosis of digits and no clubbing  NEURO: alert &  oriented x 3 with fluent speech, no focal motor/sensory deficits BREAST: S/p right mastectomy and left lumpectomy: Surgical incision healed well. No palpable mass, nodules or adenopathy bilaterally. Breast exam benign.   LABORATORY DATA:  I have reviewed the data as listed CBC Latest Ref Rng & Units 07/01/2020 12/30/2019 06/29/2019  WBC 4.0 - 10.5 K/uL 5.9 4.7 4.2  Hemoglobin 12.0 - 15.0 g/dL 13.8 14.7 15.5(H)  Hematocrit 36.0 - 46.0 % 41.8 43.9 45.6  Platelets 150 - 400 K/uL 199 212 195     CMP Latest Ref Rng & Units 07/01/2020 12/30/2019 06/29/2019  Glucose 70 - 99 mg/dL 104(H) 92 95  BUN 8 - 23 mg/dL _0 Creatinine 0.44 - 1.00 mg/dL 0.89 0.86 0.84  Sodium 135 - 145 mmol/L 142 143 144  Potassium 3.5 - 5.1 mmol/L 3.8 4.3 3.8  Chloride 98 - 111 mmol/L 107 108 106  CO2 22 - 32 mmol/L _1 Calcium 8.9 - 10.3 mg/dL 9.7 9.9 9.5  Total Protein 6.5 - 8.1 g/dL 7.3 7.4 7.5  Total Bilirubin 0.3 - 1.2 mg/dL 0.7 0.7 0.6  Alkaline Phos 38 - 126 U/L 205(H) 79 96  AST 15 - 41 U/L 90(H) 32 28  ALT 0  - 44 U/L 142(H) 37 31      RADIOGRAPHIC STUDIES: I have personally reviewed the radiological images as listed and agreed with the findings in the report. No results found.   ASSESSMENT & PLAN:  KYRSTAN GOTWALT is a 75 y.o. female with   1. 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 She underwent neoadjuvant TCHP and maintenance Herceptin/Perjeta, s/p right mastectomy, left breast lumpectomy and B/l adjuvant Radiation. -She started anti-estrogen therapy with Letrozole in 07/2017. She is tolerating well. -She is clinically doing well. Lab reviewed, her CBC and CMP are within normal limits except BG 104, AST 90, ALT 142, Alk Phos 205. Her physical exam and her 04/2020 mammogram were unremarkable. There is no high clinical concern for recurrence. Will get [place] of abdomen for her abnormal liver functions  -Continue surveillance. Next left mammogram in 04/2021 -Continue Letrozole.  -F/u in 3 months   2. Geneticwas negative for pathogenetic mutations.  3. Bosniak category 52F cyst in the right mid kidney, 1.2cm Non-obstructive kidney stone -found on 05/22/17 CT Scan  -Her 12/17/17 CT AP showed stable renal and hepatic cysts.No further f/u scan is recommended by radiology  4.Osteopenia -Her 01/2018 DEXA showed osteopenia with the lowest T-Score at AP Spine at -2. -She completed 2 years of Zometa9/27/19-12/30/19.  -Continue Calcium and VitD supplement daily. -Her 04/2020 DEXA showed improved osteopenia with lowest T-score -1.7 at right hip. Will continue to monitor. I encouraged her to increase weight bearing  exercise.   5. HTN -On medications. Continue to f/u with PCP  6. Transmanitis  -Her 07/01/20 lab today shows AST 90, ALT 142, Alk hs 205, normal tbili. No hepatomegaly on exam today -She does not drink alcohol and I advised her not to take much Tylenol. She denies recent changes in her medications.  -Will obtain US Liver next week for further evaluation. Will repeat labs next  months. If [place] abnormal will obtain CT scan. She is agreeable   PLAN: -US Liver next week  -Lab in 1 month  -Lab and F/u in 3 mnths -Continue Letrozole daily    No problem-specific Assessment & Plan notes found for this encounter.   No orders of the defined types were placed in this encounter.  All questions were answered. The patient knows to call the clinic with any problems, questions or concerns. No barriers to learning was detected. The total time spent in the appointment was 30 minutes.     [name], MD 07/01/2020   I, Joslyn Devon, am acting as scribe for [name], MD.   I have reviewed the above documentation for accuracy and completeness, and I agree with the above.

## 2020-07-01 ENCOUNTER — Other Ambulatory Visit: Payer: Self-pay

## 2020-07-01 ENCOUNTER — Telehealth: Payer: Self-pay | Admitting: Hematology

## 2020-07-01 ENCOUNTER — Inpatient Hospital Stay: Payer: Medicare Other | Attending: Hematology | Admitting: Hematology

## 2020-07-01 ENCOUNTER — Encounter: Payer: Self-pay | Admitting: Hematology

## 2020-07-01 ENCOUNTER — Inpatient Hospital Stay: Payer: Medicare Other

## 2020-07-01 VITALS — BP 132/76 | HR 74 | Temp 98.3°F | Resp 17 | Ht 62.0 in | Wt 128.5 lb

## 2020-07-01 DIAGNOSIS — I10 Essential (primary) hypertension: Secondary | ICD-10-CM | POA: Insufficient documentation

## 2020-07-01 DIAGNOSIS — C50112 Malignant neoplasm of central portion of left female breast: Secondary | ICD-10-CM | POA: Diagnosis not present

## 2020-07-01 DIAGNOSIS — Z170 Estrogen receptor positive status [ER+]: Secondary | ICD-10-CM | POA: Insufficient documentation

## 2020-07-01 DIAGNOSIS — M85851 Other specified disorders of bone density and structure, right thigh: Secondary | ICD-10-CM | POA: Insufficient documentation

## 2020-07-01 DIAGNOSIS — C50811 Malignant neoplasm of overlapping sites of right female breast: Secondary | ICD-10-CM | POA: Diagnosis not present

## 2020-07-01 LAB — CMP (CANCER CENTER ONLY)
ALT: 142 U/L — ABNORMAL HIGH (ref 0–44)
AST: 90 U/L — ABNORMAL HIGH (ref 15–41)
Albumin: 4 g/dL (ref 3.5–5.0)
Alkaline Phosphatase: 205 U/L — ABNORMAL HIGH (ref 38–126)
Anion gap: 8 (ref 5–15)
BUN: 18 mg/dL (ref 8–23)
CO2: 27 mmol/L (ref 22–32)
Calcium: 9.7 mg/dL (ref 8.9–10.3)
Chloride: 107 mmol/L (ref 98–111)
Creatinine: 0.89 mg/dL (ref 0.44–1.00)
GFR, Estimated: >60 mL/min (ref >60)
Glucose, Bld: 104 mg/dL — ABNORMAL HIGH (ref 70–99)
Potassium: 3.8 mmol/L (ref 3.5–5.1)
Sodium: 142 mmol/L (ref 135–145)
Total Bilirubin: 0.7 mg/dL (ref 0.3–1.2)
Total Protein: 7.3 g/dL (ref 6.5–8.1)

## 2020-07-01 LAB — CBC WITH DIFFERENTIAL (CANCER CENTER ONLY)
Abs Immature Granulocytes: 0.03 10*3/uL (ref 0.00–0.07)
Basophils Absolute: 0.1 10*3/uL (ref 0.0–0.1)
Basophils Relative: 1 %
Eosinophils Absolute: 0.1 10*3/uL (ref 0.0–0.5)
Eosinophils Relative: 2 %
HCT: 41.8 % (ref 36.0–46.0)
Hemoglobin: 13.8 g/dL (ref 12.0–15.0)
Immature Granulocytes: 1 %
Lymphocytes Relative: 18 %
Lymphs Abs: 1.1 10*3/uL (ref 0.7–4.0)
MCH: 31.6 pg (ref 26.0–34.0)
MCHC: 33 g/dL (ref 30.0–36.0)
MCV: 95.7 fL (ref 80.0–100.0)
Monocytes Absolute: 0.7 10*3/uL (ref 0.1–1.0)
Monocytes Relative: 11 %
Neutro Abs: 4 10*3/uL (ref 1.7–7.7)
Neutrophils Relative %: 67 %
Platelet Count: 199 10*3/uL (ref 150–400)
RBC: 4.37 MIL/uL (ref 3.87–5.11)
RDW: 13.6 % (ref 11.5–15.5)
WBC Count: 5.9 10*3/uL (ref 4.0–10.5)
nRBC: 0 % (ref 0.0–0.2)

## 2020-07-01 NOTE — Telephone Encounter (Signed)
Scheduled appointments per 1/7 los. Spoke to patient who is aware of appointments dates and times.  

## 2020-07-02 LAB — CANCER ANTIGEN 27.29: CA 27.29: 65.9 U/mL — ABNORMAL HIGH (ref 0.0–38.6)

## 2020-07-05 ENCOUNTER — Telehealth: Payer: Self-pay

## 2020-07-05 NOTE — Telephone Encounter (Signed)
-----   Message from [name], MD sent at 07/04/2020  7:34 AM EST ----- Please let pt know that her tumor marker was elevated last week, but lower than before, no concerns, thanks  [name]  07/04/2020

## 2020-07-05 NOTE — Telephone Encounter (Signed)
Spoke with patient made her aware of most recent lab results

## 2020-07-13 DIAGNOSIS — D509 Iron deficiency anemia, unspecified: Secondary | ICD-10-CM | POA: Diagnosis not present

## 2020-07-13 DIAGNOSIS — C50811 Malignant neoplasm of overlapping sites of right female breast: Secondary | ICD-10-CM | POA: Diagnosis not present

## 2020-07-13 DIAGNOSIS — I10 Essential (primary) hypertension: Secondary | ICD-10-CM | POA: Diagnosis not present

## 2020-07-13 DIAGNOSIS — E7800 Pure hypercholesterolemia, unspecified: Secondary | ICD-10-CM | POA: Diagnosis not present

## 2020-07-13 DIAGNOSIS — E119 Type 2 diabetes mellitus without complications: Secondary | ICD-10-CM | POA: Diagnosis not present

## 2020-07-26 ENCOUNTER — Inpatient Hospital Stay: Payer: Medicare Other | Attending: Hematology

## 2020-07-26 ENCOUNTER — Other Ambulatory Visit: Payer: Self-pay

## 2020-07-26 DIAGNOSIS — C50811 Malignant neoplasm of overlapping sites of right female breast: Secondary | ICD-10-CM | POA: Insufficient documentation

## 2020-07-26 DIAGNOSIS — C50112 Malignant neoplasm of central portion of left female breast: Secondary | ICD-10-CM | POA: Insufficient documentation

## 2020-07-26 LAB — CMP (CANCER CENTER ONLY)
ALT: 191 U/L — ABNORMAL HIGH (ref 0–44)
AST: 115 U/L — ABNORMAL HIGH (ref 15–41)
Albumin: 4.3 g/dL (ref 3.5–5.0)
Alkaline Phosphatase: 263 U/L — ABNORMAL HIGH (ref 38–126)
Anion gap: 9 (ref 5–15)
BUN: 21 mg/dL (ref 8–23)
CO2: 27 mmol/L (ref 22–32)
Calcium: 9.8 mg/dL (ref 8.9–10.3)
Chloride: 107 mmol/L (ref 98–111)
Creatinine: 0.86 mg/dL (ref 0.44–1.00)
GFR, Estimated: >60 mL/min (ref >60)
Glucose, Bld: 74 mg/dL (ref 70–99)
Potassium: 4.1 mmol/L (ref 3.5–5.1)
Sodium: 143 mmol/L (ref 135–145)
Total Bilirubin: 0.6 mg/dL (ref 0.3–1.2)
Total Protein: 7.6 g/dL (ref 6.5–8.1)

## 2020-07-26 LAB — CBC WITH DIFFERENTIAL (CANCER CENTER ONLY)
Abs Immature Granulocytes: 0.03 10*3/uL (ref 0.00–0.07)
Basophils Absolute: 0.1 10*3/uL (ref 0.0–0.1)
Basophils Relative: 1 %
Eosinophils Absolute: 0.1 10*3/uL (ref 0.0–0.5)
Eosinophils Relative: 2 %
HCT: 44.1 % (ref 36.0–46.0)
Hemoglobin: 14.3 g/dL (ref 12.0–15.0)
Immature Granulocytes: 1 %
Lymphocytes Relative: 24 %
Lymphs Abs: 1.3 10*3/uL (ref 0.7–4.0)
MCH: 31.1 pg (ref 26.0–34.0)
MCHC: 32.4 g/dL (ref 30.0–36.0)
MCV: 95.9 fL (ref 80.0–100.0)
Monocytes Absolute: 0.7 10*3/uL (ref 0.1–1.0)
Monocytes Relative: 14 %
Neutro Abs: 3.2 10*3/uL (ref 1.7–7.7)
Neutrophils Relative %: 58 %
Platelet Count: 261 10*3/uL (ref 150–400)
RBC: 4.6 MIL/uL (ref 3.87–5.11)
RDW: 14 % (ref 11.5–15.5)
WBC Count: 5.3 10*3/uL (ref 4.0–10.5)
nRBC: 0 % (ref 0.0–0.2)

## 2020-07-27 LAB — CANCER ANTIGEN 27.29: CA 27.29: 73.3 U/mL — ABNORMAL HIGH (ref 0.0–38.6)

## 2020-07-29 ENCOUNTER — Encounter: Payer: Self-pay | Admitting: Hematology

## 2020-08-02 ENCOUNTER — Other Ambulatory Visit: Payer: Self-pay | Admitting: Hematology

## 2020-08-02 ENCOUNTER — Other Ambulatory Visit: Payer: Self-pay

## 2020-08-02 ENCOUNTER — Telehealth: Payer: Self-pay

## 2020-08-02 DIAGNOSIS — R7989 Other specified abnormal findings of blood chemistry: Secondary | ICD-10-CM

## 2020-08-02 DIAGNOSIS — C50112 Malignant neoplasm of central portion of left female breast: Secondary | ICD-10-CM

## 2020-08-02 NOTE — Telephone Encounter (Signed)
I spoke with Madison Stokes regarding the appt for abd u/s and instructions.  She verbalized understanding.

## 2020-08-04 ENCOUNTER — Ambulatory Visit (HOSPITAL_COMMUNITY): Payer: Medicare Other

## 2020-08-08 NOTE — Progress Notes (Signed)
[place]   Telephone:([phone]) [phone] Fax:(336) [phone]   Clinic Follow up Note   Patient Care Team: [name], MD as PCP - General (Family Medicine) [name], [name], MD as PCP - Cardiology (Cardiology) [name], MD as Consulting Physician (Hematology) [name], NP as Nurse Practitioner (Nurse Practitioner) [name], MD as Consulting Physician (General Surgery) [name], NP as Nurse Practitioner (Hematology and Oncology)   I connected with Madison Stokes on 08/11/2020 at  2:40 PM EST by telephone visit and verified that I am speaking with the correct person using two identifiers.  I discussed the limitations, risks, security and privacy concerns of performing an evaluation and management service by telephone and the availability of in person appointments. I also discussed with the patient that there may be a patient responsible charge related to this service. The patient expressed understanding and agreed to proceed.   Other persons participating in the visit and their role in the encounter:  Pt's Husband   Patient's location:  Home  Provider's location:  Office   CHIEF COMPLAINT: F/u of bilateral breast cancer  SUMMARY OF ONCOLOGIC HISTORY: Oncology History Overview Note  Cancer Staging Cancer of central portion of left breast ([place]) Staging form: Breast, AJCC 8th Edition - Clinical stage from 05/03/2017: Stage IA (cT1a, cN0, cM0, G1, ER: Positive, PR: Positive, HER2: Negative) - Signed by [name], MD on 05/07/2017 - Pathologic stage from 08/22/2017: No Stage Recommended (ypT0, pN0, cM0, GX, ER: Unknown, PR: Unknown, HER2: Unknown) - Signed by [name], MD on 11/06/2017  Cancer of overlapping sites of right female breast [facility]) Staging form: Breast, AJCC 8th Edition - Clinical stage from 04/24/2017: Stage IIA (cT3, cN0, cM0, G2, ER+, PR+, HER2-) - Signed by [name], MD on 11/06/2017 - Pathologic stage from 08/22/2017: No Stage  Recommended (ypT3, pN0(i+), cM0, G2, ER+, PR+, HER2-) - Signed by [name], MD on 10/29/2017     Cancer of overlapping sites of right female breast ([place])  03/08/2017 Mammogram   IMPRESSION: 1. Patient has a is palpable mass in the lateral right breast, in the location dense fibroglandular breast tissue. Although no discrete mass is seen sonographically or mammographically, the Clinical change remains concerning. Biopsy is recommended.  RECOMMENDATION: 1. Ultrasound-guided core needle biopsy of palpable abnormality in the lateral right breast.   04/24/2017 Breast US   IMPRESSION: Ultrasound guided biopsy of the right breast. No apparent complications.   04/24/2017 Initial Biopsy   Breast, right, needle core biopsy, UOQ, centered at 9:30 o'clock - INVASIVE MAMMARY CARCINOMA. - SEE COMMENT. ER 100% positive PR 100% positive Ki67 10% HER2 FISH positive (ratio 2.10, copy number 3.30)  HER2 IHC was done on the biopsy sample after surgery, and was negative (1+). So the final HER2 is NEGATIVE (determined after surgery)   05/02/2017 Breast MRI   IMPRESSION: 1. 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 2. 4 mm enhancing mass with associated architectural distortion in the central left breast. This is suspicious for additional focus of carcinoma.   05/07/2017 Initial Diagnosis   Cancer of overlapping sites of right female breast ([place])   05/15/2017 Echocardiogram   ECHO 05/15/17  Impressions: - LVEF 60-65%, normal wall thickness, normal wall motion and GLS   strain, grade 1 DD, normal LV filling pressure, mild MR, normal   LA size, normal IVC.      05/22/2017 Imaging  CT CAP W Contrast 05/22/17 IMPRESSION: 1. No specific CT findings of nodal or other metastatic disease in the chest, abdomen, and pelvis. 2. Bosniak category 61F cyst in the right mid kidney, with mild enhancement along a septation  but without nodularity. Unless follow up abdominal scans related to the patient's breast cancer adequately characterize this lesion, renal protocol MRI or CT scan would be recommended in 6 months time. 3. Other imaging findings of potential clinical significance: The Aortic Atherosclerosis (ICD10-I70.0). Ectatic ascending thoracic aorta without aneurysm. Small type 1 hiatal hernia. Several small hypodense liver lesions are likely benign although technically too small to characterize. Nonobstructive 1.2 cm right renal calculus. Anterior uterine fundal myometrial mass favoring fibroid. Pelvic floor laxity with small cystocele. Notable spondylosis at L3-4 and L5-S1.    05/22/2017 Imaging   Bone Scan Whole Body 05/22/17 IMPRESSION: 1. No findings of osseous metastatic disease. 2. Lower lumbar activity corresponds to areas of significant benign spondylosis on the CT scan.   05/24/2017 - 09/05/2017 Chemotherapy   TCHP every 3 weeks for 6 cycles starting 05/24/17 followed by a year of maintenance Herceptin and Perjeta. Given lack of response she stopped TCHP after 4 cycles on 07/26/17.  She will proceeded with Maintenance Herceptin and Perjeta on 08/16/17 and 09/05/17 only due to hesurgical smaple being HER2 negative     07/12/2017 - 07/14/2017 Hospital Admission   Admit date: 07/12/17 Admission diagnosis: GI Hemorrhaging  Additional comments: Colonoscopy and EGD done with no evidence of bleeding site. Given blood transfusion on 07/15/17   08/02/2017 Imaging   MRI Breast Bilateral 2/16//18 IMPRESSION: 1. The known lobular carcinoma of the right breast has not significantly changed since the prior MRI from 05/02/2017. The disease spans approximately 5.6 cm, previously 5.9 cm in the anterior to posterior dimension. 2. 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 3.  No new suspicious  findings in either breast. 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   08/16/2017 -  Anti-estrogen oral therapy   Letrozole once daily starting 08/16/17    08/22/2017 Surgery    RIGHT MASTECTOMY WITH SENTINEL LYMPH NODE BIOPSY ERAS PATHWAY by Dr. Georgette Dover   08/22/2017 Pathology Results   See the oncology history under left breast cancer   09/25/2017 - 11/05/2017 Radiation Therapy   She has completed bilateral adjuvant radiation 09/25/17-11/05/17 managed by Dr. Isidore Moos. She tolerated well.   12/17/2017 Imaging   CT AP W Contrast 12/17/17 IMPRESSION: 1. Stable bilateral renal cysts. No change since prior examination and no worrisome CT imaging features. Stable lower pole right renal cyst. 2. No acute abdominal/pelvic findings or lymphadenopathy. 3. Stable small hepatic cysts. 4. Stable uterine fibroids.   01/22/2018 PET scan   PET 01/22/18 IMPRESSION: 1. Status post right mastectomy and left breast lumpectomy. 2. Minimal left axillary scarring changes near a surgical clip. No findings suspicious for chest wall tumor or axillary or supraclavicular adenopathy. 3. No findings for metastatic disease involving the lungs, abdomen/pelvis or osseous structures   03/21/2018 - 12/30/2019 Chemotherapy   Zometa injections for osteopenia every 6 months for 2 years 03/21/18-12/30/19   03/21/2018 - 12/30/2019 Anti-estrogen oral therapy   -Zometa injections every 6 months for 2 years 03/21/18-12/30/19   Cancer of central portion of left breast ([place])  05/02/2017 Breast MRI   IMPRESSION: 1. Known right  breast lobular carcinoma spanning 5.9 x 3.2 x 5.6 cm, involving the upper outer and lower outer quadrants. 2. 4 mm enhancing mass with associated architectural distortion in the central left breast. This is suspicious for additional focus of carcinoma.   05/03/2017 Mammogram   IMPRESSION: 1. Suspicious area of  architectural distortion in the left breast which corresponds to a small enhancing mass on MRI.  RECOMMENDATION: Stereotactic core needle biopsy of the area of architectural distortion in the left breast.    05/03/2017 Breast US   Stereotactic core needle biopsy of the area of architectural distortion in the left breast.   05/03/2017 Initial Biopsy   Breast, left, needle core biopsy, central, slightly toward the lower, outer quadrant - INVASIVE DUCTAL CARCINOMA, MSBR GRADE 1/2. - SEE MICROSCOPIC DESCRIPTION. ER 50% positive PR 90% positive Ki67: 1% HER2 negative    05/07/2017 Initial Diagnosis   Cancer of central portion of left breast ([place])   08/22/2017 Surgery   LEFT BREAST LUMPECTOMY WITH RADIOACTIVE SEED AND SENTINEL LYMPH NODE BIOPSY ERAS PATHWAY by Dr. Georgette Dover 08/22/17    08/22/2017 Pathology Results   Diagnosis 08/22/17 1. Breast, lumpectomy, Left - ATYPICAL DUCTAL HYPERPLASIA - FIBROCYSTIC CHANGES - PREVIOUS BIOPSY SITE CHANGES - SEE COMMENT 2. Lymph node, sentinel, biopsy, a total of 4 nodes, all negative for malignant cells  6. Breast, simple mastectomy, Right - INVASIVE LOBULAR CARCINOMA, [place] GRADE 2/3, 6.0 CM - LOBULAR NEOPLASIA (ATYPICAL LOBULAR HYPERPLASIA) - PREVIOUS BIOPSY SITE CHANGES 7. 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  The Hereditary Gene Panel offered by [facility] includes sequencing and/or deletion duplication testing of the following 47 genes: APC, ATM, AXIN2, BARD1, BMPR1A, BRCA1, BRCA2, BRIP1, CDH1, CDK4, CDKN2A (p14ARF), CDKN2A (p16INK4a), CHEK2, CTNNA1, DICER1, EPCAM (Deletion/duplication testing only), GREM1 (promoter region deletion/duplication testing only), KIT, MEN1, MLH1, MSH2, MSH3, MSH6, MUTYH, NBN, NF1, NHTL1, PALB2, PDGFRA, PMS2, POLD1, POLE, PTEN, RAD50, RAD51C,  RAD51D, SDHB, SDHC, SDHD, SMAD4, SMARCA4. STK11, TP53, TSC1, TSC2, and VHL.  The following genes were evaluated for sequence changes only: SDHA and HOXB13 c.251G>A variant only. The report date is February 15, 2018.      CURRENT THERAPY:  adjuvant Letrozole once daily started 08/16/17  INTERVAL HISTORY:  Madison Stokes is scheduled for a phone visit to discuss her CT scan findings.  She identified herself by date of birth and address.  Her husband was also on the phone when I called.  She is doing well, denies any pain, abdominal discomfort, or other new symptoms.  All other systems were reviewed with the patient and are negative.  MEDICAL HISTORY:  Past Medical History:  Diagnosis Date  . Anemia    with chemo  . Cancer ([place]) 04/2017   Bil Breast Ca  . Family history of prostate cancer   . Family history of uterine cancer   . History of radiation therapy 09/25/17- 11/05/17   Left Breast/ 50 Gy in 25 fractions, right breast/ 50 Gy in 25 fractions, right SCLV/ 45 Gy in 25 fractions, right breast boost/ 10 Gy in 5 fractions.   . Hyperlipidemia   . Hypertension   . Personal history of chemotherapy   . Personal history of radiation therapy   . PONV (postoperative nausea and vomiting)    nausea only    SURGICAL HISTORY: Past Surgical  History:  Procedure Laterality Date  . BREAST BIOPSY    . BREAST LUMPECTOMY Left    2019  . BREAST LUMPECTOMY WITH RADIOACTIVE SEED AND SENTINEL LYMPH NODE BIOPSY Left 08/22/2017   Procedure: LEFT BREAST LUMPECTOMY WITH RADIOACTIVE SEED AND SENTINEL LYMPH NODE BIOPSY ERAS PATHWAY;  Surgeon: [name], MD;  Location: [facility];  Service: General;  Laterality: Left;  PECTORAL BLOCK  . COLONOSCOPY WITH PROPOFOL Left 07/14/2017   Procedure: COLONOSCOPY WITH PROPOFOL;  Surgeon: Ronnette Juniper, MD;  Location: [facility];  Service: Gastroenterology;  Laterality: Left;  . DILATION AND CURETTAGE OF UTERUS    . ESOPHAGOGASTRODUODENOSCOPY N/A 07/13/2017   Procedure:  ESOPHAGOGASTRODUODENOSCOPY (EGD);  Surgeon: Ronnette Juniper, MD;  Location: [facility];  Service: Gastroenterology;  Laterality: N/A;  . MASTECTOMY Right    2019  . MASTECTOMY W/ SENTINEL NODE BIOPSY Right 08/22/2017   Procedure: RIGHT MASTECTOMY WITH SENTINEL LYMPH NODE BIOPSY ERAS PATHWAY;  Surgeon: [name], MD;  Location: [place];  Service: General;  Laterality: Right;  PECTORAL BLOCK  . PORT-A-CATH REMOVAL Right 11/06/2017   Procedure: REMOVAL PORT-A-CATH;  Surgeon: [name], MD;  Location: [place];  Service: General;  Laterality: Right;  . PORTACATH PLACEMENT Right 05/14/2017   Procedure: ULTRASOUND GUIDED PORT PLACEMENT;  Surgeon: [name], MD;  Location: [place];  Service: General;  Laterality: Right;    I have reviewed the social history and family history with the patient and they are unchanged from previous note.  ALLERGIES:  has No Known Allergies.  MEDICATIONS:  Current Outpatient Medications  Medication Sig Dispense Refill  . amLODipine (NORVASC) 5 MG tablet Take 5 mg by mouth daily.    Marland Kitchen aspirin EC 81 MG tablet Take 81 mg by mouth daily.    Marland Kitchen atorvastatin (LIPITOR) 40 MG tablet Take 40 mg by mouth daily.    . Biotin 10 MG CAPS Take by mouth.    . Calcium Citrate-Vitamin D 500-500 MG-UNIT PACK Take 2 tablets by mouth daily.    . Cholecalciferol (VITAMIN D3 PO) Take 500 Units by mouth daily.    . irbesartan (AVAPRO) 300 MG tablet Take 300 mg by mouth daily.    Marland Kitchen letrozole (FEMARA) 2.5 MG tablet Take 1 tablet (2.5 mg total) by mouth daily. 90 tablet 3  . Multiple Vitamin (MULTIVITAMIN) tablet Take 1 tablet daily by mouth.    Marland Kitchen VITAMIN B COMPLEX-C PO Take by mouth.     No current facility-administered medications for this visit.    PHYSICAL EXAMINATION: ECOG PERFORMANCE STATUS: 0 - Asymptomatic  No vitals taken today, Exam not performed today   LABORATORY DATA:  I have reviewed the data as listed CBC Latest Ref Rng &  Units 07/26/2020 07/01/2020 12/30/2019  WBC 4.0 - 10.5 K/uL 5.3 5.9 4.7  Hemoglobin 12.0 - 15.0 g/dL 14.3 13.8 14.7  Hematocrit 36.0 - 46.0 % 44.1 41.8 43.9  Platelets 150 - 400 K/uL 261 199 212     CMP Latest Ref Rng & Units 07/26/2020 07/01/2020 12/30/2019  Glucose 70 - 99 mg/dL 74 104(H) 92  BUN 8 - 23 mg/dL _0 Creatinine 0.44 - 1.00 mg/dL 0.86 0.89 0.86  Sodium 135 - 145 mmol/L 143 142 143  Potassium 3.5 - 5.1 mmol/L 4.1 3.8 4.3  Chloride 98 - 111 mmol/L 107 107 108  CO2 22 - 32 mmol/L _1 Calcium 8.9 - 10.3 mg/dL 9.8 9.7 9.9  Total Protein 6.5 - 8.1 g/dL  7.6 7.3 7.4  Total Bilirubin 0.3 - 1.2 mg/dL 0.6 0.7 0.7  Alkaline Phos 38 - 126 U/L 263(H) 205(H) 79  AST 15 - 41 U/L 115(H) 90(H) 32  ALT 0 - 44 U/L 191(H) 142(H) 37      RADIOGRAPHIC STUDIES: I have personally reviewed the radiological images as listed and agreed with the findings in the report. No results found.   ASSESSMENT & PLAN:  MIKIYA NEBERGALL is a 75 y.o. female with   1. 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 She underwent neoadjuvant TCHP and maintenance Herceptin/Perjeta, s/p right mastectomy, left breast lumpectomy and B/l adjuvant Radiation. -She started anti-estrogen therapy with Letrozole in 07/2017. She is tolerating well. -I personally reviewed her CT scan from 2 days ago, which showed no evidence of cancer recurrence. -Continue letrozole and cancer surveillance   2. Geneticwas negative for pathogenetic mutations.  3. Bosniak category 44F cyst in the right mid kidney, 1.2cm Non-obstructive kidney stone -found on  05/22/17 CT Scan  -Her 12/17/17 CT AP showed stable renal and hepatic cysts.No further f/u scan is recommended by radiology  4.Osteopenia -Her 01/2018 DEXA showed osteopenia with the lowest T-Score at AP Spine at -2. -She completed 2 years of Zometa9/27/19-12/30/19. -Continue Calcium and VitD supplement daily. -Her 04/2020 DEXA showed improved osteopenia with lowest T-score -1.7 at right hip. Will continue to monitor. I encouraged her to increase weight bearing exercise.   5. HTN -On medications. Continue to f/u with PCP  6. Transmanitis  -Her 07/01/20 lab today shows AST 90, ALT 142, Alk hs 205, normal tbili. No hepatomegaly on exam today -She does not drink alcohol and I advised her not to take much Tylenol. She denies recent changes in her medications except atorvastatin which she started a year ago.  -I discussed her CT AP from 08/08/20 which shows no cancer recurrence, liver morphology was unremarkable. -We will discuss with her primary care physician to see if we can hold on atorvastatin, and repeat labs in 2 months -I will check hepatitis panel on next visit -If she has persistent transaminitis, will refer her to GI.   PLAN: -CT scan reviewed, no evidence of cancer recurrence, liver morphology was unremarkable. -Copy my note and the CT scan to her primary care physician Dr. Kenton Kingfisher, to see if we can stop atorvastatin -Lab and follow-up in 2 months, with hepatis panel   No problem-specific Assessment & Plan notes found for this encounter.   Orders Placed This Encounter  Procedures  . Hepatitis panel, acute    Standing Status:   Future    Standing Expiration Date:   08/11/2021   I discussed the assessment and treatment plan with the patient. The patient was provided an opportunity to ask questions and all were answered. The patient agreed with the plan and demonstrated an understanding of the instructions.  The patient was advised to call back or seek an in-person  evaluation if the symptoms worsen or if the condition fails to improve as anticipated.  The total time spent in the appointment was 25 minutes.    [name], MD 08/11/2020   I, Joslyn Devon, am  acting as scribe for [name], MD.   I have reviewed the above documentation for accuracy and completeness, and I agree with the above.

## 2020-08-09 ENCOUNTER — Other Ambulatory Visit: Payer: Self-pay

## 2020-08-09 ENCOUNTER — Ambulatory Visit (HOSPITAL_COMMUNITY)
Admission: RE | Admit: 2020-08-09 | Discharge: 2020-08-09 | Disposition: A | Discharge status: Home / Self Care | Payer: Medicare Other | Source: Ambulatory Visit | Attending: Hematology | Admitting: Hematology

## 2020-08-09 DIAGNOSIS — K449 Diaphragmatic hernia without obstruction or gangrene: Secondary | ICD-10-CM | POA: Diagnosis not present

## 2020-08-09 DIAGNOSIS — C50112 Malignant neoplasm of central portion of left female breast: Secondary | ICD-10-CM | POA: Diagnosis not present

## 2020-08-09 DIAGNOSIS — D259 Leiomyoma of uterus, unspecified: Secondary | ICD-10-CM | POA: Diagnosis not present

## 2020-08-09 DIAGNOSIS — N281 Cyst of kidney, acquired: Secondary | ICD-10-CM | POA: Diagnosis not present

## 2020-08-09 DIAGNOSIS — N200 Calculus of kidney: Secondary | ICD-10-CM | POA: Diagnosis not present

## 2020-08-09 MED ORDER — IOHEXOL 300 MG/ML  SOLN
100.0000 mL | Freq: Once | INTRAMUSCULAR | Status: AC | PRN
  Administered 2020-08-09: 100 mL via INTRAVENOUS

## 2020-08-11 ENCOUNTER — Encounter: Payer: Self-pay | Admitting: Hematology

## 2020-08-11 ENCOUNTER — Inpatient Hospital Stay (HOSPITAL_BASED_OUTPATIENT_CLINIC_OR_DEPARTMENT_OTHER): Payer: Medicare Other | Admitting: Hematology

## 2020-08-11 DIAGNOSIS — C50112 Malignant neoplasm of central portion of left female breast: Secondary | ICD-10-CM | POA: Diagnosis not present

## 2020-08-11 DIAGNOSIS — R7989 Other specified abnormal findings of blood chemistry: Secondary | ICD-10-CM

## 2020-09-22 NOTE — Progress Notes (Signed)
Madison Stokes   Telephone:([phone] Fax:(336) [phone]   Clinic Follow up Note   Patient Care Team: [name], MD as PCP - General (Family Medicine) [name], [name], MD as PCP - Cardiology (Cardiology) [name], MD as Consulting Physician (Hematology) [name], NP as Nurse Practitioner (Nurse Practitioner) [name], MD as Consulting Physician (General Surgery) [name], NP as Nurse Practitioner (Hematology and Oncology)  Date of Service:  09/29/2020  CHIEF COMPLAINT: F/u of bilateral breast cancer  SUMMARY OF ONCOLOGIC HISTORY: Oncology History Overview Note  Cancer Staging Cancer of central portion of left breast [facility]) Staging form: Breast, AJCC 8th Edition - Clinical stage from 05/03/2017: Stage IA (cT1a, cN0, cM0, G1, ER: Positive, PR: Positive, HER2: Negative) - Signed by [name], MD on 05/07/2017 - Pathologic stage from 08/22/2017: No Stage Recommended (ypT0, pN0, cM0, GX, ER: Unknown, PR: Unknown, HER2: Unknown) - Signed by [name], MD on 11/06/2017  Cancer of overlapping sites of right female breast [facility]) Staging form: Breast, AJCC 8th Edition - Clinical stage from 04/24/2017: Stage IIA (cT3, cN0, cM0, G2, ER+, PR+, HER2-) - Signed by [name], MD on 11/06/2017 - Pathologic stage from 08/22/2017: No Stage Recommended (ypT3, pN0(i+), cM0, G2, ER+, PR+, HER2-) - Signed by [name], MD on 10/29/2017     Cancer of overlapping sites of right female breast ([place])  03/08/2017 Mammogram   IMPRESSION: 1. Patient has a is palpable mass in the lateral right breast, in the location dense fibroglandular breast tissue. Although no discrete mass is seen sonographically or mammographically, the Clinical change remains concerning. Biopsy is recommended.  RECOMMENDATION: 1. Ultrasound-guided core needle biopsy of palpable abnormality in the lateral right breast.   04/24/2017 Breast US   IMPRESSION: Ultrasound guided biopsy of the  right breast. No apparent complications.   04/24/2017 Initial Biopsy   Breast, right, needle core biopsy, UOQ, centered at 9:30 o'clock - INVASIVE MAMMARY CARCINOMA. - SEE COMMENT. ER 100% positive PR 100% positive Ki67 10% HER2 FISH positive (ratio 2.10, copy number 3.30)  HER2 IHC was done on the biopsy sample after surgery, and was negative (1+). So the final HER2 is NEGATIVE (determined after surgery)   05/02/2017 Breast MRI   IMPRESSION: 1. 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 2. 4 mm enhancing mass with associated architectural distortion in the central left breast. This is suspicious for additional focus of carcinoma.   05/07/2017 Initial Diagnosis   Cancer of overlapping sites of right female breast ([place])   05/15/2017 Echocardiogram   ECHO 05/15/17  Impressions: - LVEF 60-65%, normal wall thickness, normal wall motion and GLS   strain, grade 1 DD, normal LV filling pressure, mild MR, normal   LA size, normal IVC.      05/22/2017 Imaging   CT CAP W Contrast 05/22/17 IMPRESSION: 1. No specific CT findings of nodal or other metastatic disease in the chest, abdomen, and pelvis. 2. Bosniak category 65F cyst in the right mid kidney, with mild enhancement along a septation but without nodularity. Unless follow up abdominal scans related to the patient's breast cancer adequately characterize this lesion, renal protocol MRI or CT scan would be recommended in 6 months time. 3. Other imaging findings of potential clinical significance: The Aortic Atherosclerosis (ICD10-I70.0). Ectatic ascending thoracic aorta without aneurysm. Small type 1 hiatal hernia. Several small hypodense liver lesions are likely benign although technically too small to  characterize. Nonobstructive 1.2 cm right renal calculus. Anterior uterine fundal myometrial mass favoring fibroid. Pelvic floor laxity with small  cystocele. Notable spondylosis at L3-4 and L5-S1.    05/22/2017 Imaging   Bone Scan Whole Body 05/22/17 IMPRESSION: 1. No findings of osseous metastatic disease. 2. Lower lumbar activity corresponds to areas of significant benign spondylosis on the CT scan.   05/24/2017 - 09/05/2017 Chemotherapy   TCHP every 3 weeks for 6 cycles starting 05/24/17 followed by a year of maintenance Herceptin and Perjeta. Given lack of response she stopped TCHP after 4 cycles on 07/26/17.  She will proceeded with Maintenance Herceptin and Perjeta on 08/16/17 and 09/05/17 only due to hesurgical smaple being HER2 negative     07/12/2017 - 07/14/2017 Hospital Admission   Admit date: 07/12/17 Admission diagnosis: GI Hemorrhaging  Additional comments: Colonoscopy and EGD done with no evidence of bleeding site. Given blood transfusion on 07/15/17   08/02/2017 Imaging   MRI Breast Bilateral 2/16//18 IMPRESSION: 1. The known lobular carcinoma of the right breast has not significantly changed since the prior MRI from 05/02/2017. The disease spans approximately 5.6 cm, previously 5.9 cm in the anterior to posterior dimension. 2. 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 3.  No new suspicious findings in either breast. 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   08/16/2017 -  Anti-estrogen oral therapy   Letrozole once daily starting 08/16/17    08/22/2017 Surgery    RIGHT MASTECTOMY WITH SENTINEL LYMPH NODE BIOPSY ERAS PATHWAY by Dr. Georgette Dover   08/22/2017 Pathology Results   See the oncology history under left breast cancer   09/25/2017 - 11/05/2017 Radiation Therapy   She has completed bilateral adjuvant radiation 09/25/17-11/05/17 managed by Dr. Isidore Moos. She tolerated well.   12/17/2017 Imaging   CT AP W  Contrast 12/17/17 IMPRESSION: 1. Stable bilateral renal cysts. No change since prior examination and no worrisome CT imaging features. Stable lower pole right renal cyst. 2. No acute abdominal/pelvic findings or lymphadenopathy. 3. Stable small hepatic cysts. 4. Stable uterine fibroids.   01/22/2018 PET scan   PET 01/22/18 IMPRESSION: 1. Status post right mastectomy and left breast lumpectomy. 2. Minimal left axillary scarring changes near a surgical clip. No findings suspicious for chest wall tumor or axillary or supraclavicular adenopathy. 3. No findings for metastatic disease involving the lungs, abdomen/pelvis or osseous structures   03/21/2018 - 12/30/2019 Chemotherapy   Zometa injections for osteopenia every 6 months for 2 years 03/21/18-12/30/19   03/21/2018 - 12/30/2019 Anti-estrogen oral therapy   -Zometa injections every 6 months for 2 years 03/21/18-12/30/19   Cancer of central portion of left breast ([place])  05/02/2017 Breast MRI   IMPRESSION: 1. 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 2. 4 mm enhancing mass with associated architectural distortion in the central left breast. This is suspicious for additional focus of carcinoma.   05/03/2017 Mammogram   IMPRESSION: 1. Suspicious area of architectural distortion in the left breast which corresponds to a small enhancing mass on MRI.  RECOMMENDATION: Stereotactic core needle biopsy of the area of architectural distortion in the left breast.    05/03/2017 Breast US   Stereotactic core needle biopsy of the area of architectural distortion in the left breast.  05/03/2017 Initial Biopsy   Breast, left, needle core biopsy, central, slightly toward the lower, outer quadrant - INVASIVE DUCTAL CARCINOMA, MSBR GRADE 1/2. - SEE MICROSCOPIC DESCRIPTION. ER 50% positive PR 90% positive Ki67: 1% HER2 negative    05/07/2017 Initial Diagnosis   Cancer of  central portion of left breast ([place])   08/22/2017 Surgery   LEFT BREAST LUMPECTOMY WITH RADIOACTIVE SEED AND SENTINEL LYMPH NODE BIOPSY ERAS PATHWAY by Dr. Georgette Dover 08/22/17    08/22/2017 Pathology Results   Diagnosis 08/22/17 1. Breast, lumpectomy, Left - ATYPICAL DUCTAL HYPERPLASIA - FIBROCYSTIC CHANGES - PREVIOUS BIOPSY SITE CHANGES - SEE COMMENT 2. Lymph node, sentinel, biopsy, a total of 4 nodes, all negative for malignant cells  6. Breast, simple mastectomy, Right - INVASIVE LOBULAR CARCINOMA, [place] GRADE 2/3, 6.0 CM - LOBULAR NEOPLASIA (ATYPICAL LOBULAR HYPERPLASIA) - PREVIOUS BIOPSY SITE CHANGES 7. 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  The Hereditary Gene Panel offered by [facility] includes sequencing and/or deletion duplication testing of the following 47 genes: APC, ATM, AXIN2, BARD1, BMPR1A, BRCA1, BRCA2, BRIP1, CDH1, CDK4, CDKN2A (p14ARF), CDKN2A (p16INK4a), CHEK2, CTNNA1, DICER1, EPCAM (Deletion/duplication testing only), GREM1 (promoter region deletion/duplication testing only), KIT, MEN1, MLH1, MSH2, MSH3, MSH6, MUTYH, NBN, NF1, NHTL1, PALB2, PDGFRA, PMS2, POLD1, POLE, PTEN, RAD50, RAD51C, RAD51D, SDHB, SDHC, SDHD, SMAD4, SMARCA4. STK11, TP53, TSC1, TSC2, and VHL.  The following genes were evaluated for sequence changes only: SDHA and HOXB13 c.251G>A variant only. The report date is February 15, 2018.      CURRENT THERAPY:  adjuvant Letrozole once daily started 08/16/17  INTERVAL HISTORY:  Madison Stokes is here for a follow up of her bilateral breast cancer. She was last seen by me on 08/11/20. She presents to the clinic alone today, but her husband brought her here and is waiting in the car. She was taken off Lipitor in late 07/2020.  REVIEW OF SYSTEMS:   Constitutional: Denies fevers, chills or  abnormal weight loss Eyes: Denies blurriness of vision Ears, nose, mouth, throat, and face: Denies mucositis or sore throat Respiratory: Denies cough, dyspnea or wheezes Cardiovascular: Denies palpitation, chest discomfort or lower extremity swelling Gastrointestinal:  Denies nausea, heartburn or change in bowel habits Skin: Denies abnormal skin rashes Lymphatics: Denies new lymphadenopathy or easy bruising Neurological:Denies numbness, tingling or new weaknesses Behavioral/Psych: Mood is stable, no new changes  All other systems were reviewed with the patient and are negative.  MEDICAL HISTORY:  Past Medical History:  Diagnosis Date  . Anemia    with chemo  . Cancer ([place]) 04/2017   Bil Breast Ca  . Family history of prostate cancer   . Family history of uterine cancer   . History of radiation therapy 09/25/17- 11/05/17   Left Breast/ 50 Gy in 25 fractions, right breast/ 50 Gy in 25 fractions, right SCLV/ 45 Gy in 25 fractions, right breast boost/ 10 Gy in 5 fractions.   . Hyperlipidemia   . Hypertension   . Personal history of chemotherapy   . Personal history of radiation therapy   . PONV (postoperative nausea and vomiting)    nausea only    SURGICAL HISTORY: Past Surgical History:  Procedure Laterality Date  . BREAST BIOPSY    . BREAST LUMPECTOMY Left    2019  . BREAST LUMPECTOMY WITH RADIOACTIVE SEED  AND SENTINEL LYMPH NODE BIOPSY Left 08/22/2017   Procedure: LEFT BREAST LUMPECTOMY WITH RADIOACTIVE SEED AND SENTINEL LYMPH NODE BIOPSY ERAS PATHWAY;  Surgeon: [name], MD;  Location: [place];  Service: General;  Laterality: Left;  PECTORAL BLOCK  . COLONOSCOPY WITH PROPOFOL Left 07/14/2017   Procedure: COLONOSCOPY WITH PROPOFOL;  Surgeon: Ronnette Juniper, MD;  Location: [facility];  Service: Gastroenterology;  Laterality: Left;  . DILATION AND CURETTAGE OF UTERUS    . ESOPHAGOGASTRODUODENOSCOPY N/A 07/13/2017   Procedure: ESOPHAGOGASTRODUODENOSCOPY (EGD);  Surgeon: Ronnette Juniper, MD;  Location: [facility];  Service: Gastroenterology;  Laterality: N/A;  . MASTECTOMY Right    2019  . MASTECTOMY W/ SENTINEL NODE BIOPSY Right 08/22/2017   Procedure: RIGHT MASTECTOMY WITH SENTINEL LYMPH NODE BIOPSY ERAS PATHWAY;  Surgeon: [name], MD;  Location: [place];  Service: General;  Laterality: Right;  PECTORAL BLOCK  . PORT-A-CATH REMOVAL Right 11/06/2017   Procedure: REMOVAL PORT-A-CATH;  Surgeon: [name], MD;  Location: [place];  Service: General;  Laterality: Right;  . PORTACATH PLACEMENT Right 05/14/2017   Procedure: ULTRASOUND GUIDED PORT PLACEMENT;  Surgeon: [name], MD;  Location: [place];  Service: General;  Laterality: Right;    I have reviewed the social history and family history with the patient and they are unchanged from previous note.  ALLERGIES:  has No Known Allergies.  MEDICATIONS:  Current Outpatient Medications  Medication Sig Dispense Refill  . amLODipine (NORVASC) 5 MG tablet Take 5 mg by mouth daily.    Marland Kitchen aspirin EC 81 MG tablet Take 81 mg by mouth daily.    Marland Kitchen atorvastatin (LIPITOR) 40 MG tablet Take 40 mg by mouth daily.    . Biotin 10 MG CAPS Take by mouth.    . Calcium Citrate-Vitamin D 500-500 MG-UNIT PACK Take 2 tablets by mouth daily.    . Cholecalciferol (VITAMIN D3 PO) Take 500 Units by mouth daily.    . irbesartan (AVAPRO) 300 MG tablet Take 300 mg by mouth daily.    Marland Kitchen letrozole (FEMARA) 2.5 MG tablet Take 1 tablet (2.5 mg total) by mouth daily. 90 tablet 3  . Multiple Vitamin (MULTIVITAMIN) tablet Take 1 tablet daily by mouth.    Marland Kitchen VITAMIN B COMPLEX-C PO Take by mouth.     No current facility-administered medications for this visit.    PHYSICAL EXAMINATION: ECOG PERFORMANCE STATUS: 0 - Asymptomatic  Vitals:   09/29/20 0931  BP: (!) 149/92  Pulse: 73  Resp: 18  Temp: (!) 97 F (36.1 C)  SpO2: 97%   Filed Weights   09/29/20 0931  Weight: 127 lb 12.8 oz (58 kg)     GENERAL:alert, no distress and comfortable SKIN: skin color, texture, turgor are normal, no rashes or significant lesions EYES: normal, Conjunctiva are pink and non-injected, sclera clear  NECK: supple, thyroid normal size, non-tender, without nodularity LYMPH:  no palpable lymphadenopathy in the cervical, axillary  LUNGS: clear to auscultation and percussion with normal breathing effort HEART: regular rate & rhythm and no murmurs and no lower extremity edema ABDOMEN:abdomen soft, non-tender and normal bowel sounds Musculoskeletal:no cyanosis of digits and no clubbing  NEURO: alert & oriented x 3 with fluent speech, no focal motor/sensory deficits  LABORATORY DATA:  I have reviewed the data as listed CBC Latest Ref Rng & Units 09/29/2020 07/26/2020 07/01/2020  WBC 4.0 - 10.5 K/uL 4.4 5.3 5.9  Hemoglobin 12.0 - 15.0 g/dL 14.5 14.3 13.8  Hematocrit 36.0 - 46.0 %  43.0 44.1 41.8  Platelets 150 - 400 K/uL 219 261 199     CMP Latest Ref Rng & Units 09/29/2020 07/26/2020 07/01/2020  Glucose 70 - 99 mg/dL 99 74 104(H)  BUN 8 - 23 mg/dL _0 Creatinine 0.44 - 1.00 mg/dL 0.86 0.86 0.89  Sodium 135 - 145 mmol/L 144 143 142  Potassium 3.5 - 5.1 mmol/L 4.1 4.1 3.8  Chloride 98 - 111 mmol/L 106 107 107  CO2 22 - 32 mmol/L _1 Calcium 8.9 - 10.3 mg/dL 9.7 9.8 9.7  Total Protein 6.5 - 8.1 g/dL 7.4 7.6 7.3  Total Bilirubin 0.3 - 1.2 mg/dL 0.5 0.6 0.7  Alkaline Phos 38 - 126 U/L 117 263(H) 205(H)  AST 15 - 41 U/L 44(H) 115(H) 90(H)  ALT 0 - 44 U/L 62(H) 191(H) 142(H)      RADIOGRAPHIC STUDIES: I have personally reviewed the radiological images as listed and agreed with the findings in the report. No results found.   ASSESSMENT & PLAN:  CALLYN SEVERTSON is a 75 y.o. female with   1. 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 She underwent neoadjuvant TCHP and maintenance Herceptin/Perjeta, s/p right mastectomy, left breast lumpectomy and B/l adjuvant Radiation. -She started anti-estrogen therapy with Letrozole in 07/2017. She is tolerating well. -CT A/P 08/09/20 showed no evidence of cancer recurrence. 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 No hepatomegaly on exam today -She does not drink alcohol and I advised her not to take much Tylenol. She denies recent changes in her medications except atorvastatin which she started a year ago.  -I discussed her CT AP from 08/08/20 which shows no cancer recurrence, liver morphology was unremarkable. -She was taken off atorvastatin in late 07/2020, and her transaminitis has improved   4. Geneticwas negative for pathogenetic mutations.  5. Bosniak category 20F cyst in the right mid kidney, 1.2cm Non-obstructive kidney stone -found on 05/22/17 CT Scan  -Her 12/17/17 CT AP showed stable renal and hepatic cysts.No further f/u scan is recommended by radiology  6.Osteopenia -Her 01/2018 DEXA showed osteopenia with the lowest T-Score at AP Spine at -2. -Shecompleted 2 years of Zometa9/27/19-12/30/19. -Continue Calcium and VitD supplement daily. -Her 04/2020 DEXA showed improved osteopenia with lowest T-score -1.7 at right hip. Will continue to monitor.   7. HTN -On medications. Continue to f/u with PCP   PLAN: -Continue letrozole  -I gave her prescription for new breast prostheses -labs and f/u in 6 months   -will copy her PCP   No problem-specific Assessment & Plan notes found for this encounter.   No orders of the defined types were placed in this encounter.  All questions were answered. The patient knows to call the clinic with any problems, questions or concerns. No barriers to learning was detected. The total time spent in the appointment was 30 minutes.     [name], MD 09/29/2020   I,  Katie Daubenspeck, am acting as scribe for Yan Feng, MD.   I have reviewed the above documentation for accuracy and completeness, and I agree with the above.     

## 2020-09-29 ENCOUNTER — Inpatient Hospital Stay (HOSPITAL_BASED_OUTPATIENT_CLINIC_OR_DEPARTMENT_OTHER): Payer: Medicare Other | Admitting: Hematology

## 2020-09-29 ENCOUNTER — Encounter: Payer: Self-pay | Admitting: Hematology

## 2020-09-29 ENCOUNTER — Telehealth: Payer: Self-pay | Admitting: Hematology

## 2020-09-29 ENCOUNTER — Other Ambulatory Visit: Payer: Self-pay

## 2020-09-29 ENCOUNTER — Inpatient Hospital Stay: Payer: Medicare Other | Attending: Hematology

## 2020-09-29 VITALS — BP 149/92 | HR 73 | Temp 97.0°F | Resp 18 | Ht 62.0 in | Wt 127.8 lb

## 2020-09-29 DIAGNOSIS — Z170 Estrogen receptor positive status [ER+]: Secondary | ICD-10-CM

## 2020-09-29 DIAGNOSIS — Z9011 Acquired absence of right breast and nipple: Secondary | ICD-10-CM | POA: Insufficient documentation

## 2020-09-29 DIAGNOSIS — M85851 Other specified disorders of bone density and structure, right thigh: Secondary | ICD-10-CM | POA: Insufficient documentation

## 2020-09-29 DIAGNOSIS — I10 Essential (primary) hypertension: Secondary | ICD-10-CM | POA: Insufficient documentation

## 2020-09-29 DIAGNOSIS — C50112 Malignant neoplasm of central portion of left female breast: Secondary | ICD-10-CM | POA: Insufficient documentation

## 2020-09-29 DIAGNOSIS — C50811 Malignant neoplasm of overlapping sites of right female breast: Secondary | ICD-10-CM | POA: Insufficient documentation

## 2020-09-29 DIAGNOSIS — Z923 Personal history of irradiation: Secondary | ICD-10-CM | POA: Diagnosis not present

## 2020-09-29 DIAGNOSIS — E119 Type 2 diabetes mellitus without complications: Secondary | ICD-10-CM

## 2020-09-29 DIAGNOSIS — Z79899 Other long term (current) drug therapy: Secondary | ICD-10-CM | POA: Diagnosis not present

## 2020-09-29 DIAGNOSIS — R7401 Elevation of levels of liver transaminase levels: Secondary | ICD-10-CM | POA: Diagnosis not present

## 2020-09-29 DIAGNOSIS — R7989 Other specified abnormal findings of blood chemistry: Secondary | ICD-10-CM

## 2020-09-29 LAB — CMP (CANCER CENTER ONLY)
ALT: 62 U/L — ABNORMAL HIGH (ref 0–44)
AST: 44 U/L — ABNORMAL HIGH (ref 15–41)
Albumin: 4.3 g/dL (ref 3.5–5.0)
Alkaline Phosphatase: 117 U/L (ref 38–126)
Anion gap: 12 (ref 5–15)
BUN: 17 mg/dL (ref 8–23)
CO2: 26 mmol/L (ref 22–32)
Calcium: 9.7 mg/dL (ref 8.9–10.3)
Chloride: 106 mmol/L (ref 98–111)
Creatinine: 0.86 mg/dL (ref 0.44–1.00)
GFR, Estimated: >60 mL/min (ref >60)
Glucose, Bld: 99 mg/dL (ref 70–99)
Potassium: 4.1 mmol/L (ref 3.5–5.1)
Sodium: 144 mmol/L (ref 135–145)
Total Bilirubin: 0.5 mg/dL (ref 0.3–1.2)
Total Protein: 7.4 g/dL (ref 6.5–8.1)

## 2020-09-29 LAB — CBC WITH DIFFERENTIAL (CANCER CENTER ONLY)
Abs Immature Granulocytes: 0.02 10*3/uL (ref 0.00–0.07)
Basophils Absolute: 0.1 10*3/uL (ref 0.0–0.1)
Basophils Relative: 2 %
Eosinophils Absolute: 0.2 10*3/uL (ref 0.0–0.5)
Eosinophils Relative: 4 %
HCT: 43 % (ref 36.0–46.0)
Hemoglobin: 14.5 g/dL (ref 12.0–15.0)
Immature Granulocytes: 1 %
Lymphocytes Relative: 31 %
Lymphs Abs: 1.4 10*3/uL (ref 0.7–4.0)
MCH: 31.8 pg (ref 26.0–34.0)
MCHC: 33.7 g/dL (ref 30.0–36.0)
MCV: 94.3 fL (ref 80.0–100.0)
Monocytes Absolute: 0.7 10*3/uL (ref 0.1–1.0)
Monocytes Relative: 15 %
Neutro Abs: 2.2 10*3/uL (ref 1.7–7.7)
Neutrophils Relative %: 47 %
Platelet Count: 219 10*3/uL (ref 150–400)
RBC: 4.56 MIL/uL (ref 3.87–5.11)
RDW: 14.1 % (ref 11.5–15.5)
WBC Count: 4.4 10*3/uL (ref 4.0–10.5)
nRBC: 0 % (ref 0.0–0.2)

## 2020-09-29 LAB — HEPATITIS PANEL, ACUTE
HCV Ab: NONREACTIVE
Hep A IgM: NONREACTIVE
Hep B C IgM: NONREACTIVE
Hepatitis B Surface Ag: NONREACTIVE

## 2020-09-29 NOTE — Telephone Encounter (Signed)
Scheduled per los. Confirmed appts with patient declined printout

## 2020-09-30 LAB — CANCER ANTIGEN 27.29: CA 27.29: 76.6 U/mL — ABNORMAL HIGH (ref 0.0–38.6)

## 2020-10-12 DIAGNOSIS — E7800 Pure hypercholesterolemia, unspecified: Secondary | ICD-10-CM | POA: Diagnosis not present

## 2020-10-12 DIAGNOSIS — D509 Iron deficiency anemia, unspecified: Secondary | ICD-10-CM | POA: Diagnosis not present

## 2020-10-12 DIAGNOSIS — I10 Essential (primary) hypertension: Secondary | ICD-10-CM | POA: Diagnosis not present

## 2020-10-12 DIAGNOSIS — E119 Type 2 diabetes mellitus without complications: Secondary | ICD-10-CM | POA: Diagnosis not present

## 2020-10-21 DIAGNOSIS — I10 Essential (primary) hypertension: Secondary | ICD-10-CM | POA: Diagnosis not present

## 2020-10-31 DIAGNOSIS — I10 Essential (primary) hypertension: Secondary | ICD-10-CM | POA: Diagnosis not present

## 2020-10-31 DIAGNOSIS — E7800 Pure hypercholesterolemia, unspecified: Secondary | ICD-10-CM | POA: Diagnosis not present

## 2020-10-31 DIAGNOSIS — E119 Type 2 diabetes mellitus without complications: Secondary | ICD-10-CM | POA: Diagnosis not present

## 2020-10-31 DIAGNOSIS — C50811 Malignant neoplasm of overlapping sites of right female breast: Secondary | ICD-10-CM | POA: Diagnosis not present

## 2020-10-31 DIAGNOSIS — R945 Abnormal results of liver function studies: Secondary | ICD-10-CM | POA: Diagnosis not present

## 2020-11-22 DIAGNOSIS — I10 Essential (primary) hypertension: Secondary | ICD-10-CM | POA: Diagnosis not present

## 2020-12-22 DIAGNOSIS — I10 Essential (primary) hypertension: Secondary | ICD-10-CM | POA: Diagnosis not present

## 2021-01-02 ENCOUNTER — Other Ambulatory Visit: Payer: Self-pay

## 2021-01-02 ENCOUNTER — Ambulatory Visit
Admission: RE | Admit: 2021-01-02 | Discharge: 2021-01-02 | Disposition: A | Discharge status: Home / Self Care | Payer: Medicare Other | Source: Ambulatory Visit | Attending: Internal Medicine | Admitting: Internal Medicine

## 2021-01-02 DIAGNOSIS — I77810 Thoracic aortic ectasia: Secondary | ICD-10-CM | POA: Diagnosis not present

## 2021-01-02 DIAGNOSIS — M47814 Spondylosis without myelopathy or radiculopathy, thoracic region: Secondary | ICD-10-CM | POA: Diagnosis not present

## 2021-01-02 DIAGNOSIS — J700 Acute pulmonary manifestations due to radiation: Secondary | ICD-10-CM | POA: Diagnosis not present

## 2021-01-02 DIAGNOSIS — Z853 Personal history of malignant neoplasm of breast: Secondary | ICD-10-CM | POA: Diagnosis not present

## 2021-01-02 MED ORDER — IOPAMIDOL (ISOVUE-370) INJECTION 76%
75.0000 mL | Freq: Once | INTRAVENOUS | Status: AC | PRN
  Administered 2021-01-02: 75 mL via INTRAVENOUS

## 2021-01-20 DIAGNOSIS — D509 Iron deficiency anemia, unspecified: Secondary | ICD-10-CM | POA: Diagnosis not present

## 2021-01-20 DIAGNOSIS — E119 Type 2 diabetes mellitus without complications: Secondary | ICD-10-CM | POA: Diagnosis not present

## 2021-01-20 DIAGNOSIS — E7800 Pure hypercholesterolemia, unspecified: Secondary | ICD-10-CM | POA: Diagnosis not present

## 2021-01-20 DIAGNOSIS — I10 Essential (primary) hypertension: Secondary | ICD-10-CM | POA: Diagnosis not present

## 2021-02-21 DIAGNOSIS — I10 Essential (primary) hypertension: Secondary | ICD-10-CM | POA: Diagnosis not present

## 2021-03-28 ENCOUNTER — Other Ambulatory Visit: Payer: Self-pay | Admitting: Hematology

## 2021-03-28 DIAGNOSIS — Z9889 Other specified postprocedural states: Secondary | ICD-10-CM

## 2021-03-28 DIAGNOSIS — Z9011 Acquired absence of right breast and nipple: Secondary | ICD-10-CM

## 2021-03-28 DIAGNOSIS — Z853 Personal history of malignant neoplasm of breast: Secondary | ICD-10-CM

## 2021-03-29 ENCOUNTER — Other Ambulatory Visit: Payer: Self-pay

## 2021-03-29 DIAGNOSIS — C50811 Malignant neoplasm of overlapping sites of right female breast: Secondary | ICD-10-CM

## 2021-03-29 MED ORDER — LETROZOLE 2.5 MG PO TABS: 2.5000 mg | ORAL_TABLET | Freq: Every day | ORAL | 3 refills | Status: DC

## 2021-04-04 NOTE — Progress Notes (Signed)
[place]   [phone] [phone]   Clinic Follow up Note   Patient Care Team: Madison Frees, MD as PCP - General (Family Medicine) Madison Pickett [name], MD as PCP - Cardiology (Cardiology) Madison Merle, MD as Consulting Physician (Hematology) Madison Feeling, NP as Nurse Practitioner (Nurse Practitioner) Madison Mesa, MD as Consulting Physician (General Surgery) Madison Phlegm, NP as Nurse Practitioner (Hematology and Oncology)  Date of Service:  04/05/2021  CHIEF COMPLAINT: F/u of bilateral breast cancer  ASSESSMENT & PLAN:  Madison Stokes is a 75 y.o. female with   1. 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 She underwent neoadjuvant TCHP and maintenance Herceptin/Perjeta, s/p right mastectomy, left breast lumpectomy and B/l adjuvant Radiation.  -She started anti-estrogen therapy with Letrozole in 07/2017. She is tolerating well. -CT A/P 08/09/20 showed no evidence of cancer recurrence. -She is clinically doing well, asymptomatic, lab reviewed, exam was unremarkable, there is no clinical concern for recurrence -Continue letrozole and cancer surveillance -Follow-up in 6 months    3. Transmanitis  -Her 07/01/20 lab shows AST 90, ALT 142, Alk hs 205, normal tbili. No hepatomegaly on exam today -She does not drink alcohol and I advised her not to take much Tylenol. She denies recent changes in her medications except atorvastatin which she started a year ago.  -I discussed her CT AP from 08/08/20 which shows no cancer recurrence, liver morphology was  unremarkable. -She was taken off atorvastatin in late 07/2020, and her transaminitis has resolve now     4. Genetic was negative for pathogenetic mutations.    5. Bosniak category 61F cyst in the right mid kidney, 1.2cm Non-obstructive kidney stone -found on 05/22/17 CT Scan  -Her 12/17/17 CT AP showed stable renal and hepatic cysts. No further f/u scan is recommended by radiology    6. Osteopenia  -Her 01/2018 DEXA showed osteopenia with the lowest T-Score at AP Spine at -2.  -She completed 2 years of Zometa 03/21/18-12/30/19.  -Continue Calcium and VitD supplement daily.  -Her 04/2020 DEXA showed improved osteopenia with lowest T-score -1.7 at right hip. Will continue to monitor.    7. HTN -On medications. Continue to f/u with PCP     PLAN:  -labs and f/u with NP Madison Stokes in 6 months  -continue letrozole    SUMMARY OF ONCOLOGIC HISTORY: Oncology History Overview Note  Cancer Staging Cancer of central portion of left breast ([place]) Staging form: Breast, AJCC 8th Edition - Clinical stage from 05/03/2017: Stage IA (cT1a, cN0, cM0, G1, ER: Positive, PR: Positive, HER2: Negative) - Signed by Madison Merle, MD on 05/07/2017 - Pathologic stage from 08/22/2017: No Stage Recommended (ypT0, pN0, cM0, GX, ER: Unknown, PR: Unknown, HER2: Unknown) - Signed by Madison Merle, MD on 11/06/2017  Cancer of overlapping sites of right female breast [facility]) Staging form: Breast, AJCC 8th Edition - Clinical stage from 04/24/2017: Stage IIA (cT3, cN0, cM0, G2, ER+, PR+, HER2-) - Signed by Madison Merle, MD on 11/06/2017 - Pathologic stage from 08/22/2017: No Stage Recommended (ypT3, pN0(i+), cM0, G2, ER+, PR+, HER2-) - Signed by Madison Merle, MD on 10/29/2017  Cancer of overlapping sites of right female breast ([place])  03/08/2017 Mammogram   IMPRESSION: 1. Patient has a is palpable mass in the lateral right breast, in the location dense fibroglandular breast tissue. Although no discrete mass is seen sonographically or  mammographically, the Clinical change remains concerning. Biopsy is recommended.   RECOMMENDATION: 1. Ultrasound-guided core needle biopsy of palpable abnormality in the lateral right breast.   04/24/2017 Breast US   IMPRESSION: Ultrasound guided biopsy of the right breast. No apparent complications.   04/24/2017 Initial Biopsy   Breast, right, needle core biopsy, UOQ, centered at 9:30 o'clock - INVASIVE MAMMARY CARCINOMA. - SEE COMMENT. ER 100% positive PR 100% positive Ki67 10% HER2 FISH positive (ratio 2.10, copy number 3.30)  HER2 IHC was done on the biopsy sample after surgery, and was negative (1+). So the final HER2 is NEGATIVE (determined after surgery)   05/02/2017 Breast MRI   IMPRESSION: 1. 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 2. 4 mm enhancing mass with associated architectural distortion in the central left breast. This is suspicious for additional focus of carcinoma.   05/07/2017 Initial Diagnosis   Cancer of overlapping sites of right female breast ([place])   05/15/2017 Echocardiogram   ECHO 05/15/17  Impressions:  - LVEF 60-65%, normal wall thickness, normal wall motion and GLS   strain, grade 1 DD, normal LV filling pressure, mild MR, normal   LA size, normal IVC.      05/22/2017 Imaging   CT CAP W Contrast 05/22/17 IMPRESSION: 1. No specific CT findings of nodal or other metastatic disease in the chest, abdomen, and pelvis. 2. Bosniak category 54F cyst in the right mid kidney, with mild enhancement along a septation but without nodularity. Unless follow up abdominal scans related to the patient's breast cancer adequately characterize this lesion, renal protocol MRI or CT scan would be recommended in 6 months time. 3. Other imaging findings of potential clinical significance: The Aortic Atherosclerosis (ICD10-I70.0). Ectatic ascending thoracic aorta without aneurysm.  Small type 1 hiatal hernia. Several small hypodense liver lesions are likely benign although technically too small to characterize. Nonobstructive 1.2 cm right renal calculus. Anterior uterine fundal myometrial mass favoring fibroid. Pelvic floor laxity with small cystocele. Notable spondylosis at L3-4 and L5-S1.    05/22/2017 Imaging   Bone Scan Whole Body 05/22/17 IMPRESSION: 1. No findings of osseous metastatic disease. 2. Lower lumbar activity corresponds to areas of significant benign spondylosis on the CT scan.   05/24/2017 - 09/05/2017 Chemotherapy   TCHP every 3 weeks for 6 cycles starting 05/24/17 followed by a year of maintenance Herceptin and Perjeta. Given lack of response she stopped TCHP after 4 cycles on 07/26/17.  She will proceeded with Maintenance Herceptin and Perjeta on 08/16/17 and 09/05/17 only due to hesurgical smaple being HER2 negative     07/12/2017 - 07/14/2017 Hospital Admission   Admit date: 07/12/17 Admission diagnosis: GI Hemorrhaging  Additional comments: Colonoscopy and EGD done with no evidence of bleeding site. Given blood transfusion on 07/15/17   08/02/2017 Imaging   MRI Breast Bilateral 2/16//18 IMPRESSION: 1. The known lobular carcinoma of the right breast has not significantly changed since the prior MRI from 05/02/2017. The disease spans approximately 5.6 cm, previously 5.9 cm in the anterior to posterior dimension.  2. Slight decrease in size of the biopsy proven invasive ductal carcinoma in the left breast, previously measuring 4 mm, and measuring 1 mm  on today's exam.  3.  No new suspicious findings in either breast.  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   08/16/2017 -  Anti-estrogen oral therapy   Letrozole once daily starting 08/16/17    08/22/2017 Surgery    RIGHT MASTECTOMY WITH SENTINEL LYMPH NODE BIOPSY ERAS PATHWAY by Dr. Georgette Dover    08/22/2017 Pathology Results   See the oncology history under left breast cancer   09/25/2017 - 11/05/2017 Radiation Therapy   She has completed bilateral adjuvant radiation 09/25/17-11/05/17 managed by Dr. Isidore Moos. She tolerated well.   12/17/2017 Imaging   CT AP W Contrast 12/17/17 IMPRESSION: 1. Stable bilateral renal cysts. No change since prior examination and no worrisome CT imaging features. Stable lower pole right renal cyst. 2. No acute abdominal/pelvic findings or lymphadenopathy. 3. Stable small hepatic cysts. 4. Stable uterine fibroids.   01/22/2018 PET scan   PET 01/22/18 IMPRESSION: 1. Status post right mastectomy and left breast lumpectomy. 2. Minimal left axillary scarring changes near a surgical clip. No findings suspicious for chest wall tumor or axillary or supraclavicular adenopathy. 3. No findings for metastatic disease involving the lungs, abdomen/pelvis or osseous structures   03/21/2018 - 12/30/2019 Chemotherapy   Zometa injections for osteopenia every 6 months for 2 years 03/21/18-12/30/19   03/21/2018 - 12/30/2019 Anti-estrogen oral therapy   -Zometa injections every 6 months for 2 years 03/21/18-12/30/19   Cancer of central portion of left breast ([place])  05/02/2017 Breast MRI   IMPRESSION: 1. 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 2. 4 mm enhancing mass with associated architectural distortion in the central left breast. This is suspicious for additional focus of carcinoma.   05/03/2017 Mammogram   IMPRESSION: 1. Suspicious area of architectural distortion in the left breast which corresponds to a small enhancing mass on MRI.   RECOMMENDATION: Stereotactic core needle biopsy of the area of architectural distortion in the left breast.     05/03/2017 Breast US   Stereotactic core needle biopsy of the area of architectural distortion in the left breast.   05/03/2017 Initial Biopsy    Breast, left, needle core biopsy, central, slightly toward the lower, outer quadrant - INVASIVE DUCTAL CARCINOMA, MSBR GRADE 1/2. - SEE MICROSCOPIC DESCRIPTION. ER 50% positive PR 90% positive Ki67: 1% HER2 negative    05/07/2017 Initial Diagnosis   Cancer of central portion of left breast ([place])   08/22/2017 Surgery   LEFT BREAST LUMPECTOMY WITH RADIOACTIVE SEED AND SENTINEL LYMPH NODE BIOPSY ERAS PATHWAY by Dr. Georgette Dover 08/22/17    08/22/2017 Pathology Results   Diagnosis 08/22/17 1. Breast, lumpectomy, Left - ATYPICAL DUCTAL HYPERPLASIA - FIBROCYSTIC CHANGES - PREVIOUS BIOPSY SITE CHANGES - SEE COMMENT 2. Lymph node, sentinel, biopsy, a total of 4 nodes, all negative for malignant cells  6. Breast, simple mastectomy, Right - INVASIVE LOBULAR CARCINOMA, [place] GRADE 2/3, 6.0 CM - LOBULAR NEOPLASIA (ATYPICAL LOBULAR HYPERPLASIA) - PREVIOUS BIOPSY SITE CHANGES 7. 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  The Hereditary Gene Panel offered by [facility] includes sequencing and/or deletion duplication testing of the following 47 genes: APC, ATM, AXIN2, BARD1, BMPR1A, BRCA1, BRCA2, BRIP1, CDH1, CDK4, CDKN2A (p14ARF), CDKN2A (p16INK4a), CHEK2,  CTNNA1, DICER1, EPCAM (Deletion/duplication testing only), GREM1 (promoter region deletion/duplication testing only), KIT, MEN1, MLH1, MSH2, MSH3, MSH6, MUTYH, NBN, NF1, NHTL1, PALB2, PDGFRA, PMS2, POLD1, POLE, PTEN, RAD50, RAD51C, RAD51D, SDHB, SDHC, SDHD, SMAD4, SMARCA4. STK11, TP53, TSC1, TSC2, and VHL.  The following genes were evaluated for sequence changes only: SDHA and HOXB13 c.251G>A variant only. The report date is February 15, 2018.      CURRENT THERAPY:  adjuvant Letrozole once daily started 08/16/17  INTERVAL HISTORY:  Madison Stokes is here for a follow up of her  bilateral breast cancer. She was last seen by me on 09/29/20. She presents to the clinic alone today.  She is clinically doing well, denies any pain or other discomfort.  She was started on atorvastatin, and tolerating well.  She has good appetite and energy level, weight is stable.  REVIEW OF SYSTEMS:   Constitutional: Denies fevers, chills or abnormal weight loss Eyes: Denies blurriness of vision Ears, nose, mouth, throat, and face: Denies mucositis or sore throat Respiratory: Denies cough, dyspnea or wheezes Cardiovascular: Denies palpitation, chest discomfort or lower extremity swelling Gastrointestinal:  Denies nausea, heartburn or change in bowel habits Skin: Denies abnormal skin rashes Lymphatics: Denies new lymphadenopathy or easy bruising Neurological:Denies numbness, tingling or new weaknesses Behavioral/Psych: Mood is stable, no new changes  All other systems were reviewed with the patient and are negative.  MEDICAL HISTORY:  Past Medical History:  Diagnosis Date   Anemia    with chemo   Cancer (HCC) 04/2017   Bil Breast Ca   Family history of prostate cancer    Family history of uterine cancer    History of radiation therapy 09/25/17- 11/05/17   Left Breast/ 50 Gy in 25 fractions, right breast/ 50 Gy in 25 fractions, right SCLV/ 45 Gy in 25 fractions, right breast boost/ 10 Gy in 5 fractions.    Hyperlipidemia    Hypertension    Personal history of chemotherapy    Personal history of radiation therapy    PONV (postoperative nausea and vomiting)    nausea only    SURGICAL HISTORY: Past Surgical History:  Procedure Laterality Date   BREAST BIOPSY     BREAST LUMPECTOMY Left    2019   BREAST LUMPECTOMY WITH RADIOACTIVE SEED AND SENTINEL LYMPH NODE BIOPSY Left 08/22/2017   Procedure: LEFT BREAST LUMPECTOMY WITH RADIOACTIVE SEED AND SENTINEL LYMPH NODE BIOPSY ERAS PATHWAY;  Surgeon: Manus Rudd, MD;  Location: [facility];  Service: General;  Laterality: Left;  PECTORAL BLOCK    COLONOSCOPY WITH PROPOFOL Left 07/14/2017   Procedure: COLONOSCOPY WITH PROPOFOL;  Surgeon: Kerin Salen, MD;  Location: [facility];  Service: Gastroenterology;  Laterality: Left;   DILATION AND CURETTAGE OF UTERUS     ESOPHAGOGASTRODUODENOSCOPY N/A 07/13/2017   Procedure: ESOPHAGOGASTRODUODENOSCOPY (EGD);  Surgeon: Kerin Salen, MD;  Location: [facility];  Service: Gastroenterology;  Laterality: N/A;   MASTECTOMY Right    2019   MASTECTOMY W/ SENTINEL NODE BIOPSY Right 08/22/2017   Procedure: RIGHT MASTECTOMY WITH SENTINEL LYMPH NODE BIOPSY ERAS PATHWAY;  Surgeon: Manus Rudd, MD;  Location: [facility];  Service: General;  Laterality: Right;  PECTORAL BLOCK   PORT-A-CATH REMOVAL Right 11/06/2017   Procedure: REMOVAL PORT-A-CATH;  Surgeon: Manus Rudd, MD;  Location: [facility];  Service: General;  Laterality: Right;   PORTACATH PLACEMENT Right 05/14/2017   Procedure: ULTRASOUND GUIDED PORT PLACEMENT;  Surgeon: Manus Rudd, MD;  Location: [facility];  Service: General;  Laterality: Right;  I have reviewed the social history and family history with the patient and they are unchanged from previous note.  ALLERGIES:  has No Known Allergies.  MEDICATIONS:  Current Outpatient Medications  Medication Sig Dispense Refill   amLODipine (NORVASC) 5 MG tablet Take 5 mg by mouth daily.     aspirin EC 81 MG tablet Take 81 mg by mouth daily.     atorvastatin (LIPITOR) 40 MG tablet Take 40 mg by mouth daily.     Biotin 10 MG CAPS Take by mouth.     Calcium Citrate-Vitamin D 500-500 MG-UNIT PACK Take 2 tablets by mouth daily.     Cholecalciferol (VITAMIN D3 PO) Take 500 Units by mouth daily.     irbesartan (AVAPRO) 300 MG tablet Take 300 mg by mouth daily.     letrozole (FEMARA) 2.5 MG tablet Take 1 tablet (2.5 mg total) by mouth daily. 90 tablet 3   Multiple Vitamin (MULTIVITAMIN) tablet Take 1 tablet daily by mouth.     VITAMIN B COMPLEX-C PO Take by mouth.     No  current facility-administered medications for this visit.    PHYSICAL EXAMINATION: ECOG PERFORMANCE STATUS: 0 - Asymptomatic  Vitals:   04/05/21 0925  BP: 118/90  Pulse: 77  Resp: 18  Temp: 98.2 F (36.8 C)  SpO2: 98%    Filed Weights   04/05/21 0925  Weight: 128 lb 4.8 oz (58.2 kg)     GENERAL:alert, no distress and comfortable SKIN: skin color, texture, turgor are normal, no rashes or significant lesions EYES: normal, Conjunctiva are pink and non-injected, sclera clear  NECK: supple, thyroid normal size, non-tender, without nodularity LYMPH:  no palpable lymphadenopathy in the cervical, axillary  LUNGS: clear to auscultation and percussion with normal breathing effort HEART: regular rate & rhythm and no murmurs and no lower extremity edema ABDOMEN:abdomen soft, non-tender and normal bowel sounds Musculoskeletal:no cyanosis of digits and no clubbing  NEURO: alert & oriented x 3 with fluent speech, no focal motor/sensory deficits BREAST: S/p right mastectomy and left lumpectomy: Surgical incision healed well. No palpable mass, nodules or adenopathy bilaterally. Breast exam benign.     LABORATORY DATA:  I have reviewed the data as listed CBC Latest Ref Rng & Units 04/05/2021 09/29/2020 07/26/2020  WBC 4.0 - 10.5 K/uL 4.3 4.4 5.3  Hemoglobin 12.0 - 15.0 g/dL 14.5 14.5 14.3  Hematocrit 36.0 - 46.0 % 42.4 43.0 44.1  Platelets 150 - 400 K/uL 228 219 261     CMP Latest Ref Rng & Units 04/05/2021 09/29/2020 07/26/2020  Glucose 70 - 99 mg/dL 100(H) 99 74  BUN 8 - 23 mg/dL _0 Creatinine 0.44 - 1.00 mg/dL 0.78 0.86 0.86  Sodium 135 - 145 mmol/L 145 144 143  Potassium 3.5 - 5.1 mmol/L 4.2 4.1 4.1  Chloride 98 - 111 mmol/L 108 106 107  CO2 22 - 32 mmol/L _1 Calcium 8.9 - 10.3 mg/dL 10.2 9.7 9.8  Total Protein 6.5 - 8.1 g/dL 7.6 7.4 7.6  Total Bilirubin 0.3 - 1.2 mg/dL 0.7 0.5 0.6  Alkaline Phos 38 - 126 U/L 81 117 263(H)  AST 15 - 41 U/L 34 44(H) 115(H)  ALT 0 - 44  U/L 36 62(H) 191(H)      RADIOGRAPHIC STUDIES: I have personally reviewed the radiological images as listed and agreed with the findings in the report. No results found.     No problem-specific Assessment & Plan notes found for this encounter.  No orders of the defined types were placed in this encounter.  All questions were answered. The patient knows to call the clinic with any problems, questions or concerns. No barriers to learning was detected. The total time spent in the appointment was 30 minutes.     Madison Merle, MD 04/05/2021   I, Wilburn Mylar, am acting as scribe for Madison Merle, MD.   I have reviewed the above documentation for accuracy and completeness, and I agree with the above.

## 2021-04-05 ENCOUNTER — Inpatient Hospital Stay: Payer: Medicare Other | Attending: Hematology | Admitting: Hematology

## 2021-04-05 ENCOUNTER — Encounter: Payer: Self-pay | Admitting: Hematology

## 2021-04-05 ENCOUNTER — Inpatient Hospital Stay: Payer: Medicare Other

## 2021-04-05 ENCOUNTER — Other Ambulatory Visit: Payer: Self-pay

## 2021-04-05 VITALS — BP 118/90 | HR 77 | Temp 98.2°F | Resp 18 | Ht 62.0 in | Wt 128.3 lb

## 2021-04-05 DIAGNOSIS — R7401 Elevation of levels of liver transaminase levels: Secondary | ICD-10-CM | POA: Diagnosis not present

## 2021-04-05 DIAGNOSIS — C50811 Malignant neoplasm of overlapping sites of right female breast: Secondary | ICD-10-CM | POA: Insufficient documentation

## 2021-04-05 DIAGNOSIS — C50112 Malignant neoplasm of central portion of left female breast: Secondary | ICD-10-CM

## 2021-04-05 DIAGNOSIS — I10 Essential (primary) hypertension: Secondary | ICD-10-CM | POA: Insufficient documentation

## 2021-04-05 DIAGNOSIS — Z170 Estrogen receptor positive status [ER+]: Secondary | ICD-10-CM | POA: Diagnosis not present

## 2021-04-05 DIAGNOSIS — M85851 Other specified disorders of bone density and structure, right thigh: Secondary | ICD-10-CM | POA: Insufficient documentation

## 2021-04-05 LAB — CMP (CANCER CENTER ONLY)
ALT: 36 U/L (ref 0–44)
AST: 34 U/L (ref 15–41)
Albumin: 4.6 g/dL (ref 3.5–5.0)
Alkaline Phosphatase: 81 U/L (ref 38–126)
Anion gap: 8 (ref 5–15)
BUN: 20 mg/dL (ref 8–23)
CO2: 29 mmol/L (ref 22–32)
Calcium: 10.2 mg/dL (ref 8.9–10.3)
Chloride: 108 mmol/L (ref 98–111)
Creatinine: 0.78 mg/dL (ref 0.44–1.00)
GFR, Estimated: >60 mL/min (ref >60)
Glucose, Bld: 100 mg/dL — ABNORMAL HIGH (ref 70–99)
Potassium: 4.2 mmol/L (ref 3.5–5.1)
Sodium: 145 mmol/L (ref 135–145)
Total Bilirubin: 0.7 mg/dL (ref 0.3–1.2)
Total Protein: 7.6 g/dL (ref 6.5–8.1)

## 2021-04-05 LAB — CBC WITH DIFFERENTIAL (CANCER CENTER ONLY)
Abs Immature Granulocytes: 0.02 10*3/uL (ref 0.00–0.07)
Basophils Absolute: 0 10*3/uL (ref 0.0–0.1)
Basophils Relative: 1 %
Eosinophils Absolute: 0.1 10*3/uL (ref 0.0–0.5)
Eosinophils Relative: 2 %
HCT: 42.4 % (ref 36.0–46.0)
Hemoglobin: 14.5 g/dL (ref 12.0–15.0)
Immature Granulocytes: 1 %
Lymphocytes Relative: 27 %
Lymphs Abs: 1.2 10*3/uL (ref 0.7–4.0)
MCH: 31.9 pg (ref 26.0–34.0)
MCHC: 34.2 g/dL (ref 30.0–36.0)
MCV: 93.2 fL (ref 80.0–100.0)
Monocytes Absolute: 0.6 10*3/uL (ref 0.1–1.0)
Monocytes Relative: 15 %
Neutro Abs: 2.4 10*3/uL (ref 1.7–7.7)
Neutrophils Relative %: 54 %
Platelet Count: 228 10*3/uL (ref 150–400)
RBC: 4.55 MIL/uL (ref 3.87–5.11)
RDW: 13.1 % (ref 11.5–15.5)
WBC Count: 4.3 10*3/uL (ref 4.0–10.5)
nRBC: 0 % (ref 0.0–0.2)

## 2021-04-05 NOTE — Addendum Note (Signed)
Addended by: Estella Husk on: 04/05/2021 01:27 PM   Modules accepted: Orders

## 2021-04-06 LAB — CANCER ANTIGEN 27.29: CA 27.29: 74.5 U/mL — ABNORMAL HIGH (ref 0.0–38.6)

## 2021-04-21 DIAGNOSIS — I10 Essential (primary) hypertension: Secondary | ICD-10-CM | POA: Diagnosis not present

## 2021-05-10 ENCOUNTER — Ambulatory Visit
Admission: RE | Admit: 2021-05-10 | Discharge: 2021-05-10 | Disposition: A | Discharge status: Home / Self Care | Payer: Medicare Other | Source: Ambulatory Visit | Attending: Hematology | Admitting: Hematology

## 2021-05-10 ENCOUNTER — Other Ambulatory Visit: Payer: Self-pay | Admitting: Hematology

## 2021-05-10 ENCOUNTER — Other Ambulatory Visit: Payer: Self-pay

## 2021-05-10 DIAGNOSIS — Z9011 Acquired absence of right breast and nipple: Secondary | ICD-10-CM

## 2021-05-10 DIAGNOSIS — Z853 Personal history of malignant neoplasm of breast: Secondary | ICD-10-CM

## 2021-05-10 DIAGNOSIS — Z9889 Other specified postprocedural states: Secondary | ICD-10-CM

## 2021-05-10 DIAGNOSIS — Z1231 Encounter for screening mammogram for malignant neoplasm of breast: Secondary | ICD-10-CM | POA: Diagnosis not present

## 2021-05-11 ENCOUNTER — Other Ambulatory Visit: Payer: Self-pay | Admitting: Hematology

## 2021-05-11 DIAGNOSIS — R928 Other abnormal and inconclusive findings on diagnostic imaging of breast: Secondary | ICD-10-CM

## 2021-05-17 DIAGNOSIS — E119 Type 2 diabetes mellitus without complications: Secondary | ICD-10-CM | POA: Diagnosis not present

## 2021-05-17 DIAGNOSIS — Z23 Encounter for immunization: Secondary | ICD-10-CM | POA: Diagnosis not present

## 2021-05-17 DIAGNOSIS — I10 Essential (primary) hypertension: Secondary | ICD-10-CM | POA: Diagnosis not present

## 2021-05-17 DIAGNOSIS — Z0000 Encounter for general adult medical examination without abnormal findings: Secondary | ICD-10-CM | POA: Diagnosis not present

## 2021-05-17 DIAGNOSIS — J301 Allergic rhinitis due to pollen: Secondary | ICD-10-CM | POA: Diagnosis not present

## 2021-05-17 DIAGNOSIS — E7800 Pure hypercholesterolemia, unspecified: Secondary | ICD-10-CM | POA: Diagnosis not present

## 2021-05-24 DIAGNOSIS — I10 Essential (primary) hypertension: Secondary | ICD-10-CM | POA: Diagnosis not present

## 2021-06-08 ENCOUNTER — Ambulatory Visit
Admission: RE | Admit: 2021-06-08 | Discharge: 2021-06-08 | Disposition: A | Discharge status: Home / Self Care | Payer: Medicare Other | Source: Ambulatory Visit | Attending: Hematology | Admitting: Hematology

## 2021-06-08 DIAGNOSIS — R928 Other abnormal and inconclusive findings on diagnostic imaging of breast: Secondary | ICD-10-CM

## 2021-06-08 DIAGNOSIS — R922 Inconclusive mammogram: Secondary | ICD-10-CM | POA: Diagnosis not present

## 2021-06-13 ENCOUNTER — Other Ambulatory Visit: Payer: Medicare Other

## 2021-06-23 DIAGNOSIS — I10 Essential (primary) hypertension: Secondary | ICD-10-CM | POA: Diagnosis not present

## 2021-06-26 DIAGNOSIS — H35372 Puckering of macula, left eye: Secondary | ICD-10-CM | POA: Diagnosis not present

## 2021-06-26 DIAGNOSIS — Z961 Presence of intraocular lens: Secondary | ICD-10-CM | POA: Diagnosis not present

## 2021-06-26 DIAGNOSIS — H02831 Dermatochalasis of right upper eyelid: Secondary | ICD-10-CM | POA: Diagnosis not present

## 2021-06-26 DIAGNOSIS — H04123 Dry eye syndrome of bilateral lacrimal glands: Secondary | ICD-10-CM | POA: Diagnosis not present

## 2021-07-14 DIAGNOSIS — E7800 Pure hypercholesterolemia, unspecified: Secondary | ICD-10-CM | POA: Diagnosis not present

## 2021-07-14 DIAGNOSIS — E119 Type 2 diabetes mellitus without complications: Secondary | ICD-10-CM | POA: Diagnosis not present

## 2021-07-14 DIAGNOSIS — I10 Essential (primary) hypertension: Secondary | ICD-10-CM | POA: Diagnosis not present

## 2021-07-14 DIAGNOSIS — D509 Iron deficiency anemia, unspecified: Secondary | ICD-10-CM | POA: Diagnosis not present

## 2021-07-25 DIAGNOSIS — I10 Essential (primary) hypertension: Secondary | ICD-10-CM | POA: Diagnosis not present

## 2021-10-18 ENCOUNTER — Encounter: Payer: Self-pay | Admitting: Nurse Practitioner

## 2021-10-18 ENCOUNTER — Inpatient Hospital Stay: Payer: Medicare Other | Attending: Nurse Practitioner

## 2021-10-18 ENCOUNTER — Inpatient Hospital Stay (HOSPITAL_BASED_OUTPATIENT_CLINIC_OR_DEPARTMENT_OTHER): Payer: Medicare Other | Admitting: Nurse Practitioner

## 2021-10-18 ENCOUNTER — Other Ambulatory Visit: Payer: Self-pay

## 2021-10-18 VITALS — BP 146/83 | HR 71 | Temp 98.3°F | Resp 18 | Wt 128.4 lb

## 2021-10-18 DIAGNOSIS — E2839 Other primary ovarian failure: Secondary | ICD-10-CM | POA: Diagnosis not present

## 2021-10-18 DIAGNOSIS — Z780 Asymptomatic menopausal state: Secondary | ICD-10-CM | POA: Insufficient documentation

## 2021-10-18 DIAGNOSIS — C50112 Malignant neoplasm of central portion of left female breast: Secondary | ICD-10-CM | POA: Diagnosis not present

## 2021-10-18 DIAGNOSIS — Z79811 Long term (current) use of aromatase inhibitors: Secondary | ICD-10-CM | POA: Insufficient documentation

## 2021-10-18 DIAGNOSIS — C50811 Malignant neoplasm of overlapping sites of right female breast: Secondary | ICD-10-CM | POA: Diagnosis not present

## 2021-10-18 DIAGNOSIS — Z170 Estrogen receptor positive status [ER+]: Secondary | ICD-10-CM | POA: Diagnosis not present

## 2021-10-18 DIAGNOSIS — Z9011 Acquired absence of right breast and nipple: Secondary | ICD-10-CM | POA: Insufficient documentation

## 2021-10-18 DIAGNOSIS — Z9221 Personal history of antineoplastic chemotherapy: Secondary | ICD-10-CM | POA: Diagnosis not present

## 2021-10-18 DIAGNOSIS — Z923 Personal history of irradiation: Secondary | ICD-10-CM | POA: Insufficient documentation

## 2021-10-18 DIAGNOSIS — M8580 Other specified disorders of bone density and structure, unspecified site: Secondary | ICD-10-CM | POA: Insufficient documentation

## 2021-10-18 LAB — CBC WITH DIFFERENTIAL (CANCER CENTER ONLY)
Abs Immature Granulocytes: 0.01 10*3/uL (ref 0.00–0.07)
Basophils Absolute: 0.1 10*3/uL (ref 0.0–0.1)
Basophils Relative: 1 %
Eosinophils Absolute: 0.1 10*3/uL (ref 0.0–0.5)
Eosinophils Relative: 4 %
HCT: 41.5 % (ref 36.0–46.0)
Hemoglobin: 14 g/dL (ref 12.0–15.0)
Immature Granulocytes: 0 %
Lymphocytes Relative: 29 %
Lymphs Abs: 1 10*3/uL (ref 0.7–4.0)
MCH: 31.6 pg (ref 26.0–34.0)
MCHC: 33.7 g/dL (ref 30.0–36.0)
MCV: 93.7 fL (ref 80.0–100.0)
Monocytes Absolute: 0.5 10*3/uL (ref 0.1–1.0)
Monocytes Relative: 14 %
Neutro Abs: 1.8 10*3/uL (ref 1.7–7.7)
Neutrophils Relative %: 52 %
Platelet Count: 206 10*3/uL (ref 150–400)
RBC: 4.43 MIL/uL (ref 3.87–5.11)
RDW: 13.3 % (ref 11.5–15.5)
WBC Count: 3.5 10*3/uL — ABNORMAL LOW (ref 4.0–10.5)
nRBC: 0 % (ref 0.0–0.2)

## 2021-10-18 LAB — CMP (CANCER CENTER ONLY)
ALT: 26 U/L (ref 0–44)
AST: 27 U/L (ref 15–41)
Albumin: 4.2 g/dL (ref 3.5–5.0)
Alkaline Phosphatase: 85 U/L (ref 38–126)
Anion gap: 5 (ref 5–15)
BUN: 19 mg/dL (ref 8–23)
CO2: 30 mmol/L (ref 22–32)
Calcium: 9.5 mg/dL (ref 8.9–10.3)
Chloride: 108 mmol/L (ref 98–111)
Creatinine: 0.8 mg/dL (ref 0.44–1.00)
GFR, Estimated: >60 mL/min (ref >60)
Glucose, Bld: 113 mg/dL — ABNORMAL HIGH (ref 70–99)
Potassium: 3.8 mmol/L (ref 3.5–5.1)
Sodium: 143 mmol/L (ref 135–145)
Total Bilirubin: 0.5 mg/dL (ref 0.3–1.2)
Total Protein: 6.7 g/dL (ref 6.5–8.1)

## 2021-10-18 NOTE — Progress Notes (Signed)
?[place]   ?Telephone:([phone]) [phone] [phone]   ?Clinic Follow up Note  ? ?Patient Care Team: ?[name], MD as PCP - General (Family Medicine) ?Pixie Casino, MD as PCP - Cardiology (Cardiology) ?[name], MD as Consulting Physician (Hematology) ?[name], NP as Nurse Practitioner (Nurse Practitioner) ?[name], MD as Consulting Physician (General Surgery) ?[name], NP as Nurse Practitioner (Hematology and Oncology) ?10/18/2021 ? ?CHIEF COMPLAINT: Follow-up bilateral breast cancer ? ?SUMMARY OF ONCOLOGIC HISTORY: ?Oncology History Overview Note  ?Cancer Staging ?Cancer of central portion of left breast ([place]) ?Staging form: Breast, AJCC 8th Edition ?- Clinical stage from 05/03/2017: Stage IA (cT1a, cN0, cM0, G1, ER: Positive, PR: Positive, HER2: Negative) - Signed by [name], MD on 05/07/2017 ?- Pathologic stage from 08/22/2017: No Stage Recommended (ypT0, pN0, cM0, GX, ER: Unknown, PR: Unknown, HER2: Unknown) - Signed by [name], MD on 11/06/2017 ? ?Cancer of overlapping sites of right female breast [facility]) ?Staging form: Breast, AJCC 8th Edition ?- Clinical stage from 04/24/2017: Stage IIA (cT3, cN0, cM0, G2, ER+, PR+, HER2-) - Signed by [name], MD on 11/06/2017 ?- Pathologic stage from 08/22/2017: No Stage Recommended (ypT3, pN0(i+), cM0, G2, ER+, PR+, HER2-) - Signed by [name], MD on 10/29/2017 ? ? ?  ?Cancer of overlapping sites of right female breast [facility])  ?03/08/2017 Mammogram  ? IMPRESSION: ?1. Patient has a is palpable mass in the lateral right breast, in ?the location dense fibroglandular breast tissue. Although no ?discrete mass is seen sonographically or mammographically, the ?Clinical change remains concerning. Biopsy is recommended. ?  ?RECOMMENDATION: ?1. Ultrasound-guided core needle biopsy of palpable abnormality in ?the lateral right breast. ? ?  ?04/24/2017 Breast US  ? IMPRESSION: ?Ultrasound guided biopsy of the right breast.  No apparent ?complications. ? ?  ?04/24/2017 Initial Biopsy  ? Breast, right, needle core biopsy, UOQ, centered at 9:30 o'clock ?- INVASIVE MAMMARY CARCINOMA. ?- SEE COMMENT. ?ER 100% positive PR 100% positive Ki67 10% HER2 FISH positive (ratio 2.10, copy number 3.30) ? ?HER2 IHC was done on the biopsy sample after surgery, and was negative (1+). So the final HER2 is NEGATIVE (determined after surgery) ? ?  ?05/02/2017 Breast MRI  ? IMPRESSION: ?1. 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 ?2. 4 mm enhancing mass with associated architectural distortion in ?the central left breast. This is suspicious for additional focus of ?carcinoma. ? ?  ?05/07/2017 Initial Diagnosis  ? 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 ?  ?05/15/2017 Echocardiogram  ? ECHO 05/15/17  ?Impressions: ? - LVEF 60-65%, normal wall thickness, normal wall motion and GLS ?  strain, grade 1 DD, normal LV filling pressure, mild MR, normal ?  LA size, normal IVC. ? ? ? ? ?  ?05/22/2017 Imaging  ? CT CAP W Contrast 05/22/17 ?IMPRESSION: ?1. No specific CT findings of nodal or other metastatic disease in ?the chest, abdomen, and pelvis. ?2. Bosniak category 31F cyst in the right mid kidney, with mild ?enhancement along a septation but without nodularity. Unless follow ?up abdominal scans related to the patient's breast cancer adequately ?characterize this lesion, renal protocol MRI or CT scan would be ?recommended in 6 months time. ?3. Other imaging findings of potential clinical significance: The ?Aortic Atherosclerosis (ICD10-I70.0). Ectatic ascending thoracic ?aorta without aneurysm. Small type 1 hiatal hernia. Several small ?hypodense liver lesions are likely benign although technically too ?  small to characterize. Nonobstructive 1.2 cm right renal calculus. ?Anterior uterine fundal myometrial mass favoring fibroid. Pelvic ?floor laxity with small  cystocele. Notable spondylosis at L3-4 and ?L5-S1. ? ? ?  ?05/22/2017 Imaging  ? Bone Scan Whole Body 05/22/17 ?IMPRESSION: ?1. No findings of osseous metastatic disease. ?2. Lower lumbar activity corresponds to areas of significant benign ?spondylosis on the CT scan. ? ?  ?05/24/2017 - 09/05/2017 Chemotherapy  ? TCHP every 3 weeks for 6 cycles starting 05/24/17 followed by a year of maintenance Herceptin and Perjeta. Given lack of response she stopped TCHP after 4 cycles on 07/26/17.  ?She will proceeded with Maintenance Herceptin and Perjeta on 08/16/17 and 09/05/17 only due to hesurgical smaple being HER2 negative ? ? ?  ?07/12/2017 - 07/14/2017 Hospital Admission  ? Admit date: 07/12/17 ?Admission diagnosis: GI Hemorrhaging  ?Additional comments: Colonoscopy and EGD done with no evidence of bleeding site. Given blood transfusion on 07/15/17 ? ?  ?08/02/2017 Imaging  ? MRI Breast Bilateral 2/16//18 ?IMPRESSION: ?1. The known lobular carcinoma of the right breast has not ?significantly changed since the prior MRI from 05/02/2017. The ?disease spans approximately 5.6 cm, previously 5.9 cm in the ?anterior to posterior dimension. ? 2. Slight decrease in size of the biopsy proven invasive ductal ?carcinoma in the left breast, previously measuring 4 mm, and ?measuring 1 mm on today's exam. ? 3.  No new suspicious findings in either breast. ? RECOMMENDATION: ?Continue treatment plan for invasive lobular cancer of the right ?breast and invasive ductal carcinoma in the left breast. ? ?  ?08/16/2017 -  Anti-estrogen oral therapy  ? Letrozole once daily starting 08/16/17  ?  ?08/22/2017 Surgery  ?  ?RIGHT MASTECTOMY WITH SENTINEL LYMPH NODE BIOPSY ERAS PATHWAY by Dr. Georgette Dover ? ?  ?08/22/2017 Pathology Results  ? See the oncology history under left breast cancer ? ?  ?09/25/2017 - 11/05/2017 Radiation Therapy  ? She has completed bilateral adjuvant radiation 09/25/17-11/05/17 managed by Dr. Isidore Moos. She tolerated well. ?  ?12/17/2017 Imaging  ? CT  AP W Contrast 12/17/17 ?IMPRESSION: ?1. Stable bilateral renal cysts. No change since prior examination ?and no worrisome CT imaging features. Stable lower pole right renal ?cyst. ?2. No acute abdominal/pelvic findings or lymphadenopathy. ?3. Stable small hepatic cysts. ?4. Stable uterine fibroids. ? ?  ?01/22/2018 PET scan  ? PET 01/22/18 ?IMPRESSION: ?1. Status post right mastectomy and left breast lumpectomy. ?2. Minimal left axillary scarring changes near a surgical clip. No ?findings suspicious for chest wall tumor or axillary or ?supraclavicular adenopathy. ?3. No findings for metastatic disease involving the lungs, ?abdomen/pelvis or osseous structures ? ?  ?03/21/2018 - 12/30/2019 Chemotherapy  ? Zometa injections for osteopenia every 6 months for 2 years 03/21/18-12/30/19 ?  ?03/21/2018 - 12/30/2019 Anti-estrogen oral therapy  ? -Zometa injections every 6 months for 2 years 03/21/18-12/30/19 ?  ?Cancer of central portion of left breast ([place])  ?05/02/2017 Breast MRI  ? IMPRESSION: ?1. 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 ?2. 4 mm enhancing mass with associated architectural distortion in ?the central left breast. This is suspicious for additional focus of ?carcinoma. ? ?  ?05/03/2017 Mammogram  ? IMPRESSION: ?1. Suspicious area of architectural distortion in the left breast ?which corresponds to a small enhancing mass on MRI. ?  ?RECOMMENDATION: ?Stereotactic core needle biopsy of the area of architectural ?distortion in the left breast. ?  ? ?  ?05/03/2017 Breast US  ?  Stereotactic core needle biopsy of the area of architectural ?distortion in the left breast. ? ?  ?05/03/2017 Initial Biopsy  ? Breast, left, needle core biopsy, central, slightly toward the lower, outer quadrant ?- INVASIVE DUCTAL CARCINOMA, MSBR GRADE 1/2. ?- SEE MICROSCOPIC DESCRIPTION. ?ER 50% positive ?PR 90% positive Ki67: 1% ?HER2 negative  ? ?  ?05/07/2017 Initial  Diagnosis  ? Cancer of central portion of left breast ([place]) ? ?  ?08/22/2017 Surgery  ? LEFT BREAST LUMPECTOMY WITH RADIOACTIVE SEED AND SENTINEL LYMPH NODE BIOPSY ERAS PATHWAY by Dr. Georgette Dover ?08/22/17 ? ? ?  ?08/22/2017 P

## 2021-10-19 LAB — CANCER ANTIGEN 27.29: CA 27.29: 70 U/mL — ABNORMAL HIGH (ref 0.0–38.6)

## 2021-11-21 DIAGNOSIS — I10 Essential (primary) hypertension: Secondary | ICD-10-CM | POA: Diagnosis not present

## 2021-11-21 DIAGNOSIS — E7800 Pure hypercholesterolemia, unspecified: Secondary | ICD-10-CM | POA: Diagnosis not present

## 2021-11-21 DIAGNOSIS — E119 Type 2 diabetes mellitus without complications: Secondary | ICD-10-CM | POA: Diagnosis not present

## 2021-11-21 DIAGNOSIS — C50811 Malignant neoplasm of overlapping sites of right female breast: Secondary | ICD-10-CM | POA: Diagnosis not present

## 2021-11-21 DIAGNOSIS — J301 Allergic rhinitis due to pollen: Secondary | ICD-10-CM | POA: Diagnosis not present

## 2022-02-19 DIAGNOSIS — M79604 Pain in right leg: Secondary | ICD-10-CM | POA: Diagnosis not present

## 2022-04-03 DIAGNOSIS — B351 Tinea unguium: Secondary | ICD-10-CM | POA: Diagnosis not present

## 2022-04-03 DIAGNOSIS — Z23 Encounter for immunization: Secondary | ICD-10-CM | POA: Diagnosis not present

## 2022-04-17 NOTE — Progress Notes (Unsigned)
[name]   [phone]) [phone] [phone]   Clinic Follow up Note   Patient Care Team: [name], MD as PCP - General (Family Medicine) [name], [name], MD as PCP - Cardiology (Cardiology) [name], MD as Consulting Physician (Hematology) [name], NP as Nurse Practitioner (Nurse Practitioner) [name], MD as Consulting Physician (General Surgery) [name], NP as Nurse Practitioner (Hematology and Oncology) 04/18/2022  CHIEF COMPLAINT: Follow-up bilateral breast cancer  SUMMARY OF ONCOLOGIC HISTORY: Oncology History Overview Note  Cancer Staging Cancer of central portion of left breast [facility]) Staging form: Breast, AJCC 8th Edition - Clinical stage from 05/03/2017: Stage IA (cT1a, cN0, cM0, G1, ER: Positive, PR: Positive, HER2: Negative) - Signed by [name], MD on 05/07/2017 - Pathologic stage from 08/22/2017: No Stage Recommended (ypT0, pN0, cM0, GX, ER: Unknown, PR: Unknown, HER2: Unknown) - Signed by [name], MD on 11/06/2017  Cancer of overlapping sites of right female breast [facility]) Staging form: Breast, AJCC 8th Edition - Clinical stage from 04/24/2017: Stage IIA (cT3, cN0, cM0, G2, ER+, PR+, HER2-) - Signed by [name], MD on 11/06/2017 - Pathologic stage from 08/22/2017: No Stage Recommended (ypT3, pN0(i+), cM0, G2, ER+, PR+, HER2-) - Signed by [name], MD on 10/29/2017     Cancer of overlapping sites of right female breast ([place])  03/08/2017 Mammogram   IMPRESSION: 1. Patient has a is palpable mass in the lateral right breast, in the location dense fibroglandular breast tissue. Although no discrete mass is seen sonographically or mammographically, the Clinical change remains concerning. Biopsy is recommended.   RECOMMENDATION: 1. Ultrasound-guided core needle biopsy of palpable abnormality in the lateral right breast.   04/24/2017 Breast US   IMPRESSION: Ultrasound guided biopsy of the right breast. No  apparent complications.   04/24/2017 Initial Biopsy   Breast, right, needle core biopsy, UOQ, centered at 9:30 o'clock - INVASIVE MAMMARY CARCINOMA. - SEE COMMENT. ER 100% positive PR 100% positive Ki67 10% HER2 FISH positive (ratio 2.10, copy number 3.30)  HER2 IHC was done on the biopsy sample after surgery, and was negative (1+). So the final HER2 is NEGATIVE (determined after surgery)   05/02/2017 Breast MRI   IMPRESSION: 1. 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 2. 4 mm enhancing mass with associated architectural distortion in the central left breast. This is suspicious for additional focus of carcinoma.   05/07/2017 Initial Diagnosis   Cancer of overlapping sites of right female breast ([place])   05/15/2017 Echocardiogram   ECHO 05/15/17  Impressions:  - LVEF 60-65%, normal wall thickness, normal wall motion and GLS   strain, grade 1 DD, normal LV filling pressure, mild MR, normal   LA size, normal IVC.      05/22/2017 Imaging   CT CAP W Contrast 05/22/17 IMPRESSION: 1. No specific CT findings of nodal or other metastatic disease in the chest, abdomen, and pelvis. 2. Bosniak category 87F cyst in the right mid kidney, with mild enhancement along a septation but without nodularity. Unless follow up abdominal scans related to the patient's breast cancer adequately characterize this lesion, renal protocol MRI or CT scan would be recommended in 6 months time. 3. Other imaging findings of potential clinical significance: The Aortic Atherosclerosis (ICD10-I70.0). Ectatic ascending thoracic aorta without aneurysm. Small type 1 hiatal hernia. Several small hypodense liver lesions are likely benign although technically too small to characterize. Nonobstructive 1.2 cm  right renal calculus. Anterior uterine fundal myometrial mass favoring fibroid. Pelvic floor laxity with small cystocele. Notable  spondylosis at L3-4 and L5-S1.    05/22/2017 Imaging   Bone Scan Whole Body 05/22/17 IMPRESSION: 1. No findings of osseous metastatic disease. 2. Lower lumbar activity corresponds to areas of significant benign spondylosis on the CT scan.   05/24/2017 - 09/05/2017 Chemotherapy   TCHP every 3 weeks for 6 cycles starting 05/24/17 followed by a year of maintenance Herceptin and Perjeta. Given lack of response she stopped TCHP after 4 cycles on 07/26/17.  She will proceeded with Maintenance Herceptin and Perjeta on 08/16/17 and 09/05/17 only due to hesurgical smaple being HER2 negative     07/12/2017 - 07/14/2017 Hospital Admission   Admit date: 07/12/17 Admission diagnosis: GI Hemorrhaging  Additional comments: Colonoscopy and EGD done with no evidence of bleeding site. Given blood transfusion on 07/15/17   08/02/2017 Imaging   MRI Breast Bilateral 2/16//18 IMPRESSION: 1. The known lobular carcinoma of the right breast has not significantly changed since the prior MRI from 05/02/2017. The disease spans approximately 5.6 cm, previously 5.9 cm in the anterior to posterior dimension.  2. 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  3.  No new suspicious findings in either breast.  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   08/16/2017 -  Anti-estrogen oral therapy   Letrozole once daily starting 08/16/17    08/22/2017 Surgery    RIGHT MASTECTOMY WITH SENTINEL LYMPH NODE BIOPSY ERAS PATHWAY by Dr. Georgette Dover   08/22/2017 Pathology Results   See the oncology history under left breast cancer   09/25/2017 - 11/05/2017 Radiation Therapy   She has completed bilateral adjuvant radiation 09/25/17-11/05/17 managed by Dr. Isidore Moos. She tolerated well.   12/17/2017 Imaging   CT AP W Contrast  12/17/17 IMPRESSION: 1. Stable bilateral renal cysts. No change since prior examination and no worrisome CT imaging features. Stable lower pole right renal cyst. 2. No acute abdominal/pelvic findings or lymphadenopathy. 3. Stable small hepatic cysts. 4. Stable uterine fibroids.   01/22/2018 PET scan   PET 01/22/18 IMPRESSION: 1. Status post right mastectomy and left breast lumpectomy. 2. Minimal left axillary scarring changes near a surgical clip. No findings suspicious for chest wall tumor or axillary or supraclavicular adenopathy. 3. No findings for metastatic disease involving the lungs, abdomen/pelvis or osseous structures   03/21/2018 - 12/30/2019 Chemotherapy   Zometa injections for osteopenia every 6 months for 2 years 03/21/18-12/30/19   03/21/2018 - 12/30/2019 Anti-estrogen oral therapy   -Zometa injections every 6 months for 2 years 03/21/18-12/30/19   Cancer of central portion of left breast ([place])  05/02/2017 Breast MRI   IMPRESSION: 1. 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 2. 4 mm enhancing mass with associated architectural distortion in the central left breast. This is suspicious for additional focus of carcinoma.   05/03/2017 Mammogram   IMPRESSION: 1. Suspicious area of architectural distortion in the left breast which corresponds to a small enhancing mass on MRI.   RECOMMENDATION: Stereotactic core needle biopsy of the area of architectural distortion in the left breast.     05/03/2017 Breast US   Stereotactic core needle biopsy of the area of architectural distortion in the left breast.  05/03/2017 Initial Biopsy   Breast, left, needle core biopsy, central, slightly toward the lower, outer quadrant - INVASIVE DUCTAL CARCINOMA, MSBR GRADE 1/2. - SEE MICROSCOPIC DESCRIPTION. ER 50% positive PR 90% positive Ki67: 1% HER2 negative    05/07/2017 Initial Diagnosis   Cancer of central  portion of left breast ([place])   08/22/2017 Surgery   LEFT BREAST LUMPECTOMY WITH RADIOACTIVE SEED AND SENTINEL LYMPH NODE BIOPSY ERAS PATHWAY by Dr. Georgette Dover 08/22/17    08/22/2017 Pathology Results   Diagnosis 08/22/17 1. Breast, lumpectomy, Left - ATYPICAL DUCTAL HYPERPLASIA - FIBROCYSTIC CHANGES - PREVIOUS BIOPSY SITE CHANGES - SEE COMMENT 2. Lymph node, sentinel, biopsy, a total of 4 nodes, all negative for malignant cells  6. Breast, simple mastectomy, Right - INVASIVE LOBULAR CARCINOMA, [place] GRADE 2/3, 6.0 CM - LOBULAR NEOPLASIA (ATYPICAL LOBULAR HYPERPLASIA) - PREVIOUS BIOPSY SITE CHANGES 7. 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  The Hereditary Gene Panel offered by [facility] includes sequencing and/or deletion duplication testing of the following 47 genes: APC, ATM, AXIN2, BARD1, BMPR1A, BRCA1, BRCA2, BRIP1, CDH1, CDK4, CDKN2A (p14ARF), CDKN2A (p16INK4a), CHEK2, CTNNA1, DICER1, EPCAM (Deletion/duplication testing only), GREM1 (promoter region deletion/duplication testing only), KIT, MEN1, MLH1, MSH2, MSH3, MSH6, MUTYH, NBN, NF1, NHTL1, PALB2, PDGFRA, PMS2, POLD1, POLE, PTEN, RAD50, RAD51C, RAD51D, SDHB, SDHC, SDHD, SMAD4, SMARCA4. STK11, TP53, TSC1, TSC2, and VHL.  The following genes were evaluated for sequence changes only: SDHA and HOXB13 c.251G>A variant only. The report date is February 15, 2018.     CURRENT THERAPY: Letrozole, starting 07/2017  INTERVAL HISTORY: Ms. Madison Stokes returns for follow-up as scheduled, last seen by me 10/18/2021.  Left mammogram and DEXA are scheduled on 06/11/2022.  She is doing well in general without significant changes to her overall health.  Earlier this year in the spring she developed right upper leg tingling that radiated to low back and occasional discomfort.  She saw PCP for  this and will follow-up again next month.  This is triggered by certain positions such as drawing her knees up and turning sideways.  Overall this is getting better.  Denies other bone or joint pain, breast concerns such as new lump/mass, nipple discharge or inversion, or skin change.  Denies unintentional weight loss.  All other systems were reviewed with the patient and are negative.  MEDICAL HISTORY:  Past Medical History:  Diagnosis Date   Anemia    with chemo   Cancer ([place]) 04/2017   Bil Breast Ca   Family history of prostate cancer    Family history of uterine cancer    History of radiation therapy 09/25/17- 11/05/17   Left Breast/ 50 Gy in 25 fractions, right breast/ 50 Gy in 25 fractions, right SCLV/ 45 Gy in 25 fractions, right breast boost/ 10 Gy in 5 fractions.    Hyperlipidemia    Hypertension    Personal history of chemotherapy    Personal history of radiation therapy    PONV (postoperative nausea and vomiting)    nausea only    SURGICAL HISTORY: Past Surgical History:  Procedure Laterality Date   BREAST BIOPSY     BREAST LUMPECTOMY Left    2019   BREAST LUMPECTOMY WITH RADIOACTIVE SEED AND SENTINEL LYMPH NODE BIOPSY Left 08/22/2017   Procedure: LEFT BREAST LUMPECTOMY WITH RADIOACTIVE SEED AND SENTINEL LYMPH NODE  BIOPSY ERAS PATHWAY;  Surgeon: [name], MD;  Location: [place];  Service: General;  Laterality: Left;  PECTORAL BLOCK   COLONOSCOPY WITH PROPOFOL Left 07/14/2017   Procedure: COLONOSCOPY WITH PROPOFOL;  Surgeon: Ronnette Juniper, MD;  Location: [facility];  Service: Gastroenterology;  Laterality: Left;   DILATION AND CURETTAGE OF UTERUS     ESOPHAGOGASTRODUODENOSCOPY N/A 07/13/2017   Procedure: ESOPHAGOGASTRODUODENOSCOPY (EGD);  Surgeon: Ronnette Juniper, MD;  Location: [facility];  Service: Gastroenterology;  Laterality: N/A;   MASTECTOMY Right    2019   MASTECTOMY W/ SENTINEL NODE BIOPSY Right 08/22/2017   Procedure: RIGHT MASTECTOMY WITH SENTINEL LYMPH NODE  BIOPSY ERAS PATHWAY;  Surgeon: [name], MD;  Location: [place];  Service: General;  Laterality: Right;  PECTORAL BLOCK   PORT-A-CATH REMOVAL Right 11/06/2017   Procedure: REMOVAL PORT-A-CATH;  Surgeon: [name], MD;  Location: [place];  Service: General;  Laterality: Right;   PORTACATH PLACEMENT Right 05/14/2017   Procedure: ULTRASOUND GUIDED PORT PLACEMENT;  Surgeon: [name], MD;  Location: [place];  Service: General;  Laterality: Right;    I have reviewed the social history and family history with the patient and they are unchanged from previous note.  ALLERGIES:  has No Known Allergies.  MEDICATIONS:  Current Outpatient Medications  Medication Sig Dispense Refill   amLODipine (NORVASC) 5 MG tablet Take 5 mg by mouth daily.     aspirin EC 81 MG tablet Take 81 mg by mouth daily.     Biotin 10 MG CAPS Take by mouth.     Calcium Citrate-Vitamin D 500-500 MG-UNIT PACK Take 2 tablets by mouth daily.     irbesartan (AVAPRO) 300 MG tablet Take 300 mg by mouth daily.     letrozole (FEMARA) 2.5 MG tablet Take 1 tablet (2.5 mg total) by mouth daily. 90 tablet 3   Multiple Vitamin (MULTIVITAMIN) tablet Take 1 tablet daily by mouth.     rosuvastatin (CRESTOR) 20 MG tablet Take 20 mg by mouth once a week.     VITAMIN B COMPLEX-C PO Take by mouth.     No current facility-administered medications for this visit.    PHYSICAL EXAMINATION: ECOG PERFORMANCE STATUS: 1 - Symptomatic but completely ambulatory  Vitals:   04/18/22 1008  BP: 134/87  Pulse: 79  Resp: 16  Temp: 97.8 F (36.6 C)  SpO2: 98%   Filed Weights   04/18/22 1008  Weight: 129 lb 3.2 oz (58.6 kg)    GENERAL:alert, no distress and comfortable SKIN: No rash EYES: sclera clear NECK: Without mass LYMPH:  no palpable cervical or supraclavicular lymphadenopathy l LUNGS: clear with normal breathing effort HEART: regular rate & rhythm, no lower extremity  edema ABDOMEN:abdomen soft, non-tender and normal bowel sounds Musculoskeletal: Nonfocal NEURO: alert & oriented x 3 with fluent speech, no focal motor/sensory deficits Breast exam: No left nipple discharge or inversion.  S/p left lumpectomy and right mastectomy, incisions completely healed.  Scar tissue in the right axilla.  No palpable mass or nodularity in the left breast, right chest wall, or the axilla that I could appreciate.  LABORATORY DATA:  I have reviewed the data as listed    Latest Ref Rng & Units 04/18/2022    9:37 AM 10/18/2021    9:49 AM 04/05/2021    8:43 AM  CBC  WBC 4.0 - 10.5 K/uL 4.2  3.5  4.3   Hemoglobin 12.0 - 15.0 g/dL 14.7  14.0  14.5   Hematocrit 36.0 -  46.0 % 42.3  41.5  42.4   Platelets 150 - 400 K/uL 227  206  228         Latest Ref Rng & Units 04/18/2022    9:37 AM 10/18/2021    9:49 AM 04/05/2021    8:43 AM  CMP  Glucose 70 - 99 mg/dL 144  113  100   BUN 8 - 23 mg/dL _0 Creatinine 0.44 - 1.00 mg/dL 0.87  0.80  0.78   Sodium 135 - 145 mmol/L 143  143  145   Potassium 3.5 - 5.1 mmol/L 3.6  3.8  4.2   Chloride 98 - 111 mmol/L 106  108  108   CO2 22 - 32 mmol/L _1 Calcium 8.9 - 10.3 mg/dL 9.4  9.5  10.2   Total Protein 6.5 - 8.1 g/dL 6.9  6.7  7.6   Total Bilirubin 0.3 - 1.2 mg/dL 0.6  0.5  0.7   Alkaline Phos 38 - 126 U/L 84  85  81   AST 15 - 41 U/L 24  27  34   ALT 0 - 44 U/L 25  26  36       RADIOGRAPHIC STUDIES: I have personally reviewed the radiological images as listed and agreed with the findings in the report. No results found.   ASSESSMENT & PLAN: 76 y.o. caucasian postmenopausal woman    1.  Cancer of overlapping sites of right breast, invasive lobular carcinoma, Stage IB cT3, cN0, cM0, G2; ER positive, PR positive, HER2 negative (initially diagnosed as positive) by IHC, ypT3N1a 2.  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  S/p neoadjuvant TCHP and 1 year maintenance Herceptin/Perjeta, right mastectomy left breast lumpectomy and bilateral adjuvant radiation -She started antiestrogen therapy with letrozole in 07/2017, plan for a total of 7 years due to lobular histology.  She is tolerating well -CT AP 08/09/2020 showed no evidence of cancer recurrence -Ms. Ramthun is clinically doing well.  Tolerating letrozole without significant side effects.  Physical exam is benign, labs are normal.  No clinical concern for breast cancer recurrence. -Continue surveillance and letrozole -Next follow-up in 6 months, or sooner if needed   2. Genetics -Negative for pathogenic mutation   3. Bosniak category 19F cyst in the right mid kidney, 1.2cm Non-obstructive kidney stone -Stable on last CT, no further work-up warranted   4. Bone health -Her DEXA 01/2018 showed osteopenia with lowest T score -2 at AP spine -She completed 2 years of Zometa in 03/14/2018 - 01/12/2020 -DEXA 04/2020 improved -Repeat this year with mammogram -Continue calcium, she does not take vitamin D and I recommend to start  Plan: -Labs reviewed -Continue breast cancer surveillance and letrozole.  She will let me know her new pharmacy once she changes -Left mammo and DEXA scheduled 06/11/2022, will call with results -Surveillance visit in 6 months, or sooner if needed -F/up PCP for right leg tingling   All questions were answered. The patient knows to call the clinic with any problems, questions or concerns. No barriers to learning were detected.     [name], NP 04/18/22

## 2022-04-18 ENCOUNTER — Encounter: Payer: Self-pay | Admitting: Nurse Practitioner

## 2022-04-18 ENCOUNTER — Inpatient Hospital Stay (HOSPITAL_BASED_OUTPATIENT_CLINIC_OR_DEPARTMENT_OTHER): Payer: Medicare Other | Admitting: Nurse Practitioner

## 2022-04-18 ENCOUNTER — Inpatient Hospital Stay: Payer: Medicare Other | Attending: Nurse Practitioner

## 2022-04-18 VITALS — BP 134/87 | HR 79 | Temp 97.8°F | Resp 16 | Ht 62.0 in | Wt 129.2 lb

## 2022-04-18 DIAGNOSIS — Z79899 Other long term (current) drug therapy: Secondary | ICD-10-CM | POA: Diagnosis not present

## 2022-04-18 DIAGNOSIS — C50112 Malignant neoplasm of central portion of left female breast: Secondary | ICD-10-CM | POA: Diagnosis not present

## 2022-04-18 DIAGNOSIS — C50811 Malignant neoplasm of overlapping sites of right female breast: Secondary | ICD-10-CM | POA: Diagnosis not present

## 2022-04-18 DIAGNOSIS — Z170 Estrogen receptor positive status [ER+]: Secondary | ICD-10-CM | POA: Insufficient documentation

## 2022-04-18 DIAGNOSIS — M8580 Other specified disorders of bone density and structure, unspecified site: Secondary | ICD-10-CM | POA: Insufficient documentation

## 2022-04-18 LAB — CBC WITH DIFFERENTIAL (CANCER CENTER ONLY)
Abs Immature Granulocytes: 0.02 10*3/uL (ref 0.00–0.07)
Basophils Absolute: 0.1 10*3/uL (ref 0.0–0.1)
Basophils Relative: 1 %
Eosinophils Absolute: 0.1 10*3/uL (ref 0.0–0.5)
Eosinophils Relative: 2 %
HCT: 42.3 % (ref 36.0–46.0)
Hemoglobin: 14.7 g/dL (ref 12.0–15.0)
Immature Granulocytes: 1 %
Lymphocytes Relative: 29 %
Lymphs Abs: 1.2 10*3/uL (ref 0.7–4.0)
MCH: 32.9 pg (ref 26.0–34.0)
MCHC: 34.8 g/dL (ref 30.0–36.0)
MCV: 94.6 fL (ref 80.0–100.0)
Monocytes Absolute: 0.5 10*3/uL (ref 0.1–1.0)
Monocytes Relative: 12 %
Neutro Abs: 2.3 10*3/uL (ref 1.7–7.7)
Neutrophils Relative %: 55 %
Platelet Count: 227 10*3/uL (ref 150–400)
RBC: 4.47 MIL/uL (ref 3.87–5.11)
RDW: 13.2 % (ref 11.5–15.5)
WBC Count: 4.2 10*3/uL (ref 4.0–10.5)
nRBC: 0 % (ref 0.0–0.2)

## 2022-04-18 LAB — CMP (CANCER CENTER ONLY)
ALT: 25 U/L (ref 0–44)
AST: 24 U/L (ref 15–41)
Albumin: 4.3 g/dL (ref 3.5–5.0)
Alkaline Phosphatase: 84 U/L (ref 38–126)
Anion gap: 8 (ref 5–15)
BUN: 20 mg/dL (ref 8–23)
CO2: 29 mmol/L (ref 22–32)
Calcium: 9.4 mg/dL (ref 8.9–10.3)
Chloride: 106 mmol/L (ref 98–111)
Creatinine: 0.87 mg/dL (ref 0.44–1.00)
GFR, Estimated: >60 mL/min (ref >60)
Glucose, Bld: 144 mg/dL — ABNORMAL HIGH (ref 70–99)
Potassium: 3.6 mmol/L (ref 3.5–5.1)
Sodium: 143 mmol/L (ref 135–145)
Total Bilirubin: 0.6 mg/dL (ref 0.3–1.2)
Total Protein: 6.9 g/dL (ref 6.5–8.1)

## 2022-04-19 LAB — CANCER ANTIGEN 27.29: CA 27.29: 59 U/mL — ABNORMAL HIGH (ref 0.0–38.6)

## 2022-04-23 ENCOUNTER — Encounter: Payer: Self-pay | Admitting: Nurse Practitioner

## 2022-05-21 DIAGNOSIS — B351 Tinea unguium: Secondary | ICD-10-CM | POA: Diagnosis not present

## 2022-05-21 DIAGNOSIS — C50811 Malignant neoplasm of overlapping sites of right female breast: Secondary | ICD-10-CM | POA: Diagnosis not present

## 2022-05-21 DIAGNOSIS — J301 Allergic rhinitis due to pollen: Secondary | ICD-10-CM | POA: Diagnosis not present

## 2022-05-21 DIAGNOSIS — I10 Essential (primary) hypertension: Secondary | ICD-10-CM | POA: Diagnosis not present

## 2022-05-21 DIAGNOSIS — E119 Type 2 diabetes mellitus without complications: Secondary | ICD-10-CM | POA: Diagnosis not present

## 2022-05-21 DIAGNOSIS — Z0000 Encounter for general adult medical examination without abnormal findings: Secondary | ICD-10-CM | POA: Diagnosis not present

## 2022-05-21 DIAGNOSIS — E7800 Pure hypercholesterolemia, unspecified: Secondary | ICD-10-CM | POA: Diagnosis not present

## 2022-06-11 ENCOUNTER — Ambulatory Visit
Admission: RE | Admit: 2022-06-11 | Discharge: 2022-06-11 | Disposition: A | Discharge status: Home / Self Care | Payer: Medicare Other | Source: Ambulatory Visit | Attending: Nurse Practitioner | Admitting: Nurse Practitioner

## 2022-06-11 DIAGNOSIS — C50112 Malignant neoplasm of central portion of left female breast: Secondary | ICD-10-CM

## 2022-06-11 DIAGNOSIS — C50811 Malignant neoplasm of overlapping sites of right female breast: Secondary | ICD-10-CM

## 2022-06-11 DIAGNOSIS — Z1231 Encounter for screening mammogram for malignant neoplasm of breast: Secondary | ICD-10-CM | POA: Diagnosis not present

## 2022-06-11 DIAGNOSIS — E2839 Other primary ovarian failure: Secondary | ICD-10-CM

## 2022-06-11 DIAGNOSIS — M8589 Other specified disorders of bone density and structure, multiple sites: Secondary | ICD-10-CM | POA: Diagnosis not present

## 2022-06-11 DIAGNOSIS — Z780 Asymptomatic menopausal state: Secondary | ICD-10-CM | POA: Diagnosis not present

## 2022-06-27 DIAGNOSIS — S0181XA Laceration without foreign body of other part of head, initial encounter: Secondary | ICD-10-CM | POA: Diagnosis not present

## 2022-06-27 DIAGNOSIS — Z23 Encounter for immunization: Secondary | ICD-10-CM | POA: Diagnosis not present

## 2022-07-04 DIAGNOSIS — H43813 Vitreous degeneration, bilateral: Secondary | ICD-10-CM | POA: Diagnosis not present

## 2022-07-04 DIAGNOSIS — H04123 Dry eye syndrome of bilateral lacrimal glands: Secondary | ICD-10-CM | POA: Diagnosis not present

## 2022-07-04 DIAGNOSIS — H02831 Dermatochalasis of right upper eyelid: Secondary | ICD-10-CM | POA: Diagnosis not present

## 2022-07-04 DIAGNOSIS — H35372 Puckering of macula, left eye: Secondary | ICD-10-CM | POA: Diagnosis not present

## 2022-07-21 ENCOUNTER — Encounter: Payer: Self-pay | Admitting: Hematology

## 2022-07-23 ENCOUNTER — Other Ambulatory Visit: Payer: Self-pay | Admitting: Nurse Practitioner

## 2022-07-23 ENCOUNTER — Other Ambulatory Visit: Payer: Self-pay | Admitting: *Deleted

## 2022-07-23 DIAGNOSIS — Z170 Estrogen receptor positive status [ER+]: Secondary | ICD-10-CM

## 2022-07-23 MED ORDER — LETROZOLE 2.5 MG PO TABS: 2.5000 mg | ORAL_TABLET | Freq: Every day | ORAL | 3 refills | Status: DC

## 2022-10-14 DIAGNOSIS — M8580 Other specified disorders of bone density and structure, unspecified site: Secondary | ICD-10-CM | POA: Insufficient documentation

## 2022-10-14 NOTE — Assessment & Plan Note (Signed)
-  Her DEXA 01/2018 showed osteopenia with lowest T score -2 at AP spine -She completed 2 years of Zometa in 03/14/2018 - 01/12/2020 -DEXA 05/2022 improved with T score -1.6 -Continue calcium and vitamin D supplement

## 2022-10-14 NOTE — Assessment & Plan Note (Signed)
-  Bilateral stage I breast cancer, ER+/PR+/HER2- Diagnosed right breast cancer 03/2017 and left breast cancer 04/2017.  S/p neoadjuvant TCHP and 1 year maintenance Herceptin/Perjeta, right mastectomy left breast lumpectomy and bilateral adjuvant radiation -She started antiestrogen therapy with letrozole in 07/2017, plan for a total of 7 years due to lobular histology.  She is tolerating well -CT AP 08/09/2020 showed no evidence of cancer recurrence -Madison Stokes is clinically doing well.  Tolerating letrozole without significant side effects.  Physical exam is benign, labs are normal.  No clinical concern for breast cancer recurrence. -Continue surveillance and letrozole -Next follow-up in 6 months, or sooner if needed

## 2022-10-15 ENCOUNTER — Other Ambulatory Visit: Payer: Self-pay

## 2022-10-15 ENCOUNTER — Encounter: Payer: Self-pay | Admitting: Hematology

## 2022-10-15 ENCOUNTER — Inpatient Hospital Stay (HOSPITAL_BASED_OUTPATIENT_CLINIC_OR_DEPARTMENT_OTHER): Payer: Medicare Other | Admitting: Hematology

## 2022-10-15 ENCOUNTER — Inpatient Hospital Stay: Payer: Medicare Other | Attending: Hematology

## 2022-10-15 VITALS — BP 139/85 | HR 77 | Temp 98.3°F | Resp 18 | Ht 62.0 in | Wt 129.4 lb

## 2022-10-15 DIAGNOSIS — Z79899 Other long term (current) drug therapy: Secondary | ICD-10-CM | POA: Insufficient documentation

## 2022-10-15 DIAGNOSIS — Z923 Personal history of irradiation: Secondary | ICD-10-CM | POA: Diagnosis not present

## 2022-10-15 DIAGNOSIS — C50112 Malignant neoplasm of central portion of left female breast: Secondary | ICD-10-CM

## 2022-10-15 DIAGNOSIS — N951 Menopausal and female climacteric states: Secondary | ICD-10-CM | POA: Insufficient documentation

## 2022-10-15 DIAGNOSIS — M2550 Pain in unspecified joint: Secondary | ICD-10-CM | POA: Insufficient documentation

## 2022-10-15 DIAGNOSIS — Z1231 Encounter for screening mammogram for malignant neoplasm of breast: Secondary | ICD-10-CM | POA: Diagnosis not present

## 2022-10-15 DIAGNOSIS — Z79811 Long term (current) use of aromatase inhibitors: Secondary | ICD-10-CM | POA: Insufficient documentation

## 2022-10-15 DIAGNOSIS — Z9011 Acquired absence of right breast and nipple: Secondary | ICD-10-CM | POA: Insufficient documentation

## 2022-10-15 DIAGNOSIS — M8580 Other specified disorders of bone density and structure, unspecified site: Secondary | ICD-10-CM

## 2022-10-15 DIAGNOSIS — Z170 Estrogen receptor positive status [ER+]: Secondary | ICD-10-CM | POA: Diagnosis not present

## 2022-10-15 DIAGNOSIS — C50811 Malignant neoplasm of overlapping sites of right female breast: Secondary | ICD-10-CM | POA: Diagnosis not present

## 2022-10-15 LAB — CBC WITH DIFFERENTIAL (CANCER CENTER ONLY)
Abs Immature Granulocytes: 0.02 10*3/uL (ref 0.00–0.07)
Basophils Absolute: 0.1 10*3/uL (ref 0.0–0.1)
Basophils Relative: 2 %
Eosinophils Absolute: 0.2 10*3/uL (ref 0.0–0.5)
Eosinophils Relative: 5 %
HCT: 43.5 % (ref 36.0–46.0)
Hemoglobin: 14.6 g/dL (ref 12.0–15.0)
Immature Granulocytes: 0 %
Lymphocytes Relative: 27 %
Lymphs Abs: 1.2 10*3/uL (ref 0.7–4.0)
MCH: 31.9 pg (ref 26.0–34.0)
MCHC: 33.6 g/dL (ref 30.0–36.0)
MCV: 95 fL (ref 80.0–100.0)
Monocytes Absolute: 0.4 10*3/uL (ref 0.1–1.0)
Monocytes Relative: 9 %
Neutro Abs: 2.6 10*3/uL (ref 1.7–7.7)
Neutrophils Relative %: 57 %
Platelet Count: 226 10*3/uL (ref 150–400)
RBC: 4.58 MIL/uL (ref 3.87–5.11)
RDW: 13.6 % (ref 11.5–15.5)
WBC Count: 4.6 10*3/uL (ref 4.0–10.5)
nRBC: 0 % (ref 0.0–0.2)

## 2022-10-15 LAB — CMP (CANCER CENTER ONLY)
ALT: 29 U/L (ref 0–44)
AST: 25 U/L (ref 15–41)
Albumin: 4.4 g/dL (ref 3.5–5.0)
Alkaline Phosphatase: 85 U/L (ref 38–126)
Anion gap: 6 (ref 5–15)
BUN: 21 mg/dL (ref 8–23)
CO2: 30 mmol/L (ref 22–32)
Calcium: 9.8 mg/dL (ref 8.9–10.3)
Chloride: 106 mmol/L (ref 98–111)
Creatinine: 0.86 mg/dL (ref 0.44–1.00)
GFR, Estimated: >60 mL/min (ref >60)
Glucose, Bld: 140 mg/dL — ABNORMAL HIGH (ref 70–99)
Potassium: 4 mmol/L (ref 3.5–5.1)
Sodium: 142 mmol/L (ref 135–145)
Total Bilirubin: 0.5 mg/dL (ref 0.3–1.2)
Total Protein: 7.2 g/dL (ref 6.5–8.1)

## 2022-10-15 NOTE — Progress Notes (Signed)
[facility]   [phone] [phone]   Clinic Follow up Note   Patient Care Team: Johny Blamer, MD as PCP - General (Family Medicine) Rennis Golden Lisette Abu, MD as PCP - Cardiology (Cardiology) Malachy Mood, MD as Consulting Physician (Hematology) Pollyann Samples, NP as Nurse Practitioner (Nurse Practitioner) Manus Rudd, MD as Consulting Physician (General Surgery) Loa Socks, NP as Nurse Practitioner (Hematology and Oncology)  Date of Service:  10/15/2022  CHIEF COMPLAINT: f/u of bilateral breast cancer   CURRENT THERAPY:  Letrozole 07/2017  ASSESSMENT:  Madison Stokes is a 77 y.o. female with   Cancer of central portion of left breast (HCC) -Bilateral stage I breast cancer, ER+/PR+/HER2- Diagnosed right breast cancer 03/2017 and left breast cancer 04/2017.  S/p neoadjuvant TCHP and 1 year maintenance Herceptin/Perjeta, right mastectomy left breast lumpectomy and bilateral adjuvant radiation -She started antiestrogen therapy with letrozole in 07/2017, plan for a total of 7-10 years due to lobular histology.  She is tolerating well -CT AP 08/09/2020 showed no evidence of cancer recurrence -[name] is clinically doing well.  Tolerating letrozole without significant side effects.  Physical exam is benign, labs are normal.  No clinical concern for breast cancer recurrence. -Continue surveillance and letrozole, she is tolerating well. -It has been 5 and half year since her initial diagnosis, we will continue to see her once a year to 10 years.  Osteopenia -Her DEXA 01/2018 showed osteopenia with lowest T score -2 at AP spine -She completed 2 years of Zometa in 03/14/2018 - 01/12/2020 -DEXA 05/2022 improved with T score -1.6 -Continue calcium and vitamin D supplement    PLAN: -lab reviewed -CMP- pending. -continue with Letrozole  -lab and f/u with NP Lacie in one year   SUMMARY OF ONCOLOGIC HISTORY: Oncology History Overview  Note  Cancer Staging Cancer of central portion of left breast (HCC) Staging form: Breast, AJCC 8th Edition - Clinical stage from 05/03/2017: Stage IA (cT1a, cN0, cM0, G1, ER: Positive, PR: Positive, HER2: Negative) - Signed by Malachy Mood, MD on 05/07/2017 - Pathologic stage from 08/22/2017: No Stage Recommended (ypT0, pN0, cM0, GX, ER: Unknown, PR: Unknown, HER2: Unknown) - Signed by Malachy Mood, MD on 11/06/2017  Cancer of overlapping sites of right female breast [facility]) Staging form: Breast, AJCC 8th Edition - Clinical stage from 04/24/2017: Stage IIA (cT3, cN0, cM0, G2, ER+, PR+, HER2-) - Signed by Malachy Mood, MD on 11/06/2017 - Pathologic stage from 08/22/2017: No Stage Recommended (ypT3, pN0(i+), cM0, G2, ER+, PR+, HER2-) - Signed by Malachy Mood, MD on 10/29/2017     Cancer of overlapping sites of right female breast  03/08/2017 Mammogram   IMPRESSION: 1. Patient has a is palpable mass in the lateral right breast, in the location dense fibroglandular breast tissue. Although no discrete mass is seen sonographically or mammographically, the Clinical change remains concerning. Biopsy is recommended.   RECOMMENDATION: 1. Ultrasound-guided core needle biopsy of palpable abnormality in the lateral right breast.   04/24/2017 Breast US   IMPRESSION: Ultrasound guided biopsy of the right breast. No apparent complications.   04/24/2017 Initial Biopsy   Breast, right, needle core biopsy, UOQ, centered at 9:30 o'clock - INVASIVE MAMMARY CARCINOMA. - SEE COMMENT. ER 100% positive PR 100% positive Ki67 10% HER2 FISH positive (ratio 2.10, copy number 3.30)  HER2 IHC was done on the biopsy sample after surgery, and was negative (1+). So the final HER2 is NEGATIVE (determined after surgery)   05/02/2017 Breast MRI  IMPRESSION: 1. 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 2. 4 mm enhancing mass with associated architectural  distortion in the central left breast. This is suspicious for additional focus of carcinoma.   05/07/2017 Initial Diagnosis   Cancer of overlapping sites of right female breast (HCC)   05/15/2017 Echocardiogram   ECHO 05/15/17  Impressions:  - LVEF 60-65%, normal wall thickness, normal wall motion and GLS   strain, grade 1 DD, normal LV filling pressure, mild MR, normal   LA size, normal IVC.      05/22/2017 Imaging   CT CAP W Contrast 05/22/17 IMPRESSION: 1. No specific CT findings of nodal or other metastatic disease in the chest, abdomen, and pelvis. 2. Bosniak category 39F cyst in the right mid kidney, with mild enhancement along a septation but without nodularity. Unless follow up abdominal scans related to the patient's breast cancer adequately characterize this lesion, renal protocol MRI or CT scan would be recommended in 6 months time. 3. Other imaging findings of potential clinical significance: The Aortic Atherosclerosis (ICD10-I70.0). Ectatic ascending thoracic aorta without aneurysm. Small type 1 hiatal hernia. Several small hypodense liver lesions are likely benign although technically too small to characterize. Nonobstructive 1.2 cm right renal calculus. Anterior uterine fundal myometrial mass favoring fibroid. Pelvic floor laxity with small cystocele. Notable spondylosis at L3-4 and L5-S1.    05/22/2017 Imaging   Bone Scan Whole Body 05/22/17 IMPRESSION: 1. No findings of osseous metastatic disease. 2. Lower lumbar activity corresponds to areas of significant benign spondylosis on the CT scan.   05/24/2017 - 09/05/2017 Chemotherapy   TCHP every 3 weeks for 6 cycles starting 05/24/17 followed by a year of maintenance Herceptin and Perjeta. Given lack of response she stopped TCHP after 4 cycles on 07/26/17.  She will proceeded with Maintenance Herceptin and Perjeta on 08/16/17 and 09/05/17 only due to hesurgical smaple being HER2 negative     07/12/2017 -  07/14/2017 Hospital Admission   Admit date: 07/12/17 Admission diagnosis: GI Hemorrhaging  Additional comments: Colonoscopy and EGD done with no evidence of bleeding site. Given blood transfusion on 07/15/17   08/02/2017 Imaging   MRI Breast Bilateral 2/16//18 IMPRESSION: 1. The known lobular carcinoma of the right breast has not significantly changed since the prior MRI from 05/02/2017. The disease spans approximately 5.6 cm, previously 5.9 cm in the anterior to posterior dimension.  2. 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  3.  No new suspicious findings in either breast.  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   08/16/2017 -  Anti-estrogen oral therapy   Letrozole once daily starting 08/16/17    08/22/2017 Surgery    RIGHT MASTECTOMY WITH SENTINEL LYMPH NODE BIOPSY ERAS PATHWAY by Dr. Corliss Skains   08/22/2017 Pathology Results   See the oncology history under left breast cancer   09/25/2017 - 11/05/2017 Radiation Therapy   She has completed bilateral adjuvant radiation 09/25/17-11/05/17 managed by Dr. Basilio Cairo. She tolerated well.   12/17/2017 Imaging   CT AP W Contrast 12/17/17 IMPRESSION: 1. Stable bilateral renal cysts. No change since prior examination and no worrisome CT imaging features. Stable lower pole right renal cyst. 2. No acute abdominal/pelvic findings or lymphadenopathy. 3. Stable small hepatic cysts. 4. Stable uterine  fibroids.   01/22/2018 PET scan   PET 01/22/18 IMPRESSION: 1. Status post right mastectomy and left breast lumpectomy. 2. Minimal left axillary scarring changes near a surgical clip. No findings suspicious for chest wall tumor or axillary or supraclavicular adenopathy. 3. No findings for metastatic disease involving the lungs, abdomen/pelvis or  osseous structures   03/21/2018 - 12/30/2019 Chemotherapy   Zometa injections for osteopenia every 6 months for 2 years 03/21/18-12/30/19   03/21/2018 - 12/30/2019 Anti-estrogen oral therapy   -Zometa injections every 6 months for 2 years 03/21/18-12/30/19   Cancer of central portion of left breast  05/02/2017 Breast MRI   IMPRESSION: 1. 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 2. 4 mm enhancing mass with associated architectural distortion in the central left breast. This is suspicious for additional focus of carcinoma.   05/03/2017 Mammogram   IMPRESSION: 1. Suspicious area of architectural distortion in the left breast which corresponds to a small enhancing mass on MRI.   RECOMMENDATION: Stereotactic core needle biopsy of the area of architectural distortion in the left breast.     05/03/2017 Breast US   Stereotactic core needle biopsy of the area of architectural distortion in the left breast.   05/03/2017 Initial Biopsy   Breast, left, needle core biopsy, central, slightly toward the lower, outer quadrant - INVASIVE DUCTAL CARCINOMA, MSBR GRADE 1/2. - SEE MICROSCOPIC DESCRIPTION. ER 50% positive PR 90% positive Ki67: 1% HER2 negative    05/07/2017 Initial Diagnosis   Cancer of central portion of left breast (HCC)   08/22/2017 Surgery   LEFT BREAST LUMPECTOMY WITH RADIOACTIVE SEED AND SENTINEL LYMPH NODE BIOPSY ERAS PATHWAY by Dr. Corliss Skains 08/22/17    08/22/2017 Pathology Results   Diagnosis 08/22/17 1. Breast, lumpectomy, Left - ATYPICAL DUCTAL HYPERPLASIA - FIBROCYSTIC CHANGES - PREVIOUS BIOPSY SITE CHANGES - SEE COMMENT 2. Lymph node, sentinel, biopsy, a total of 4 nodes, all negative for malignant cells  6. Breast, simple mastectomy, Right - INVASIVE LOBULAR CARCINOMA, [place] GRADE 2/3, 6.0 CM - LOBULAR NEOPLASIA (ATYPICAL LOBULAR HYPERPLASIA) - PREVIOUS BIOPSY SITE CHANGES 7. 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  The Hereditary Gene Panel offered by [facility] includes sequencing and/or deletion duplication testing of the following 47 genes: APC, ATM, AXIN2, BARD1, BMPR1A, BRCA1, BRCA2, BRIP1, CDH1, CDK4, CDKN2A (p14ARF), CDKN2A (p16INK4a), CHEK2, CTNNA1, DICER1, EPCAM (Deletion/duplication testing only), GREM1 (promoter region deletion/duplication testing only), KIT, MEN1, MLH1, MSH2, MSH3, MSH6, MUTYH, NBN, NF1, NHTL1, PALB2, PDGFRA, PMS2, POLD1, POLE, PTEN, RAD50, RAD51C, RAD51D, SDHB, SDHC, SDHD, SMAD4, SMARCA4. STK11, TP53, TSC1, TSC2, and VHL.  The following genes were evaluated for sequence changes only: SDHA and HOXB13 c.251G>A variant only. The report date is February 15, 2018.      INTERVAL HISTORY:  RILEE KNOLL is here for a follow up of  bilateral breast cancer .She was last seen by NP Lacie on 04/18/2022. She presents to the clinic alone. Pt report she is doing well, she has some joint pain and some hot flashes. Pt state when she take Tylenol the pain goes away and she had this intermittent pain for 2 years.      All other systems were reviewed with the patient and are negative.  MEDICAL HISTORY:  Past  Medical History:  Diagnosis Date   Anemia    with chemo   Cancer 04/2017   Bil Breast Ca   Family history of prostate cancer    Family history of uterine cancer    History of radiation therapy 09/25/17- 11/05/17   Left Breast/ 50 Gy in 25 fractions, right breast/ 50 Gy in 25 fractions, right SCLV/ 45 Gy in 25 fractions, right breast boost/ 10 Gy in 5 fractions.    Hyperlipidemia    Hypertension    Personal history of chemotherapy    Personal history of radiation therapy    PONV (postoperative nausea and vomiting)    nausea only    SURGICAL HISTORY: Past Surgical History:  Procedure  Laterality Date   BREAST BIOPSY     BREAST LUMPECTOMY Left    2019   BREAST LUMPECTOMY WITH RADIOACTIVE SEED AND SENTINEL LYMPH NODE BIOPSY Left 08/22/2017   Procedure: LEFT BREAST LUMPECTOMY WITH RADIOACTIVE SEED AND SENTINEL LYMPH NODE BIOPSY ERAS PATHWAY;  Surgeon: Manus Rudd, MD;  Location: [facility] OR;  Service: General;  Laterality: Left;  PECTORAL BLOCK   COLONOSCOPY WITH PROPOFOL Left 07/14/2017   Procedure: COLONOSCOPY WITH PROPOFOL;  Surgeon: Kerin Salen, MD;  Location: [facility];  Service: Gastroenterology;  Laterality: Left;   DILATION AND CURETTAGE OF UTERUS     ESOPHAGOGASTRODUODENOSCOPY N/A 07/13/2017   Procedure: ESOPHAGOGASTRODUODENOSCOPY (EGD);  Surgeon: Kerin Salen, MD;  Location: [facility];  Service: Gastroenterology;  Laterality: N/A;   MASTECTOMY Right    2019   MASTECTOMY W/ SENTINEL NODE BIOPSY Right 08/22/2017   Procedure: RIGHT MASTECTOMY WITH SENTINEL LYMPH NODE BIOPSY ERAS PATHWAY;  Surgeon: Manus Rudd, MD;  Location: [facility] OR;  Service: General;  Laterality: Right;  PECTORAL BLOCK   PORT-A-CATH REMOVAL Right 11/06/2017   Procedure: REMOVAL PORT-A-CATH;  Surgeon: Manus Rudd, MD;  Location: [facility];  Service: General;  Laterality: Right;   PORTACATH PLACEMENT Right 05/14/2017   Procedure: ULTRASOUND GUIDED PORT PLACEMENT;  Surgeon: Manus Rudd, MD;  Location: [facility];  Service: General;  Laterality: Right;    I have reviewed the social history and family history with the patient and they are unchanged from previous note.  ALLERGIES:  has No Known Allergies.  MEDICATIONS:  Current Outpatient Medications  Medication Sig Dispense Refill   amLODipine (NORVASC) 5 MG tablet Take 5 mg by mouth daily.     aspirin EC 81 MG tablet Take 81 mg by mouth daily.     Biotin 10 MG CAPS Take by mouth.     Calcium Citrate-Vitamin D 500-500 MG-UNIT PACK Take 2 tablets by mouth daily.     irbesartan (AVAPRO) 300 MG tablet Take 300 mg by  mouth daily.     letrozole (FEMARA) 2.5 MG tablet Take 1 tablet (2.5 mg total) by mouth daily. 90 tablet 3   Multiple Vitamin (MULTIVITAMIN) tablet Take 1 tablet daily by mouth.     rosuvastatin (CRESTOR) 20 MG tablet Take 20 mg by mouth once a week.     VITAMIN B COMPLEX-C PO Take by mouth.     No current facility-administered medications for this visit.    PHYSICAL EXAMINATION: ECOG PERFORMANCE STATUS: 0 - Asymptomatic  Vitals:   10/15/22 0936  BP: 139/85  Pulse: 77  Resp: 18  Temp: 98.3 F (36.8 C)  SpO2: 96%   Wt Readings from Last 3 Encounters:  10/15/22 129 lb 6.4 oz (58.7 kg)  04/18/22 129 lb 3.2 oz (58.6 kg)  10/18/21  128 lb 7 oz (58.3 kg)     GENERAL:alert, no distress and comfortable SKIN: skin color normal, no rashes or significant lesions EYES: normal, Conjunctiva are pink and non-injected, sclera clear  NEURO: alert & oriented x 3 with fluent speech a and lips, buccal mucosa, and tongue  NECK:normal(-) supple, thyroid normal size, non-tender, without nodularity LYMPH: (-) no palpable lymphadenopathy in the cervical, axillary  ABDOMEN:(-) abdomen soft, (-) non-tender and (-)normal bowel sounds BREAST: RT MASTECTOMY a little redness, no palpable mass, LT breast no palpable mass, breast exam benign.    LABORATORY DATA:  I have reviewed the data as listed    Latest Ref Rng & Units 10/15/2022    9:05 AM 04/18/2022    9:37 AM 10/18/2021    9:49 AM  CBC  WBC 4.0 - 10.5 K/uL 4.6  4.2  3.5   Hemoglobin 12.0 - 15.0 g/dL 09.8  11.9  14.7   Hematocrit 36.0 - 46.0 % 43.5  42.3  41.5   Platelets 150 - 400 K/uL 226  227  206         Latest Ref Rng & Units 10/15/2022    9:05 AM 04/18/2022    9:37 AM 10/18/2021    9:49 AM  CMP  Glucose 70 - 99 mg/dL 829  562  130   BUN 8 - 23 mg/dL 21  20  19    Creatinine 0.44 - 1.00 mg/dL 8.65  7.84  6.96   Sodium 135 - 145 mmol/L 142  143  143   Potassium 3.5 - 5.1 mmol/L 4.0  3.6  3.8   Chloride 98 - 111 mmol/L 106  106  108    CO2 22 - 32 mmol/L 30  29  30    Calcium 8.9 - 10.3 mg/dL 9.8  9.4  9.5   Total Protein 6.5 - 8.1 g/dL 7.2  6.9  6.7   Total Bilirubin 0.3 - 1.2 mg/dL 0.5  0.6  0.5   Alkaline Phos 38 - 126 U/L 85  84  85   AST 15 - 41 U/L 25  24  27    ALT 0 - 44 U/L 29  25  26        RADIOGRAPHIC STUDIES: I have personally reviewed the radiological images as listed and agreed with the findings in the report. No results found.    No orders of the defined types were placed in this encounter.  All questions were answered. The patient knows to call the clinic with any problems, questions or concerns. No barriers to learning was detected. The total time spent in the appointment was 20 minutes.     Malachy Mood, MD 10/15/2022   Carolin Coy, CMA, am acting as scribe for Malachy Mood, MD.   I have reviewed the above documentation for accuracy and completeness, and I agree with the above.

## 2022-10-16 ENCOUNTER — Encounter: Payer: Self-pay | Admitting: Hematology

## 2022-10-16 LAB — CANCER ANTIGEN 27.29: CA 27.29: 71.9 U/mL — ABNORMAL HIGH (ref 0.0–38.6)

## 2022-11-29 DIAGNOSIS — E119 Type 2 diabetes mellitus without complications: Secondary | ICD-10-CM | POA: Diagnosis not present

## 2022-11-29 DIAGNOSIS — I10 Essential (primary) hypertension: Secondary | ICD-10-CM | POA: Diagnosis not present

## 2022-11-29 DIAGNOSIS — E7800 Pure hypercholesterolemia, unspecified: Secondary | ICD-10-CM | POA: Diagnosis not present

## 2022-11-29 DIAGNOSIS — C50811 Malignant neoplasm of overlapping sites of right female breast: Secondary | ICD-10-CM | POA: Diagnosis not present

## 2022-11-29 DIAGNOSIS — J301 Allergic rhinitis due to pollen: Secondary | ICD-10-CM | POA: Diagnosis not present

## 2023-01-09 ENCOUNTER — Telehealth: Payer: Self-pay

## 2023-01-09 NOTE — Telephone Encounter (Signed)
Faxed a copy of signed orders to second to [facility] for mastectomy product. Received fax confirmation and placed original in the to be scanned file.

## 2023-05-17 DIAGNOSIS — B880 Other acariasis: Secondary | ICD-10-CM | POA: Diagnosis not present

## 2023-05-17 DIAGNOSIS — H0100A Unspecified blepharitis right eye, upper and lower eyelids: Secondary | ICD-10-CM | POA: Diagnosis not present

## 2023-05-17 DIAGNOSIS — H0100B Unspecified blepharitis left eye, upper and lower eyelids: Secondary | ICD-10-CM | POA: Diagnosis not present

## 2023-05-17 DIAGNOSIS — H00011 Hordeolum externum right upper eyelid: Secondary | ICD-10-CM | POA: Diagnosis not present

## 2023-05-31 DIAGNOSIS — H0100A Unspecified blepharitis right eye, upper and lower eyelids: Secondary | ICD-10-CM | POA: Diagnosis not present

## 2023-05-31 DIAGNOSIS — B880 Other acariasis: Secondary | ICD-10-CM | POA: Diagnosis not present

## 2023-05-31 DIAGNOSIS — H0100B Unspecified blepharitis left eye, upper and lower eyelids: Secondary | ICD-10-CM | POA: Diagnosis not present

## 2023-05-31 DIAGNOSIS — H00011 Hordeolum externum right upper eyelid: Secondary | ICD-10-CM | POA: Diagnosis not present

## 2023-06-03 DIAGNOSIS — I10 Essential (primary) hypertension: Secondary | ICD-10-CM | POA: Diagnosis not present

## 2023-06-03 DIAGNOSIS — E119 Type 2 diabetes mellitus without complications: Secondary | ICD-10-CM | POA: Diagnosis not present

## 2023-06-03 DIAGNOSIS — E7800 Pure hypercholesterolemia, unspecified: Secondary | ICD-10-CM | POA: Diagnosis not present

## 2023-06-03 DIAGNOSIS — C50811 Malignant neoplasm of overlapping sites of right female breast: Secondary | ICD-10-CM | POA: Diagnosis not present

## 2023-06-03 DIAGNOSIS — J301 Allergic rhinitis due to pollen: Secondary | ICD-10-CM | POA: Diagnosis not present

## 2023-06-07 ENCOUNTER — Other Ambulatory Visit: Payer: Self-pay | Admitting: Nurse Practitioner

## 2023-06-07 DIAGNOSIS — Z170 Estrogen receptor positive status [ER+]: Secondary | ICD-10-CM

## 2023-06-13 ENCOUNTER — Ambulatory Visit
Admission: RE | Admit: 2023-06-13 | Discharge: 2023-06-13 | Disposition: A | Discharge status: Home / Self Care | Payer: Medicare Other | Source: Ambulatory Visit | Attending: Hematology | Admitting: Hematology

## 2023-06-13 DIAGNOSIS — Z1231 Encounter for screening mammogram for malignant neoplasm of breast: Secondary | ICD-10-CM | POA: Diagnosis not present

## 2023-08-07 DIAGNOSIS — H04123 Dry eye syndrome of bilateral lacrimal glands: Secondary | ICD-10-CM | POA: Diagnosis not present

## 2023-08-07 DIAGNOSIS — H0100A Unspecified blepharitis right eye, upper and lower eyelids: Secondary | ICD-10-CM | POA: Diagnosis not present

## 2023-08-07 DIAGNOSIS — B880 Other acariasis: Secondary | ICD-10-CM | POA: Diagnosis not present

## 2023-08-07 DIAGNOSIS — H0100B Unspecified blepharitis left eye, upper and lower eyelids: Secondary | ICD-10-CM | POA: Diagnosis not present

## 2023-08-07 DIAGNOSIS — H35372 Puckering of macula, left eye: Secondary | ICD-10-CM | POA: Diagnosis not present

## 2023-09-06 ENCOUNTER — Other Ambulatory Visit: Payer: Self-pay

## 2023-09-06 DIAGNOSIS — C50411 Malignant neoplasm of upper-outer quadrant of right female breast: Secondary | ICD-10-CM

## 2023-09-06 DIAGNOSIS — C50112 Malignant neoplasm of central portion of left female breast: Secondary | ICD-10-CM

## 2023-09-08 NOTE — Progress Notes (Unsigned)
 Patient Care Team: Noberto Retort, MD as PCP - General (Family Medicine) Rennis Golden, Lisette Abu, MD as PCP - Cardiology (Cardiology) Malachy Mood, MD as Consulting Physician (Hematology) Pollyann Samples, NP as Nurse Practitioner (Nurse Practitioner) Manus Rudd, MD as Consulting Physician (General Surgery) Axel Filler, Larna Daughters, NP as Nurse Practitioner (Hematology and Oncology)   CHIEF COMPLAINT: Follow up bilateral breast cancer   Oncology History Overview Note  Cancer Staging Cancer of central portion of left breast [facility]) Staging form: Breast, AJCC 8th Edition - Clinical stage from 05/03/2017: Stage IA (cT1a, cN0, cM0, G1, ER: Positive, PR: Positive, HER2: Negative) - Signed by Malachy Mood, MD on 05/07/2017 - Pathologic stage from 08/22/2017: No Stage Recommended (ypT0, pN0, cM0, GX, ER: Unknown, PR: Unknown, HER2: Unknown) - Signed by Malachy Mood, MD on 11/06/2017  Cancer of overlapping sites of right female breast [facility]) Staging form: Breast, AJCC 8th Edition - Clinical stage from 04/24/2017: Stage IIA (cT3, cN0, cM0, G2, ER+, PR+, HER2-) - Signed by Malachy Mood, MD on 11/06/2017 - Pathologic stage from 08/22/2017: No Stage Recommended (ypT3, pN0(i+), cM0, G2, ER+, PR+, HER2-) - Signed by Malachy Mood, MD on 10/29/2017     Cancer of overlapping sites of right female breast (HCC)  03/08/2017 Mammogram   IMPRESSION: 1. Patient has a is palpable mass in the lateral right breast, in the location dense fibroglandular breast tissue. Although no discrete mass is seen sonographically or mammographically, the Clinical change remains concerning. Biopsy is recommended.   RECOMMENDATION: 1. Ultrasound-guided core needle biopsy of palpable abnormality in the lateral right breast.   04/24/2017 Breast US   IMPRESSION: Ultrasound guided biopsy of the right breast. No apparent complications.   04/24/2017 Initial Biopsy   Breast, right, needle core biopsy, UOQ, centered at 9:30 o'clock - INVASIVE  MAMMARY CARCINOMA. - SEE COMMENT. ER 100% positive PR 100% positive Ki67 10% HER2 FISH positive (ratio 2.10, copy number 3.30)  HER2 IHC was done on the biopsy sample after surgery, and was negative (1+). So the final HER2 is NEGATIVE (determined after surgery)   05/02/2017 Breast MRI   IMPRESSION: 1. 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 2. 4 mm enhancing mass with associated architectural distortion in the central left breast. This is suspicious for additional focus of carcinoma.   05/07/2017 Initial Diagnosis   Cancer of overlapping sites of right female breast (HCC)   05/15/2017 Echocardiogram   ECHO 05/15/17  Impressions:  - LVEF 60-65%, normal wall thickness, normal wall motion and GLS   strain, grade 1 DD, normal LV filling pressure, mild MR, normal   LA size, normal IVC.      05/22/2017 Imaging   CT CAP W Contrast 05/22/17 IMPRESSION: 1. No specific CT findings of nodal or other metastatic disease in the chest, abdomen, and pelvis. 2. Bosniak category 32F cyst in the right mid kidney, with mild enhancement along a septation but without nodularity. Unless follow up abdominal scans related to the patient's breast cancer adequately characterize this lesion, renal protocol MRI or CT scan would be recommended in 6 months time. 3. Other imaging findings of potential clinical significance: The Aortic Atherosclerosis (ICD10-I70.0). Ectatic ascending thoracic aorta without aneurysm. Small type 1 hiatal hernia. Several small hypodense liver lesions are likely benign although technically too small to characterize. Nonobstructive 1.2 cm right renal calculus. Anterior uterine fundal myometrial mass favoring fibroid. Pelvic floor laxity with small cystocele. Notable  spondylosis at L3-4 and L5-S1.    05/22/2017 Imaging   Bone Scan Whole Body 05/22/17 IMPRESSION: 1. No findings of osseous  metastatic disease. 2. Lower lumbar activity corresponds to areas of significant benign spondylosis on the CT scan.   05/24/2017 - 09/05/2017 Chemotherapy   TCHP every 3 weeks for 6 cycles starting 05/24/17 followed by a year of maintenance Herceptin and Perjeta. Given lack of response she stopped TCHP after 4 cycles on 07/26/17.  She will proceeded with Maintenance Herceptin and Perjeta on 08/16/17 and 09/05/17 only due to hesurgical smaple being HER2 negative     07/12/2017 - 07/14/2017 Hospital Admission   Admit date: 07/12/17 Admission diagnosis: GI Hemorrhaging  Additional comments: Colonoscopy and EGD done with no evidence of bleeding site. Given blood transfusion on 07/15/17   08/02/2017 Imaging   MRI Breast Bilateral 2/16//18 IMPRESSION: 1. The known lobular carcinoma of the right breast has not significantly changed since the prior MRI from 05/02/2017. The disease spans approximately 5.6 cm, previously 5.9 cm in the anterior to posterior dimension.  2. 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  3.  No new suspicious findings in either breast.  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   08/16/2017 -  Anti-estrogen oral therapy   Letrozole once daily starting 08/16/17    08/22/2017 Surgery    RIGHT MASTECTOMY WITH SENTINEL LYMPH NODE BIOPSY ERAS PATHWAY by Dr. Corliss Skains   08/22/2017 Pathology Results   See the oncology history under left breast cancer   09/25/2017 - 11/05/2017 Radiation Therapy   She has completed bilateral adjuvant radiation 09/25/17-11/05/17 managed by Dr. Basilio Cairo. She tolerated well.   12/17/2017 Imaging   CT AP W Contrast 12/17/17 IMPRESSION: 1. Stable bilateral renal cysts. No change since prior examination and no worrisome CT imaging features. Stable lower pole  right renal cyst. 2. No acute abdominal/pelvic findings or lymphadenopathy. 3. Stable small hepatic cysts. 4. Stable uterine fibroids.   01/22/2018 PET scan   PET 01/22/18 IMPRESSION: 1. Status post right mastectomy and left breast lumpectomy. 2. Minimal left axillary scarring changes near a surgical clip. No findings suspicious for chest wall tumor or axillary or supraclavicular adenopathy. 3. No findings for metastatic disease involving the lungs, abdomen/pelvis or osseous structures   03/21/2018 - 12/30/2019 Chemotherapy   Zometa injections for osteopenia every 6 months for 2 years 03/21/18-12/30/19   03/21/2018 - 12/30/2019 Anti-estrogen oral therapy   -Zometa injections every 6 months for 2 years 03/21/18-12/30/19   Cancer of central portion of left breast (HCC)  05/02/2017 Breast MRI   IMPRESSION: 1. 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 2. 4 mm enhancing mass with associated architectural distortion in the central left breast. This is suspicious for additional focus of carcinoma.   05/03/2017 Mammogram   IMPRESSION: 1. Suspicious area of architectural distortion in the left breast which corresponds to a small enhancing mass on MRI.   RECOMMENDATION: Stereotactic core needle biopsy of the area of architectural distortion in the left breast.     05/03/2017 Breast US   Stereotactic core needle biopsy of the area of architectural distortion in the left breast.   05/03/2017 Initial Biopsy   Breast, left, needle core biopsy, central, slightly toward the lower, outer  quadrant - INVASIVE DUCTAL CARCINOMA, MSBR GRADE 1/2. - SEE MICROSCOPIC DESCRIPTION. ER 50% positive PR 90% positive Ki67: 1% HER2 negative    05/07/2017 Initial Diagnosis   Cancer of central portion of left breast (HCC)   08/22/2017 Surgery   LEFT BREAST LUMPECTOMY WITH RADIOACTIVE SEED AND SENTINEL LYMPH NODE BIOPSY ERAS PATHWAY by  Dr. Corliss Skains 08/22/17    08/22/2017 Pathology Results   Diagnosis 08/22/17 1. Breast, lumpectomy, Left - ATYPICAL DUCTAL HYPERPLASIA - FIBROCYSTIC CHANGES - PREVIOUS BIOPSY SITE CHANGES - SEE COMMENT 2. Lymph node, sentinel, biopsy, a total of 4 nodes, all negative for malignant cells  6. Breast, simple mastectomy, Right - INVASIVE LOBULAR CARCINOMA, [place] GRADE 2/3, 6.0 CM - LOBULAR NEOPLASIA (ATYPICAL LOBULAR HYPERPLASIA) - PREVIOUS BIOPSY SITE CHANGES 7. 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  The Hereditary Gene Panel offered by [facility] includes sequencing and/or deletion duplication testing of the following 47 genes: APC, ATM, AXIN2, BARD1, BMPR1A, BRCA1, BRCA2, BRIP1, CDH1, CDK4, CDKN2A (p14ARF), CDKN2A (p16INK4a), CHEK2, CTNNA1, DICER1, EPCAM (Deletion/duplication testing only), GREM1 (promoter region deletion/duplication testing only), KIT, MEN1, MLH1, MSH2, MSH3, MSH6, MUTYH, NBN, NF1, NHTL1, PALB2, PDGFRA, PMS2, POLD1, POLE, PTEN, RAD50, RAD51C, RAD51D, SDHB, SDHC, SDHD, SMAD4, SMARCA4. STK11, TP53, TSC1, TSC2, and VHL.  The following genes were evaluated for sequence changes only: SDHA and HOXB13 c.251G>A variant only. The report date is February 15, 2018.      CURRENT THERAPY: Letrozole, starting 07/2017  INTERVAL HISTORY Madison Stokes returns for follow up as scheduled. Last seen by Dr. Mosetta Putt 11/2022.   ROS   Past Medical History:  Diagnosis Date   Anemia    with chemo   Cancer (HCC) 04/2017   Bil Breast Ca   Family history of prostate cancer    Family history of uterine cancer    History of radiation therapy 09/25/17- 11/05/17   Left Breast/ 50 Gy in 25 fractions, right breast/ 50 Gy in 25 fractions, right SCLV/ 45 Gy in 25 fractions, right breast boost/ 10 Gy in 5 fractions.    Hyperlipidemia     Hypertension    Personal history of chemotherapy    Personal history of radiation therapy    PONV (postoperative nausea and vomiting)    nausea only     Past Surgical History:  Procedure Laterality Date   BREAST BIOPSY     BREAST LUMPECTOMY Left    2019   BREAST LUMPECTOMY WITH RADIOACTIVE SEED AND SENTINEL LYMPH NODE BIOPSY Left 08/22/2017   Procedure: LEFT BREAST LUMPECTOMY WITH RADIOACTIVE SEED AND SENTINEL LYMPH NODE BIOPSY ERAS PATHWAY;  Surgeon: Manus Rudd, MD;  Location: [facility];  Service: General;  Laterality: Left;  PECTORAL BLOCK   COLONOSCOPY WITH PROPOFOL Left 07/14/2017   Procedure: COLONOSCOPY WITH PROPOFOL;  Surgeon: Kerin Salen, MD;  Location: [facility];  Service: Gastroenterology;  Laterality: Left;   DILATION AND CURETTAGE OF UTERUS     ESOPHAGOGASTRODUODENOSCOPY N/A 07/13/2017   Procedure: ESOPHAGOGASTRODUODENOSCOPY (EGD);  Surgeon: Kerin Salen, MD;  Location: [facility];  Service: Gastroenterology;  Laterality: N/A;   MASTECTOMY Right    2019   MASTECTOMY W/ SENTINEL NODE BIOPSY Right 08/22/2017   Procedure: RIGHT MASTECTOMY WITH SENTINEL LYMPH NODE BIOPSY ERAS PATHWAY;  Surgeon: Manus Rudd, MD;  Location: [facility];  Service: General;  Laterality: Right;  PECTORAL BLOCK   PORT-A-CATH REMOVAL Right 11/06/2017   Procedure: REMOVAL PORT-A-CATH;  Surgeon: Manus Rudd, MD;  Location: [facility];  Service: General;  Laterality: Right;   PORTACATH PLACEMENT Right 05/14/2017   Procedure: ULTRASOUND GUIDED PORT PLACEMENT;  Surgeon: Manus Rudd, MD;  Location: [facility];  Service: General;  Laterality: Right;     Outpatient Encounter Medications as of 09/09/2023  Medication Sig   amLODipine (NORVASC) 5 MG tablet Take 5 mg by mouth daily.   aspirin EC 81 MG tablet Take 81 mg by mouth daily.   Biotin 10 MG CAPS Take by mouth.   Calcium Citrate-Vitamin D 500-500 MG-UNIT PACK Take 2 tablets by mouth daily.   irbesartan (AVAPRO) 300 MG  tablet Take 300 mg by mouth daily.   letrozole (FEMARA) 2.5 MG tablet TAKE 1 TABLET DAILY   Multiple Vitamin (MULTIVITAMIN) tablet Take 1 tablet daily by mouth.   rosuvastatin (CRESTOR) 20 MG tablet Take 20 mg by mouth once a week.   VITAMIN B COMPLEX-C PO Take by mouth.   [DISCONTINUED] prochlorperazine (COMPAZINE) 10 MG tablet Take 1 tablet (10 mg total) every 6 (six) hours as needed by mouth (Nausea or vomiting).   No facility-administered encounter medications on file as of 09/09/2023.     There were no vitals filed for this visit. There is no height or weight on file to calculate BMI.   PHYSICAL EXAM GENERAL:alert, no distress and comfortable SKIN: no rash  EYES: sclera clear NECK: without mass LYMPH:  no palpable cervical or supraclavicular lymphadenopathy  LUNGS: clear with normal breathing effort HEART: regular rate & rhythm, no lower extremity edema ABDOMEN: abdomen soft, non-tender and normal bowel sounds NEURO: alert & oriented x 3 with fluent speech, no focal motor/sensory deficits Breast exam:  PAC without erythema    CBC    Component Value Date/Time   WBC 4.6 10/15/2022 0905   WBC 3.0 (L) 03/21/2018 0907   RBC 4.58 10/15/2022 0905   HGB 14.6 10/15/2022 0905   HGB 12.5 06/13/2017 1307   HCT 43.5 10/15/2022 0905   HCT 37.2 06/13/2017 1307   PLT 226 10/15/2022 0905   PLT 289 06/13/2017 1307   MCV 95.0 10/15/2022 0905   MCV 93.5 06/13/2017 1307   MCH 31.9 10/15/2022 0905   MCHC 33.6 10/15/2022 0905   RDW 13.6 10/15/2022 0905   RDW 13.8 06/13/2017 1307   LYMPHSABS 1.2 10/15/2022 0905   LYMPHSABS 0.4 (L) 06/13/2017 1307   MONOABS 0.4 10/15/2022 0905   MONOABS 0.0 (L) 06/13/2017 1307   EOSABS 0.2 10/15/2022 0905   EOSABS 0.0 06/13/2017 1307   BASOSABS 0.1 10/15/2022 0905   BASOSABS 0.0 06/13/2017 1307     CMP     Component Value Date/Time   NA 142 10/15/2022 0905   NA 142 06/13/2017 1307   K 4.0 10/15/2022 0905   K 4.1 06/13/2017 1307   CL 106  10/15/2022 0905   CO2 30 10/15/2022 0905   CO2 23 06/13/2017 1307   GLUCOSE 140 (H) 10/15/2022 0905   GLUCOSE 224 (H) 06/13/2017 1307   BUN 21 10/15/2022 0905   BUN 17.3 06/13/2017 1307   CREATININE 0.86 10/15/2022 0905   CREATININE 0.9 06/13/2017 1307   CALCIUM 9.8 10/15/2022 0905   CALCIUM 8.9 06/13/2017 1307   PROT 7.2 10/15/2022 0905   PROT 6.4 06/13/2017 1307   ALBUMIN 4.4 10/15/2022 0905   ALBUMIN 3.5 06/13/2017 1307   AST 25 10/15/2022 0905  AST 31 06/13/2017 1307   ALT 29 10/15/2022 0905   ALT 50 06/13/2017 1307   ALKPHOS 85 10/15/2022 0905   ALKPHOS 124 06/13/2017 1307   BILITOT 0.5 10/15/2022 0905   BILITOT 0.24 06/13/2017 1307   GFRNONAA >60 10/15/2022 0905   GFRAA >60 12/30/2019 0848     ASSESSMENT & PLAN: 78 y.o. caucasian postmenopausal woman    1.  Cancer of overlapping sites of right breast, invasive lobular carcinoma, Stage IB cT3, cN0, cM0, G2; ER positive, PR positive, HER2 negative (initially diagnosed as positive) by IHC, ypT3N1a 2.  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  S/p neoadjuvant TCHP and 1 year maintenance Herceptin/Perjeta, right mastectomy left breast lumpectomy and bilateral adjuvant radiation -She started antiestrogen therapy with letrozole in 07/2017, plan for a total of 7 years due to lobular histology.  She is tolerating well -CT AP 08/09/2020 showed no evidence of cancer recurrence   2. Genetics -Negative for pathogenic mutation   3. Bosniak category 12F cyst in the right mid kidney, 1.2cm Non-obstructive kidney stone -Stable on last CT, no further work-up warranted   4. Bone health -Her DEXA 01/2018 showed osteopenia with lowest T score -2 at AP spine -She completed 2 years of Zometa in 03/14/2018 - 01/12/2020 -DEXA 04/2020 improved -Repeat this year with  mammogram -Continue calcium, she does not take vitamin D and I recommend to start     PLAN:  No orders of the defined types were placed in this encounter.     All questions were answered. The patient knows to call the clinic with any problems, questions or concerns. No barriers to learning were detected. I spent *** counseling the patient face to face. The total time spent in the appointment was *** and more than 50% was on counseling, review of test results, and coordination of care.   Madison Glad, NP-C @DATE @

## 2023-09-09 ENCOUNTER — Encounter: Payer: Self-pay | Admitting: Nurse Practitioner

## 2023-09-09 ENCOUNTER — Inpatient Hospital Stay: Payer: Medicare Other | Attending: Nurse Practitioner

## 2023-09-09 ENCOUNTER — Inpatient Hospital Stay (HOSPITAL_BASED_OUTPATIENT_CLINIC_OR_DEPARTMENT_OTHER): Payer: Medicare Other | Admitting: Nurse Practitioner

## 2023-09-09 VITALS — BP 141/79 | HR 76 | Temp 97.9°F | Resp 17 | Wt 128.5 lb

## 2023-09-09 DIAGNOSIS — Z9011 Acquired absence of right breast and nipple: Secondary | ICD-10-CM | POA: Insufficient documentation

## 2023-09-09 DIAGNOSIS — Z170 Estrogen receptor positive status [ER+]: Secondary | ICD-10-CM | POA: Insufficient documentation

## 2023-09-09 DIAGNOSIS — M8580 Other specified disorders of bone density and structure, unspecified site: Secondary | ICD-10-CM | POA: Diagnosis not present

## 2023-09-09 DIAGNOSIS — Z1721 Progesterone receptor positive status: Secondary | ICD-10-CM | POA: Diagnosis not present

## 2023-09-09 DIAGNOSIS — E2839 Other primary ovarian failure: Secondary | ICD-10-CM

## 2023-09-09 DIAGNOSIS — C50411 Malignant neoplasm of upper-outer quadrant of right female breast: Secondary | ICD-10-CM

## 2023-09-09 DIAGNOSIS — C50112 Malignant neoplasm of central portion of left female breast: Secondary | ICD-10-CM | POA: Insufficient documentation

## 2023-09-09 DIAGNOSIS — Z9221 Personal history of antineoplastic chemotherapy: Secondary | ICD-10-CM | POA: Insufficient documentation

## 2023-09-09 DIAGNOSIS — C50811 Malignant neoplasm of overlapping sites of right female breast: Secondary | ICD-10-CM

## 2023-09-09 DIAGNOSIS — Z1231 Encounter for screening mammogram for malignant neoplasm of breast: Secondary | ICD-10-CM | POA: Diagnosis not present

## 2023-09-09 DIAGNOSIS — Z1732 Human epidermal growth factor receptor 2 negative status: Secondary | ICD-10-CM | POA: Insufficient documentation

## 2023-09-09 DIAGNOSIS — N281 Cyst of kidney, acquired: Secondary | ICD-10-CM | POA: Insufficient documentation

## 2023-09-09 LAB — CBC WITH DIFFERENTIAL (CANCER CENTER ONLY)
Abs Immature Granulocytes: 0.02 10*3/uL (ref 0.00–0.07)
Basophils Absolute: 0.1 10*3/uL (ref 0.0–0.1)
Basophils Relative: 2 %
Eosinophils Absolute: 0.2 10*3/uL (ref 0.0–0.5)
Eosinophils Relative: 4 %
HCT: 42.5 % (ref 36.0–46.0)
Hemoglobin: 14.5 g/dL (ref 12.0–15.0)
Immature Granulocytes: 0 %
Lymphocytes Relative: 27 %
Lymphs Abs: 1.4 10*3/uL (ref 0.7–4.0)
MCH: 31.3 pg (ref 26.0–34.0)
MCHC: 34.1 g/dL (ref 30.0–36.0)
MCV: 91.6 fL (ref 80.0–100.0)
Monocytes Absolute: 0.7 10*3/uL (ref 0.1–1.0)
Monocytes Relative: 14 %
Neutro Abs: 2.7 10*3/uL (ref 1.7–7.7)
Neutrophils Relative %: 53 %
Platelet Count: 223 10*3/uL (ref 150–400)
RBC: 4.64 MIL/uL (ref 3.87–5.11)
RDW: 13.2 % (ref 11.5–15.5)
WBC Count: 5.1 10*3/uL (ref 4.0–10.5)
nRBC: 0 % (ref 0.0–0.2)

## 2023-09-09 LAB — CMP (CANCER CENTER ONLY)
ALT: 23 U/L (ref 0–44)
AST: 27 U/L (ref 15–41)
Albumin: 4.5 g/dL (ref 3.5–5.0)
Alkaline Phosphatase: 87 U/L (ref 38–126)
Anion gap: 5 (ref 5–15)
BUN: 26 mg/dL — ABNORMAL HIGH (ref 8–23)
CO2: 30 mmol/L (ref 22–32)
Calcium: 9.5 mg/dL (ref 8.9–10.3)
Chloride: 108 mmol/L (ref 98–111)
Creatinine: 1.16 mg/dL — ABNORMAL HIGH (ref 0.44–1.00)
GFR, Estimated: 49 mL/min — ABNORMAL LOW (ref >60)
Glucose, Bld: 113 mg/dL — ABNORMAL HIGH (ref 70–99)
Potassium: 3.9 mmol/L (ref 3.5–5.1)
Sodium: 143 mmol/L (ref 135–145)
Total Bilirubin: 0.6 mg/dL (ref 0.0–1.2)
Total Protein: 7.3 g/dL (ref 6.5–8.1)

## 2023-09-10 LAB — CANCER ANTIGEN 27.29: CA 27.29: 82.6 U/mL — ABNORMAL HIGH (ref 0.0–38.6)

## 2023-09-11 ENCOUNTER — Encounter: Payer: Self-pay | Admitting: Nurse Practitioner

## 2023-12-16 DIAGNOSIS — I10 Essential (primary) hypertension: Secondary | ICD-10-CM | POA: Diagnosis not present

## 2023-12-16 DIAGNOSIS — J301 Allergic rhinitis due to pollen: Secondary | ICD-10-CM | POA: Diagnosis not present

## 2023-12-16 DIAGNOSIS — E7800 Pure hypercholesterolemia, unspecified: Secondary | ICD-10-CM | POA: Diagnosis not present

## 2023-12-16 DIAGNOSIS — Z0000 Encounter for general adult medical examination without abnormal findings: Secondary | ICD-10-CM | POA: Diagnosis not present

## 2023-12-16 DIAGNOSIS — E119 Type 2 diabetes mellitus without complications: Secondary | ICD-10-CM | POA: Diagnosis not present

## 2023-12-16 DIAGNOSIS — C50811 Malignant neoplasm of overlapping sites of right female breast: Secondary | ICD-10-CM | POA: Diagnosis not present

## 2024-01-07 ENCOUNTER — Other Ambulatory Visit: Payer: Self-pay

## 2024-01-07 ENCOUNTER — Other Ambulatory Visit

## 2024-01-07 DIAGNOSIS — Z170 Estrogen receptor positive status [ER+]: Secondary | ICD-10-CM

## 2024-01-07 DIAGNOSIS — C50911 Malignant neoplasm of unspecified site of right female breast: Secondary | ICD-10-CM

## 2024-01-08 ENCOUNTER — Inpatient Hospital Stay: Attending: Nurse Practitioner

## 2024-01-08 DIAGNOSIS — C50112 Malignant neoplasm of central portion of left female breast: Secondary | ICD-10-CM | POA: Diagnosis not present

## 2024-01-08 DIAGNOSIS — Z1721 Progesterone receptor positive status: Secondary | ICD-10-CM | POA: Diagnosis not present

## 2024-01-08 DIAGNOSIS — Z170 Estrogen receptor positive status [ER+]: Secondary | ICD-10-CM | POA: Insufficient documentation

## 2024-01-08 DIAGNOSIS — C50811 Malignant neoplasm of overlapping sites of right female breast: Secondary | ICD-10-CM | POA: Insufficient documentation

## 2024-01-08 DIAGNOSIS — Z1732 Human epidermal growth factor receptor 2 negative status: Secondary | ICD-10-CM | POA: Diagnosis not present

## 2024-01-08 DIAGNOSIS — C50911 Malignant neoplasm of unspecified site of right female breast: Secondary | ICD-10-CM

## 2024-01-08 LAB — CMP (CANCER CENTER ONLY)
ALT: 23 U/L (ref 0–44)
AST: 26 U/L (ref 15–41)
Albumin: 4.3 g/dL (ref 3.5–5.0)
Alkaline Phosphatase: 85 U/L (ref 38–126)
Anion gap: 6 (ref 5–15)
BUN: 20 mg/dL (ref 8–23)
CO2: 29 mmol/L (ref 22–32)
Calcium: 9.7 mg/dL (ref 8.9–10.3)
Chloride: 106 mmol/L (ref 98–111)
Creatinine: 0.91 mg/dL (ref 0.44–1.00)
GFR, Estimated: >60 mL/min (ref >60)
Glucose, Bld: 108 mg/dL — ABNORMAL HIGH (ref 70–99)
Potassium: 4.1 mmol/L (ref 3.5–5.1)
Sodium: 141 mmol/L (ref 135–145)
Total Bilirubin: 0.6 mg/dL (ref 0.0–1.2)
Total Protein: 7.3 g/dL (ref 6.5–8.1)

## 2024-01-08 LAB — CBC WITH DIFFERENTIAL (CANCER CENTER ONLY)
Abs Immature Granulocytes: 0.01 K/uL (ref 0.00–0.07)
Basophils Absolute: 0.1 K/uL (ref 0.0–0.1)
Basophils Relative: 2 %
Eosinophils Absolute: 0.2 K/uL (ref 0.0–0.5)
Eosinophils Relative: 4 %
HCT: 42.9 % (ref 36.0–46.0)
Hemoglobin: 14.8 g/dL (ref 12.0–15.0)
Immature Granulocytes: 0 %
Lymphocytes Relative: 29 %
Lymphs Abs: 1.2 K/uL (ref 0.7–4.0)
MCH: 31.6 pg (ref 26.0–34.0)
MCHC: 34.5 g/dL (ref 30.0–36.0)
MCV: 91.7 fL (ref 80.0–100.0)
Monocytes Absolute: 0.6 K/uL (ref 0.1–1.0)
Monocytes Relative: 14 %
Neutro Abs: 2.1 K/uL (ref 1.7–7.7)
Neutrophils Relative %: 51 %
Platelet Count: 218 K/uL (ref 150–400)
RBC: 4.68 MIL/uL (ref 3.87–5.11)
RDW: 13.5 % (ref 11.5–15.5)
WBC Count: 4.1 K/uL (ref 4.0–10.5)
nRBC: 0 % (ref 0.0–0.2)

## 2024-01-09 LAB — CANCER ANTIGEN 27.29: CA 27.29: 90.6 U/mL — ABNORMAL HIGH (ref 0.0–38.6)

## 2024-01-14 ENCOUNTER — Encounter: Payer: Self-pay | Admitting: Nurse Practitioner

## 2024-01-18 ENCOUNTER — Encounter: Payer: Self-pay | Admitting: Nurse Practitioner

## 2024-01-21 ENCOUNTER — Telehealth: Payer: Self-pay | Admitting: Hematology

## 2024-01-21 NOTE — Telephone Encounter (Signed)
 Scheduled appointment per incoming call from the patient. Talked with the patient and she is aware of the made appointment.

## 2024-01-22 ENCOUNTER — Other Ambulatory Visit: Payer: Self-pay

## 2024-01-22 DIAGNOSIS — Z170 Estrogen receptor positive status [ER+]: Secondary | ICD-10-CM

## 2024-01-22 NOTE — Progress Notes (Signed)
 As per Dr. Lanny, order was placed in portal for [employer] and paperwork was uploaded with order. Kit was placed in lab to be drawn on 08/13.

## 2024-02-05 ENCOUNTER — Inpatient Hospital Stay: Attending: Nurse Practitioner

## 2024-02-05 DIAGNOSIS — C50811 Malignant neoplasm of overlapping sites of right female breast: Secondary | ICD-10-CM | POA: Diagnosis not present

## 2024-02-05 DIAGNOSIS — Z170 Estrogen receptor positive status [ER+]: Secondary | ICD-10-CM

## 2024-02-20 ENCOUNTER — Encounter: Payer: Self-pay | Admitting: Hematology

## 2024-02-21 LAB — GUARDANT REVEAL

## 2024-04-29 DIAGNOSIS — Z23 Encounter for immunization: Secondary | ICD-10-CM | POA: Diagnosis not present

## 2024-04-29 DIAGNOSIS — H6123 Impacted cerumen, bilateral: Secondary | ICD-10-CM | POA: Diagnosis not present

## 2024-06-03 DIAGNOSIS — Z23 Encounter for immunization: Secondary | ICD-10-CM | POA: Diagnosis not present

## 2024-06-03 DIAGNOSIS — I10 Essential (primary) hypertension: Secondary | ICD-10-CM | POA: Diagnosis not present

## 2024-06-03 DIAGNOSIS — C50811 Malignant neoplasm of overlapping sites of right female breast: Secondary | ICD-10-CM | POA: Diagnosis not present

## 2024-06-03 DIAGNOSIS — E7800 Pure hypercholesterolemia, unspecified: Secondary | ICD-10-CM | POA: Diagnosis not present

## 2024-06-03 DIAGNOSIS — J301 Allergic rhinitis due to pollen: Secondary | ICD-10-CM | POA: Diagnosis not present

## 2024-06-03 DIAGNOSIS — E119 Type 2 diabetes mellitus without complications: Secondary | ICD-10-CM | POA: Diagnosis not present

## 2024-06-03 DIAGNOSIS — R3915 Urgency of urination: Secondary | ICD-10-CM | POA: Diagnosis not present

## 2024-06-10 ENCOUNTER — Other Ambulatory Visit: Payer: Self-pay | Admitting: Nurse Practitioner

## 2024-06-10 DIAGNOSIS — C50811 Malignant neoplasm of overlapping sites of right female breast: Secondary | ICD-10-CM

## 2024-06-15 ENCOUNTER — Ambulatory Visit

## 2024-06-15 ENCOUNTER — Other Ambulatory Visit

## 2024-06-15 DIAGNOSIS — C50112 Malignant neoplasm of central portion of left female breast: Secondary | ICD-10-CM

## 2024-06-15 DIAGNOSIS — Z1231 Encounter for screening mammogram for malignant neoplasm of breast: Secondary | ICD-10-CM

## 2024-07-15 ENCOUNTER — Encounter (HOSPITAL_BASED_OUTPATIENT_CLINIC_OR_DEPARTMENT_OTHER): Payer: Self-pay

## 2024-07-16 ENCOUNTER — Other Ambulatory Visit (HOSPITAL_BASED_OUTPATIENT_CLINIC_OR_DEPARTMENT_OTHER)

## 2024-09-15 ENCOUNTER — Other Ambulatory Visit

## 2024-09-15 ENCOUNTER — Ambulatory Visit: Admitting: Nurse Practitioner
